# Patient Record
Sex: Female | Born: 1935 | Race: White | Hispanic: No | Smoking: Never smoker
Health system: Southern US, Community
[De-identification: ages and names within clinical notes are randomized; demographics above are authoritative.]

## PROBLEM LIST (undated history)

## (undated) DIAGNOSIS — K5730 Diverticulosis of large intestine without perforation or abscess without bleeding: Secondary | ICD-10-CM

## (undated) DIAGNOSIS — N259 Disorder resulting from impaired renal tubular function, unspecified: Secondary | ICD-10-CM

## (undated) DIAGNOSIS — IMO0001 Reserved for inherently not codable concepts without codable children: Secondary | ICD-10-CM

## (undated) DIAGNOSIS — D649 Anemia, unspecified: Secondary | ICD-10-CM

## (undated) DIAGNOSIS — F411 Generalized anxiety disorder: Secondary | ICD-10-CM

## (undated) DIAGNOSIS — M1990 Unspecified osteoarthritis, unspecified site: Secondary | ICD-10-CM

## (undated) DIAGNOSIS — N319 Neuromuscular dysfunction of bladder, unspecified: Secondary | ICD-10-CM

## (undated) DIAGNOSIS — Z86718 Personal history of other venous thrombosis and embolism: Secondary | ICD-10-CM

## (undated) DIAGNOSIS — Z8672 Personal history of thrombophlebitis: Secondary | ICD-10-CM

## (undated) DIAGNOSIS — I739 Peripheral vascular disease, unspecified: Secondary | ICD-10-CM

## (undated) DIAGNOSIS — K219 Gastro-esophageal reflux disease without esophagitis: Secondary | ICD-10-CM

## (undated) DIAGNOSIS — M899 Disorder of bone, unspecified: Secondary | ICD-10-CM

## (undated) DIAGNOSIS — C801 Malignant (primary) neoplasm, unspecified: Secondary | ICD-10-CM

## (undated) DIAGNOSIS — I10 Essential (primary) hypertension: Secondary | ICD-10-CM

## (undated) DIAGNOSIS — K222 Esophageal obstruction: Secondary | ICD-10-CM

## (undated) DIAGNOSIS — K589 Irritable bowel syndrome without diarrhea: Secondary | ICD-10-CM

## (undated) DIAGNOSIS — D759 Disease of blood and blood-forming organs, unspecified: Secondary | ICD-10-CM

## (undated) DIAGNOSIS — K449 Diaphragmatic hernia without obstruction or gangrene: Secondary | ICD-10-CM

## (undated) DIAGNOSIS — I7300 Raynaud's syndrome without gangrene: Secondary | ICD-10-CM

## (undated) DIAGNOSIS — J45909 Unspecified asthma, uncomplicated: Secondary | ICD-10-CM

## (undated) DIAGNOSIS — M949 Disorder of cartilage, unspecified: Secondary | ICD-10-CM

## (undated) DIAGNOSIS — C8599 Non-Hodgkin lymphoma, unspecified, extranodal and solid organ sites: Secondary | ICD-10-CM

## (undated) DIAGNOSIS — E7800 Pure hypercholesterolemia, unspecified: Secondary | ICD-10-CM

## (undated) DIAGNOSIS — M5030 Other cervical disc degeneration, unspecified cervical region: Secondary | ICD-10-CM

## (undated) DIAGNOSIS — D682 Hereditary deficiency of other clotting factors: Secondary | ICD-10-CM

## (undated) HISTORY — DX: Esophageal obstruction: K22.2

## (undated) HISTORY — PX: TONSILLECTOMY: SUR1361

## (undated) HISTORY — DX: Personal history of thrombophlebitis: Z86.72

## (undated) HISTORY — DX: Pure hypercholesterolemia, unspecified: E78.00

## (undated) HISTORY — DX: Irritable bowel syndrome, unspecified: K58.9

## (undated) HISTORY — DX: Essential (primary) hypertension: I10

## (undated) HISTORY — DX: Hereditary deficiency of other clotting factors: D68.2

## (undated) HISTORY — DX: Personal history of other venous thrombosis and embolism: Z86.718

## (undated) HISTORY — PX: CATARACT EXTRACTION: SUR2

## (undated) HISTORY — DX: Disorder resulting from impaired renal tubular function, unspecified: N25.9

## (undated) HISTORY — DX: Diaphragmatic hernia without obstruction or gangrene: K44.9

## (undated) HISTORY — DX: Diverticulosis of large intestine without perforation or abscess without bleeding: K57.30

## (undated) HISTORY — DX: Raynaud's syndrome without gangrene: I73.00

## (undated) HISTORY — DX: Unspecified osteoarthritis, unspecified site: M19.90

## (undated) HISTORY — DX: Disorder of cartilage, unspecified: M94.9

## (undated) HISTORY — PX: OTHER SURGICAL HISTORY: SHX169

## (undated) HISTORY — DX: Unspecified asthma, uncomplicated: J45.909

## (undated) HISTORY — PX: EYE SURGERY: SHX253

## (undated) HISTORY — DX: Other cervical disc degeneration, unspecified cervical region: M50.30

## (undated) HISTORY — DX: Anemia, unspecified: D64.9

## (undated) HISTORY — PX: CHOLECYSTECTOMY: SHX55

## (undated) HISTORY — DX: Neuromuscular dysfunction of bladder, unspecified: N31.9

## (undated) HISTORY — DX: Gastro-esophageal reflux disease without esophagitis: K21.9

## (undated) HISTORY — DX: Generalized anxiety disorder: F41.1

## (undated) HISTORY — DX: Disorder of bone, unspecified: M89.9

---

## 1997-11-09 ENCOUNTER — Ambulatory Visit (HOSPITAL_COMMUNITY): Admission: RE | Admit: 1997-11-09 | Discharge: 1997-11-09 | Payer: Self-pay | Admitting: Surgery

## 1998-02-08 ENCOUNTER — Other Ambulatory Visit: Admission: RE | Admit: 1998-02-08 | Discharge: 1998-02-08 | Payer: Self-pay | Admitting: Obstetrics & Gynecology

## 1998-03-30 HISTORY — PX: COLONOSCOPY: SHX174

## 1998-08-01 ENCOUNTER — Ambulatory Visit (HOSPITAL_COMMUNITY): Admission: RE | Admit: 1998-08-01 | Discharge: 1998-08-01 | Payer: Self-pay | Admitting: Otolaryngology

## 1999-01-13 DIAGNOSIS — K5730 Diverticulosis of large intestine without perforation or abscess without bleeding: Secondary | ICD-10-CM

## 1999-01-13 HISTORY — DX: Diverticulosis of large intestine without perforation or abscess without bleeding: K57.30

## 1999-02-07 ENCOUNTER — Other Ambulatory Visit: Admission: RE | Admit: 1999-02-07 | Discharge: 1999-02-07 | Payer: Self-pay | Admitting: Obstetrics & Gynecology

## 1999-11-20 ENCOUNTER — Encounter: Payer: Self-pay | Admitting: Obstetrics & Gynecology

## 1999-11-20 ENCOUNTER — Encounter: Admission: RE | Admit: 1999-11-20 | Discharge: 1999-11-20 | Payer: Self-pay | Admitting: Obstetrics & Gynecology

## 2000-03-15 ENCOUNTER — Other Ambulatory Visit: Admission: RE | Admit: 2000-03-15 | Discharge: 2000-03-15 | Payer: Self-pay | Admitting: Obstetrics & Gynecology

## 2001-04-12 ENCOUNTER — Other Ambulatory Visit: Admission: RE | Admit: 2001-04-12 | Discharge: 2001-04-12 | Payer: Self-pay | Admitting: Obstetrics & Gynecology

## 2002-05-08 ENCOUNTER — Other Ambulatory Visit: Admission: RE | Admit: 2002-05-08 | Discharge: 2002-05-08 | Payer: Self-pay | Admitting: Obstetrics & Gynecology

## 2002-08-14 ENCOUNTER — Encounter: Payer: Self-pay | Admitting: Internal Medicine

## 2002-08-14 ENCOUNTER — Ambulatory Visit (HOSPITAL_COMMUNITY): Admission: RE | Admit: 2002-08-14 | Discharge: 2002-08-14 | Payer: Self-pay | Admitting: Internal Medicine

## 2003-06-26 ENCOUNTER — Ambulatory Visit (HOSPITAL_COMMUNITY): Admission: RE | Admit: 2003-06-26 | Discharge: 2003-06-26 | Payer: Self-pay | Admitting: Gastroenterology

## 2003-06-26 ENCOUNTER — Encounter: Payer: Self-pay | Admitting: Gastroenterology

## 2003-07-09 ENCOUNTER — Inpatient Hospital Stay (HOSPITAL_COMMUNITY): Admission: RE | Admit: 2003-07-09 | Discharge: 2003-07-13 | Payer: Self-pay | Admitting: Internal Medicine

## 2004-04-23 ENCOUNTER — Ambulatory Visit: Payer: Self-pay | Admitting: Pulmonary Disease

## 2004-06-18 ENCOUNTER — Other Ambulatory Visit: Admission: RE | Admit: 2004-06-18 | Discharge: 2004-06-18 | Payer: Self-pay | Admitting: Obstetrics & Gynecology

## 2004-07-28 ENCOUNTER — Ambulatory Visit (HOSPITAL_COMMUNITY): Admission: RE | Admit: 2004-07-28 | Discharge: 2004-07-28 | Payer: Self-pay | Admitting: Neurosurgery

## 2004-08-27 ENCOUNTER — Ambulatory Visit: Payer: Self-pay | Admitting: Pulmonary Disease

## 2004-09-04 ENCOUNTER — Ambulatory Visit: Payer: Self-pay | Admitting: Gastroenterology

## 2004-09-16 ENCOUNTER — Ambulatory Visit: Payer: Self-pay

## 2004-09-17 ENCOUNTER — Ambulatory Visit (HOSPITAL_COMMUNITY): Admission: RE | Admit: 2004-09-17 | Discharge: 2004-09-17 | Payer: Self-pay | Admitting: Gastroenterology

## 2004-09-17 ENCOUNTER — Ambulatory Visit: Payer: Self-pay | Admitting: Gastroenterology

## 2004-09-23 ENCOUNTER — Ambulatory Visit: Payer: Self-pay | Admitting: Pulmonary Disease

## 2004-10-06 ENCOUNTER — Ambulatory Visit: Payer: Self-pay | Admitting: Pulmonary Disease

## 2004-10-08 ENCOUNTER — Ambulatory Visit: Payer: Self-pay | Admitting: Internal Medicine

## 2004-10-08 ENCOUNTER — Observation Stay (HOSPITAL_COMMUNITY): Admission: EM | Admit: 2004-10-08 | Discharge: 2004-10-10 | Payer: Self-pay | Admitting: Emergency Medicine

## 2004-10-14 ENCOUNTER — Ambulatory Visit: Payer: Self-pay | Admitting: Gastroenterology

## 2004-10-24 ENCOUNTER — Ambulatory Visit: Payer: Self-pay | Admitting: Gastroenterology

## 2004-10-28 ENCOUNTER — Ambulatory Visit: Payer: Self-pay | Admitting: Internal Medicine

## 2004-10-30 ENCOUNTER — Ambulatory Visit: Payer: Self-pay | Admitting: Internal Medicine

## 2004-11-10 ENCOUNTER — Ambulatory Visit: Payer: Self-pay | Admitting: Pulmonary Disease

## 2004-11-11 ENCOUNTER — Ambulatory Visit: Payer: Self-pay | Admitting: Pulmonary Disease

## 2004-12-08 ENCOUNTER — Ambulatory Visit: Payer: Self-pay | Admitting: Pulmonary Disease

## 2004-12-10 ENCOUNTER — Ambulatory Visit: Payer: Self-pay | Admitting: Cardiology

## 2004-12-10 ENCOUNTER — Inpatient Hospital Stay (HOSPITAL_COMMUNITY): Admission: EM | Admit: 2004-12-10 | Discharge: 2004-12-19 | Payer: Self-pay | Admitting: Emergency Medicine

## 2004-12-11 ENCOUNTER — Ambulatory Visit: Payer: Self-pay | Admitting: Cardiology

## 2004-12-11 ENCOUNTER — Ambulatory Visit: Payer: Self-pay | Admitting: Pulmonary Disease

## 2004-12-11 ENCOUNTER — Encounter: Payer: Self-pay | Admitting: Cardiology

## 2004-12-24 ENCOUNTER — Ambulatory Visit: Payer: Self-pay | Admitting: Pulmonary Disease

## 2005-01-26 ENCOUNTER — Ambulatory Visit: Payer: Self-pay | Admitting: Pulmonary Disease

## 2005-03-11 ENCOUNTER — Ambulatory Visit: Payer: Self-pay | Admitting: Pulmonary Disease

## 2005-05-14 ENCOUNTER — Ambulatory Visit: Payer: Self-pay | Admitting: Pulmonary Disease

## 2005-06-02 ENCOUNTER — Ambulatory Visit: Payer: Self-pay | Admitting: Gastroenterology

## 2005-07-09 ENCOUNTER — Ambulatory Visit: Payer: Self-pay | Admitting: Gastroenterology

## 2005-07-14 ENCOUNTER — Ambulatory Visit: Payer: Self-pay | Admitting: Gastroenterology

## 2005-07-20 ENCOUNTER — Ambulatory Visit: Payer: Self-pay | Admitting: Pulmonary Disease

## 2005-08-11 ENCOUNTER — Ambulatory Visit: Payer: Self-pay | Admitting: Pulmonary Disease

## 2005-08-27 ENCOUNTER — Ambulatory Visit: Payer: Self-pay | Admitting: Gastroenterology

## 2005-09-06 ENCOUNTER — Emergency Department (HOSPITAL_COMMUNITY): Admission: EM | Admit: 2005-09-06 | Discharge: 2005-09-06 | Payer: Self-pay | Admitting: Emergency Medicine

## 2005-09-22 ENCOUNTER — Ambulatory Visit: Payer: Self-pay | Admitting: Gastroenterology

## 2005-10-01 ENCOUNTER — Ambulatory Visit: Payer: Self-pay | Admitting: Gastroenterology

## 2005-11-09 ENCOUNTER — Ambulatory Visit: Payer: Self-pay | Admitting: Pulmonary Disease

## 2005-11-17 ENCOUNTER — Ambulatory Visit: Payer: Self-pay | Admitting: Gastroenterology

## 2006-02-08 ENCOUNTER — Ambulatory Visit: Payer: Self-pay | Admitting: Pulmonary Disease

## 2006-04-26 ENCOUNTER — Ambulatory Visit (HOSPITAL_COMMUNITY): Admission: RE | Admit: 2006-04-26 | Discharge: 2006-04-26 | Payer: Self-pay | Admitting: Internal Medicine

## 2006-05-11 ENCOUNTER — Ambulatory Visit: Payer: Self-pay | Admitting: Pulmonary Disease

## 2006-08-14 ENCOUNTER — Ambulatory Visit (HOSPITAL_COMMUNITY): Admission: RE | Admit: 2006-08-14 | Discharge: 2006-08-14 | Payer: Self-pay | Admitting: Emergency Medicine

## 2006-09-15 ENCOUNTER — Ambulatory Visit: Payer: Self-pay | Admitting: Pulmonary Disease

## 2006-09-15 LAB — CONVERTED CEMR LAB
ALT: 15 units/L (ref 0–40)
AST: 28 units/L (ref 0–37)
Calcium: 9.5 mg/dL (ref 8.4–10.5)
Chloride: 108 meq/L (ref 96–112)
Cholesterol: 165 mg/dL (ref 0–200)
Eosinophils Absolute: 0 10*3/uL (ref 0.0–0.6)
GFR calc Af Amer: 37 mL/min
Glucose, Bld: 112 mg/dL — ABNORMAL HIGH (ref 70–99)
HCT: 33 % — ABNORMAL LOW (ref 36.0–46.0)
LDL Cholesterol: 97 mg/dL (ref 0–99)
Monocytes Relative: 7.9 % (ref 3.0–11.0)
Neutro Abs: 3.1 10*3/uL (ref 1.4–7.7)
Neutrophils Relative %: 67.7 % (ref 43.0–77.0)
Platelets: 301 10*3/uL (ref 150–400)
RDW: 14.3 % (ref 11.5–14.6)
Total Bilirubin: 0.4 mg/dL (ref 0.3–1.2)
Total CHOL/HDL Ratio: 3.4
Triglycerides: 94 mg/dL (ref 0–149)
Uric Acid, Serum: 4.7 mg/dL (ref 2.4–7.0)
WBC: 4.4 10*3/uL — ABNORMAL LOW (ref 4.5–10.5)

## 2006-12-22 ENCOUNTER — Ambulatory Visit (HOSPITAL_COMMUNITY): Admission: RE | Admit: 2006-12-22 | Discharge: 2006-12-22 | Payer: Self-pay | Admitting: Internal Medicine

## 2007-01-10 DIAGNOSIS — M1990 Unspecified osteoarthritis, unspecified site: Secondary | ICD-10-CM | POA: Insufficient documentation

## 2007-01-10 DIAGNOSIS — F411 Generalized anxiety disorder: Secondary | ICD-10-CM | POA: Insufficient documentation

## 2007-01-10 DIAGNOSIS — M949 Disorder of cartilage, unspecified: Secondary | ICD-10-CM

## 2007-01-10 DIAGNOSIS — I10 Essential (primary) hypertension: Secondary | ICD-10-CM | POA: Insufficient documentation

## 2007-01-10 DIAGNOSIS — D649 Anemia, unspecified: Secondary | ICD-10-CM | POA: Insufficient documentation

## 2007-01-10 DIAGNOSIS — M899 Disorder of bone, unspecified: Secondary | ICD-10-CM | POA: Insufficient documentation

## 2007-01-10 DIAGNOSIS — K5730 Diverticulosis of large intestine without perforation or abscess without bleeding: Secondary | ICD-10-CM | POA: Insufficient documentation

## 2007-03-14 ENCOUNTER — Ambulatory Visit: Payer: Self-pay | Admitting: Pulmonary Disease

## 2007-03-14 DIAGNOSIS — N319 Neuromuscular dysfunction of bladder, unspecified: Secondary | ICD-10-CM | POA: Insufficient documentation

## 2007-03-14 DIAGNOSIS — Z8672 Personal history of thrombophlebitis: Secondary | ICD-10-CM

## 2007-03-14 DIAGNOSIS — E7800 Pure hypercholesterolemia, unspecified: Secondary | ICD-10-CM

## 2007-03-14 DIAGNOSIS — J45909 Unspecified asthma, uncomplicated: Secondary | ICD-10-CM | POA: Insufficient documentation

## 2007-03-14 DIAGNOSIS — M5030 Other cervical disc degeneration, unspecified cervical region: Secondary | ICD-10-CM

## 2007-04-12 ENCOUNTER — Telehealth: Payer: Self-pay | Admitting: Pulmonary Disease

## 2007-08-17 ENCOUNTER — Encounter: Admission: RE | Admit: 2007-08-17 | Discharge: 2007-08-17 | Payer: Self-pay | Admitting: Obstetrics & Gynecology

## 2007-08-29 ENCOUNTER — Encounter (INDEPENDENT_AMBULATORY_CARE_PROVIDER_SITE_OTHER): Payer: Self-pay | Admitting: Diagnostic Radiology

## 2007-08-29 ENCOUNTER — Encounter: Admission: RE | Admit: 2007-08-29 | Discharge: 2007-08-29 | Payer: Self-pay | Admitting: Obstetrics & Gynecology

## 2007-09-16 ENCOUNTER — Encounter: Payer: Self-pay | Admitting: Pulmonary Disease

## 2007-09-28 ENCOUNTER — Ambulatory Visit: Payer: Self-pay | Admitting: Pulmonary Disease

## 2007-09-29 ENCOUNTER — Ambulatory Visit: Payer: Self-pay | Admitting: Pulmonary Disease

## 2007-09-30 LAB — CONVERTED CEMR LAB
AST: 30 units/L (ref 0–37)
Albumin: 3.5 g/dL (ref 3.5–5.2)
Alkaline Phosphatase: 48 units/L (ref 39–117)
Basophils Absolute: 0 10*3/uL (ref 0.0–0.1)
Basophils Relative: 0.9 % (ref 0.0–1.0)
Eosinophils Absolute: 0.1 10*3/uL (ref 0.0–0.7)
Eosinophils Relative: 1.4 % (ref 0.0–5.0)
GFR calc Af Amer: 37 mL/min
GFR calc non Af Amer: 31 mL/min
HDL: 44.9 mg/dL (ref >39.0)
Hemoglobin: 10.3 g/dL — ABNORMAL LOW (ref 12.0–15.0)
LDL Cholesterol: 83 mg/dL (ref 0–99)
Monocytes Relative: 10.1 % (ref 3.0–12.0)
Platelets: 237 10*3/uL (ref 150–400)
Potassium: 4.8 meq/L (ref 3.5–5.1)
RDW: 14.8 % — ABNORMAL HIGH (ref 11.5–14.6)
Sodium: 139 meq/L (ref 135–145)
TSH: 1.31 microintl units/mL (ref 0.35–5.50)
Total CHOL/HDL Ratio: 3.4
Triglycerides: 123 mg/dL (ref 0–149)

## 2007-10-31 ENCOUNTER — Encounter: Payer: Self-pay | Admitting: Gastroenterology

## 2007-11-25 ENCOUNTER — Telehealth (INDEPENDENT_AMBULATORY_CARE_PROVIDER_SITE_OTHER): Payer: Self-pay | Admitting: *Deleted

## 2007-12-29 ENCOUNTER — Telehealth: Payer: Self-pay | Admitting: Gastroenterology

## 2008-01-24 ENCOUNTER — Ambulatory Visit: Payer: Self-pay | Admitting: Gastroenterology

## 2008-01-24 DIAGNOSIS — K589 Irritable bowel syndrome without diarrhea: Secondary | ICD-10-CM | POA: Insufficient documentation

## 2008-01-24 LAB — CONVERTED CEMR LAB
Iron: 71 ug/dL (ref 42–145)
Tissue Transglutaminase Ab, IgA: 0.5 units (ref <7)
Vitamin B-12: 382 pg/mL (ref 211–911)

## 2008-02-16 ENCOUNTER — Telehealth: Payer: Self-pay | Admitting: Gastroenterology

## 2008-02-29 ENCOUNTER — Encounter: Admission: RE | Admit: 2008-02-29 | Discharge: 2008-02-29 | Payer: Self-pay | Admitting: Obstetrics & Gynecology

## 2008-04-02 ENCOUNTER — Ambulatory Visit: Payer: Self-pay | Admitting: Pulmonary Disease

## 2008-04-02 DIAGNOSIS — N259 Disorder resulting from impaired renal tubular function, unspecified: Secondary | ICD-10-CM | POA: Insufficient documentation

## 2008-07-03 ENCOUNTER — Telehealth (INDEPENDENT_AMBULATORY_CARE_PROVIDER_SITE_OTHER): Payer: Self-pay | Admitting: *Deleted

## 2008-08-21 ENCOUNTER — Encounter: Admission: RE | Admit: 2008-08-21 | Discharge: 2008-08-21 | Payer: Self-pay | Admitting: Obstetrics & Gynecology

## 2008-10-02 ENCOUNTER — Ambulatory Visit: Payer: Self-pay | Admitting: Pulmonary Disease

## 2008-10-03 LAB — CONVERTED CEMR LAB
ALT: 17 units/L (ref 0–35)
AST: 31 units/L (ref 0–37)
Alkaline Phosphatase: 47 units/L (ref 39–117)
Calcium: 9.3 mg/dL (ref 8.4–10.5)
Chloride: 105 meq/L (ref 96–112)
Creatinine, Ser: 1.8 mg/dL — ABNORMAL HIGH (ref 0.4–1.2)
Eosinophils Absolute: 0.1 10*3/uL (ref 0.0–0.7)
HCT: 32.3 % — ABNORMAL LOW (ref 36.0–46.0)
Lymphocytes Relative: 24.5 % (ref 12.0–46.0)
Monocytes Absolute: 0.4 10*3/uL (ref 0.1–1.0)
Monocytes Relative: 7.4 % (ref 3.0–12.0)
Neutro Abs: 3.5 10*3/uL (ref 1.4–7.7)
Platelets: 233 10*3/uL (ref 150.0–400.0)
Potassium: 4.3 meq/L (ref 3.5–5.1)
RBC: 3.43 M/uL — ABNORMAL LOW (ref 3.87–5.11)
RDW: 14 % (ref 11.5–14.6)
Total Bilirubin: 1 mg/dL (ref 0.3–1.2)
Total CHOL/HDL Ratio: 3
Uric Acid, Serum: 4.3 mg/dL (ref 2.4–7.0)
VLDL: 21.4 mg/dL (ref 0.0–40.0)

## 2008-10-29 ENCOUNTER — Telehealth (INDEPENDENT_AMBULATORY_CARE_PROVIDER_SITE_OTHER): Payer: Self-pay | Admitting: *Deleted

## 2008-10-31 ENCOUNTER — Encounter: Payer: Self-pay | Admitting: Pulmonary Disease

## 2008-12-06 ENCOUNTER — Ambulatory Visit: Payer: Self-pay | Admitting: Pulmonary Disease

## 2008-12-08 LAB — CONVERTED CEMR LAB
Calcium: 9.2 mg/dL (ref 8.4–10.5)
GFR calc non Af Amer: 38.39 mL/min (ref >60)
Glucose, Bld: 95 mg/dL (ref 70–99)

## 2009-05-06 ENCOUNTER — Ambulatory Visit: Payer: Self-pay | Admitting: Pulmonary Disease

## 2009-05-07 LAB — CONVERTED CEMR LAB
ALT: 16 units/L (ref 0–35)
AST: 28 units/L (ref 0–37)
Albumin: 4 g/dL (ref 3.5–5.2)
Alkaline Phosphatase: 52 units/L (ref 39–117)
Basophils Relative: 0.6 % (ref 0.0–3.0)
Bilirubin, Direct: 0.2 mg/dL (ref 0.0–0.3)
Calcium: 10 mg/dL (ref 8.4–10.5)
Eosinophils Relative: 1.4 % (ref 0.0–5.0)
Glucose, Bld: 97 mg/dL (ref 70–99)
Hemoglobin: 11.6 g/dL — ABNORMAL LOW (ref 12.0–15.0)
Lymphocytes Relative: 20.8 % (ref 12.0–46.0)
MCHC: 33.6 g/dL (ref 30.0–36.0)
Monocytes Relative: 7.3 % (ref 3.0–12.0)
Neutro Abs: 4.3 10*3/uL (ref 1.4–7.7)
Neutrophils Relative %: 69.9 % (ref 43.0–77.0)
Platelets: 240 10*3/uL (ref 150.0–400.0)
RDW: 14.5 % (ref 11.5–14.6)
Sodium: 140 meq/L (ref 135–145)
Total Bilirubin: 0.7 mg/dL (ref 0.3–1.2)
Total Protein: 7.6 g/dL (ref 6.0–8.3)

## 2009-06-11 ENCOUNTER — Telehealth (INDEPENDENT_AMBULATORY_CARE_PROVIDER_SITE_OTHER): Payer: Self-pay | Admitting: *Deleted

## 2009-06-26 ENCOUNTER — Telehealth: Payer: Self-pay | Admitting: Pulmonary Disease

## 2009-07-01 ENCOUNTER — Telehealth (INDEPENDENT_AMBULATORY_CARE_PROVIDER_SITE_OTHER): Payer: Self-pay | Admitting: *Deleted

## 2009-11-04 ENCOUNTER — Ambulatory Visit: Payer: Self-pay | Admitting: Pulmonary Disease

## 2009-11-10 LAB — CONVERTED CEMR LAB
BUN: 34 mg/dL — ABNORMAL HIGH (ref 6–23)
Basophils Relative: 0.6 % (ref 0.0–3.0)
Chloride: 106 meq/L (ref 96–112)
Creatinine, Ser: 1.5 mg/dL — ABNORMAL HIGH (ref 0.4–1.2)
Eosinophils Absolute: 0.1 10*3/uL (ref 0.0–0.7)
Eosinophils Relative: 1.2 % (ref 0.0–5.0)
GFR calc non Af Amer: 36.21 mL/min (ref >60)
Glucose, Bld: 103 mg/dL — ABNORMAL HIGH (ref 70–99)
HCT: 35.4 % — ABNORMAL LOW (ref 36.0–46.0)
HDL: 54.7 mg/dL (ref >39.00)
Lymphocytes Relative: 22.1 % (ref 12.0–46.0)
Lymphs Abs: 1.2 10*3/uL (ref 0.7–4.0)
MCV: 95.7 fL (ref 78.0–100.0)
Monocytes Absolute: 0.4 10*3/uL (ref 0.1–1.0)
Monocytes Relative: 8.3 % (ref 3.0–12.0)
Neutrophils Relative %: 67.8 % (ref 43.0–77.0)
Potassium: 4.7 meq/L (ref 3.5–5.1)
Total CHOL/HDL Ratio: 3
Triglycerides: 102 mg/dL (ref 0.0–149.0)
Uric Acid, Serum: 3.8 mg/dL (ref 2.4–7.0)
VLDL: 20.4 mg/dL (ref 0.0–40.0)
Vitamin B-12: 518 pg/mL (ref 211–911)

## 2009-11-11 ENCOUNTER — Telehealth (INDEPENDENT_AMBULATORY_CARE_PROVIDER_SITE_OTHER): Payer: Self-pay | Admitting: *Deleted

## 2009-11-27 ENCOUNTER — Encounter: Payer: Self-pay | Admitting: Pulmonary Disease

## 2010-02-24 ENCOUNTER — Telehealth: Payer: Self-pay | Admitting: Gastroenterology

## 2010-03-11 ENCOUNTER — Encounter (INDEPENDENT_AMBULATORY_CARE_PROVIDER_SITE_OTHER): Payer: Self-pay | Admitting: *Deleted

## 2010-03-11 ENCOUNTER — Ambulatory Visit: Payer: Self-pay | Admitting: Gastroenterology

## 2010-03-11 DIAGNOSIS — R1310 Dysphagia, unspecified: Secondary | ICD-10-CM | POA: Insufficient documentation

## 2010-03-11 LAB — CONVERTED CEMR LAB
Eosinophils Absolute: 0.1 10*3/uL (ref 0.0–0.7)
Ferritin: 33.5 ng/mL (ref 10.0–291.0)
Folate: >20 ng/mL
Lymphocytes Relative: 21.8 % (ref 12.0–46.0)
Lymphs Abs: 1.4 10*3/uL (ref 0.7–4.0)
MCHC: 34.4 g/dL (ref 30.0–36.0)
MCV: 95.3 fL (ref 78.0–100.0)
Monocytes Absolute: 0.5 10*3/uL (ref 0.1–1.0)
Monocytes Relative: 8.5 % (ref 3.0–12.0)
Neutro Abs: 4.3 10*3/uL (ref 1.4–7.7)
Neutrophils Relative %: 68 % (ref 43.0–77.0)
Platelets: 255 10*3/uL (ref 150.0–400.0)
Saturation Ratios: 15.8 % — ABNORMAL LOW (ref 20.0–50.0)
Sed Rate: 19 mm/hr (ref 0–22)

## 2010-03-12 ENCOUNTER — Telehealth: Payer: Self-pay | Admitting: Pulmonary Disease

## 2010-03-18 ENCOUNTER — Telehealth: Payer: Self-pay | Admitting: Gastroenterology

## 2010-03-18 ENCOUNTER — Telehealth (INDEPENDENT_AMBULATORY_CARE_PROVIDER_SITE_OTHER): Payer: Self-pay | Admitting: *Deleted

## 2010-03-30 HISTORY — PX: ESOPHAGOGASTRODUODENOSCOPY: SHX1529

## 2010-04-09 ENCOUNTER — Encounter: Payer: Self-pay | Admitting: Gastroenterology

## 2010-04-09 ENCOUNTER — Ambulatory Visit
Admission: RE | Admit: 2010-04-09 | Discharge: 2010-04-09 | Payer: Self-pay | Source: Home / Self Care | Attending: Gastroenterology | Admitting: Gastroenterology

## 2010-04-09 DIAGNOSIS — K449 Diaphragmatic hernia without obstruction or gangrene: Secondary | ICD-10-CM

## 2010-04-09 HISTORY — DX: Diaphragmatic hernia without obstruction or gangrene: K44.9

## 2010-05-01 NOTE — Procedures (Signed)
Summary: Upper Endoscopy  Patient: Nancy Hicks Note: All result statuses are Final unless otherwise noted.  Tests: (1) Upper Endoscopy (EGD)   EGD Upper Endoscopy       DONE     [facility]     [address]     [place], [place]  [address]           ENDOSCOPY PROCEDURE REPORT           PATIENT:  [name], [name]  MR#:  [id number]     BIRTHDATE:  10-15-1926, 84 yrs. old  GENDER:  female           ENDOSCOPIST:  Vania Rea. Jarold Motto, MD, [facility]     Referred by:  Alroy Dust, M.D.           PROCEDURE DATE:  04/09/2010     PROCEDURE:  EGD, diagnostic 43235     ASA CLASS:  Class III     INDICATIONS:  dysphagia           MEDICATIONS:   Fentanyl 50 mcg IV, Versed 6 mg IV     TOPICAL ANESTHETIC:           DESCRIPTION OF PROCEDURE:   After the risks benefits and     alternatives of the procedure were thoroughly explained, informed     consent was obtained.  The LB GIF-H180 [id number] endoscope was     introduced through the mouth and advanced to the second portion of     the duodenum, without limitations.  The instrument was slowly     withdrawn as the mucosa was fully examined.     <<PROCEDUREIMAGES>>           ULTRASONIC FINDINGS:   There were multiple polyps identified. -3     MM FLAT HYPERPLASTIC POLYPS NOTED. A hiatal hernia was found.     LARGE PROLAPSING HH AND CAMERON EROSIONS AND BENIGN FUNDAL POLYPS.     Plaques were found. DFENSE WHITE CANDIDA PLAQUES IN DISTAL     2/3.ALSO "INLET POUCH" NOTED IN PROXIMAL ESOPHAGUS.  Multiple     erosions were found. LINEAR EROSIONS IN HERNIA SAC.  The duodenal     bulb was normal in appearance, as was the postbulbar duodenum.     Retroflexed views revealed a hiatal hernia.    The scope was then     withdrawn from the patient and the procedure completed.           COMPLICATIONS:  None           ENDOSCOPIC IMPRESSION:     1) Hiatal hernia     2) Polyps, multiple     3) Plaques     4) Erosions, multiple     5) Normal  duodenum     6) A hiatal hernia     PRIMARY PROBLEM IS CANDIDA ESOPHAGITIS.     RECOMMENDATIONS:     DIFLUCAN RX.CONTINUE PPI ALSO.DECREASE NEXIUM TO 1 A DAY.           REPEAT EXAM:  No           ______________________________     Vania Rea. Jarold Motto, MD, [facility]           CC:  Alroy Dust, M.D.           n.     eSIGNED:   Vania Rea. Asaiah Scarber at 04/09/2010 03:30 PM           Macarthur Critchley, [id number]  Note: An exclamation mark (!) indicates a result that was not dispersed into the flowsheet. Document Creation Date: 04/09/2010 3:30 PM _______________________________________________________________________  (1) Order result status: Final Collection or observation date-time: 04/09/2010 15:18 Requested date-time:  Receipt date-time:  Reported date-time:  Referring Physician:   Ordering Physician: Sheryn Bison [phone]) Specimen Source:  Source: Launa Grill Order Number: [id number] Lab site:

## 2010-05-01 NOTE — Letter (Signed)
Summary: EGD Instructions  [facility]  [address] [place], [place] [address]   Phone: [phone]  Fax: [phone]       Nancy Hicks    07-31-1926    MRN: [id number]       Procedure Day Dorna Bloom: Wednesday 04/09/2010     Arrival Time: 1:30pm     Procedure Time: 2:30pm     Location of Procedure:                    [facility] (4th Floor)   PREPARATION FOR ENDOSCOPY  On 04/09/2010 THE DAY OF THE PROCEDURE:  1.   No solid foods, milk or milk products are allowed after midnight the night before your procedure.  2.   Do not drink anything colored red or purple.  Avoid juices with pulp.  No orange juice.  3.  You may drink clear liquids until 12:30pm, which is 2 hours before your procedure.                                                                                                CLEAR LIQUIDS INCLUDE: Water Jello Ice Popsicles Tea (sugar ok, no milk/cream) Powdered fruit flavored drinks Coffee (sugar ok, no milk/cream) Gatorade Juice: apple, white grape, white cranberry  Lemonade Clear bullion, consomm, broth Carbonated beverages (any kind) Strained chicken noodle soup Hard Candy   MEDICATION INSTRUCTIONS  Unless otherwise instructed, you should take regular prescription medications with a small sip of water as early as possible the morning of your procedure.  Remain on your Coumadin for your procedure, per Dr. Sheryn Bison                OTHER INSTRUCTIONS  You will need a responsible adult at least 75 years of age to accompany you and drive you home.   This person must remain in the waiting room during your procedure.  Wear loose fitting clothing that is easily removed.  Leave jewelry and other valuables at home.  However, you may wish to bring a book to read or an iPod/MP3 player to listen to music as you wait for your procedure to start.  Remove all body piercing jewelry and leave at home.  Total time from  sign-in until discharge is approximately 2-3 hours.  You should go home directly after your procedure and rest.  You can resume normal activities the day after your procedure.  The day of your procedure you should not:   Drive   Make legal decisions   Operate machinery   Drink alcohol   Return to work  You will receive specific instructions about eating, activities and medications before you leave.    The above instructions have been reviewed and explained to me by   _______________________    I fully understand and can verbalize these instructions _____________________________ Date _________

## 2010-05-01 NOTE — Progress Notes (Signed)
Summary: Clarify prescription  Phone Note From Pharmacy Call back at [phone]   Caller: Berneda Rose Call For: nadel  Summary of Call: Berneda Rose called from patient's pharmacy to clarify prescription for furosemide.  Please call back, use ref # [id number], by 2:00 PM on December 22.   Initial call taken by: Leonette Monarch,  March 18, 2010 12:44 PM  Follow-up for Phone Call        pt called as well re: same. pls call her to confirm this has been taken care of. Tivis Ringer, CNA  March 18, 2010 1:07 PM  Spoke with [facility] and clarified that the lasix should be given in a quantity of 45 and not 90 since pt only takes 1/2 once daily.  Pharmacist verbalized understanding. Spoke with pt and notified that this was taken care of.  Follow-up by: Vernie Murders,  March 18, 2010 2:51 PM

## 2010-05-01 NOTE — Assessment & Plan Note (Signed)
Summary: refill Nexium...as.    History of Present Illness Visit Type: Follow-up Visit Primary GI MD: Sheryn Bison MD FACP FAGA Primary Jaxyn Rout: Alroy Dust MD Chief Complaint: Nexium refills, patient has had GERD flare for 3 weeks, however symptoms have subsided after she takes Nexium tid History of Present Illness:   Very Complex 77 year old Caucasian female with factor V clotting disorder, recurrent DVTs, and chronically on Coumadin therapy. She also has a history of mild pulmonary hypertension, pulmonary emboli, degenerative joint disease, and chronic acid reflux and diverticulosis coli.  She has had worsening congestion and substernal discomfort with typical reflux symptoms despite her taking Nexium 40 mg t.i.d. She has Sjogren's syndrome with severely dry mouth and eyes which contributes to her reflux symptoms. Her chronic constipation is managed with daily Citrucel and probiotics.  She also describes intermittent dysphagia, but no painful swallowing or hepatobiliary complaints. She has had previous cholecystectomy and also some vague surgery to her salivary glands. She has chronic depression treated with Zoloft, and Advair inhalers for her COPD. Despite these complaints she's had no significant anorexia or weight loss. Recent Hemoccult cards were apparently negative and Dr. Rudi Heap office.   GI Review of Systems    Reports acid reflux, belching, bloating, and  heartburn.      Denies abdominal pain, chest pain, dysphagia with liquids, dysphagia with solids, loss of appetite, nausea, vomiting, vomiting blood, weight loss, and  weight gain.      Reports diverticulosis.     Denies anal fissure, black tarry stools, change in bowel habit, constipation, diarrhea, fecal incontinence, heme positive stool, hemorrhoids, irritable bowel syndrome, jaundice, light color stool, liver problems, rectal bleeding, and  rectal pain.    Current Medications (verified): 1)  Flonase 50 Mcg/act   Susp (Fluticasone Propionate) .... 2 Sprays Each Nostril Once Daily 2)  Advair Diskus 100-50 Mcg/dose  Misc (Fluticasone-Salmeterol) .Marland Kitchen.. 1 Inhalation Two Times A Day... 3)  Warfarin Sodium 2.5 Mg  Tabs (Warfarin Sodium) .... Take As Directed 4)  Diovan 160 Mg  Tabs (Valsartan) .Marland Kitchen.. 1 By Mouth  Every Morning 5)  Dilt-Xr 240 Mg  Cp24 (Diltiazem Hcl) .Marland Kitchen.. 1 By Mouth Once Daily 6)  Furosemide 40 Mg  Tabs (Furosemide) .... Take 1/2 Tab By Mouth Once Daily.Marland KitchenMarland Kitchen 7)  Klor-Con M20 20 Meq  Tbcr (Potassium Chloride Crys Cr) .Marland Kitchen.. 1 By Mouth Once Daily 8)  Simvastatin 10 Mg Tabs (Simvastatin) .... Take 1 Tablet By Mouth At Bedtime 9)  Fenofibrate 160 Mg  Tabs (Fenofibrate) .... Take 1 Tab By Mouth Once Daily... 10)  Nexium 40 Mg  Cpdr (Esomeprazole Magnesium) .Marland Kitchen.. 1 By Mouth Three Times A Day..must Have Office Visit Before Any More Refills 11)  Citrucel   Powd (Methylcellulose (Laxative)) .Marland Kitchen.. 1 Tsp Two Times A Day 12)  Mandelamine 0.5 Gm  Tabs (Methenamine Mandelate) .Marland Kitchen.. 1 By Mouth Two Times A Day 13)  Allopurinol 300 Mg  Tabs (Allopurinol) .Marland Kitchen.. 1 By Mouth Once Daily 14)  Caltrate 600+d Plus 600-400 Mg-Unit  Chew (Calcium Carbonate-Vit D-Min) .Marland Kitchen.. 1 By Mouth Once Daily 15)  Therapeutic Multivitamin   Tabs (Multiple Vitamin) .Marland Kitchen.. 1 By Mouth Once Daily 16)  Super Biotin 5000 Mcg Tabs (Biotin) .... Take 1 Tablet By Mouth Once A Day 17)  Vitamin D3 2000 Unit Caps (Cholecalciferol) .... Take 1 Tablet By Mouth Once A Day 18)  Zoloft 100 Mg  Tabs (Sertraline Hcl) .... 1/2 By Mouth Once Daily  Allergies (verified): 1)  Macrobid 2)  Reglan (Metoclopramide  Hcl)  Past History:  Past medical, surgical, family and social histories (including risk factors) reviewed for relevance to current acute and chronic problems.  Past Medical History: Reviewed history from 11/04/2009 and no changes required. ASTHMA (ICD-493.90) PULMONARY EMBOLISM, HX OF (ICD-V12.51) HYPERTENSION (ICD-401.9) DEEP VENOUS THROMBOPHLEBITIS,  HX OF (ICD-V12.52) HYPERCHOLESTEROLEMIA (ICD-272.0) GASTROESOPHAGEAL REFLUX DISEASE, SEVERE (ICD-530.81) DIVERTICULOSIS, COLON (ICD-562.10) IBS (ICD-564.1) RENAL INSUFFICIENCY (ICD-588.9) NEUROGENIC BLADDER (ICD-596.54) DEGENERATIVE JOINT DISEASE (ICD-715.90) DISC DISEASE, CERVICAL (ICD-722.4) OSTEOPENIA (ICD-733.90) ANXIETY (ICD-300.00) ANEMIA (ICD-285.9) FACTOR V DEFICIENCY (ICD-286.3)  Past Surgical History: Cholecystectomy Salivery gland tumor removed  Family History: Reviewed history from 11/04/2009 and no changes required. mother died age 45 from parkinson's disease 2 siblings: 1 brother alive age 46  hx of copd, asthma, lung cancer 1 brother alive age 80  hx of asthma and heart attack  Social History: Reviewed history from 11/04/2009 and no changes required. retired widowed 2 children nonsmoker  no alcohol  Review of Systems       The patient complains of chest pain and dyspnea on exertion.  The patient denies anorexia, fever, weight loss, weight gain, vision loss, decreased hearing, hoarseness, syncope, peripheral edema, prolonged cough, headaches, hemoptysis, abdominal pain, melena, hematochezia, severe indigestion/heartburn, hematuria, incontinence, genital sores, muscle weakness, suspicious skin lesions, transient blindness, difficulty walking, depression, unusual weight change, abnormal bleeding, enlarged lymph nodes, angioedema, breast masses, and testicular masses.    Vital Signs:  Patient profile:   77 year old female Height:      60 inches Weight:      142.13 pounds BMI:     27.86 Pulse rate:   100 / minute Pulse rhythm:   regular BP sitting:   124 / 72  (left arm) Cuff size:   regular  Vitals Entered By: June McMurray CMA Duncan Dull) (March 11, 2010 10:48 AM)   Physical Exam  General:  Well developed, well nourished, no acute distress.healthy appearing.   Head:  Normocephalic and atraumatic. Eyes:  PERRLA, no icterus.exam deferred to patient's  ophthalmologist.   Lungs:  Clear throughout to auscultation.decreased BS on L and decreased BS on R.   Heart:  Regular rate and rhythm; no murmurs, rubs,  or bruits. Abdomen:  Soft, nontender and nondistended. No masses, hepatosplenomegaly or hernias noted. Normal bowel sounds. Extremities:  No clubbing, cyanosis, edema or deformities noted.trace pedal edema.   Neurologic:  Alert and  oriented x4;  grossly normal neurologically. Cervical Nodes:  No significant cervical adenopathy. Psych:  Alert and cooperative. Normal mood and affect.   Impression & Recommendations:  Problem # 1:  OTHER DYSPHAGIA (ICD-787.29) Assessment Deteriorated Her chronic GERD is exacerbated her Sjogren's syndrome and inability to produce saliva. She has had previous esophageal manometry which has not shown any changes of scleroderma or other motility disturbances. I have scheduled her for followup endoscopy which has not been done in several years, and will consider esophageal dilatation. I have not stopped her anticoagulation therapy for this procedure, and she is tolerating this in the past while on Coumadin. Orders: TLB-B12, Serum-Total ONLY ([address]-U04) TLB-Ferritin (82728-FER) TLB-Folic Acid (Folate) (82746-FOL) TLB-IBC Pnl (Iron/FE;Transferrin) (83550-IBC) TLB-CBC Platelet - w/Differential (85025-CBCD) TLB-Sedimentation Rate (ESR) (85652-ESR) EGD (EGD)  Problem # 2:  PULMONARY EMBOLISM, HX OF (ICD-V12.51) Assessment: Comment Only  Problem # 3:  DEEP VENOUS THROMBOPHLEBITIS, HX OF (ICD-V12.52) Assessment: Comment Only  Problem # 4:  GASTROESOPHAGEAL REFLUX DISEASE, SEVERE (ICD-530.81) Assessment: Unchanged  Orders: TLB-B12, Serum-Total ONLY (54098-J19) TLB-Ferritin (82728-FER) TLB-Folic Acid (Folate) (82746-FOL) TLB-IBC Pnl (Iron/FE;Transferrin) (83550-IBC) TLB-CBC Platelet -  w/Differential (85025-CBCD) TLB-Sedimentation Rate (ESR) (85652-ESR) EGD (EGD)  Patient Instructions: 1)  Copy sent to :  Alroy Dust MD 2)  Please go to the basement today for your labs.  3)  Your procedure has been scheduled for 04/09/2010, please follow the seperate instructions.  4)  Please remain on your coumadin for the Endoscopy. 5)  [facility] Patient Information Guide given to patient.  6)  Upper Endoscopy brochure given.  7)  The medication list was reviewed and reconciled.  All changed / newly prescribed medications were explained.  A complete medication list was provided to the patient / caregiver.

## 2010-05-01 NOTE — Progress Notes (Signed)
Summary: prescript  Phone Note Call from Patient   Caller: Patient Call For: nadel Summary of Call: pt have questions about mylicon and phazyne prescripts Initial call taken by: Rickard Patience,  June 11, 2009 2:38 PM  Follow-up for Phone Call        called and spoke with pt.  pt states she picked up Phazyne and Mylicon OTC but needed SN's directions on how to take.  I informed her to take as directed on the box.  Pt states the directions on the Mylicon is confusing.  It states "take 4 times daily"  Pt unsure if that means 1 drop four times a day or how many drops.  Please advise.  Thank you.  Aundra Millet Reynolds LPN  June 11, 2009 3:26 PM   Additional Follow-up for Phone Call Additional follow up Details #1::        per SN---for the mylicon is one dropper ful four times daily and the phazyme is 1 pill four times daily then titrate down as the gas is controlled.  thanks Randell Loop CMA  June 11, 2009 4:05 PM     Additional Follow-up for Phone Call Additional follow up Details #2::    called and spoke with pt. pt aware of SN's recs  Arman Filter LPN  June 11, 2009 4:08 PM

## 2010-05-01 NOTE — Progress Notes (Signed)
Summary: reaction -- LMTCB x 1  Phone Note Call from Patient Call back at [facility] Phone [phone]   Caller: Patient Call For: nadel Summary of Call: pt have swollen arm with redness from whooping cough vaccine. she can't be reached after 12:00 noon Initial call taken by: Rickard Patience,  November 11, 2009 10:06 AM  Follow-up for Phone Call        Pt c/o left upper arm swelling, knot, itching, redness, soreness, T-101 after receiving "whooping cough shot last Monday". Pt checked temp while on telephone and was T-98.6. Pt states the knot, soreness, itching, and fever are gone and remain with redness and swelling. I advised pt that this is a local reaction but will inform SN for his recs. Please advise. Thanks. Zackery Barefoot CMA  November 11, 2009 10:20 AM   Additional Follow-up for Phone Call Additional follow up Details #1::        per SN---ok for pt to take benadryl 25mg    every 6 hours as needed ,  cool compresses, ok to use cortisone to area as needed .  thanks Randell Loop CMA  November 11, 2009 2:25 PM     Additional Follow-up for Phone Call Additional follow up Details #2::    [facility] Gweneth Dimitri RN  November 11, 2009 2:32 PM  Returning call.Darletta Moll  November 12, 2009 8:47 AM'  called and spoke with pt.  pt aware of Sn's recs.  pt verbalized understanding and denied any questions.  nothing further needed. Arman Filter LPN  November 12, 2009 9:07 AM

## 2010-05-01 NOTE — Progress Notes (Signed)
Summary: refill "different" from usual  Phone Note Call from Patient Call back at [facility] Phone [phone]   Caller: Patient Call For: nadel Summary of Call: pt states that [facility] called her and asked that she call dr Kriste Basque "since there is a "slight" difference in the DILTIAZEM refill".  Initial call taken by: Tivis Ringer, CNA,  July 01, 2009 12:58 PM  Follow-up for Phone Call        pt states she received her rx from [facility] for Diltiazem and it is slightly different then what she had been taking. Pt states she was taking diltiazem er 240mg  and rx was for dilt-xr 240mg  Cp24 which was on her med list. Pt states she was told by [facility] pharmacists that there was a difference in the way the tablets released inthe body. Pt just wnated to check with SN to see if this was the correct medication for her. Please advise. Carron Curie CMA  July 01, 2009 3:22 PM   Additional Follow-up for Phone Call Additional follow up Details #1::        per SN---it is all the same  as long as her BP and HR are all stable---SN rec cont the diltiazem xr 240 1 daily---is there any difference in cost?  thanks Randell Loop CMA  July 02, 2009 9:16 AM     Additional Follow-up for Phone Call Additional follow up Details #2::    called and spoke with pt.  pt aware of SN's recs to continue on Diltiazem XR 240mg  1 daily.  Pt was unaware of any cost differences in the two meds.  Aundra Millet Reynolds LPN  July 02, 6960 9:35 AM

## 2010-05-01 NOTE — Progress Notes (Signed)
Summary: refill on lasix  Phone Note Call from Patient Call back at [facility] Phone [phone]   Caller: Patient Call For: Elick Aguilera Reason for Call: Refill Medication Summary of Call: Patient needs refill--furosemide--[facility] [phone] Initial call taken by: Lehman Prom,  March 12, 2010 11:07 AM  Follow-up for Phone Call        spoke with pt and she request refill on lasix refill sent pt aware.Carron Curie CMA  March 12, 2010 12:09 PM     Prescriptions: FUROSEMIDE 40 MG  TABS (FUROSEMIDE) take 1/2 tab by mouth once daily...  #45 x 3   Entered by:   Carron Curie CMA   Authorized by:   Michele Mcalpine MD   Signed by:   Carron Curie CMA on 03/12/2010   Method used:   Faxed to ...       [facility] (mail-[facility])             , [place]         Ph: [phone]       Fax: [phone]   RxID:   [id number] FUROSEMIDE 40 MG  TABS (FUROSEMIDE) take 1/2 tab by mouth once daily...  #90 x 3   Entered by:   Carron Curie CMA   Authorized by:   Michele Mcalpine MD   Signed by:   Carron Curie CMA on 03/12/2010   Method used:   Faxed to ...       [facility] (mail-order)             , [place]         Ph: [phone]       Fax: [phone]   RxID:   [id number]

## 2010-05-01 NOTE — Progress Notes (Signed)
Summary: refill request   Phone Note Call from Patient Call back at Home Phone [phone]   Caller: Patient Call For: Dr. Jarold Motto Reason for Call: Refill Medication Summary of Call: needs a refill lof Nexium before ov... [facility] in [place], [phone] Initial call taken by: Vallarie Mare,  February 24, 2010 4:58 PM  Follow-up for Phone Call        rx sent, pt aware Follow-up by: Harlow Mares CMA Duncan Dull),  February 24, 2010 5:01 PM    Prescriptions: NEXIUM 40 MG  CPDR (ESOMEPRAZOLE MAGNESIUM) 1 by mouth three times a day.Marland KitchenMUST HAVE OFFICE VISIT BEFORE ANY MORE REFILLS  #90 x 0   Entered by:   Harlow Mares CMA (AAMA)   Authorized by:   Mardella Layman MD [facility]   Signed by:   Harlow Mares CMA (AAMA) on 02/24/2010   Method used:   Electronically to        [facility] (retail)       [address]       [place], [place]  [address]       Ph: [phone]       Fax: [phone]   RxID:   [id number]

## 2010-05-01 NOTE — Assessment & Plan Note (Signed)
Summary: 6 months/apc   Primary Care Provider:  Alroy Dust MD  CC:  6 month ROV & review of mult medical problems....  History of Present Illness: 77 y/o WF here for a follow up visit... she has multiple medical problems as noted below & is followed both here and in [place]...    ~  Jan10:  she reports a good 6 months, but had a bout of diverticulitis treated by DrFagan in [place] w/ Cipro & Flagyl, resolved... she saw DrPatterson in 10/09 for a follow up- he refilled her Nexium for Tid use, and Rx w/ Align probiotic... she recently completed 3 mo of Lamisil from [facility] for nail fungus...   ~  October 02, 2008:  she's had another good 6 mo without new complaints or concerns... she is due for f/u Fasting blood work>>> BUN= 51, Creat= 1.8, & Lasix was decreased to 1/2 tab daily.   ~  December 06, 2008:  since decr the Lasix her BP monitor at home showed sl incr BP to the 130-150's/ 60-70's... she is asymptomatic, BP here today is 134/76, & we checked her BP cuff= somewhat erratic but similar readings to our cuff... we decided to keep meds the same, continue to monitor BP at home and f/u BMet.   ~  May 06, 2009:  BP has been stable- 130s/ 70s here and at home... she notes some DOE, but has been too sedentary w/o exercise program & she promises to correct this...  c/o some gas & we discussed upper gas Rx w/ GasX/ Mylicon/ BeanO, and lower gas Rx w/ Phazyme Qid... also c/o nasal obstruction- using saline & we will refer to ENT.   ~  November 04, 2009:  she's had a good 3mo- no new complaints or concerns today... breathing has been good;  she continues to get Protimes in [place];  BP controlled on meds- no CP etc;  Chol looks good on Simva + Fenofib;  Renal & bladder stable... f/u labs today & we will give TDAP.    Current Problem List:   she had PNEUMOVAX 2004, & gets yearly Flu shots... TDAP given 8/11.  ASTHMA (ICD-493.90) - on ADVAIR 100Bid & VentolinHFA Prn... prev required O2 and  Medrol but not in some time now- doing well- stable without cough, sputum, SOB, wheezing, CP, etc... prev on allergy vaccine & she stopped it... she has noted incr dyspnea w/ lack of exercise & improved back on exerc program.  ~  NOTE:  large HH seen on CXR, & takes Nexium Tid...  ~  CXR 2/11 showed large HH, DJD spine, clear lungs, NAD...  PULMONARY EMBOLISM, HX OF (ICD-V12.51) & FACTOR V DEFICIENCY (ICD-286.3) - DVT in right leg and PTE on CTAngio in 4/05 and treated w/ Hep/ COUMADIN (protimes followed in [place] by DrFagan)... last hosp in 9/06 for R heart cath to r/o pulmHTN & normal pressures were found...  HYPERTENSION (ICD-401.9) - on DILTIAZEM 240mg /d, DIOVAN 160mg /d, LASIX 40mg - 1/2 tab daily, & K20/d... BP= 136/80 and feeling well... denies HA, fatigue, visual changes, CP, palipit, dizziness, syncope, edema, etc...  ~  2DEcho 6/06 showed normal LV wall thicknees & LVF, ? mild pulm HTN w/ RVsys est 30-40  DEEP VENOUS THROMBOPHLEBITIS, HX OF (ICD-V12.52) - as above, & she wears TED hose, no edema...  HYPERCHOLESTEROLEMIA (ICD-272.0) - on SIMVASTATIN 40mg /d & FENOFIBRATE 160mg /d now...  ~  FLP 6/08 on Lescol & Tricor showed TChol 165, TG 94, HDL 49, LDL 97... rec-  contin same.  ~  FLP 7/09 showed TChol 152, TG 123, HDL 45, LDL 83... rec- change to Simva40 + Fenofib160 for $$.  ~  FLP 7/10 showed TChol 160, TG 107, HDL 51, LDL 88... continue same.  ~  FLP 8/11 showed TChol 154, TG 102, HDL 55, LDL 79... stable, same Rx.  GASTROESOPHAGEAL REFLUX DISEASE, SEVERE (ICD-530.81) - she has a large HH seen on routine CXRs... severe GERD followed by DrPatterson... on NEXIUM 40mg Tid... c/o gas & encouraged to take GasX, Mylicon, BeanO.  ~  last EGD 6/06 showed large HH & stricture dilated...  ~  symptoms remain under good control w/ Nexium Tid...  DIVERTICULOSIS, COLON (ICD-562.10) - GI f/u by DrPatterson 10/09- note reviewed... on CITRUCEL daily, & rec to take Phazyme for Gas.  ~  colonoscopy  10/00 showed divertics, no polyps.  RENAL INSUFFICIENCY (ICD-588.9)  ~  labs 6/08 showed BUN= 43, Creat= 1.7  ~  labs 7/09 showed BUN= 35, Creat= 1.7  ~  labs 7/10 showed BUN= 51, Creat= 1.8  ~  labs 9/10 showed BUN= 33, Creat= 1.4  ~  labs 2/11 showed BUN= 32, Creat= 2.1, K= 4.6  ~  labs 8/11 showed BUN= 34, Creat= 1.5, K= 4.7  NEUROGENIC BLADDER (ICD-596.54) - eval and Rx by DrKimbrough in the past... prev had to self-cath regularly & now voids satis... still on MANDELAMINE for UTI prophylaxis per Urology.  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 also takes ALLOPURINOL 300mg /d for hyperuricemia...  ~  labs 7/10 showed Uric= 4.3.Marland Kitchen. rec> continue Allopurinol.  ~  labs 8/11 showed Uric= 3.8  OSTEOPENIA (ICD-733.90) - BMD from GYN= DrNeal w/ osteopenia Rx'd w/ ca++, MVI, VitD...  ~  7/10: pt reports that DrNeal did VitD level & started her on Vit D 2000 u daily...  ~  labs 2/11 showed Vit D level = 54...  ANXIETY (ICD-300.00) - on ZOLOFT 100mg - 1/2 tab Qhs...  ANEMIA (ICD-285.9) - hx of intol to all  P O forms of iron therapy...  ~  labs 6/08 showed Hg= 11.1.Marland Kitchen.  ~  labs 7/09 showed Hg= 10.3.Marland KitchenMarland Kitchen DrPatterson checked labs 10/09 w/ Fe= 77, Ferritin= 26, B12= 382.  ~  labs 7/10 showed Hg= 11.0  ~  labs 2/11 showed Hg= 11.6  ~  labs 8/11 showed Hg= 11.9   Preventive Screening-Counseling & Management  Alcohol-Tobacco     Smoking Status: never  Allergies: 1)  Macrobid 2)  Reglan (Metoclopramide Hcl)  Comments:  Nurse/Medical Assistant: The patient's medications and allergies were reviewed with the patient and were updated in the Medication and Allergy Lists.  Past History:  Past Medical History: ASTHMA (ICD-493.90) PULMONARY EMBOLISM, HX OF (ICD-V12.51) HYPERTENSION (ICD-401.9) DEEP VENOUS THROMBOPHLEBITIS, HX OF  (ICD-V12.52) HYPERCHOLESTEROLEMIA (ICD-272.0) GASTROESOPHAGEAL REFLUX DISEASE, SEVERE (ICD-530.81) DIVERTICULOSIS, COLON (ICD-562.10) IBS (ICD-564.1) RENAL INSUFFICIENCY (ICD-588.9) NEUROGENIC BLADDER (ICD-596.54) DEGENERATIVE JOINT DISEASE (ICD-715.90) DISC DISEASE, CERVICAL (ICD-722.4) OSTEOPENIA (ICD-733.90) ANXIETY (ICD-300.00) ANEMIA (ICD-285.9) FACTOR V DEFICIENCY (ICD-286.3)  Past Surgical History: Cholecystectomy  Family History: Reviewed history from 10/02/2008 and no changes required. mother died age 3 from parkinson's disease 2 siblings: 1 brother alive age 58  hx of copd, asthma, lung cancer 1 brother alive age 59  hx of asthma and heart attack  Social History: Reviewed history from 09/28/2007 and no changes required. retired widowed 2 children nonsmoker  no alcohol  Review of Systems  See HPI       The patient complains of dyspnea on exertion.  The patient denies anorexia, fever, weight loss, weight gain, vision loss, decreased hearing, hoarseness, chest pain, syncope, peripheral edema, prolonged cough, headaches, hemoptysis, abdominal pain, melena, hematochezia, severe indigestion/heartburn, hematuria, incontinence, muscle weakness, suspicious skin lesions, transient blindness, difficulty walking, depression, unusual weight change, abnormal bleeding, enlarged lymph nodes, and angioedema.    Vital Signs:  Patient profile:   77 year old female Height:      60 inches Weight:      139.38 pounds BMI:     27.32 O2 Sat:      99 % on room air Temp:     98.0 degrees F oral Pulse rate:   84 / minute BP sitting:   136 / 80  (left arm) Cuff size:   regular  Vitals Entered By: Randell Loop CMA (November 04, 2009 10:05 AM)  O2 Sat at Rest %:  99 O2 Flow:  room air CC: 6 month ROV & review of mult medical problems... Is Patient Diabetic? No Pain Assessment Patient in pain? no      Comments no changes in meds today   Physical Exam  Additional Exam:   WD, WN, 77 y/o WF in NAD GENERAL:  Alert & oriented; pleasant & cooperative... HEENT:  [place]/AT, EOM-wnl, PERRLA, EACs-clear, TMs-wnl, NOSE-clear, THROAT-clear & wnl. NECK:  Supple w/ decrROM; no JVD; normal carotid impulses w/o bruits; no thyromegaly or nodules palpated; no lymphadenopathy. CHEST:  Clear to P & A; without wheezes/ rales/ or rhonchi. HEART:  regular rhythm; without murmurs/ rubs/ or gallops. ABDOMEN:  soft & nontender; normal bowel sounds; no organomegaly or masses detected. EXT: without deformities, mild arthritic changes; no varicose veins/ venous insuffic/ or edema. NEURO:  CN's intact;  no focal neuro deficits... DERM:  No lesions noted; no rash etc...    MISC. Report  Procedure date:  11/04/2009  Findings:      BMP (METABOL)   Sodium                    143 mEq/L                   135-145   Potassium                 4.7 mEq/L                   3.5-5.1   Chloride                  106 mEq/L                   96-112   Carbon Dioxide            28 mEq/L                    19-32   Glucose              [H]  103 mg/dL                   54-09   BUN                  [H]  34 mg/dL                    8-11   Creatinine           [H]  1.5  mg/dL                   1.6-1.0   Calcium                   9.6 mg/dL                   9.6-04.5   GFR                       36.21 mL/min                >60  CBC Platelet w/Diff (CBCD)   White Cell Count          5.4 K/uL                    4.5-10.5   Red Cell Count       [L]  3.70 Mil/uL                 3.87-5.11   Hemoglobin           [L]  11.9 g/dL                   40.9-81.1   Hematocrit           [L]  35.4 %                      36.0-46.0   MCV                       95.7 fl                     78.0-100.0   Platelet Count            238.0 K/uL                  150.0-400.0   Neutrophil %              67.8 %                      43.0-77.0   Lymphocyte %              22.1 %                      12.0-46.0   Monocyte %                8.3 %                        3.0-12.0   Eosinophils%              1.2 %                       0.0-5.0   Basophils %               0.6 %                       0.0-3.0  Lipid Panel (LIPID)   Cholesterol               154 mg/dL                   9-147    Triglycerides  102.0 mg/dL                 2.9-562.1   HDL                       30.86 mg/dL                 >57.84   LDL Cholesterol           79 mg/dL                    6-96  B12 Serum - Total ONLY (B12)   Vitamin B12               518 pg/mL                   211-911  Uric Acid (URIC)   Uric Acid                 3.8 mg/dL                   2.9-5.2   Impression & Recommendations:  Problem # 1:  ASTHMA (ICD-493.90) Stable>  continue inhalers... Her updated medication list for this problem includes:    Advair Diskus 100-50 Mcg/dose Misc (Fluticasone-salmeterol) .Marland Kitchen... 1 inhalation two times a day...  Problem # 2:  PULMONARY EMBOLISM, HX OF (ICD-V12.51) Stable on coumadin per [facility]... Her updated medication list for this problem includes:    Warfarin Sodium 2.5 Mg Tabs (Warfarin sodium) .Marland Kitchen... Take as directed  Problem # 3:  HYPERTENSION (ICD-401.9) Controlled>  same meds. Her updated medication list for this problem includes:    Diovan 160 Mg Tabs (Valsartan) .Marland Kitchen... 1 by mouth  every morning    Dilt-xr 240 Mg Cp24 (Diltiazem hcl) .Marland Kitchen... 1 by mouth once daily    Furosemide 40 Mg Tabs (Furosemide) .Marland Kitchen... Take 1/2 tab by mouth once daily...  Orders: TLB-BMP (Basic Metabolic Panel-BMET) (80048-METABOL) TLB-CBC Platelet - w/Differential (85025-CBCD) TLB-Lipid Panel (80061-LIPID) TLB-B12, Serum-Total ONLY (84132-G40) TLB-Uric Acid, Blood (84550-URIC)  Problem # 4:  HYPERCHOLESTEROLEMIA (ICD-272.0) FLP looks good>  continue same meds + diet efforts. Her updated medication list for this problem includes:    Simvastatin 40 Mg Tabs (Simvastatin) .Marland Kitchen... Take 1 tab by mouth at bedtime...    Fenofibrate 160 Mg Tabs  (Fenofibrate) .Marland Kitchen... Take 1 tab by mouth once daily...  Problem # 5:  GASTROESOPHAGEAL REFLUX DISEASE, SEVERE (ICD-530.81) She remains well controlled on the Nexium Tid per DrPatterson... Her updated medication list for this problem includes:    Nexium 40 Mg Cpdr (Esomeprazole magnesium) .Marland Kitchen... 1 by mouth three times a day  Problem # 6:  RENAL INSUFFICIENCY (ICD-588.9) Stable renal function.. Urology per drKimbrough w/ hx of neurogenic bladder (prev had to do self caths)...  Problem # 7:  DEGENERATIVE JOINT DISEASE (ICD-715.90) ORTHO is stable>  continue meds + exercise...  Problem # 8:  OTHER PROBLEMS AS NOTED>>>  Complete Medication List: 1)  Flonase 50 Mcg/act Susp (Fluticasone propionate) .... 2 sprays each nostril once daily 2)  Advair Diskus 100-50 Mcg/dose Misc (Fluticasone-salmeterol) .Marland Kitchen.. 1 inhalation two times a day... 3)  Warfarin Sodium 2.5 Mg Tabs (Warfarin sodium) .... Take as directed 4)  Diovan 160 Mg Tabs (Valsartan) .Marland Kitchen.. 1 by mouth  every morning 5)  Dilt-xr 240 Mg Cp24 (Diltiazem hcl) .Marland Kitchen.. 1 by mouth once daily 6)  Furosemide 40 Mg Tabs (Furosemide) .... Take 1/2 tab by mouth once daily.Marland KitchenMarland Kitchen  7)  Klor-con M20 20 Meq Tbcr (Potassium chloride crys cr) .Marland Kitchen.. 1 by mouth once daily 8)  Simvastatin 40 Mg Tabs (Simvastatin) .... Take 1 tab by mouth at bedtime.Marland KitchenMarland Kitchen 9)  Fenofibrate 160 Mg Tabs (Fenofibrate) .... Take 1 tab by mouth once daily... 10)  Nexium 40 Mg Cpdr (Esomeprazole magnesium) .Marland Kitchen.. 1 by mouth three times a day 11)  Citrucel Powd (Methylcellulose (laxative)) .Marland Kitchen.. 1 tsp two times a day 12)  Mandelamine 0.5 Gm Tabs (Methenamine mandelate) .Marland Kitchen.. 1 by mouth two times a day 13)  Allopurinol 300 Mg Tabs (Allopurinol) .Marland Kitchen.. 1 by mouth once daily 14)  Caltrate 600+d Plus 600-400 Mg-unit Chew (Calcium carbonate-vit d-min) .Marland Kitchen.. 1 by mouth once daily 15)  Therapeutic Multivitamin Tabs (Multiple vitamin) .Marland Kitchen.. 1 by mouth once daily 16)  Super Biotin 5000 Mcg Tabs (Biotin) .... Take 1  tablet by mouth once a day 17)  Vitamin D3 2000 Unit Caps (Cholecalciferol) .... Take 1 tablet by mouth once a day 18)  Zoloft 100 Mg Tabs (Sertraline hcl) .... 1/2 by mouth once daily  Other Orders: Tdap => 53yrs IM (66063) Admin 1st Vaccine (01601)  Patient Instructions: 1)  Today we updated your med list- see below.... 2)  Continue your current meds the same... 3)  Today we gave you the combination Tetanus vaccine called the TDAP- it should be good for 28yrs.Marland KitchenMarland Kitchen 4)  We also did your follow up FASTING blood work... please call the "phone tree" in a few days for your lab results.Marland KitchenMarland Kitchen 5)  Stay as actve as possible... 6)  Call for any problems.Marland KitchenMarland Kitchen 7)  Please schedule a follow-up appointment in 6 months.   Immunization History:  Influenza Immunization History:    Influenza:  historical (01/16/2009)  Immunizations Administered:  Tetanus Vaccine:    Vaccine Type: Tdap    Site: left deltoid    Mfr: boostrix    Dose: 0.5 ml    Route: IM    Given by: Randell Loop CMA    Exp. Date: 06/21/2011    Lot #: [id number]    VIS given: 02/15/07 version given November 04, 2009.     Orders Added: 1)  Est. Patient Level IV [20254] 2)  TLB-BMP (Basic Metabolic Panel-BMET) [80048-METABOL] 3)  TLB-CBC Platelet - w/Differential [85025-CBCD] 4)  TLB-Lipid Panel [80061-LIPID] 5)  TLB-B12, Serum-Total ONLY [82607-B12] 6)  TLB-Uric Acid, Blood [84550-URIC] 7)  Tdap => 95yrs IM [90715] 8)  Admin 1st Vaccine [27062]

## 2010-05-01 NOTE — Letter (Signed)
Summary: [facility]  [facility]   Imported By: Lester Max 12/04/2009 09:49:25  _____________________________________________________________________  External Attachment:    Type:   Image     Comment:   External Document

## 2010-05-01 NOTE — Assessment & Plan Note (Signed)
Summary: 6 months/ mbw   Primary Care Provider:  Alroy Dust MD  CC:  6 month ROV & review of mult medical problems....  History of Present Illness: 77 y/o WF here for a follow up visit... she has multiple medical problems as noted below & is followed both here and in [place]...    ~  Jan10:  she reports a good 6 months, but had a bout of diverticulitis treated by DrFagan in [place] w/ Cipro & Flagyl, resolved... she saw DrPatterson in 10/09 for a follow up- he refilled her Nexium for Tid use, and Rx w/ Align probiotic... she recently completed 3 mo of Lamisil from [facility] for nail fungus...   ~  October 02, 2008:  she's had another good 6 mo without new complaints or concerns... she is due for f/u Fasting blood work>>> BUN= 51, Creat= 1.8, & Lasix was decreased to 1/2 tab daily.   ~  December 06, 2008:  since decr the Lasix her BP monitor at home showed sl incr BP to the 130-150's/ 60-70's... she is asymptomatic, BP here today is 134/76, & we checked her BP cuff= somewhat erratic but similar readings to our cuff... we decided to keep meds the same, continue to monitor BP at home and f/u BMet.   ~  May 06, 2009:  BP has been stable- 130s/ 70s here and at home... she notes some DOE, but has been too sedentary w/o exercise program & she promises to correct this...  c/o some gas & we discussed upper gas Rx w/ GasX/ Mylicon/ BeanO, and lower gas Rx w/ Phazyme Qid... also c/o nasal obstruction- using saline & we will refer to ENT.    Current Problem List:   she had PNEUMOVAX 2004, & gets yearly Flu shots...  ASTHMA (ICD-493.90) - on ADVAIR 100Bid & VentolinHFA Prn... prev required O2 and Medrol but not in some time now- doing well- stable without cough, sputum, SOB, wheezing, CP, etc... prev on allergy vaccine & she stopped it... she has noted incr dyspnea w/ lack of exercise & we discussed this!  ~  NOTE:  large HH seen on CXR, & takes Nexium Tid...  ~  CXR 2/11 showed large HH, DJD  spine, clear lungs, NAD...  PULMONARY EMBOLISM, HX OF (ICD-V12.51) & FACTOR V DEFICIENCY (ICD-286.3) - DVT in right leg and PTE on CTAngio in 4/05 and treated w/ Hep/ COUMADIN (protimes followed in [place] by DrFagan)... last hosp in 9/06 for R heart cath to r/o pulmHTN & normal pressures were found...  HYPERTENSION (ICD-401.9) - on DILTIAZEM 240mg /d, DIOVAN 160mg /d, LASIX 40mg - 1/2 tab daily, & K20/d... BP= 138/72 and feeling well... denies HA, fatigue, visual changes, CP, palipit, dizziness, syncope, edema, etc...  ~  2DEcho 6/06 showed normal LV wall thicknees & LVF, ? mild pulm HTN w/ RVsys est 30-40  DEEP VENOUS THROMBOPHLEBITIS, HX OF (ICD-V12.52) - as above, & she wears TED hose, no edema...  HYPERCHOLESTEROLEMIA (ICD-272.0) - on SIMVASTATIN 40mg /d & FENOFIBRATE 160mg /d now...  ~  FLP 6/08 on Lescol & Tricor showed TChol 165, TG 94, HDL 49, LDL 97... rec- contin same.  ~  FLP 7/09 showed TChol 152, TG 123, HDL 45, LDL 83... rec- change to Simva40 + Fenofib160 for $$.  ~  FLP 7/10 showed TChol 160, TG 107, HDL 51, LDL 88... continue same.  GASTROESOPHAGEAL REFLUX DISEASE, SEVERE (ICD-530.81) - she has a large HH seen on routine CXRs... severe GERD followed by DrPatterson... on NEXIUM 40mg Tid... c/o  gas & encouraged to take GasX, Mylicon, BeanO.  ~  last EGD 6/06 showed large HH & stricture dilated...  DIVERTICULOSIS, COLON (ICD-562.10) - GI f/u by DrPatterson 10/09- note reviewed... on CITRUCEL daily, & rec to take Phazyme for Gas.  ~  colonoscopy 10/00 showed divertics, no polyps.  RENAL INSUFFICIENCY (ICD-588.9)  ~  labs 6/08 showed BUN= 43, Creat= 1.7  ~  labs 7/09 showed BUN= 35, Creat= 1.7  ~  labs 7/10 showed BUN= 51, Creat= 1.8  ~  labs 9/10 showed BUN= 33, Creat= 1.4  ~  labs 2/11 showed BUN= 32, Creat= 2.1, K= 4.6  NEUROGENIC BLADDER (ICD-596.54) - eval and Rx by DrKimbrough in the past... prev had to self-cath regularly & now voids satis... still on MANDELAMINE for  UTI...  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 also takes ALLOPURINOL 300mg /d for hyperuricemia...  ~  labs 7/10 showed Uric= 4.3.Marland Kitchen. rec> continue Allopurinol.  OSTEOPENIA (ICD-733.90) - BMD from GYN= DrNeal w/ osteopenia Rx'd w/ ca++, MVI, VitD...  ~  7/10: pt reports that DrNeal did VitD level & started her on Vit D 2000 u daily...  ~  labs 2/11 showed Vit d level = 54...  ANXIETY (ICD-300.00) - on ZOLOFT 100mg - 1/2 tab Qhs...  ANEMIA (ICD-285.9) - hx of intol to all  P O forms of iron therapy...  ~  labs 6/08 showed Hg= 11.1.Marland Kitchen.  ~  labs 7/09 showed Hg= 10.3.Marland KitchenMarland Kitchen DrPatterson checked labs 10/09 w/ Fe= 77, Ferritin= 26, B12= 382.  ~  labs 7/10 showed Hg= 11.0  ~  labs 2/11 showed Hg= 11.6   Allergies: 1)  Macrobid 2)  Reglan (Metoclopramide Hcl)  Comments:  Nurse/Medical Assistant: The patient's medications and allergies were reviewed with the patient and were updated in the Medication and Allergy Lists.  Past History:  Past Medical History:  *** Last Hosp  in 9/06 - see disch summary ***  ASTHMA (ICD-493.90) PULMONARY EMBOLISM, HX OF (ICD-V12.51) HYPERTENSION (ICD-401.9) DEEP VENOUS THROMBOPHLEBITIS, HX OF (ICD-V12.52) HYPERCHOLESTEROLEMIA (ICD-272.0) GASTROESOPHAGEAL REFLUX DISEASE, SEVERE (ICD-530.81) DIVERTICULOSIS, COLON (ICD-562.10) IBS (ICD-564.1) RENAL INSUFFICIENCY (ICD-588.9) NEUROGENIC BLADDER (ICD-596.54) DEGENERATIVE JOINT DISEASE (ICD-715.90) DISC DISEASE, CERVICAL (ICD-722.4) OSTEOPENIA (ICD-733.90) ANXIETY (ICD-300.00) ANEMIA (ICD-285.9) FACTOR V DEFICIENCY (ICD-286.3)  Past Surgical History: Cholecystectomy  Family History: Reviewed history from 10/02/2008 and no changes required. mother died age 81 from parkinson's disease 2 siblings: 1 brother alive age 77  hx of copd, asthma, lung cancer 1 brother alive age  33  hx of asthma and heart attack  Social History: Reviewed history from 09/28/2007 and no changes required. retired widowed 2 children nonsmoker  no etoh  Review of Systems      See HPI       The patient complains of dyspnea on exertion.  The patient denies anorexia, fever, weight loss, weight gain, vision loss, decreased hearing, hoarseness, chest pain, syncope, peripheral edema, prolonged cough, headaches, hemoptysis, abdominal pain, melena, hematochezia, severe indigestion/heartburn, hematuria, incontinence, muscle weakness, suspicious skin lesions, transient blindness, difficulty walking, depression, unusual weight change, abnormal bleeding, enlarged lymph nodes, and angioedema.    Vital Signs:  Patient profile:   77 year old female Height:      60 inches Weight:      143 pounds O2 Sat:      100 % on Room air Temp:     97.7 degrees F oral Pulse rate:   83 /  minute BP sitting:   138 / 72  (left arm) Cuff size:   regular  Vitals Entered By: Randell Loop CMA (May 06, 2009 10:22 AM)  O2 Sat at Rest %:  100 O2 Flow:  Room air CC: 6 month ROV & review of mult medical problems... Is Patient Diabetic? No Pain Assessment Patient in pain? no      Comments meds updated today   Physical Exam  Additional Exam:  WD, WN, 77 y/o WF in NAD GENERAL:  Alert & oriented; pleasant & cooperative... HEENT:  [place]/AT, EOM-wnl, PERRLA, EACs-clear, TMs-wnl, NOSE-clear, THROAT-clear & wnl. NECK:  Supple w/ decrROM; no JVD; normal carotid impulses w/o bruits; no thyromegaly or nodules palpated; no lymphadenopathy. CHEST:  Clear to P & A; without wheezes/ rales/ or rhonchi. HEART:  regular rhythm; without murmurs/ rubs/ or gallops. ABDOMEN:  soft & nontender; normal bowel sounds; no organomegaly or masses detected. EXT: without deformities, mild arthritic changes; no varicose veins/ venous insuffic/ or edema. NEURO:  CN's intact;  no focal neuro deficits... DERM:  No lesions noted; no  rash etc...    CXR  Procedure date:  05/06/2009  Findings:      CHEST - 2 VIEW Comparison: 12/08/2004   Findings: Large hiatal hernia again noted, unchanged.  Heart is upper limits normal in size.  No focal airspace opacities or effusions.  Degenerative changes in the thoracic spine.   IMPRESSION: Large hiatal hernia.  No acute findings.   Read By:  Charlett Nose,  M.D.       MISC. Report  Procedure date:  05/06/2009  Findings:      BMP (METABOL)   Sodium                    140 mEq/L                   135-145   Potassium                 4.6 mEq/L                   3.5-5.1   Chloride                  105 mEq/L                   96-112   Carbon Dioxide            29 mEq/L                    19-32   Glucose                   97 mg/dL                    65-78   BUN                  [H]  32 mg/dL                    4-69   Creatinine           [H]  2.1 mg/dL                   6.2-9.5   Calcium                   10.0 mg/dL  8.4-10.5   GFR                       24.02 mL/min                >60  Hepatic/Liver Function Panel (HEPATIC)   Total Bilirubin           0.7 mg/dL                   0.4-5.4   Direct Bilirubin          0.2 mg/dL                   0.9-8.1   Alkaline Phosphatase      52 U/L                      39-117   AST                       28 U/L                      0-37   ALT                       16 U/L                      0-35   Total Protein             7.6 g/dL                    1.9-1.4   Albumin                   4.0 g/dL                    7.8-2.9  CBC Platelet w/Diff (CBCD)   White Cell Count          6.0 K/uL                    4.5-10.5   Red Cell Count       [L]  3.60 Mil/uL                 3.87-5.11   Hemoglobin           [L]  11.6 g/dL                   56.2-13.0   Hematocrit           [L]  34.5 %                      36.0-46.0   MCV                       95.6 fl                     78.0-100.0   Platelet Count            240.0 K/uL                   150.0-400.0   Neutrophil %              69.9 %  43.0-77.0   Lymphocyte %              20.8 %                      12.0-46.0   Monocyte %                7.3 %                       3.0-12.0   Eosinophils%              1.4 %                       0.0-5.0   Basophils %               0.6 %                       0.0-3.0  TSH (TSH)   FastTSH                   1.70 uIU/mL                 0.35-5.50   Impression & Recommendations:  Problem # 1:  ASTHMA (ICD-493.90) Stable-  same meds... Her updated medication list for this problem includes:    Advair Diskus 100-50 Mcg/dose Misc (Fluticasone-salmeterol) .Marland Kitchen... 1 inhalation two times a day...  Orders: T-2 View CXR (71020TC) TLB-BMP (Basic Metabolic Panel-BMET) (80048-METABOL) TLB-Hepatic/Liver Function Pnl (80076-HEPATIC) TLB-CBC Platelet - w/Differential (85025-CBCD) TLB-TSH (Thyroid Stimulating Hormone) (84443-TSH) T-Vitamin D (25-Hydroxy) ([address])  Problem # 2:  PULMONARY EMBOLISM, HX OF (ICD-V12.51) Coumadin per DrFagan in ... Her updated medication list for this problem includes:    Warfarin Sodium 2.5 Mg Tabs (Warfarin sodium) .Marland Kitchen... Take as directed  Problem # 3:  HYPERTENSION (ICD-401.9) Controlled-  same meds. Her updated medication list for this problem includes:    Diovan 160 Mg Tabs (Valsartan) .Marland Kitchen... 1 by mouth  every morning    Dilt-xr 240 Mg Cp24 (Diltiazem hcl) .Marland Kitchen... 1 by mouth once daily    Furosemide 40 Mg Tabs (Furosemide) .Marland Kitchen... Take 1/2 tab by mouth once daily...  Problem # 4:  HYPERCHOLESTEROLEMIA (ICD-272.0) She wasn't fasting today-  prev labs looked good on these meds. Her updated medication list for this problem includes:    Simvastatin 40 Mg Tabs (Simvastatin) .Marland Kitchen... Take 1 tab by mouth at bedtime...    Fenofibrate 160 Mg Tabs (Fenofibrate) .Marland Kitchen... Take 1 tab by mouth once daily...  Problem # 5:  RENAL INSUFFICIENCY (ICD-588.9) Creat sl elevated at 2.1.Marland KitchenMarland Kitchen continue  observation...  Problem # 6:  NEUROGENIC BLADDER (UJW-119.14) Aware-  followed by Urology, DrKimbrough...  Problem # 7:  DEGENERATIVE JOINT DISEASE (ICD-715.90) Stable-  continue f/u w/ DrNudelman et al... cont on Allopurinol for hyperuricemia...  Problem # 8:  ANEMIA (ICD-285.9) Hg sl improved to 11.6.Marland KitchenMarland Kitchen continue Rx....  Problem # 9:  OTHER MEDICAL PROBLEMS AS NOTED>>> Vit d level is OK...  Complete Medication List: 1)  Flonase 50 Mcg/act Susp (Fluticasone propionate) .... 2 sprays each nostril once daily 2)  Advair Diskus 100-50 Mcg/dose Misc (Fluticasone-salmeterol) .Marland Kitchen.. 1 inhalation two times a day... 3)  Warfarin Sodium 2.5 Mg Tabs (Warfarin sodium) .... Take as directed 4)  Diovan 160 Mg Tabs (Valsartan) .Marland Kitchen.. 1 by mouth  every morning 5)  Dilt-xr 240 Mg Cp24 (Diltiazem hcl) .Marland Kitchen.. 1 by mouth once daily 6)  Furosemide 40 Mg Tabs (Furosemide) .... Take 1/2 tab by mouth once daily.Marland KitchenMarland Kitchen 7)  Klor-con M20 20 Meq Tbcr (Potassium chloride crys cr) .Marland Kitchen.. 1 by mouth once daily 8)  Simvastatin 40 Mg Tabs (Simvastatin) .... Take 1 tab by mouth at bedtime.Marland KitchenMarland Kitchen 9)  Fenofibrate 160 Mg Tabs (Fenofibrate) .... Take 1 tab by mouth once daily... 10)  Nexium 40 Mg Cpdr (Esomeprazole magnesium) .Marland Kitchen.. 1 by mouth three times a day 11)  Citrucel Powd (Methylcellulose (laxative)) .Marland Kitchen.. 1 tsp two times a day 12)  Mandelamine 0.5 Gm Tabs (Methenamine mandelate) .Marland Kitchen.. 1 by mouth two times a day 13)  Allopurinol 300 Mg Tabs (Allopurinol) .Marland Kitchen.. 1 by mouth once daily 14)  Caltrate 600+d Plus 600-400 Mg-unit Chew (Calcium carbonate-vit d-min) .Marland Kitchen.. 1 by mouth once daily 15)  Therapeutic Multivitamin Tabs (Multiple vitamin) .Marland Kitchen.. 1 by mouth once daily 16)  Super Biotin 5000 Mcg Tabs (Biotin) .... Take 1 tablet by mouth once a day 17)  Vitamin D3 2000 Unit Caps (Cholecalciferol) .... Take 1 tablet by mouth once a day 18)  Zoloft 100 Mg Tabs (Sertraline hcl) .... 1/2 by mouth once daily  Patient Instructions: 1)  Today we  updated your med list- see below.... 2)  Continue your current eds the same for now... 3)  We did your follow up CXR & non-fasting blood work today... please call the "phone tree" in a few days for your lab results.Marland KitchenMarland Kitchen 4)  Recommendation to increase your exercise program... 5)  For your nose:  try ther Saline nasal mist every 1-2 H and the "breathe right nasal strips"... if no better then it's time to check in w/ ENT.Marland KitchenMarland Kitchen 6)  Call for any questions.Marland KitchenMarland Kitchen 7)  Please schedule a follow-up appointment in 6 months.

## 2010-05-01 NOTE — Progress Notes (Signed)
Summary: Lab Results   Phone Note Call from Patient Call back at [facility] Phone [phone]   Call For: Dr Jarold Motto Reason for Call: Lab or Test Results Initial call taken by: Leanor Kail [facility],  March 18, 2010 2:42 PM  Follow-up for Phone Call        Patient notified of laboratory results Follow-up by: Darcey Nora RN, CGRN,  March 18, 2010 2:45 PM

## 2010-05-01 NOTE — Miscellaneous (Signed)
Summary: DIFLUCAN RX  Clinical Lists Changes  Medications: Added new medication of DIFLUCAN 100 MG TABS (FLUCONAZOLE) take two by mouth day one then one tablet by mouth once daily for 14 days - Signed Rx of DIFLUCAN 100 MG TABS (FLUCONAZOLE) take two by mouth day one then one tablet by mouth once daily for 14 days;  #16 x 0;  Signed;  Entered by: Harlow Mares CMA (AAMA);  Authorized by: Mardella Layman MD [facility];  Method used: Electronically to [facility] [address] [place], [place], [place]  [address], Ph: [phone], Fax: [phone]    Prescriptions: DIFLUCAN 100 MG TABS (FLUCONAZOLE) take two by mouth day one then one tablet by mouth once daily for 14 days  #16 x 0   Entered by:   Harlow Mares CMA (AAMA)   Authorized by:   Mardella Layman MD [facility]   Signed by:   Harlow Mares CMA (AAMA) on 04/09/2010   Method used:   Electronically to        [facility] (retail)       [address]       [place], [place]  [address]       Ph: [phone]       Fax: [phone]   RxID:   [id number]

## 2010-05-01 NOTE — Progress Notes (Signed)
Summary: needs meds refilled  Phone Note Call from Patient Call back at Home Phone [phone]   Caller: Patient Call For: Nancy Hicks Reason for Call: Refill Medication Summary of Call: [facility] phone [phone] patient states that [facility] will fax a refill request on klor-con, diltiazem, and diovan. she needs these filled  Initial call taken by: Valinda Hoar,  June 26, 2009 12:53 PM  Follow-up for Phone Call        rx sent. Carron Curie CMA  June 26, 2009 1:01 PM     Prescriptions: KLOR-CON M20 20 MEQ  TBCR (POTASSIUM CHLORIDE CRYS CR) 1 by mouth once daily  #90 x 4   Entered by:   Carron Curie CMA   Authorized by:   Michele Mcalpine MD   Signed by:   Carron Curie CMA on 06/26/2009   Method used:   Faxed to ...       [facility] (mail-order)             ,          Ph: [phone]       Fax: [phone]   RxID:   [id number] DILT-XR 240 MG  CP24 (DILTIAZEM HCL) 1 by mouth once daily  #90 x 4   Entered by:   Carron Curie CMA   Authorized by:   Michele Mcalpine MD   Signed by:   Carron Curie CMA on 06/26/2009   Method used:   Faxed to ...       [facility] (mail-order)             ,          Ph: [phone]       Fax: [phone]   RxID:   [id number] DIOVAN 160 MG  TABS (VALSARTAN) 1 by mouth  every morning  #90 x 4   Entered by:   Carron Curie CMA   Authorized by:   Michele Mcalpine MD   Signed by:   Carron Curie CMA on 06/26/2009   Method used:   Faxed to ...       [facility]* (mail-order)             ,          Ph: [phone]       Fax: [phone]   RxID:   [id number]

## 2010-05-06 ENCOUNTER — Encounter: Payer: Self-pay | Admitting: Pulmonary Disease

## 2010-05-06 ENCOUNTER — Other Ambulatory Visit: Payer: Self-pay | Admitting: Pulmonary Disease

## 2010-05-06 ENCOUNTER — Ambulatory Visit (INDEPENDENT_AMBULATORY_CARE_PROVIDER_SITE_OTHER): Payer: Medicare Other | Admitting: Pulmonary Disease

## 2010-05-06 ENCOUNTER — Other Ambulatory Visit: Payer: Medicare Other

## 2010-05-06 DIAGNOSIS — J45909 Unspecified asthma, uncomplicated: Secondary | ICD-10-CM

## 2010-05-06 DIAGNOSIS — Z86718 Personal history of other venous thrombosis and embolism: Secondary | ICD-10-CM

## 2010-05-06 DIAGNOSIS — K589 Irritable bowel syndrome without diarrhea: Secondary | ICD-10-CM

## 2010-05-06 DIAGNOSIS — I10 Essential (primary) hypertension: Secondary | ICD-10-CM

## 2010-05-06 DIAGNOSIS — K219 Gastro-esophageal reflux disease without esophagitis: Secondary | ICD-10-CM

## 2010-05-06 DIAGNOSIS — E7800 Pure hypercholesterolemia, unspecified: Secondary | ICD-10-CM

## 2010-05-06 DIAGNOSIS — Z8672 Personal history of thrombophlebitis: Secondary | ICD-10-CM

## 2010-05-06 DIAGNOSIS — D649 Anemia, unspecified: Secondary | ICD-10-CM

## 2010-05-06 DIAGNOSIS — K5730 Diverticulosis of large intestine without perforation or abscess without bleeding: Secondary | ICD-10-CM

## 2010-05-06 DIAGNOSIS — N259 Disorder resulting from impaired renal tubular function, unspecified: Secondary | ICD-10-CM

## 2010-05-06 LAB — CBC WITH DIFFERENTIAL/PLATELET
Basophils Relative: 0.4 % (ref 0.0–3.0)
Eosinophils Relative: 1 % (ref 0.0–5.0)
HCT: 33.9 % — ABNORMAL LOW (ref 36.0–46.0)
Lymphs Abs: 1.5 10*3/uL (ref 0.7–4.0)
MCV: 94.3 fl (ref 78.0–100.0)
Monocytes Absolute: 0.5 10*3/uL (ref 0.1–1.0)
Platelets: 232 10*3/uL (ref 150.0–400.0)
WBC: 5.8 10*3/uL (ref 4.5–10.5)

## 2010-05-06 LAB — BASIC METABOLIC PANEL
BUN: 37 mg/dL — ABNORMAL HIGH (ref 6–23)
GFR: 35.33 mL/min — ABNORMAL LOW (ref >60.00)
Potassium: 4.9 mEq/L (ref 3.5–5.1)
Sodium: 143 mEq/L (ref 135–145)

## 2010-05-06 LAB — LIPID PANEL
Cholesterol: 152 mg/dL (ref 0–200)
LDL Cholesterol: 80 mg/dL (ref 0–99)
Total CHOL/HDL Ratio: 3

## 2010-05-06 LAB — TSH: TSH: 1.56 u[IU]/mL (ref 0.35–5.50)

## 2010-05-06 LAB — HEPATIC FUNCTION PANEL
AST: 26 U/L (ref 0–37)
Total Bilirubin: 0.5 mg/dL (ref 0.3–1.2)

## 2010-05-14 ENCOUNTER — Telehealth (INDEPENDENT_AMBULATORY_CARE_PROVIDER_SITE_OTHER): Payer: Self-pay | Admitting: *Deleted

## 2010-05-21 NOTE — Progress Notes (Signed)
Summary: questions about Diovan > ok to change to losartan 100mg   Phone Note Call from Patient Call back at [facility] Phone [phone]   Caller: Patient Call For: Nancy Hicks Summary of Call: patient phoned regarding her medication Diavan and would like for a nurse to call her. It is so expensive and the pharmacist has sugguested a substiture. She can be reached at [phone] Initial call taken by: Vedia Coffer,  May 14, 2010 10:16 AM  Follow-up for Phone Call        Pt states Diovan is costing her a $100 co-pay for 3 month supply and was told by [facility] rep that a cheaper alternative would be Losartan for $30 co-pay for 3 month supply. Pt states she does not want to change if SN feels it is best for her and she will pay the cost. BP readings have been "good". Please advise. Thanks. Zackery Barefoot CMA  May 14, 2010 12:18 PM   Additional Follow-up for Phone Call Additional follow up Details #1::        ok for pt to have losartan 100mg   #90   1 by mouth once daily and refill x 3.  thanks Randell Loop CMA  May 14, 2010 2:54 PM   called spoke with patient, advised of SN's recs as stated above.  pt verbalized her understanding.  pt states that her next shipmet of diovan has already been shipped from [facility], so she will start the losartan once this is completed.  i advised pt that this is fine to do.  rx sent to [facility].  will leave diovan on med list as pt will continue to take this for the time being. Boone Master CNA/MA  May 14, 2010 3:42 PM     New/Updated Medications: LOSARTAN POTASSIUM 100 MG TABS (LOSARTAN POTASSIUM) Take 1 tablet by mouth once a day - to begin after finishing diovan rx Prescriptions: LOSARTAN POTASSIUM 100 MG TABS (LOSARTAN POTASSIUM) Take 1 tablet by mouth once a day - to begin after finishing diovan rx  #90 x 3   Entered by:   Boone Master CNA/MA   Authorized by:   Michele Mcalpine MD   Signed by:   Boone Master CNA/MA on 05/14/2010   Method used:    Electronically to        [facility] (retail)             ,          Ph: [phone]       Fax: [phone]   RxID:   [id number]

## 2010-05-21 NOTE — Assessment & Plan Note (Signed)
Summary: 6 months//apc   Primary Care Provider:  Alroy Dust MD  CC:  6 month ROV & review of mult medical problems....  History of Present Illness: 77 y/o WF here for a follow up visit... she has multiple medical problems as noted below & is followed both here and in [place] by DrFagin...    ~  November 04, 2009:  she's had a good 48mo- no new complaints or concerns today... breathing has been good;  she continues to get Protimes in [place];  BP controlled on meds- no CP etc;  Chol looks good on Simva + Fenofib;  Renal & bladder stable... f/u labs today & we will give TDAP.   ~  May 06, 2010:  Kennia developed some dysphagia/ indigestion & had GI eval by DrPatterson w/ EGD 1/12 showing HH, mult polyps, erosions, & candida plaques> Rx Diflucan, Nexium daily ==> improved...  she developed an ulcer on her leg & sent to wound clinic in [place], treated w/ antibiotics (?subseq candida esoph?) & she tells me that vascular studies are pending...  she notes some intermittent epistaxis 7 rec to use Saline nasal mist freq as needed...    Asthma is under good control;  she remains on coumadin w/ protimes done in [place];  BP well controlled on rx;  DrFagin has cut her Simva to 10mg  & f/u FLP looks good;  she has mild renal insuffic, followed by DrKimbrough for urology w/ neurogenic bladder & seen 8/11- all stable;  labs done today w/ copy to DrFagin.   Current Problem List:   she had PNEUMOVAX 2004, & gets yearly Flu shots... TDAP given 8/11.  ASTHMA (ICD-493.90) - on ADVAIR 100Bid & VentolinHFA Prn... prev required O2 and Medrol but not in some time now- doing well- stable without cough, sputum, SOB, wheezing, CP, etc... prev on allergy vaccine & she stopped it... she has noted incr dyspnea w/ lack of exercise & improved back on exerc program.  ~  NOTE:  large HH seen on CXR, & takes Nexium Tid...  ~  CXR 2/11 showed large HH, DJD spine, clear lungs, NAD...  PULMONARY EMBOLISM, HX OF  (ICD-V12.51) & FACTOR V DEFICIENCY (ICD-286.3) - DVT in right leg and PTE on CTAngio in 4/05 and treated w/ Hep/ COUMADIN (protimes followed in [place] by DrFagan)... last hosp in 9/06 for R heart cath to r/o pulmHTN & normal pressures were found...  HYPERTENSION (ICD-401.9) - on DILTIAZEM 240mg /d, DIOVAN 160mg /d, LASIX 40mg - 1/2 tab daily, & K20/d... BP= 120/62 and feeling well... denies HA, fatigue, visual changes, CP, palipit, dizziness, syncope, edema, etc...  ~  2DEcho 6/06 showed normal LV wall thicknees & LVF, ? mild pulm HTN w/ RVsys est 30-40  DEEP VENOUS THROMBOPHLEBITIS, HX OF (ICD-V12.52) - as above, & she wears TED hose, no edema...  HYPERCHOLESTEROLEMIA (ICD-272.0) - on SIMVASTATIN 10mg /d now & FENOFIBRATE 160mg /d...  ~  FLP 6/08 (on Lescol & Tricor) showed TChol 165, TG 94, HDL 49, LDL 97... rec- contin same.  ~  FLP 7/09 showed TChol 152, TG 123, HDL 45, LDL 83... rec- change to Simva40 + Fenofib160 for $$.  ~  FLP 7/10 showed TChol 160, TG 107, HDL 51, LDL 88... continue same.  ~  FLP 8/11 (on Simva40+Feno160) showed TChol 154, TG 102, HDL 55, LDL 79... stable, same Rx.  ~  FLP 2/12 (on Simva10+Feno160) showed TChol 152, TG 118, HDL 48, LDL 80  GASTROESOPHAGEAL REFLUX DISEASE, SEVERE (ICD-530.81) - she has a large  HH seen on routine CXRs... severe GERD followed by DrPatterson... on NEXIUM 40mg - recent decr to 1/d... c/o gas & encouraged to take GasX, Mylicon, BeanO.  ~  last EGD 6/06 showed large HH & stricture dilated...  ~  GI eval for incr dysphagia 1/12 w/ EGD by DrPatterson showing large prolapsing HH, candida esoph, erosions & polyps> treated w/ Diflucan, Nexium decr to once daily.  DIVERTICULOSIS, COLON (ICD-562.10) - GI f/u by DrPatterson 10/09- note reviewed... on CITRUCEL daily, & rec to take Phazyme for Gas.  ~  colonoscopy 10/00 showed divertics, no polyps.  RENAL INSUFFICIENCY (ICD-588.9)  ~  labs 6/08 showed BUN= 43, Creat= 1.7  ~  labs 7/09 showed BUN= 35,  Creat= 1.7  ~  labs 7/10 showed BUN= 51, Creat= 1.8  ~  labs 9/10 showed BUN= 33, Creat= 1.4  ~  labs 2/11 showed BUN= 32, Creat= 2.1, K= 4.6  ~  labs 8/11 showed BUN= 34, Creat= 1.5, K= 4.7  ~  labs 2/12 showed BUN= 37, creat= 1.5, K= 4.9  NEUROGENIC BLADDER (ICD-596.54) - eval and Rx by DrKimbrough in the past... prev had to self-cath regularly & now voids satis... still on MANDELAMINE for UTI prophylaxis per Urology.  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 also takes ALLOPURINOL 300mg /d for hyperuricemia...  ~  labs 7/10 showed Uric= 4.3.Marland Kitchen. rec> continue Allopurinol.  ~  labs 8/11 showed Uric= 3.8  OSTEOPENIA (ICD-733.90) - BMD from GYN= DrNeal w/ osteopenia Rx'd w/ ca++, MVI, VitD...  ~  7/10: pt reports that DrNeal did VitD level & started her on Vit D 2000 u daily...  ~  labs 2/11 showed Vit D level = 54...  ANXIETY (ICD-300.00) - on ZOLOFT 100mg - 1/2 tab Qhs...  ANEMIA (ICD-285.9) - hx of intol to all  P O forms of iron therapy...  ~  labs 6/08 showed Hg= 11.1.Marland Kitchen.  ~  labs 7/09 showed Hg= 10.3.Marland KitchenMarland Kitchen DrPatterson checked labs 10/09 w/ Fe= 77, Ferritin= 26, B12= 382.  ~  labs 7/10 showed Hg= 11.0  ~  labs 2/11 showed Hg= 11.6  ~  labs 8/11 showed Hg= 11.9  ~  labs 2/12 showed Hg= 11.4   Current Medications (verified): 1)  Flonase 50 Mcg/act  Susp (Fluticasone Propionate) .... 2 Sprays Each Nostril Once Daily 2)  Advair Diskus 100-50 Mcg/dose  Misc (Fluticasone-Salmeterol) .Marland Kitchen.. 1 Inhalation Two Times A Day... 3)  Warfarin Sodium 2.5 Mg  Tabs (Warfarin Sodium) .... Take As Directed 4)  Diovan 160 Mg  Tabs (Valsartan) .Marland Kitchen.. 1 By Mouth  Every Morning 5)  Dilt-Xr 240 Mg  Cp24 (Diltiazem Hcl) .Marland Kitchen.. 1 By Mouth Once Daily 6)  Furosemide 40 Mg  Tabs (Furosemide) .... Take 1/2 Tab By Mouth Once Daily.Marland KitchenMarland Kitchen 7)  Klor-Con M20 20 Meq  Tbcr (Potassium Chloride Crys  Cr) .Marland Kitchen.. 1 By Mouth Once Daily 8)  Simvastatin 10 Mg Tabs (Simvastatin) .... Take 1 Tablet By Mouth At Bedtime 9)  Fenofibrate 160 Mg  Tabs (Fenofibrate) .... Take 1 Tab By Mouth Once Daily... 10)  Nexium 40 Mg  Cpdr (Esomeprazole Magnesium) .... Once Daily 11)  Citrucel   Powd (Methylcellulose (Laxative)) .Marland Kitchen.. 1 Tsp Once Daily 12)  Mandelamine 0.5 Gm  Tabs (Methenamine Mandelate) .Marland Kitchen.. 1 By Mouth Two Times A Day 13)  Allopurinol 300 Mg  Tabs (Allopurinol) .Marland Kitchen.. 1 By Mouth Once Daily 14)  Caltrate 600+d Plus 600-400  Mg-Unit  Chew (Calcium Carbonate-Vit D-Min) .Marland Kitchen.. 1 By Mouth Once Daily 15)  Therapeutic Multivitamin   Tabs (Multiple Vitamin) .Marland Kitchen.. 1 By Mouth Once Daily 16)  Super Biotin 5000 Mcg Tabs (Biotin) .... Take 1 Tablet By Mouth Once A Day 17)  Vitamin D3 2000 Unit Caps (Cholecalciferol) .... Take 1 Tablet By Mouth Once A Day 18)  Zoloft 100 Mg  Tabs (Sertraline Hcl) .... 1/2 By Mouth Once Daily  Allergies (verified): 1)  Macrobid 2)  Reglan (Metoclopramide Hcl)  Past History:  Past Medical History: ASTHMA (ICD-493.90) PULMONARY EMBOLISM, HX OF (ICD-V12.51) HYPERTENSION (ICD-401.9) DEEP VENOUS THROMBOPHLEBITIS, HX OF (ICD-V12.52) HYPERCHOLESTEROLEMIA (ICD-272.0) GASTROESOPHAGEAL REFLUX DISEASE, SEVERE (ICD-530.81) DIVERTICULOSIS, COLON (ICD-562.10) IBS (ICD-564.1) RENAL INSUFFICIENCY (ICD-588.9) NEUROGENIC BLADDER (ICD-596.54) DEGENERATIVE JOINT DISEASE (ICD-715.90) DISC DISEASE, CERVICAL (ICD-722.4) OSTEOPENIA (ICD-733.90) ANXIETY (ICD-300.00) ANEMIA (ICD-285.9) FACTOR V DEFICIENCY (ICD-286.3)  Past Surgical History: Cholecystectomy Salivery gland tumor removed  Family History: Reviewed history from 11/04/2009 and no changes required. mother died age 56 from parkinson's disease 2 siblings: 1 brother alive age 39  hx of copd, asthma, lung cancer 1 brother alive age 74  hx of asthma and heart attack  Social History: Reviewed history from 11/04/2009 and no changes  required. retired widowed 2 children nonsmoker  no alcohol  Review of Systems      See HPI       The patient complains of dyspnea on exertion.  The patient denies anorexia, fever, weight loss, weight gain, vision loss, decreased hearing, hoarseness, chest pain, syncope, peripheral edema, prolonged cough, headaches, hemoptysis, abdominal pain, melena, hematochezia, severe indigestion/heartburn, hematuria, incontinence, muscle weakness, suspicious skin lesions, transient blindness, difficulty walking, depression, unusual weight change, abnormal bleeding, enlarged lymph nodes, and angioedema.    Vital Signs:  Patient profile:   77 year old female Height:      60 inches Weight:      140.13 pounds BMI:     27.47 O2 Sat:      99 % on Room air Temp:     97 degrees F oral Pulse rate:   72 / minute BP sitting:   120 / 62  (left arm) Cuff size:   regular  Vitals Entered By: Carver Fila (May 06, 2010 11:06 AM)  O2 Flow:  Room air CC: 6 month ROV & review of mult medical problems... Comments meds and allergies updated Phone number updated Carver Fila  May 06, 2010 11:07 AM      Physical Exam  Additional Exam:  WD, WN, 77 y/o WF in NAD GENERAL:  Alert & oriented; pleasant & cooperative... HEENT:  [place]/AT, EOM-wnl, PERRLA, EACs-clear, TMs-wnl, NOSE-clear, THROAT-clear & wnl. NECK:  Supple w/ decrROM; no JVD; normal carotid impulses w/o bruits; no thyromegaly or nodules palpated; no lymphadenopathy. CHEST:  Clear to P & A; without wheezes/ rales/ or rhonchi. HEART:  regular rhythm; without murmurs/ rubs/ or gallops. ABDOMEN:  soft & nontender; normal bowel sounds; no organomegaly or masses detected. EXT: without deformities, mild arthritic changes; no varicose veins/ venous insuffic/ or edema. NEURO:  CN's intact;  no focal neuro deficits... DERM:  No lesions noted; no rash etc...    Impression & Recommendations:  Problem # 1:  ASTHMA (ICD-493.90) Stable>  continue  Rx. Her updated medication list for this problem includes:    Advair Diskus 100-50 Mcg/dose Misc (Fluticasone-salmeterol) .Marland Kitchen... 1 inhalation two times a day...  Problem # 2:  PULMONARY EMBOLISM, HX OF (ICD-V12.51) She has hypercoag state w/ Factor V defic & remains  on Coumadin regularly... Her updated medication list for this problem includes:    Warfarin Sodium 2.5 Mg Tabs (Warfarin sodium) .Marland Kitchen... Take as directed  Problem # 3:  HYPERTENSION (ICD-401.9) Controlled>  ch from Diovan to Losartan per request due to $$... The following medications were removed from the medication list:    Diovan 160 Mg Tabs (Valsartan) .Marland Kitchen... 1 by mouth  every morning Her updated medication list for this problem includes:    Dilt-xr 240 Mg Cp24 (Diltiazem hcl) .Marland Kitchen... 1 by mouth once daily    Furosemide 40 Mg Tabs (Furosemide) .Marland Kitchen... Take 1/2 tab by mouth once daily...    Losartan Potassium 100 Mg Tabs (Losartan potassium) .Marland Kitchen... Take 1 tablet by mouth once a day - to begin after finishing diovan rx  Orders: TLB-BMP (Basic Metabolic Panel-BMET) (80048-METABOL) TLB-Hepatic/Liver Function Pnl (80076-HEPATIC) TLB-CBC Platelet - w/Differential (85025-CBCD) TLB-Lipid Panel (80061-LIPID) TLB-TSH (Thyroid Stimulating Hormone) (84443-TSH)  Problem # 4:  HYPERCHOLESTEROLEMIA (ICD-272.0) FLP looks good on the lower dose of Simva. Her updated medication list for this problem includes:    Simvastatin 10 Mg Tabs (Simvastatin) .Marland Kitchen... Take 1 tablet by mouth at bedtime    Fenofibrate 160 Mg Tabs (Fenofibrate) .Marland Kitchen... Take 1 tab by mouth once daily...  Problem # 5:  GASTROESOPHAGEAL REFLUX DISEASE, SEVERE (ICD-530.81) Recent eval & Rx by drPatterson reviewed... Her updated medication list for this problem includes:    Nexium 40 Mg Cpdr (Esomeprazole magnesium) ..... Once daily  Problem # 6:  RENAL INSUFFICIENCY (ICD-588.9) Renal function is stable w/ Creat= 1.5.Marland KitchenMarland Kitchen  Problem # 7:  ANEMIA (ICD-285.9) Hg is stable at 11.4  range...  Problem # 8:  OTHER MEDICAL PROBLEMS AS NOTED>>>  Complete Medication List: 1)  Flonase 50 Mcg/act Susp (Fluticasone propionate) .... 2 sprays each nostril once daily 2)  Advair Diskus 100-50 Mcg/dose Misc (Fluticasone-salmeterol) .Marland Kitchen.. 1 inhalation two times a day... 3)  Warfarin Sodium 2.5 Mg Tabs (Warfarin sodium) .... Take as directed 4)  Dilt-xr 240 Mg Cp24 (Diltiazem hcl) .Marland Kitchen.. 1 by mouth once daily 5)  Furosemide 40 Mg Tabs (Furosemide) .... Take 1/2 tab by mouth once daily.Marland KitchenMarland Kitchen 6)  Klor-con M20 20 Meq Tbcr (Potassium chloride crys cr) .Marland Kitchen.. 1 by mouth once daily 7)  Simvastatin 10 Mg Tabs (Simvastatin) .... Take 1 tablet by mouth at bedtime 8)  Fenofibrate 160 Mg Tabs (Fenofibrate) .... Take 1 tab by mouth once daily.Marland KitchenMarland Kitchen 9)  Nexium 40 Mg Cpdr (Esomeprazole magnesium) .... Once daily 10)  Citrucel Powd (Methylcellulose (laxative)) .Marland Kitchen.. 1 tsp once daily 11)  Mandelamine 0.5 Gm Tabs (Methenamine mandelate) .Marland Kitchen.. 1 by mouth two times a day 12)  Allopurinol 300 Mg Tabs (Allopurinol) .Marland Kitchen.. 1 by mouth once daily 13)  Caltrate 600+d Plus 600-400 Mg-unit Chew (Calcium carbonate-vit d-min) .Marland Kitchen.. 1 by mouth once daily 14)  Therapeutic Multivitamin Tabs (Multiple vitamin) .Marland Kitchen.. 1 by mouth once daily 15)  Super Biotin 5000 Mcg Tabs (Biotin) .... Take 1 tablet by mouth once a day 16)  Vitamin D3 2000 Unit Caps (Cholecalciferol) .... Take 1 tablet by mouth once a day 17)  Zoloft 100 Mg Tabs (Sertraline hcl) .... 1/2 by mouth once daily 18)  Losartan Potassium 100 Mg Tabs (Losartan potassium) .... Take 1 tablet by mouth once a day - to begin after finishing diovan rx  Patient Instructions: 1)  Today we updated your med list- see below.... 2)  Continue your current meds the same... 3)  Today we did your follow up fasting blood work... please call  the "phone tree" in a few days for your lab results.Marland KitchenMarland Kitchen 4)  We will send copies to DrFagin.Marland KitchenMarland Kitchen 5)  Stay as active as possible, and be careful... 6)  Call  for any problems.Marland KitchenMarland Kitchen 7)  Please schedule a follow-up appointment in 6 months.   Immunization History:  Influenza Immunization History:    Influenza:  historical (12/28/2009)

## 2010-06-09 ENCOUNTER — Telehealth: Payer: Self-pay | Admitting: Gastroenterology

## 2010-06-17 NOTE — Progress Notes (Signed)
Summary: When should she take her Nexium?   Phone Note Call from Patient Call back at Home Phone [phone]   Call For: Dr Jarold Motto Summary of Call: Had an Endo and was changed from 3 pills a day to only one. Wonders at what time she should be taking the one pill? Also wonders when was her last colon done? 02-11-1999 Initial call taken by: Leanor Kail [facility],  June 09, 2010 10:00 AM  Follow-up for Phone Call        advised to take Nexium 30 min before breakfast and her last colonoscopy was 02/11/1999. states that she is having alot of buring and she wants to know if she needs another colonoscopy she did nto have any polyps on her last colonoscopy. pt made an office visit foro 07/08/2010. she will ask questions about the colonoscopy and reflux at this office visit.  Follow-up by: Harlow Mares CMA Duncan Dull),  June 09, 2010 12:29 PM

## 2010-07-03 ENCOUNTER — Other Ambulatory Visit: Payer: Self-pay | Admitting: Pulmonary Disease

## 2010-07-08 ENCOUNTER — Ambulatory Visit: Payer: Medicare Other | Admitting: Gastroenterology

## 2010-07-14 ENCOUNTER — Other Ambulatory Visit: Payer: Self-pay | Admitting: Pulmonary Disease

## 2010-08-04 ENCOUNTER — Other Ambulatory Visit: Payer: Self-pay | Admitting: Pulmonary Disease

## 2010-08-12 ENCOUNTER — Other Ambulatory Visit: Payer: Self-pay | Admitting: Pulmonary Disease

## 2010-08-15 NOTE — Op Note (Signed)
NAMEQUETZALLI, CLOS           ACCOUNT NO.:  [id number]   MEDICAL RECORD NO.:  [id number]          PATIENT TYPE:  AMB   LOCATION:  ENDO                         FACILITY:  [facility]   PHYSICIAN:  Vania Rea. Jarold Motto, M.D. [facility] OF BIRTH:  [birth year]   DATE OF PROCEDURE:  09/17/2004  DATE OF DISCHARGE:                                 OPERATIVE REPORT   PROCEDURE:  Endoscopy.   [name] is a 77 year old white female with chronic acid reflux who has  recurrent dysphagia.  Her last endoscopy was done February 2005.  She was  recently seen in my office complaining of increasing dysphagia, so it was  felt that repeat endoscopy and esophageal dilatations were in order.  The  risks and benefits of the procedure were explained in detail, and she agreed  to proceed as planned.   ENDOSCOPY REPORT:  Throughout the procedure the patient was on pulse  oximetry and cardiac monitoring and receiving supplemental low-flow oxygen.  She was anesthetized with Cetacaine spray to her oropharynx and 50 mcg of IV  fentanyl and 5 mg of IV Versed.  She was intubated without difficulty with  the Olympus adult video endoscope.  The esophagus was generally unremarkable  except for a patch of what appeared to be heterotrophic mucosa in the  proximal esophagus.  There appeared to possibly be a stricture at the  gastroesophageal junction with a very tortuous esophagus.  There also was a  prominent hiatal hernia noted.  The stomach itself appeared free of any  mucosal or polypoid lesions.  The stomach was insufflated with air, and I  could not see any retained food products or heme in the stomach.  The  mucosal pattern of the fundus, body, antrum and pylorus of the stomach was  normal, as was the duodenal bulb and postbulbar duodenal sweep.  Retroflexed  view of the fundus and cardia of the stomach otherwise was unremarkable.  The patient was then extubated without difficulty.  We attempted to dilate  her with  [facility] dilators, but these would not pass.  She was therefore  reintubated with the video endoscope and a floppy-tipped wire was inserted  through the endoscope into the antrum of the stomach.  The endoscope was  then withdrawn over the guidewire.  The patient was then dilated with a #16  mm Savary dilator without difficulty.  She was then extubated without  difficulty and returned in stable condition to the recovery room for  observation.   ASSESSMENT:  I think this patient has chronic acid reflux and has a peptic  stricture of her esophagus that was dilated today.  Some of her dysphagia  may be due to her hiatal hernia, perhaps an associated esophageal motility  disorder.   RECOMMENDATIONS:  1.  Soft food for 24 hours.  2.  Continue daily PPI therapy.  3.  If her dysphagia continues, will proceed with esophageal manometry and      24-hour pH probe testing.        DRP/MEDQ  D:  09/17/2004  T:  09/17/2004  Job:  [id number]

## 2010-08-15 NOTE — Group Therapy Note (Signed)
NAME:  Nancy Hicks, Nancy Hicks                     ACCOUNT NO.:  [id number]   MEDICAL RECORD NO.:  [id number]                   PATIENT TYPE:  INP   LOCATION:  A225                                 FACILITY:  [facility]   PHYSICIAN:  Edward L. Juanetta Gosling, M.D.             DATE OF BIRTH:  [birth year]   DATE OF PROCEDURE:  07/13/2003  DATE OF DISCHARGE:                                   PROGRESS NOTE   PROBLEMS:  Pulmonary embolus right venous thrombosis.   SUBJECTIVE:  Nancy Hicks says she is feeling okay and wants to go home  today.  She has no new complaints.  She was able to get up and move around a  little bit.   OBJECTIVE:  Her examination shows her temperature is 97.7, pulse 82,  respirations 18, blood pressure 99/56  She looks very comfortable.  O2  saturations 96% on room air.  Her leg is less swollen.  Her Pro time is 26.8  with an INR of 3.7.  I expect that to go down since she has not had any  Coumadin now last night or today.   ASSESSMENT:  She does seem to be doing well.   PLAN:  I will continue to hold her Coumadin, have her get one extra dose of  Lovenox and I think she can go home.  Will get home health to see her to  help.  Otherwise, no new treatments or plans at this point.      ___________________________________________                                            Oneal Deputy. Juanetta Gosling, M.D.   ELH/MEDQ  D:  07/13/2003  T:  07/13/2003  Job:  [id number]

## 2010-08-15 NOTE — Group Therapy Note (Signed)
NAME:  Nancy Hicks, Nancy Hicks                     ACCOUNT NO.:  [id number]   MEDICAL RECORD NO.:  [id number]                   PATIENT TYPE:  INP   LOCATION:  A225                                 FACILITY:  [facility]   PHYSICIAN:  Edward L. Juanetta Gosling, M.D.             DATE OF BIRTH:  1926-05-20   DATE OF PROCEDURE:  DATE OF DISCHARGE:                                   PROGRESS NOTE   REASON FOR ADMISSION:  Pulmonary embolism.   SUBJECTIVE:  Nancy Hicks says she is feeling better.  She is able to get  up and move around now, which is an improvement.  She has no other new  complaints.  She says her legs are better, and overall, she feels better.   OBJECTIVE:  CHEST:  Clear.  HEART:  Regular.  EXTREMITIES:  Her leg is much less edematous, and overall, she does seem to  be doing better.   My plan now is to continue with her current medications and treatment.  She  actually shows that her prothrombin time is supratherapeutic, so I am going  to go ahead and hold her Coumadin tonight.  She could probably be discharged  in 48 hours, but it will probably be on Saturday, the 16th.      ___________________________________________                                            Oneal Deputy. Juanetta Gosling, M.D.   ELH/MEDQ  D:  07/12/2003  T:  07/12/2003  Job:  [id number]

## 2010-08-15 NOTE — Cardiovascular Report (Signed)
Nancy Hicks, HODGKINS NO.:  [id number]   MEDICAL RECORD NO.:  [id number]          PATIENT TYPE:  INP   LOCATION:  6705                         FACILITY:  [facility]   PHYSICIAN:  Nancy Hicks, M.D. [facility] OF BIRTH:  04-07-1926   DATE OF PROCEDURE:  12/15/2004  DATE OF DISCHARGE:                              CARDIAC CATHETERIZATION   PRIMARY CARE/PULMONOLOGY PHYSICIAN:  Dr. Alroy Hicks.   CARDIOLOGIST:  Dr. Gala Hicks.   IDENTIFICATION:  [name] is a delightful 77 year old woman with a  history of previous pulmonary embolus and exertional hypoxemia who recently  had echocardiogram which showed normal LV function but increased pulmonary  pressures. CT scan was also concerning for pulmonary arterial hypertension  and she was thus referred for right and left heart catheterization.   PROCEDURES PERFORMED:  1.  Selective coronary angiography.  2.  Left heart cath.  3.  Right heart cath with Fick cardiac output and oximetry run.  4.  Angio-Seal femoral artery closure.   Left ventriculogram was not done due to mild renal insufficiency.   DESCRIPTION OF PROCEDURE:  The risks and benefits of the catheterization  were explained to Nancy Hicks; consent was signed and placed on the chart.  A 6-French arterial sheath was placed in the right femoral artery using a  modified Seldinger technique. An 8-French venous sheath was placed in the  right femoral vein using a similar technique. Standard catheters were used  for left heart catheterization including JL-4, JR-4 and angled pigtail.  Initially standard Swan-Ganz catheter was used for the right heart  catheterization but we were unable to get the catheter in the pulmonary  artery and then we switched to an S-tipped Swan which advanced easily into  the pulmonary artery. All catheter exchanges were made over wire. There were  no apparent complications.   At the end of the procedure, the patient had her right  femoral arteriotomy  site sealed with Angio-Seal device. There was good hemostasis.   CONTRAST:  50 cc of Omnipaque.   HEMODYNAMIC RESULTS:  Right atrial pressure mean of 5, RV pressure 24/1. PA  pressure 20/7 with a mean of 13. Pulmonary capillary wedge pressure was 8.  Fick cardiac output was 3.8 liters per minute. Fick cardiac index was 2.4  liters per minute per meter squared. Thermodilution cardiac output was 4.9  liters per minute. Thermodilution cardiac index was 3.1 liters per minute  per meter squared. Pulmonary vascular assistance 1.3 Woods units. LV  pressure was 146/3 with EDP of 7. Central aortic pressure was 143/59 with a  mean of 94. There was no evidence of any aortic stenosis or mitral stenosis.   Pulmonary artery saturation was 68%. RV saturation was 67%. RA saturation  was 65%.   CORONARY ANATOMY:  Left main was Normal.   LAD was a moderate size vessel. The distal portion was small. It gave off  two small to moderate-sized diagonal. There was no angiographic CAD.   The left circumflex was a large vessel. It gave off a large OM1 and a small  OM2. There was no angiographic CAD.   Right coronary artery  was a large dominant vessel. It gave off moderate size  PDA and two PLs. There was small aneurysm in the proximal portion but no  significant coronary disease.   ASSESSMENT:  1.  Normal coronary arteries without significant atherosclerotic burden.      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  2.  Normal left ventricular function by echocardiogram.  3.  Normal right-sided pressures without evidence of intracardiac shunt and      a normal PVR.  4.  Exertional hypoxemia, likely secondary to intrinsic lung disease with VQ      mismatching.   PLAN:  1.  We will continue medical therapy for her and she will follow up with Dr.      Kriste Hicks. These findings have been discussed with Dr. Kriste Hicks by telephone.      She will be restarted on her Coumadin  for her hypercoagulable state and      history of pulmonary embolus.      Nancy Hicks, M.D. [facility]  Electronically Signed     DB/MEDQ  D:  12/15/2004  T:  12/15/2004  Job:  [id number]   cc:   Nancy Hicks. Nancy Hicks, M.D. [facility]  [address]  [place]  [place] [address]

## 2010-08-15 NOTE — Discharge Summary (Signed)
NAMEVERONIQUE, Nancy Hicks NO.:  [id number]   MEDICAL RECORD NO.:  [id number]          PATIENT TYPE:  INP   LOCATION:  6705                         FACILITY:  [facility]   PHYSICIAN:  Lonzo Cloud. Kriste Basque, M.D. [facility] OF BIRTH:  Feb 24, 1927   DATE OF ADMISSION:  12/10/2004  DATE OF DISCHARGE:  12/19/2004                                 DISCHARGE SUMMARY   ADDENDUM:  [name] was originally slated for discharge on December 16, 2004 but home Lovenox shots could not be arranged for her. Care  management team had contacted the insurance company regarding coverage for  home Lovenox. The copay was rather substantial and the patient could not  afford it. The insurance company refused to waive the copay for the patient.  As her protime INR was 1.1.  At the time, she needed to stay in the hospital  on systemic anticoagulants while the Coumadin was restarted. We chose to  stop her heparin in favor of Lovenox and she received 60 mg subcu b.i.d.  Daily protimes were obtained and Coumadin dose was supplemented to expedite  her coumadinization. On the morning of December 19, 2004, her protime was  21.7 seconds with an INR of 1.9 and this was deemed to be safe for  discharge.   DISCHARGE MEDICATIONS:  Please see the three page discharge summary dated  December 16, 2004. Her medications on the same except Lovenox is not  necessary and she will go home on her Coumadin 2.5 mg alternating with 1.25  mg q.o.d.   FOLLOW UP:  She was given a follow-up appointment on Wednesday, December 24, 2004 at 1:45 p.m. at which time we will check a protime in the office  and arrange for her to have further protimes at [facility] as she was doing  prior to admission.   CONDITION ON DISCHARGE:  Improved.      Lonzo Cloud. Kriste Basque, M.D. [facility]  Electronically Signed     SMN/MEDQ  D:  12/19/2004  T:  12/19/2004  Job:  [id number]

## 2010-08-15 NOTE — Discharge Summary (Signed)
Nancy Hicks, Nancy Hicks           ACCOUNT NO.:  [id number]   MEDICAL RECORD NO.:  [id number]          PATIENT TYPE:  OBV   LOCATION:  A322                          FACILITY:  [facility]   PHYSICIAN:  Kingsley Callander. Ouida Sills, MD       DATE OF BIRTH:  Nov 10, 1926   DATE OF ADMISSION:  10/07/2004  DATE OF DISCHARGE:  07/14/2006LH                                 DISCHARGE SUMMARY   DISCHARGE DIAGNOSES:  1.  Probable bacterial diarrhea.  2.  Dehydration.  3.  Normocytic anemia.  4.  Pulmonary hypertension.  5.  Asthma.  6.  Hypertension.  7.  Hyperlipidemia.  8.  Barrett's esophagus/gastroesophageal reflux disease.  9.  Neurogenic bladder.  10. Raynaud's phenomenon.   HOSPITAL COURSE:  This patient is a 77 year old female who presented with a  2-day history of diarrhea.  She had experienced vomiting x1 day.  She began  passing bloody stools. Her hemoglobin was 11.8.  INR was 3.7.  White count  was initially 18.4.  She was not febrile.  She was mildly orthostatic.  She  was dehydrated with a BUN and creatinine of 24 and 1.5.  She was started on  IV fluids.  Coumadin was held.  Stool studies were obtained.  There are no  white cells in the stool.  Her Clostridium difficile toxin was negative.  Her culture remains pending.  Her dehydration resolved with fluids.  Her BUN  creatinine normalized to 11 and 1.1.  Her hemoglobin dropped to 9.9 on July  13, but was improved to 10.80 on July 14.  Subsequent white counts were  normal.  Her INR dropped to 2.6.  She was seen in consultation by GI.  Her  diet was advanced from clear liquids to a low-residue diet.   She had undergone a colonoscopy by Dr. Jarold Motto approximately 4 years ago.   The bloody stools resolved.  She was able to tolerate her diet well.  She  was felt to be improved and stable for discharge on July 14.  She will be seen in follow-up in my office in 1 week.  Her additional  medical problems were stable during this hospitalization.   DISCHARGE MEDICATIONS:  1.  Restart Coumadin tomorrow.  2.  Advair 250 1 puff b.i.d.  3.  Ventolin p.r.n.  4.  Flonase nasal spray daily.  5.  Diovan 160 mg daily.  6.  Diltiazem ER 240 mg daily.  7.  Lasix 40 mg daily.  8.  Potassium 20 mEq daily.  9.  Nexium 40 mg t.i.d.  10. Zoloft 50 mg daily.  11. Methenamine 0.5 g b.i.d.  12. Multivitamin daily.  13. Caltrate b.i.d.  14. Lescol XL 80 mg daily.       ROF/MEDQ  D:  10/10/2004  T:  10/10/2004  Job:  [id number]

## 2010-08-15 NOTE — Discharge Summary (Signed)
Nancy Hicks, Nancy Hicks NO.:  [id number]   MEDICAL RECORD NO.:  [id number]          PATIENT TYPE:  INP   LOCATION:  6705                         FACILITY:  [facility]   PHYSICIAN:  Lonzo Cloud. Kriste Basque, M.D. [facility] OF BIRTH:  Mar 13, 1927   DATE OF ADMISSION:  12/10/2004  DATE OF DISCHARGE:  12/16/2004                                 DISCHARGE SUMMARY   FINAL DIAGNOSES:  1.  Admitted December 10, 2004 by Dr. Sung Amabile with dyspnea and question of      recurrent pulmonary emboli.  Spiral CT was negative for recurrent      pulmonary emboli.  Dyspnea is multifactorial and related to airways      disease, history of pulmonary emboli, and anxiety.  2.  Question of mild pulmonary hypertension by 2-D echocardiogram.  Previous      workup by Dr. Gala Romney and plans for right heart catheterization.      Catheterization performed during this hospitalization with normal      pulmonary artery pressures measuring 20/7 with a mean of 13.  Left heart      catheterization also performed showing no coronary disease, normal left      ventricular function and valves.  3.  History of asthmatic bronchitis and respiratory insufficiency.  4.  History of hypertension -- controlled on medications.  5.  History of hypercholesterolemia -- controlled on medications.  6.  History of hiatus hernia, gastroesophageal reflux disease and stricture.      She has a large hiatus hernia visible on her chest x-ray behind her      heart.  She has had previous Savary dilatations.  She is on Nexium 3      times a day.  6.  History of diverticulosis.  7.  History of degenerative arthritis and cervical spine disease with a      myelopathy due to spondylosis and canal stenosis.  She is followed by      Dr. Newell Coral, et al.  8.  A history of neurogenic bladder, evaluated and followed by Dr.      Aldean Ast.  9.  History of gout -- on allopurinol.  10. History of abnormal-appearing thyroid gland on recent scans.  The  patient is chemically euthyroid.  11. History of anxiety and depression -- patient on Zoloft.  12. History of anemia -- hemoglobin 10 to 11.7 this admission.  13. History of parotitis and has xerostomia with previous evaluation by Dr.      Christella Hartigan.  14. History of Raynaud's phenomenon.  15. History of heterozygous state for factor 5 Leiden mutation.   BRIEF HISTORY AND PHYSICAL:  The patient is a 77 year old white female known  to me, and followed by Dr. Carylon Perches in [place] and has seen Dr. Juanetta Gosling  in the past.  She recently had evaluation for pulmonary hypertension by Dr.  Gala Romney and was believed to have mild pulmonary hypertension by echo.  She  has a long history of airways disease and asthmatic bronchitis and allergies  and has been on Advair and Flonase.  In April 2005, she developed DVT in the  right leg  and pulmonary emboli by spiral CT.  She was admitted and treated  with heparin and switched to Coumadin at [facility] by Drs. Juanetta Gosling  and Ouida Sills.  She continues on Coumadin to this day, which is followed and  [place] and dosed by Dr. Ouida Sills.  Available records indicate that she has  been well-anticoagulated over the last several months.   The patient noted increasing shortness of breath and hypoxemia with O2 SATS  in the mid 80s with exercise.  This was felt to be multifactorial with her  asthmatic bronchitis, previous pulmonary emboli and gastroesophageal reflux  disease with laryngoesophageal reflux all playing a role.  She was placed on  a portable O2 system and had a followup GI evaluation Dr. Jarold Motto.  Endoscopy revealed chronic reflux and a stricture with a large hiatus hernia  and Nexium was increased to 3 times a day.  She noted some increased edema  and was started on Lasix therapy and a 2-D echo showed normal LV function,  but PA pressures slightly increased and she was seen by Dr. Gala Romney due to  her persistent dyspnea and these increased pressures.   He planned a right  heart cath after repeating her spiral CT that showed an irregular caliber of  her pulmonary arteries with a prune tree appearance, indicating possible  pulmonary hypertension, but no recurrent pulmonary emboli at that time.  She  did appear to have multiple areas of ground-glass parenchymal changes and  after checking the patient, we opted for a brief course of Medrol therapy to  see if this improved her dyspnea.  She had lab results that were essentially  normal, except for a positive ANA of 1:160 in a speckled pattern.  She does  have a history of some Raynaud's phenomenon.  She noted minimal change in  her symptoms, but her chest x-ray improved and a followup high-resolution CT  showed improvement in the parenchyma with no more ground-glass-appearing  areas.  This CT,  however, suggested the possibility of recurrent emboli per  Dr. Elly Modena.  For this reason and the confusing picture, she was  hospitalized for IV heparin and further evaluation.   PAST MEDICAL HISTORY:  1.  As noted, she has asthmatic bronchitis and is a nonsmoker.  She has      recently had increasing dyspnea, hypoxemia, and question of pulmonary      hypertension.  2.  History of DVT and pulmonary emboli diagnosed a year ago and has been on      heparin per Dr. Ouida Sills and well-controlled.  3.  History of hypertension -- controlled on diltiazem and Diovan and Lasix.  4.  Hypercholesterolemia -- controlled on TriCor and Lescol.  She is      intolerant to other statins including Lipitor, Crestor and Pravachol      because of muscle aches.  5.  Hiatus hernia, gastroesophageal reflux disease and stricture:  As noted,      she has a large hiatus hernia behind her heart on chest x-ray.  She had      an EGD by Dr. Jarold Motto on September 17, 2004 with Savary dilatations.  He      increased her Nexium to 3 times a day.  She has a history of     laryngoesophageal reflux and was seen by Dr. Christella Hartigan in the  past.  6.  History of diverticulosis:  Her last colonoscopy in 2000 showed      diverticulosis without any inflammation and  no polyps.  7.  History of degenerative arthritis and cervical spine disease:  She has a      history of a cervical myelopathy with spondylosis and extensive      multilevel degenerative disk disease with canal stenosis from spurs.      She has been seen by Dr. Newell Coral, who reviewed her MRIs as recently as      May of this year.  8.  She has a history of gout controlled on allopurinol.  9.  History of neurogenic bladder, followed Dr. Aldean Ast.  This is the      results for her cervical myelopathy and previously required self-      catheterizations once or twice a day.  She has not had to catheterize in      several months due to improvement her myelopathic process, but she has      methenamine as a bladder suppressant.  10. History of abnormal-appearing thyroid on recent scans.  She is      chemically and clinically euthyroid.  11.  History of anxiety and      depression, on Zoloft.  11. History of anemia which is multifactorial.  12. Previous ENT evaluation by Dr. Christella Hartigan for parotitis and xerostomia, and      the known laryngoesophageal reflux as noted.   PHYSICAL EXAMINATION:  Physical examination revealed a 78 year old lady  appearing younger than her stated age.  She was in no acute distress.  Blood  pressure 130/70, pulse 86 per minute and regular, respirations 18 per minute  and not labored, O2 SAT 99% on 2 L.  The patient was afebrile with  temperature of 98.6.  HEENT exam unremarkable.  Neck exam showed no jugular  distension, no carotid bruits.  There was no palpable thyromegaly or nodules  and no lymphadenopathy.  Respiratory exam showed the lungs to be clear  except for a few scattered rhonchi.  There were no wheezes, rales or signs  of consolidation.  Cardiac exam revealed regular rhythm, a grade 1/6  systolic ejection murmur at the left sternal  border without rubs or gallops  heard.  S1 and S2 appeared normal.  Gastrointestinal exam showed minimal  epigastric tenderness on palpation.  No evidence of organomegaly or masses.  A known large hiatus hernia is present in the heart.  The extremities show  no cyanosis, clubbing or edema.  She has multiple ecchymoses.  Neurologic  exam is intact without focal abnormalities detected.   LABORATORY DATA:  Chest x-ray had revealed no acute cardiopulmonary process.   High-resolution CT chest done on December 10, 2004 prior to admission  revealed near-complete resolution of polylobar ground-glass infiltrates;  previous question of 7-mm ovoid nodule in left lower lobe not evident;  question of recurrent pulmonary emboli per Dr. Elly Modena.  Subsequent CT  angiogram on December 11, 2004 showed no evidence of pulmonary emboli;  large paraesophageal hiatus hernia with adjacent passive atelectasis noted; mild atherosclerotic changes in the aorta.   EKG showed normal sinus rhythm with occasional PACs, otherwise normal  tracing.   Repeat 2-D echo showed left ventricular systolic function at the lower  limits of normal with ejection fraction estimated between 50% and 55%; mild  asymmetric septal hypertrophy; mild calcification of the aortic valve; left  ventricular systolic function appeared normal; PA pressure estimated at 32.   LABORATORY STUDIES:  Hemoglobin 10.0, hematocrit 30.5, white count 6100,  hemoglobin 11.7 prior to discharge.  Blood gas in the ER showed pH  7.51,  pCO2 30, indicating some hyperventilation.  Sed rate 2.  Pro time 25.8, INR  2.3.  This was on admission.  Pro times in follow-up by hospital course as  were heparin levels.  Sodium 135, potassium 3.2, a repeat 3.5, chloride 105,  CO2 28, BUN 23, creatinine 1.5, blood sugar 151, repeat 96, calcium 8.7,  total protein 5.4, albumin 3.4, AST 19, ALT 16, alk phos 37, total bilirubin  0.6, magnesium 2.3.  Rheumatoid factor  negative.  Collagen vascular screen  negative.  Repeat ANA 180 in a speckled pattern.   HOSPITAL COURSE:  The patient was admitted by Dr. Sung Amabile in my absence and  placed on heparin overnight.  A spiral CT was performed the next day which  did not confirm the radiologist's clinical suspicion of recurrent pulmonary  emboli as seen on high-resolution CT.  This being the case and because of  her confusing clinical picture, decision was made to stop her Coumadin and  placed her on heparin for right and left heart caths.  Dr. Gala Romney was  consulted and agreed with this approach.  He performed a heart  catheterization on December 15, 2004 with finding of normal coronaries,  normal LV function, and normal right-sided pressures without evidence of any  intracardiac shunt and normal pulmonary vascular resistance.  Specifically,  her PA pressures were 20/7 with a mean of 13, a wedge pressure of 8 and  pulmonary vascular resistance measured at 1.3.  Post cath, the patient was  started back on her Coumadin and decision was made to try arrange for home  Lovenox bridge while getting back on her Coumadin dosing.   MEDICATIONS AT DISCHARGE:  1.  Lovenox 60 mg subcu twice a day being arranged by Care Management Team      and Pharmacy.  She will do this for 5 days and start back on her      Coumadin 2.5 mg tablets one tablet one day alternating with a half a      tablet the next, as was her previous dose with good control by Dr.      Ouida Sills.  2.  Medrol 4 mg tablets one tablet 1 day alternating with a  half a tablet      the next.  3.  Advair 250/50 one puff b.i.d.  4.  Ventolin two puffs q.4 h. p.r.n. wheezing.  5.  Flonase two sprays in each nostril at bedtime.  6.  TriCor 145 mg p.o. daily,  7.  Lescol XL 80 one tablet p.o. daily. A  8.  Diltiazem SR 240 mg p.o. daily.  9.  Diovan 160 mg p.o. daily.  10. Lasix 40 mg p.o. q.a.m. 11. K-Dur 20 mEq one tablets p.o. daily.  12. Nexium 40 mg p.o.  t.i.d.  13. Zoloft 100 mg p.o. daily.  14. Methenamine 0.5 g p.o. b.i.d.  15. Allopurinol 200 mg p.o. daily.  16. She will also continue her previous calcium and vitamin supplements and      Citrucel daily.   DISPOSITION:  Patient being discharged to get back on her Coumadin with  Lovenox bridge.  She will follow up in the office on Monday, December 22, 2004, at 1:45 p.m. for a pro time.  After that, we will arrange for her have  her pro times done at [facility], which is closer to home, with followup by  Dr. Ouida Sills as before.   CONDITION ON DISCHARGE:  Improved.  Lonzo Cloud. Kriste Basque, M.D. [facility]  Electronically Signed     SMN/MEDQ  D:  12/16/2004  T:  12/16/2004  Job:  [id number]   cc:   Arvilla Meres, M.D. [facility]  [employer]  [address]  [place]  [place] [address]

## 2010-08-15 NOTE — Op Note (Signed)
NAME:  Nancy Hicks, [name]                     ACCOUNT NO.:  [id number]   MEDICAL RECORD NO.:  [id number]                   PATIENT TYPE:  AMB   LOCATION:  ENDO                                 FACILITY:  [facility]   PHYSICIAN:  Vania Rea. Jarold Motto, M.D. [facility]        DATE OF BIRTH:  1926/07/18   DATE OF PROCEDURE:  06/26/2003  DATE OF DISCHARGE:  06/26/2003                                 OPERATIVE REPORT   PROCEDURE:  Outpatient esophageal manometry.   Esophageal manometry was completed on June 26, 2003.  Results of manometry  are as follows:   1. Upper esophageal sphincter:  There appears to be good coordination     between pharyngeal contraction and cricopharyngeal relaxation.  2. Lower esophageal sphincter:  The lower esophageal sphincter pressure is     borderline elevated at 45 mmHg.  There is complete relaxation     approximately 70% of the time with swallows.  Normal values in our lab     are 10-45 mmHg.  3. Motility pattern:  There are normally-propagated peristaltic waves of     normal amplitude and duration throughout the length of the esophagus.     Mean amplitude of contractions is 100 mmHg.   ASSESSMENT:  This is a normal manometry except for borderline hypertensive  lower esophageal sphincter.  This manometry is not consistent with achalasia  because there is fairly good lower esophageal sphincter relaxation and  normal peristalsis.  One would have to classify this as a nonspecific  esophageal motility disorder.                                               Vania Rea. Jarold Motto, M.D. [facility]    DRP/MEDQ  D:  06/28/2003  T:  06/28/2003  Job:  [id number]

## 2010-08-15 NOTE — H&P (Signed)
Nancy Hicks, Nancy Hicks           ACCOUNT NO.:  [id number]   MEDICAL RECORD NO.:  [id number]          PATIENT TYPE:  OBV   LOCATION:  A322                          FACILITY:  [facility]   PHYSICIAN:  Kingsley Callander. Ouida Sills, MD       DATE OF BIRTH:  03/17/1927   DATE OF ADMISSION:  10/08/2004  DATE OF DISCHARGE:  LH                                HISTORY & PHYSICAL   CHIEF COMPLAINT:  Nausea, vomiting, diarrhea.   HISTORY OF PRESENT ILLNESS:  This patient is a 77 year old white female who  presented to the emergency room with a 2-day history of diarrhea.  Her last  two stools had become bloody.  She had also had vomiting for 1 day.  She had  not vomited blood.  She is anticoagulated with Coumadin due to a DVT and  pulmonary embolus.  She has had crampy abdominal pain.  She has previously  had a cholecystectomy but no other abdominal surgeries.  She has a history  of Barrett's esophagus and gastroesophageal reflux disease.   PAST MEDICAL HISTORY:  1.  Asthma.  2.  Pulmonary hypertension.  3.  Hypertension.  4.  Hyperlipidemia.  5.  Right parotid tumor.  6.  Cholecystectomy.  7.  Barrett's esophagus/GERD.  8.  Neurogenic bladder.  9.  Raynaud's phenomenon.   MEDICATIONS:  1.  Advair 250 one puff b.i.d.  2.  Ventolin p.r.n.  3.  Flonase nasal spray.  4.  Diovan 160 mg once daily.  5.  Diltiazem ER 240 mg once daily.  6.  Lasix 40 mg once daily.  7.  Potassium once daily.  8.  Nexium 40 mg t.i.d.  9.  Zoloft 50 mg once daily.  10. Methenamine 0.5 g b.i.d.  11. Multivitamin once daily.  12. Caltrate b.i.d.  13. Lescol 80 mg once daily.   ALLERGIES:  REGLAN.   SOCIAL HISTORY:  She does not smoke or drink.   FAMILY HISTORY:  Mother had Parkinson's disease.  Father had an MI.   REVIEW OF SYSTEMS:  Noncontributory.   PHYSICAL EXAMINATION:  VITAL SIGNS:  Temperature 98.4, pulse 93, blood  pressure 148/72.  GENERAL:  Alert and oriented.  HEENT:  No scleral icterus.  Pharynx is  unremarkable.  NECK:  No lymphadenopathy or thyromegaly.  LUNGS:  Clear.  HEART:  Regular with no murmurs.  ABDOMEN:  Nontender.  No hepatosplenomegaly.  No CVA tenderness.  EXTREMITIES:  No calf swelling.  No edema.  NEUROLOGIC:  Grossly intact.   LABORATORY DATA:  White count 18.4, hemoglobin 11.8, platelets 275, INR 3.7.  Sodium 135, potassium 3.5, bicarb 22, glucose 159, BUN 24, creatinine 1.5,  SGOT 29, albumin 4, calcium 8.9, lipase 38.  Urinalysis reveals ketones and  protein.   IMPRESSION:  1.  Nausea, vomiting, diarrhea, last two stools have been somewhat bloody      raising the suspicion for a possible bacterial diarrhea.  We will obtain      stool for white cells, Clostridium difficile toxin and culture, a repeat      CBC and INR are pending.  We will defer  the decision regarding      antibiotic therapy for now.  2.  Pulmonary embolus/deep vein thrombosis.  We will hold Coumadin with her      bloody diarrhea and with her INR above 3.  3.  Pulmonary hypertension.  Continue oxygen.  4.  Asthma.  Stable.  5.  Gastroesophageal reflux disease.  Continue proton pump inhibitor.  6.  Neurogenic bladder.       ROF/MEDQ  D:  10/08/2004  T:  10/08/2004  Job:  [id number]

## 2010-08-15 NOTE — Consult Note (Signed)
Nancy Hicks, Nancy Hicks           ACCOUNT NO.:  [id number]   MEDICAL RECORD NO.:  [id number]          PATIENT TYPE:  OBV   LOCATION:  A322                          FACILITY:  [facility]   PHYSICIAN:  Lionel December, M.D.    DATE OF BIRTH:  1926/05/01   DATE OF CONSULTATION:  10/09/2004  DATE OF DISCHARGE:                                   CONSULTATION   REFERRING PHYSICIAN:  Kingsley Callander. Ouida Sills, M.D.   HISTORY OF PRESENT ILLNESS:  The patient is a 77 year old, Caucasian female  with history of pulmonary embolus/DVT (clotting disorder) approximately 1  year ago who has been on Coumadin since that time and presents with a couple  day history of nausea, vomiting and diarrhea.  Prior to presenting to the  emergency department, she had a couple of bloody stools.  She describes the  blood initially as bright red and has turned more of a dark purple.  She has  not had a bowel movement, however, in the last 12 hours.  She had some  diffuse abdominal cramping with the diarrhea, but this has resolved as well.  No further vomiting noted.  Overall, she feels much better.  She had a  colonoscopy about 4 years ago with Dr. Sheryn Bison in [place].  She  denies any history of polyps.  She had upper endoscopy last month by Dr.  Jarold Motto for chronic acid reflux and dysphagia.  She was found to have a  peptic stricture at the GE junction which was dilated.  She has a prominent  hiatal hernia and there is a questionable history of Barrett's esophagus  according to medical records, but none reported on that EGD.  She continues  to have some problems with dysphagia especially to rice and other solid  foods.  Heartburn in fair control with Nexium t.i.d.  Denies any recent  antibiotic use, although she is on methenamine chronically for neurogenic  bladder.  She has well water.  She denies any ill contacts or travel abroad.   She has had a negative C. difficile and negative fecal WBC.  Stool culture  is pending.   Hemoglobin on admission was 11.8 and is 9.9 today, hematocrit  29.5, MCV 88.2.  Her INR is 3.8.   MEDICATIONS PRIOR TO ADMISSION:  1.  Allopurinol 300 mg daily.  2.  Caltrate b.i.d.  3.  Nexium 40 mg t.i.d.  4.  Coumadin 2.5 mg q.d.  5.  Advair 250 one puff b.i.d.  6.  Ventolin p.r.n.  7.  Flonase q.d.  8.  Lescol 80 mg q.d.  9.  Tri-Chlor 145 mg q.d.  10. Methenamine 0.5 g b.i.d.  11. Diltiazem ER 240 mg q.d.  12. Diovan 160 mg q.d.  13. Lasix 40 mg q.d.  14. Zoloft 50 mg q.d.  15. Potassium 20 mEq q.d.   ALLERGIES:  REGLAN causing uncontrollable shakes and movements in her  extremities.  MACROBID caused vomiting.   PAST MEDICAL HISTORY:  1.  She describes having a clotting disorder and had a pulmonary embolus and      DVT approximately 1 year ago and has  been on Coumadin since then.  She      will be on Coumadin chronically.  Further details unavailable to me at      this time.  2.  Hypercholesterolemia.  3.  Hypertension.  4.  Possible Barrett's esophagus with history of peptic stricture with      recent dilatation as outlined above.  5.  Pulmonary hypertension and is on oxygen 2 L at home.  6.  History of neurogenic bladder.  She has done self-bladder      catheterizations x20 years, but over the last 6 months has improved and      has not had to do this anymore.  She remains on methenamine.  7.  Status post cholecystectomy.  8.  Raynaud's syndrome.  9.  Right parotid tumor resection.   FAMILY HISTORY:  Negative for colorectal cancer, chronic GI illnesses or  liver disease.   SOCIAL HISTORY:  She is widowed and has two children.  She denies any  alcohol or tobacco use.   REVIEW OF SYSTEMS:  GASTROINTESTINAL:  See HPI.  GENITOURINARY:  See HPI.  CARDIOPULMONARY:  She is on oxygen chronically.  Denies any chest pain.   PHYSICAL EXAMINATION:  VITAL SIGNS:  Weight 148.2, height 60, temperature  98, pulse 80, respirations 20, blood pressure 133/71.  GENERAL:   Pleasant, well-developed, well-nourished, elderly, Caucasian  female in no acute distress.  SKIN:  Warm and dry, no jaundice.  HEENT:  Pupils equal round and reactive to light.  Conjunctivae are pale.  Sclerae are nonicteric.  Oropharyngeal mucosa moist and pink.  CHEST:  Lungs clear to auscultation.  CARDIAC:  Regular rate and rhythm.  Normal S1, S2, no murmurs, rubs or  gallops.  ABDOMEN:  Positive bowel sounds, soft, nontender, nondistended.  No  organomegaly or masses.  No rebound tenderness or guarding.  No abdominal  bruits or hernias.  EXTREMITIES:  No edema.   LABORATORY DATA AND X-RAY FINDINGS:  As mentioned in the HPI.  In addition,  white count 5600, platelets 229,000.  INR 3.8, BUN 11, creatinine 1.1,  glucose 97, potassium 3.8.  LFTs normal.  Lipase 38.   IMPRESSION:  The patient is a 77 year old lady who presents with a couple of  days history of nausea, vomiting and bloody diarrhea in the setting of  anticoagulation therapy.  Her international normalized ratio was slightly  supratherapeutic.  Thus far, Clostridium difficile and fecal white blood  cells are negative.  Stool culture is pending.  She is on chronic  methenamine for neurogenic bladder.  She appears to have colitis possibly  due to viral bacterial source.  She reports an unremarkable colonoscopy in  the last 3-4 years which is reassuring, but we will need to obtain that  report.   RECOMMENDATIONS:  1.  Will recheck her PT/INR and CBC in the morning.  2.  Followup on the stool culture.  3.  Obtain most recent colonoscopy report.  4.  Further recommendations to follow.       LL/MEDQ  D:  10/09/2004  T:  10/09/2004  Job:  [id number]

## 2010-08-15 NOTE — Discharge Summary (Signed)
NAME:  Nancy Hicks, Nancy Hicks                     ACCOUNT NO.:  [id number]   MEDICAL RECORD NO.:  [id number]                   PATIENT TYPE:  INP   LOCATION:  A225                                 FACILITY:  [facility]   PHYSICIAN:  Edward L. Juanetta Gosling, M.D.             DATE OF BIRTH:  1926-10-12   DATE OF ADMISSION:  07/09/2003  DATE OF DISCHARGE:  07/13/2003                                 DISCHARGE SUMMARY   FINAL DISCHARGE DIAGNOSES:  1. Pulmonary embolism.  2. Right leg thrombophlebitis.  3. Asthma.  4. Hypertension.  5. Hyperlipidemia.  6. History of a benign right parotid tumor.  7. Neurogenic bladder.  8. Barrett's esophagus.  9. Raynaud's phenomenon.   BRIEF HISTORY:  This is a 77 year old who went to Dr. Alonza Smoker office with  right thigh pain.  She thought it was because she had fallen about a week  prior to this.  Her leg swelled and when Dr. Ouida Sills saw her he felt that she  had thrombophlebitis of the leg and DVT and she was admitted.  She had an  episode of mild shortness of breath and sudden elevation of her heart rate  and underwent __________ .  Exam showed that she had fairly marked swelling  of her leg; her temperature was 100.3, pulse 90, respirations 18, O2  saturation was 99%; chest fairly clear; heart was irregular; abdomen was  soft; extremities showed that her right leg was diffusely larger than the  left.  Laboratory work was in essence normal.  CT scan showed evidence of  pulmonary embolism.   HOSPITAL COURSE:  She was treated with Lovenox and started on Coumadin.  She  anticoagulated rapidly on Coumadin.  She improved over the next several  days, was able to get up and move around, had physical therapy consultation  to help, and she has pending factor S and Leiden factor assays.  She is  discharged home to start Coumadin 2.5 mg on July 15, 2003.  She was  actually slightly overanticoagulated.  She is told to take it easy over the  next several days.  She is going  to have home health services.  She is going  to have a prothrombin time on July 16, 2003 and she is going to continue  Advair 250/50 one puff b.i.d., Ventolin two puffs q.4h. p.r.n. shortness of  breath, Flonase nasal spray as needed, Diovan 160 one daily, diltiazem ER  240 daily, Maxzide 25 one daily, KCl 20 mEq daily, Nexium 40 mg as before,  Zoloft 50 mg at bedtime, methenamine 0.5 g b.i.d., Caltrate Plus one tab  b.i.d., Lescol 80 mg daily, and a multiple vitamin.  She is to follow up  with Dr. Ouida Sills.     ___________________________________________  Edward L. Juanetta Gosling, M.D.   ELH/MEDQ  D:  07/13/2003  T:  07/14/2003  Job:  [id number]   cc:   Kingsley Callander. Ouida Sills, M.D.  [address]  [place]  [place] [address]  Fax: [phone]

## 2010-08-15 NOTE — H&P (Signed)
NAME:  Nancy Hicks, Nancy Hicks                     ACCOUNT NO.:  [id number]   MEDICAL RECORD NO.:  [id number]                   PATIENT TYPE:  INP   LOCATION:  A225                                 FACILITY:  [facility]   PHYSICIAN:  Kingsley Callander. Ouida Sills, M.D.                  DATE OF BIRTH:  26-Jun-1926   DATE OF ADMISSION:  07/09/2003  DATE OF DISCHARGE:                                HISTORY & PHYSICAL   CHIEF COMPLAINT:  Right leg pain.   HISTORY OF PRESENT ILLNESS:  This patient is a 77 year old white female who  presented with pain in the right thigh and right knee increasing over the  past few days.  She has recently attributed it to falling a week ago.  She  did not really injure her leg though at the time of the fall.  On evaluation  in the office, she was found to have tenderness in the right medial thigh  with swelling of the right leg.  There was no calf tenderness, however.  She  was seen to radiology for an ultrasound which was consistent with a DVT.  She was therefore hospitalized for anticoagulation.  She had experienced a  fever to 102 the night prior to admission.  She has no recent history of  prolonged periods of immobility.  She does not take estrogen.  She has a  history of a brother who has had a DVT.   PAST MEDICAL HISTORY:  1. Asthma.  2. Hypertension.  3. Hyperlipidemia.  4. History of a right parotid tumor which was benign.  5. Cholecystectomy.  6. Barrett's esophagus/ gastroesophageal reflux disease.  7. Neurogenic bladder.  8. Raynaud's.   MEDICATIONS:  1. Advair, 250, one puff b.i.d.  2. Ventolin two puffs p.r.n.  3. Flonase nasal spray daily.  4. Diovan 160 daily.  5. Diltiazem ER 240 mg daily.  6. Maxzide 25 mg daily.  7. Klor-Con 20 mEq daily.  8. Nexium 40 mg t.i.d.  9. Zoloft 50 mg q.h.s.  10.      Methenamine 0.5 grams b.i.d.  11.      Multivitamin daily.  12.      Caltrate Plus, one tablet b.i.d.  13.      Lescol XL 80 mg daily.   ALLERGIES:   Reglan.   SOCIAL HISTORY:  She does not abuse alcohol.  She does not smoke.  She has  smoked only a minimal amount earlier in life.   FAMILY HISTORY:  Her mother died at 45 and had Parkinson's disease.  Her  father died at 68 and had an MI.  She has twin brothers.  One brother has  had lung cancer, DVT's and asthma.  The other brother has had asthma.   REVIEW OF SYSTEMS:  No chest pain.  She did experience slight breathlessness  upon use of the bedside commode after she was hospitalized.  She has  experienced no syncope.  No abdominal pain.  She has chronic difficulty  voiding and self catheterizes each morning for neurogenic bladder which has  previously been evaluated at [facility] and has been attributed to lumbar  osteophyte formation.   PHYSICAL EXAMINATION:  VITAL SIGNS:  Temperature 100.3, pulse 90,  respirations 18, blood pressure 133/68, oxygen saturation 99% on room air.  GENERAL:  Alert, oriented, in no distress.  HEENT:  Eyes and oropharynx unremarkable.  NECK:  Supple with no thyromegaly or JVD.  LUNGS:  Clear.  HEART:  Initially tachycardic in the office at 116, but now 80 at the  hospital.  ABDOMEN:  Nontender, no hepatosplenomegaly.  EXTREMITIES:  There is tenderness in the right medial thigh.  The right leg  is diffusely larger than the left.  No calf tenderness is present.  Her  pedal pulses are normal.  NEUROLOGIC:  Grossly intact.   LABORATORY DATA:  White count 8.6, hemoglobin 11.6, platelets 253, INR 1.0  with pro-time of 12.9 and PTT of 36.  Sodium 128, potassium 3.5, glucose  105, BUN 14, creatinine 1.0.  LFT's are normal.  Albumin is 3.2.   EKG reveals normal sinus rhythm at 83 beats per minute.   Chest x-ray reveals slight right hilar prominence, but will be reviewed with  the radiologist.   IMPRESSION:  1. Right leg deep vein thrombosis.  She is being hospitalized for     anticoagulation with Lovenox and Coumadin.  With her initial tachycardia,      fever, and sense of breathlessness, a CT scan of the chest will be     obtained to evaluate for a pulmonary embolus.  2. Asthma, stable.  She is oxygenating well.  3. Hypertension.  Continue Diovan, diltiazem and Maxzide.  4. Barrett's esophagus/ gastroesophageal reflux disease.  Continue Nexium.  5. Neurogenic bladder.  Continued methenamine and self catheterization.     ___________________________________________                                         Kingsley Callander. Ouida Sills, M.D.   ROF/MEDQ  D:  07/10/2003  T:  07/10/2003  Job:  [id number]

## 2010-08-15 NOTE — H&P (Signed)
NAMESARAIYAH, Hicks NO.:  [id number]   MEDICAL RECORD NO.:  [id number]          PATIENT TYPE:  INP   LOCATION:  6705                         FACILITY:  [facility]   PHYSICIAN:  Oley Balm. Sung Amabile, M.D. [facility] OF BIRTH:  [birth year]   DATE OF ADMISSION:  12/10/2004  DATE OF DISCHARGE:                                HISTORY & PHYSICAL   ADMISSION DIAGNOSES:  1.  History of pulmonary embolism.  2.  History of asthma.  3.  Recent pneumonitis.  4.  Heterozygous for factor-5 Leiden mutations  5.  Pulmonary arterial hypertension.  6.  Questionable pulmonary embolism by CT scan of the chest December 10, 2004.  7.  Lightheadedness and dyspnea on the evening of admission.   HISTORY OF PRESENT ILLNESS:  Nancy Hicks is a 77 year old woman with  complex history partially outlined above. Most notably, Nancy Hicks had a pulmonary  embolism in April 2005. Nancy Hicks was found to be heterozygous for the factor-5  Leiden mutation. Nancy Hicks was instructed that Nancy Hicks would be on anticoagulation for  the rest of her life. Nancy Hicks presented to [facility] in July 2006 with  rectal bleeding due to infectious diarrhea. During that hospitalization,  anticoagulation was held. The records indicate that Nancy Hicks was not discharged  on warfarin though Nancy Hicks does not recall ever having this discontinued. Nancy Hicks  informs me that Nancy Hicks still is taking warfarin. More recently, by CT scan of  the chest pain, Nancy Hicks was diagnosed with pneumonitis. The CT scan was  performed on October 30, 2004 and Nancy Hicks was referred to Dr. Kriste Basque for further  evaluation. He has had her on a mild dose of corticosteroids since that  time. He ordered a follow-up CT scan of the chest on the day of this  admission to evaluate response to the corticosteroids. After office hours,  we were notified that the CT scan showed possible pulmonary embolism. Note,  that it was not a pulmonary embolism protocol and therefore the  opacification of the pulmonary  arteries was poor. Dr. Jodelle Green office had  contacted the patient and informed her that Nancy Hicks would probably require  hospitalization on the day following this dictation. Nancy Hicks called me through  the answering service with a complaint of lightheadedness and shortness of  breath which came on after returning home from the CT scan. Consequently,  with this history, I felt that Nancy Hicks needed to be hospitalized and  anticoagulated for probable pulmonary embolism. Nancy Hicks remained lightheaded  upon arrival to the emergency department. Nancy Hicks has never been hemodynamically  stable while here. Nancy Hicks believes her symptoms are improving spontaneously.   PAST MEDICAL HISTORY:  1.  History of pulmonary embolism as above.  2.  Pulmonary hypertension-recent echocardiogram performed at [facility]      cardiology. Absolute pulmonary pressures unknown. Plan was for a cardiac      catheterization.  3.  Asthma since early adulthood.  4.  Raynaud's phenomenon.  5.  Recent pneumonitis.  6.  Heterozygous for factor-5 Leiden.  7.  Hiatal hernia and severe gastroesophageal reflux disease.  8.  Hyperlipidemia.  9.  Hypertension.  10. Normochromic normocytic anemia.  11. Status post cholecystectomy in the late 1980s- uncertain whether Nancy Hicks      received blood transfusions.   CURRENT MEDICATIONS:  These have been reviewed and are as listed on her  discharge summary on October 10, 2004. In addition to that list, I add  methylprednisolone at 4 milligrams per day presently and warfarin (per her  report).   SOCIAL HISTORY:  No history of significant occupational exposures. Minimal  smoking history as a young adult.   FAMILY HISTORY:  Noncontributory.   REVIEW OF SYSTEMS:  As per the history present illness. Nancy Hicks also has chronic  lower extremity edema not changed from her baseline. Nancy Hicks denies fevers,  chills and sweats. No pleuritic or anginal chest pain. No cough or sputum  production. No hemoptysis. No nausea, vomiting, diarrhea  or dysuria.   PHYSICAL EXAMINATION:  VITAL SIGNS:  Afebrile, normal vital signs. Room  oxygen saturation is 99% on 2 liters by nasal cannula.  HEENT:  Reveals no acute abnormalities.  NECK:  Neck is supple without adenopathy. Jugular venous pulsations are not  well visualized.  CHEST:  Examination reveals a normal percussion note. Breath sounds are  mildly diminished with faint bilateral basilar crackles. No findings of  consolidation are noted. There is no wheezing.  CARDIAC:  Exam reveals a regular rate and rhythm without murmurs. Abdomen is  soft, nontender with normal bowel sounds.  EXTREMITIES:  Reveal 1+ bilateral symmetric ankle and pretibial edema. Also  noted are subungual telangiectasias on the nail beds of both hands and feet.  NEUROLOGIC:  Exam reveals no focal deficits.   DATA:  CT scan of the chest from December 10, 2004 demonstrates possible  filling defects in the right pulmonary artery. Nancy Hicks also has minimal  subpleural fibrotic changes. Previously seen ground glass opacities are  largely resolved. Cardiac changes are consistent with pulmonary  hypertension. Hiatal hernia is noted. The rest of her admission laboratory  data is pending at the time of this dictation.   IMPRESSION:  1.  Complex history as listed above. Certainly with her symptoms and the      suggestion of pulmonary embolism on recent CT scan, currently we need to      be concerned about acute pulmonary embolism. If this indeed is the case,      her pulmonary hypertension is almost certainly thromboembolic in origin.      However, Nancy Hicks has several features of CREST syndrome including esophageal      dysmotility (or at least gastroesophageal reflux disease),      telangiectasias and Raynaud's phenomenon. This coupled with the fibrotic      changes and the pulmonary hypertension would raise concern for CREST or      a similar collagen vascular disorder.   PLAN:  See orders for full details. In general, we  need to first rule out  pulmonary embolism with a CT angiogram. If pulmonary embolism is present, we  should treat this as thromboembolic pulmonary hypertension with  anticoagulation. If this is indeed a Coumadin failure will need to  consider hematology consultation and probably long-term anticoagulation with  low molecular weight heparin. If there is no acute pulmonary embolism seen  on the CT scan of the chest, we will need to consider evaluation for other  causes of pulmonary hypertension as discussed  above. For now, therapeutically, Nancy Hicks will be anticoagulated. Also lower  extremity Doppler studies will be obtained. Serologies for connective tissue  disorders have been ordered. Lastly, although only remotely possible, I have  requested HIV serology given the possible history of blood transfusion in  the late 1980s.           ______________________________  Oley Balm. Sung Amabile, M.D. [facility]     DBS/MEDQ  D:  12/11/2004  T:  12/11/2004  Job:  [id number]   cc:   Lonzo Cloud. Kriste Basque, M.D. [facility]  [address]  [place]  [place] [address]   Kingsley Callander. Ouida Sills, MD  [address]  [place]  [place] [address]  Fax: [phone]   Arvilla Meres, M.D. [facility]  [employer]  [address]  [place]  [place] [address]

## 2010-08-15 NOTE — Group Therapy Note (Signed)
NAME:  Nancy Hicks, Nancy Hicks                     ACCOUNT NO.:  [id number]   MEDICAL RECORD NO.:  [id number]                   PATIENT TYPE:  INP   LOCATION:  A225                                 FACILITY:  [facility]   PHYSICIAN:  Edward L. Juanetta Gosling, M.D.             DATE OF BIRTH:  Jan 04, 1927   DATE OF PROCEDURE:  DATE OF DISCHARGE:                                   PROGRESS NOTE   PROBLEM:  Thrombophlebitis with pulmonary embolism.   SUBJECTIVE:  Nancy Hicks says she is feeling okay.  She has had some  fairly brief episodes of feeling more dyspneic.  She has no other new  complaints.  She still says she is having trouble bearing weight on her leg.   OBJECTIVE:  Her exam today shows her temperature is 97.7, pulse 85,  respirations 20, blood pressure 100/51.  O2 saturations 93% on room air.  Her chest is clear.  Her leg is swollen and still somewhat edematous.   ASSESSMENT/PLAN:  She has given me some further history, and that is that  her brother has had blood clots, and has had problems with that, and  apparently has one of the clotting abnormalities.  She has also had a  previous clot, so I am going to go ahead and order a factor S in a  __________ factor assay.  Her prothrombin time today is 17.3 with an INR of  1.7.  I am going to ask also for PT to see her and get her fitted for TED  hose.  Use warm compresses on her thigh.      ___________________________________________                                            Oneal Deputy. Juanetta Gosling, M.D.   ELH/MEDQ  D:  07/11/2003  T:  07/11/2003  Job:  [id number]

## 2010-11-04 ENCOUNTER — Encounter: Payer: Self-pay | Admitting: Pulmonary Disease

## 2010-11-04 ENCOUNTER — Ambulatory Visit (INDEPENDENT_AMBULATORY_CARE_PROVIDER_SITE_OTHER): Payer: Medicare Other | Admitting: Pulmonary Disease

## 2010-11-04 DIAGNOSIS — F411 Generalized anxiety disorder: Secondary | ICD-10-CM

## 2010-11-04 DIAGNOSIS — N259 Disorder resulting from impaired renal tubular function, unspecified: Secondary | ICD-10-CM

## 2010-11-04 DIAGNOSIS — N319 Neuromuscular dysfunction of bladder, unspecified: Secondary | ICD-10-CM

## 2010-11-04 DIAGNOSIS — E7800 Pure hypercholesterolemia, unspecified: Secondary | ICD-10-CM

## 2010-11-04 DIAGNOSIS — K5730 Diverticulosis of large intestine without perforation or abscess without bleeding: Secondary | ICD-10-CM

## 2010-11-04 DIAGNOSIS — J45909 Unspecified asthma, uncomplicated: Secondary | ICD-10-CM

## 2010-11-04 DIAGNOSIS — K449 Diaphragmatic hernia without obstruction or gangrene: Secondary | ICD-10-CM

## 2010-11-04 DIAGNOSIS — I10 Essential (primary) hypertension: Secondary | ICD-10-CM

## 2010-11-04 DIAGNOSIS — K219 Gastro-esophageal reflux disease without esophagitis: Secondary | ICD-10-CM

## 2010-11-04 DIAGNOSIS — K589 Irritable bowel syndrome without diarrhea: Secondary | ICD-10-CM

## 2010-11-04 DIAGNOSIS — M949 Disorder of cartilage, unspecified: Secondary | ICD-10-CM

## 2010-11-04 DIAGNOSIS — M1990 Unspecified osteoarthritis, unspecified site: Secondary | ICD-10-CM

## 2010-11-04 DIAGNOSIS — D649 Anemia, unspecified: Secondary | ICD-10-CM

## 2010-11-04 DIAGNOSIS — Z8672 Personal history of thrombophlebitis: Secondary | ICD-10-CM

## 2010-11-04 NOTE — Patient Instructions (Signed)
Today we updated your med list in EPIC...    Continue your current meds the same for now...  Be cautious w/ your eating, & stay as active as possible...  Call for any problems...  Let's plan a follow up visit in 6 months w/ CXR & FASTING blood work at that time.Marland KitchenMarland Kitchen

## 2010-11-04 NOTE — Progress Notes (Signed)
Subjective:    Patient ID: Nancy Hicks, female    DOB: 1926/08/25, 77 y.o.   MRN: [id number]  HPI 77 y/o WF here for a follow up visit... she has multiple medical problems as noted below & is followed both here and in [place] by DrFagin...   ~  November 04, 2009:  she's had a good 80mo- no new complaints or concerns today... breathing has been good;  she continues to get Protimes in [place];  BP controlled on meds- no CP etc;  Chol looks good on Simva + Fenofib;  Renal & bladder stable... f/u labs today & we will give TDAP.  ~  May 06, 2010:  Nancy Hicks developed some dysphagia/ indigestion & had GI eval by DrPatterson w/ EGD 1/12 showing HH, mult polyps, erosions, & candida plaques> Rx Diflucan, Nexium daily ==> improved...  she developed an ulcer on her leg & sent to wound clinic in [place], treated w/ antibiotics (?subseq candida esoph?) & she tells me that vascular studies are pending...  she notes some intermittent epistaxis & rec to use Saline nasal mist freq as needed...    Asthma is under good control;  she remains on coumadin w/ protimes done in [place];  BP well controlled on rx;  DrFagin has cut her Simva to 10mg  & f/u FLP looks good;  she has mild renal insuffic, followed by DrKimbrough for urology w/ neurogenic bladder & seen 8/11- all stable;  labs done today w/ copy to DrFagin.  ~  November 04, 2010:  80mo ROV & she reports doing well in the interval- no new complaints or concerns;  Her LMD is DrFagin in [place] & she gets her Protimes monitored thru his office;  She had full labs here 2/12 & all wnl x Hg=11.4.Marland KitchenMarland Kitchen    Asthma well controlled on Advair Bid & Ventolin Prn; denies cough, phlegm, SOB (stable DOE w/o change), swelling;  Hx DVT/ PE and Factor V defic on Coumadin monitored thru DrFagin's office...    She reports BP & lipids well controlled on Cardizem, Cozaar, Lasix & KCl; along w/ Simva10 & Fenofibrate160; BP= 134/76 and feeling well> denies HA, fatigue, visual  changes, CP, palipit, dizziness, syncope, edema, etc...    She has a large HH, GERD & stable on her Nexium; she reports interval bouts of dudenitis treated by DrFagin...    See prob list below >>   Current Problem List:   she had PNEUMOVAX 2004, & gets yearly Flu shots... TDAP given 8/11.  ASTHMA (ICD-493.90) - on ADVAIR 100Bid & VentolinHFA Prn... prev required O2 and Medrol but not in some time now- doing well- stable without cough, sputum, SOB, wheezing, CP, etc... prev on allergy vaccine & she stopped it... she has noted incr dyspnea w/ lack of exercise & improved back on exerc program. ~  NOTE:  large HH seen on CXR, & takes Nexium Tid... ~  CXR 2/11 showed large HH, DJD spine, clear lungs, NAD...  PULMONARY EMBOLISM, HX OF (ICD-V12.51) & FACTOR V DEFICIENCY (ICD-286.3) - DVT in right leg and PTE on CTAngio in 4/05 and treated w/ Hep/ COUMADIN (protimes followed in [place] by DrFagan)... last hosp in 9/06 for R heart cath to r/o pulmHTN & normal pressures were found...  HYPERTENSION (ICD-401.9) - on DILTIAZEM 240mg /d, LOSARTAN 100mg /d, LASIX 40mg - 1/2 tab daily, & K20/d... ~  2DEcho 6/06 showed normal LV wall thicknees & LVF, ? mild pulm HTN w/ RVsys est 30-40  DEEP VENOUS THROMBOPHLEBITIS, HX OF (ICD-V12.52) -  as above, & she wears TED hose, no edema...  HYPERCHOLESTEROLEMIA (ICD-272.0) - on SIMVASTATIN 10mg /d now & FENOFIBRATE 160mg /d... ~  FLP 6/08 (on Lescol & Tricor) showed TChol 165, TG 94, HDL 49, LDL 97... rec- contin same. ~  FLP 7/09 showed TChol 152, TG 123, HDL 45, LDL 83... rec- change to Simva40 + Fenofib160 for $$. ~  FLP 7/10 showed TChol 160, TG 107, HDL 51, LDL 88... continue same. ~  FLP 8/11 (on Simva40+Feno160) showed TChol 154, TG 102, HDL 55, LDL 79... stable, same Rx. ~  FLP 2/12 (on Simva10+Feno160) showed TChol 152, TG 118, HDL 48, LDL 80  GASTROESOPHAGEAL REFLUX DISEASE, SEVERE (ICD-530.81) - she has a large HH seen on routine CXRs; severe GERD followed by  DrPatterson; on NEXIUM 40mg - recent decr to 1/d... c/o gas & encouraged to take GasX, Mylicon, BeanO. ~  last EGD 6/06 showed large HH & stricture dilated... ~  GI eval for incr dysphagia 1/12 w/ EGD by DrPatterson showing large prolapsing HH, candida esoph, erosions & polyps> treated w/ Diflucan, Nexium decr to once daily.  DIVERTICULOSIS, COLON (ICD-562.10) - GI f/u by DrPatterson 10/09- note reviewed... on CITRUCEL daily, & rec to take Phazyme for Gas. ~  colonoscopy 10/00 showed divertics, no polyps.  RENAL INSUFFICIENCY (ICD-588.9) ~  labs 6/08 showed BUN= 43, Creat= 1.7 ~  labs 7/09 showed BUN= 35, Creat= 1.7 ~  labs 7/10 showed BUN= 51, Creat= 1.8 ~  labs 9/10 showed BUN= 33, Creat= 1.4 ~  labs 2/11 showed BUN= 32, Creat= 2.1, K= 4.6 ~  labs 8/11 showed BUN= 34, Creat= 1.5, K= 4.7 ~  labs 2/12 showed BUN= 37, creat= 1.5, K= 4.9  NEUROGENIC BLADDER (ICD-596.54) - eval and Rx by DrKimbrough in the past... prev had to self-cath regularly & now voids satis... still on MANDELAMINE for UTI prophylaxis per Urology.  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 also takes ALLOPURINOL 300mg /d for hyperuricemia... ~  labs 7/10 showed Uric= 4.3.Marland Kitchen. rec> continue Allopurinol. ~  labs 8/11 showed Uric= 3.8  OSTEOPENIA (ICD-733.90) - BMD from GYN= DrNeal w/ osteopenia Rx'd w/ ca++, MVI, VitD... ~  7/10: pt reports that DrNeal did VitD level & started her on Vit D 2000 u daily... ~  labs 2/11 showed Vit D level = 54...  ANXIETY (ICD-300.00) - on ZOLOFT 100mg - 1/2 tab Qhs...  ANEMIA (ICD-285.9) - hx of intol to all  P O forms of iron therapy... ~  labs 6/08 showed Hg= 11.1.Marland Kitchen. ~  labs 7/09 showed Hg= 10.3.Marland KitchenMarland Kitchen DrPatterson checked labs 10/09 w/ Fe= 77, Ferritin= 26, B12= 382. ~  labs 7/10 showed Hg= 11.0 ~  labs 2/11 showed Hg= 11.6 ~  labs 8/11 showed Hg= 11.9 ~  labs  2/12 showed Hg= 11.4   Past Surgical History  Procedure Date  . Cholecystectomy   . Salivery glad tumor     Outpatient Encounter Prescriptions as of 11/04/2010  Medication Sig Dispense Refill  . ADVAIR DISKUS 100-50 MCG/DOSE AEPB INHALE 1 DOSE BY MOUTH TWICE A DAY  60 each  4  . allopurinol (ZYLOPRIM) 300 MG tablet Take 300 mg by mouth daily.        . Biotin (SUPER BIOTIN) 5 MG CAPS Take 1 capsule by mouth daily.        . Calcium Carbonate-Vit D-Min (CALTRATE 600+D PLUS) 600-400 MG-UNIT per tablet Take 1 tablet by mouth  daily.        . Cholecalciferol (VITAMIN D3) 2000 UNITS TABS Take 1 tablet by mouth daily.        Marland Kitchen diltiazem (CARDIZEM CD) 240 MG 24 hr capsule TAKE 1 CAPSULE ONCE DAILY  90 capsule  3  . esomeprazole (NEXIUM) 40 MG capsule Take 40 mg by mouth daily before breakfast.        . fenofibrate 160 MG tablet Take 160 mg by mouth daily.        . fluticasone (FLONASE) 50 MCG/ACT nasal spray SPRAY 2 SPRAYS IN BOTH   NOSTRILS  16 g  5  . furosemide (LASIX) 40 MG tablet Take 20 mg by mouth daily.        Marland Kitchen losartan (COZAAR) 100 MG tablet Take 100 mg by mouth daily.        . methenamine (MANDELAMINE) 0.5 GM tablet Take 0.5 g by mouth 2 (two) times daily.        . methylcellulose (CITRUCEL) oral powder Take 1 tsp by mouth daily       . Multiple Vitamin (MULTIVITAMIN) capsule Take 1 capsule by mouth daily.        . potassium chloride SA (K-DUR,KLOR-CON) 20 MEQ tablet TAKE 1 TABLET ONCE DAILY  90 tablet  3  . sertraline (ZOLOFT) 100 MG tablet Take 50 mg by mouth daily.        . simvastatin (ZOCOR) 10 MG tablet Take 10 mg by mouth at bedtime.        Marland Kitchen warfarin (COUMADIN) 2.5 MG tablet Take 2.5 mg by mouth as directed.          Allergies  Allergen Reactions  . Metoclopramide Hcl     REACTION: causes uncontrolable leg movements  . Nitrofurantoin     REACTION: nausea    Current Medications, Allergies, Past Medical History, Past Surgical History, Family History, and Social History  were reviewed in [employer] record.    Review of Systems    See HPI - all other systems neg except as noted...  The patient complains of dyspnea on exertion.  The patient denies anorexia, fever, weight loss, weight gain, vision loss, decreased hearing, hoarseness, chest pain, syncope, peripheral edema, prolonged cough, headaches, hemoptysis, abdominal pain, melena, hematochezia, severe indigestion/heartburn, hematuria, incontinence, muscle weakness, suspicious skin lesions, transient blindness, difficulty walking, depression, unusual weight change, abnormal bleeding, enlarged lymph nodes, and angioedema.     Objective:   Physical Exam     WD, WN, 77 y/o WF in NAD GENERAL:  Alert & oriented; pleasant & cooperative... HEENT:  [place]/AT, EOM-wnl, PERRLA, EACs-clear, TMs-wnl, NOSE-clear, THROAT-clear & wnl. NECK:  Supple w/ decrROM; no JVD; normal carotid impulses w/o bruits; no thyromegaly or nodules palpated; no lymphadenopathy. CHEST:  Clear to P & A; without wheezes/ rales/ or rhonchi. HEART:  regular rhythm; without murmurs/ rubs/ or gallops. ABDOMEN:  soft & nontender; normal bowel sounds; no organomegaly or masses detected. EXT: without deformities, mild arthritic changes; no varicose veins/ venous insuffic/ or edema. NEURO:  CN's intact;  no focal neuro deficits... DERM:  No lesions noted; no rash etc...   Assessment & Plan:   ASTHMA>  Stable on Advair, Ventolin; she remains active etc...  Hx DVT/ PTE/ Factor V defic>  Stable on Coumadin w/ protimes per DrFagin in [place]; continue same...  HBP>  Controlled on Cardizem, Losartan, Lasix; continue same...  CHOL>  Stable on simva + Fenofib and diet;  Continue same...  LargeHH/ GERD>  On  Nexium & followed by DrPatterson; continue same meds & vigorous antireflux program...  Divertics>  Stable lower GI but overdue for colonoscopy however she is now 77 y/o...  Renal Insuffic, Hx of Neurogenic Bladder>  Stable  on current meds & voiding satis w/o need to self cath, she still takes Mandelamine for UTI prevention...  DJD, Hyperuricemia, DDD, Osteopenia>  She remains on allopurinol, calcium, MVI, Vit D...  Anemia>  Hg remains stable at 11-12 range...  Anxiety/ Depression>  On ZOLOFT 100mg  taking 1/2 Qhs.Marland KitchenMarland Kitchen

## 2010-11-15 ENCOUNTER — Encounter: Payer: Self-pay | Admitting: Pulmonary Disease

## 2010-11-25 ENCOUNTER — Ambulatory Visit (INDEPENDENT_AMBULATORY_CARE_PROVIDER_SITE_OTHER): Payer: Medicare Other | Admitting: Urology

## 2010-11-25 DIAGNOSIS — N3020 Other chronic cystitis without hematuria: Secondary | ICD-10-CM

## 2010-11-25 DIAGNOSIS — Q619 Cystic kidney disease, unspecified: Secondary | ICD-10-CM

## 2010-11-25 DIAGNOSIS — N312 Flaccid neuropathic bladder, not elsewhere classified: Secondary | ICD-10-CM

## 2011-01-09 ENCOUNTER — Other Ambulatory Visit: Payer: Self-pay | Admitting: Pulmonary Disease

## 2011-01-20 ENCOUNTER — Other Ambulatory Visit: Payer: Self-pay | Admitting: Pulmonary Disease

## 2011-02-12 ENCOUNTER — Other Ambulatory Visit: Payer: Self-pay | Admitting: Pulmonary Disease

## 2011-04-05 ENCOUNTER — Other Ambulatory Visit: Payer: Self-pay | Admitting: Gastroenterology

## 2011-05-04 ENCOUNTER — Ambulatory Visit (INDEPENDENT_AMBULATORY_CARE_PROVIDER_SITE_OTHER): Payer: Medicare Other | Admitting: Pulmonary Disease

## 2011-05-04 ENCOUNTER — Ambulatory Visit (INDEPENDENT_AMBULATORY_CARE_PROVIDER_SITE_OTHER)
Admission: RE | Admit: 2011-05-04 | Discharge: 2011-05-04 | Disposition: A | Discharge status: Home / Self Care | Payer: Medicare Other | Source: Ambulatory Visit | Attending: Pulmonary Disease | Admitting: Pulmonary Disease

## 2011-05-04 ENCOUNTER — Encounter: Payer: Self-pay | Admitting: Pulmonary Disease

## 2011-05-04 ENCOUNTER — Other Ambulatory Visit (INDEPENDENT_AMBULATORY_CARE_PROVIDER_SITE_OTHER): Payer: Medicare Other

## 2011-05-04 DIAGNOSIS — J45909 Unspecified asthma, uncomplicated: Secondary | ICD-10-CM

## 2011-05-04 DIAGNOSIS — M899 Disorder of bone, unspecified: Secondary | ICD-10-CM

## 2011-05-04 DIAGNOSIS — K5730 Diverticulosis of large intestine without perforation or abscess without bleeding: Secondary | ICD-10-CM

## 2011-05-04 DIAGNOSIS — F419 Anxiety disorder, unspecified: Secondary | ICD-10-CM

## 2011-05-04 DIAGNOSIS — M1990 Unspecified osteoarthritis, unspecified site: Secondary | ICD-10-CM

## 2011-05-04 DIAGNOSIS — K449 Diaphragmatic hernia without obstruction or gangrene: Secondary | ICD-10-CM

## 2011-05-04 DIAGNOSIS — Z8672 Personal history of thrombophlebitis: Secondary | ICD-10-CM

## 2011-05-04 DIAGNOSIS — I10 Essential (primary) hypertension: Secondary | ICD-10-CM

## 2011-05-04 DIAGNOSIS — N259 Disorder resulting from impaired renal tubular function, unspecified: Secondary | ICD-10-CM

## 2011-05-04 DIAGNOSIS — F411 Generalized anxiety disorder: Secondary | ICD-10-CM

## 2011-05-04 DIAGNOSIS — E7800 Pure hypercholesterolemia, unspecified: Secondary | ICD-10-CM

## 2011-05-04 DIAGNOSIS — Z86718 Personal history of other venous thrombosis and embolism: Secondary | ICD-10-CM

## 2011-05-04 DIAGNOSIS — D649 Anemia, unspecified: Secondary | ICD-10-CM

## 2011-05-04 DIAGNOSIS — M949 Disorder of cartilage, unspecified: Secondary | ICD-10-CM

## 2011-05-04 DIAGNOSIS — K219 Gastro-esophageal reflux disease without esophagitis: Secondary | ICD-10-CM

## 2011-05-04 LAB — CBC WITH DIFFERENTIAL/PLATELET
Basophils Absolute: 0 10*3/uL (ref 0.0–0.1)
Eosinophils Absolute: 0 10*3/uL (ref 0.0–0.7)
MCHC: 33.5 g/dL (ref 30.0–36.0)
MCV: 95.8 fl (ref 78.0–100.0)
Monocytes Absolute: 0.4 10*3/uL (ref 0.1–1.0)
Neutrophils Relative %: 66.9 % (ref 43.0–77.0)
Platelets: 232 10*3/uL (ref 150.0–400.0)
WBC: 5.9 10*3/uL (ref 4.5–10.5)

## 2011-05-04 LAB — BASIC METABOLIC PANEL
BUN: 38 mg/dL — ABNORMAL HIGH (ref 6–23)
Chloride: 104 mEq/L (ref 96–112)
Potassium: 4.3 mEq/L (ref 3.5–5.1)

## 2011-05-04 LAB — LIPID PANEL
Cholesterol: 172 mg/dL (ref 0–200)
LDL Cholesterol: 95 mg/dL (ref 0–99)

## 2011-05-04 LAB — HEPATIC FUNCTION PANEL
ALT: 18 U/L (ref 0–35)
AST: 24 U/L (ref 0–37)
Bilirubin, Direct: 0.1 mg/dL (ref 0.0–0.3)
Total Bilirubin: 0.7 mg/dL (ref 0.3–1.2)

## 2011-05-04 LAB — TSH: TSH: 1.73 u[IU]/mL (ref 0.35–5.50)

## 2011-05-04 NOTE — Progress Notes (Signed)
Subjective:    Patient ID: Nancy Hicks, female    DOB: 09/06/1926, 78 y.o.   MRN: [id number]  HPI 78 y/o WF here for a follow up visit... she has multiple medical problems as noted below & is followed both here and in [place] by DrFagin...   ~  May 06, 2010:  Nancy Hicks developed some dysphagia/ indigestion & had GI eval by DrPatterson w/ EGD 1/12 showing HH, mult polyps, erosions, & candida plaques> Rx Diflucan, Nexium daily ==> improved...  she developed an ulcer on her leg & sent to wound clinic in [place], treated w/ antibiotics (?subseq candida esoph?) & she tells me that vascular studies are pending...  she notes some intermittent epistaxis & rec to use Saline nasal mist freq as needed...    Asthma is under good control;  she remains on coumadin w/ protimes done in [place];  BP well controlled on rx;  DrFagin has cut her Simva to 10mg  & f/u FLP looks good;  she has mild renal insuffic, followed by DrKimbrough for urology w/ neurogenic bladder & seen 8/11- all stable;  labs done today w/ copy to DrFagin.  ~  November 04, 2010:  62mo ROV & she reports doing well in the interval- no new complaints or concerns;  Her LMD is DrFagin in [place] & she gets her Protimes monitored thru his office;  She had full labs here 2/12 & all wnl x Hg=11.4.Marland KitchenMarland Kitchen    Asthma well controlled on Advair Bid & Ventolin Prn; denies cough, phlegm, SOB (stable DOE w/o change), swelling;  Hx DVT/ PE and Factor V defic on Coumadin monitored thru DrFagin's office...    She reports BP & lipids well controlled on Cardizem, Cozaar, Lasix & KCl; along w/ Simva10 & Fenofibrate160; BP= 134/76 and feeling well> denies HA, fatigue, visual changes, CP, palipit, dizziness, syncope, edema, etc...    She has a large HH, GERD & stable on her Nexium; she reports interval bouts of dudenitis treated by DrFagin...    See prob list below >>  ~  May 04, 2011:  62mo ROV & she reports a quiet interval, no new complaints or concerns;   She sees DrFagin in [place] for primary care & protimes (on coumadin for hx PE/ DVT & Factor V defic);  We follow her Asthma> stable on Advair100 and VentolinHFA rescue inhaler (she has NEVER needed to use this she says)...    BP controlled on diltiazem, Losartan, Lasix> 140/60 today & she denies CP, palpit, ch in DOE, edema, etc...    Chol controlled on diet + Simva & Feno> FLP today looks good (see below)...    She has a large HH seen on CXRs & hx severe GERD followed by drPatterson but she reports well controlled now on Nexium 40mg /d...    She also had stable mild Renalinsuffic w/ BUN=38, Creat=1.5 & reminded to stay well hydrated; prev hx of neurogenic bladder & used to have to do self caths but this has resolved itself 7 she reports continued normal voiding regimen...          Problem List:   she had PNEUMOVAX 2004, & gets yearly Flu shots... TDAP given 8/11.  ASTHMA (ICD-493.90) - on ADVAIR 100Bid & VentolinHFA Prn... prev required O2 and Medrol but not in some time now- doing well- stable without cough, sputum, SOB, wheezing, CP, etc... prev on allergy vaccine & she stopped it... she has noted incr dyspnea w/ lack of exercise & improved back on exerc program. ~  NOTE:  large HH seen on CXR, & takes Nexium Tid... ~  CXR 2/11 showed large HH, DJD spine, clear lungs, NAD.Marland Kitchen. ~  CXR 2/13 showed large HH, DJD/ scoliosis of spine, NAD...  PULMONARY EMBOLISM, HX OF (ICD-V12.51) & FACTOR V DEFICIENCY (ICD-286.3) - DVT in right leg and PTE on CTAngio in 4/05 and treated w/ Hep/ COUMADIN (protimes followed in [place] by DrFagan)... last hosp in 9/06 for R heart cath to r/o pulmHTN & normal pressures were found...  HYPERTENSION (ICD-401.9) - on DILTIAZEM 240mg /d, LOSARTAN 100mg /d, LASIX 40mg - 1/2 tab daily, & K20/d... ~  2DEcho 6/06 showed normal LV wall thicknees & LVF, ? mild pulm HTN w/ RVsys est 30-40 ~  8/12:  BP= 134/76 & she denies CP, palpit, dizzy, SOB, edema... ~  2/13:  BP= 140/60 &  she remains asymptomatic...  DEEP VENOUS THROMBOPHLEBITIS, HX OF (ICD-V12.52) - as above, & she wears TED hose, no edema...  HYPERCHOLESTEROLEMIA (ICD-272.0) - on SIMVASTATIN 10mg /d now & FENOFIBRATE 160mg /d... ~  FLP 6/08 (on Lescol & Tricor) showed TChol 165, TG 94, HDL 49, LDL 97... rec- contin same. ~  FLP 7/09 showed TChol 152, TG 123, HDL 45, LDL 83... rec- change to Simva40 + Fenofib160 for $$. ~  FLP 7/10 showed TChol 160, TG 107, HDL 51, LDL 88... continue same. ~  FLP 8/11 (on Simva10+Feno160) showed TChol 154, TG 102, HDL 55, LDL 79... stable, same Rx. ~  FLP 2/12 (on Simva10+Feno160) showed TChol 152, TG 118, HDL 48, LDL 80 ~  FLP 2/13 (on Simva10+Feno160) showed TChol 172, TG 123, HDL 52, LDL 95  GASTROESOPHAGEAL REFLUX DISEASE, SEVERE (ICD-530.81) - she has a large HH seen on routine CXRs; severe GERD followed by DrPatterson; on NEXIUM 40mg - recent decr to 1/d... c/o gas & encouraged to take GasX, Mylicon, BeanO. ~  last EGD 6/06 showed large HH & stricture dilated... ~  GI eval for incr dysphagia 1/12 w/ EGD by DrPatterson showing large prolapsing HH, candida esoph, erosions & polyps> treated w/ Diflucan, Nexium decr to once daily. ~  She reports stable on Nexium 40mg  /d...  DIVERTICULOSIS, COLON (ICD-562.10) - GI f/u by DrPatterson 10/09- note reviewed... on CITRUCEL daily, & rec to take Phazyme for Gas. ~  colonoscopy 10/00 showed divertics, no polyps.  RENAL INSUFFICIENCY (ICD-588.9) ~  labs 6/08 showed BUN= 43, Creat= 1.7 ~  labs 7/09 showed BUN= 35, Creat= 1.7 ~  labs 7/10 showed BUN= 51, Creat= 1.8 ~  labs 9/10 showed BUN= 33, Creat= 1.4 ~  labs 2/11 showed BUN= 32, Creat= 2.1, K= 4.6 ~  labs 8/11 showed BUN= 34, Creat= 1.5, K= 4.7 ~  labs 2/12 showed BUN= 37, creat= 1.5, K= 4.9 ~  Labs 2/13 showed BUN= 38, Creat= 1.5, K= 4.3  NEUROGENIC BLADDER (ICD-596.54) - eval and Rx by DrKimbrough in the past... prev had to self-cath regularly, & now voids satis on her own...  still on MANDELAMINE for UTI prophylaxis per Urology.  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 also takes ALLOPURINOL 300mg /d for hyperuricemia... ~  labs 7/10 showed Uric= 4.3.Marland Kitchen. rec> continue Allopurinol. ~  labs 8/11 showed Uric= 3.8  OSTEOPENIA (ICD-733.90) - BMD from GYN= DrNeal w/ osteopenia Rx'd w/ ca++, MVI, VitD... ~  7/10: pt reports that DrNeal did VitD level & started her on Vit D 2000 u daily... ~  labs 2/11 showed Vit D level =  54... ~  Labs 2/13 showed Vit D level = 48... Continue supplement.  ANXIETY (ICD-300.00) - on ZOLOFT 100mg - 1/2 tab Qhs...  ANEMIA (ICD-285.9) - hx of intol to all  P O forms of iron therapy... ~  labs 6/08 showed Hg= 11.1.Marland Kitchen. ~  labs 7/09 showed Hg= 10.3.Marland KitchenMarland Kitchen DrPatterson checked labs 10/09 w/ Fe= 77, Ferritin= 26, B12= 382. ~  labs 7/10 showed Hg= 11.0 ~  labs 2/11 showed Hg= 11.6 ~  labs 8/11 showed Hg= 11.9 ~  labs 2/12 showed Hg= 11.4 ~  Labs 2/13 showed Hg= 12.4, MCV= 96   Past Surgical History  Procedure Date  . Cholecystectomy   . Salivery glad tumor     Outpatient Encounter Prescriptions as of 05/04/2011  Medication Sig Dispense Refill  . ADVAIR DISKUS 100-50 MCG/DOSE AEPB INHALE 1 DOSE BY MOUTH TWICE A DAY  60 each  6  . allopurinol (ZYLOPRIM) 300 MG tablet Take 300 mg by mouth daily.        . Biotin (SUPER BIOTIN) 5 MG CAPS Take 1 capsule by mouth daily.        . Calcium Citrate-Vitamin D (CITRACAL MAXIMUM) 315-250 MG-UNIT TABS Take 1 capsule by mouth daily.      . Cholecalciferol (VITAMIN D3) 2000 UNITS TABS Take 1 tablet by mouth daily.        Marland Kitchen diltiazem (CARDIZEM CD) 240 MG 24 hr capsule TAKE 1 CAPSULE ONCE DAILY  90 capsule  3  . esomeprazole (NEXIUM) 40 MG capsule Take 40 mg by mouth daily before breakfast.        . fenofibrate 160 MG tablet Take 160 mg by mouth daily.        . fluticasone  (FLONASE) 50 MCG/ACT nasal spray SPRAY 2 SPRAYS IN BOTH   NOSTRILS  16 g  5  . furosemide (LASIX) 40 MG tablet TAKE ONE-HALF (1/2) TABLET DAILY  45 tablet  5  . losartan (COZAAR) 100 MG tablet Take 100 mg by mouth daily.        . methenamine (MANDELAMINE) 0.5 GM tablet Take 0.5 g by mouth 2 (two) times daily.        . methylcellulose (CITRUCEL) oral powder Take 1 tsp by mouth daily       . Multiple Vitamin (MULTIVITAMIN) capsule Take 1 capsule by mouth daily.        . potassium chloride SA (K-DUR,KLOR-CON) 20 MEQ tablet TAKE 1 TABLET ONCE DAILY  90 tablet  3  . sertraline (ZOLOFT) 100 MG tablet       . simvastatin (ZOCOR) 10 MG tablet Take 10 mg by mouth at bedtime.        Marland Kitchen warfarin (COUMADIN) 2.5 MG tablet Take as directed        Allergies  Allergen Reactions  . Metoclopramide Hcl     REACTION: causes uncontrolable leg movements  . Nitrofurantoin     REACTION: nausea    Current Medications, Allergies, Past Medical History, Past Surgical History, Family History, and Social History were reviewed in [employer] record.    Review of Systems    See HPI - all other systems neg except as noted...  The patient complains of dyspnea on exertion.  The patient denies anorexia, fever, weight loss, weight gain, vision loss, decreased hearing, hoarseness, chest pain, syncope, peripheral edema, prolonged cough, headaches, hemoptysis, abdominal pain, melena, hematochezia, severe indigestion/heartburn, hematuria, incontinence, muscle weakness, suspicious skin lesions, transient blindness, difficulty walking, depression, unusual weight change, abnormal bleeding, enlarged  lymph nodes, and angioedema.     Objective:   Physical Exam     WD, WN, 78 y/o WF in NAD GENERAL:  Alert & oriented; pleasant & cooperative... HEENT:  [place]/AT, EOM-wnl, PERRLA, EACs-clear, TMs-wnl, NOSE-clear, THROAT-clear & wnl. NECK:  Supple w/ decrROM; no JVD; normal carotid impulses w/o bruits; no  thyromegaly or nodules palpated; no lymphadenopathy. CHEST:  Clear to P & A; without wheezes/ rales/ or rhonchi. HEART:  regular rhythm; without murmurs/ rubs/ or gallops. ABDOMEN:  soft & nontender; normal bowel sounds; no organomegaly or masses detected. EXT: without deformities, mild arthritic changes; no varicose veins/ venous insuffic/ or edema. NEURO:  CN's intact;  no focal neuro deficits... DERM:  No lesions noted; no rash etc...  RADIOLOGY DATA:  Reviewed in the EPIC EMR & discussed w/ the patient...  LABORATORY DATA:  Reviewed in the EPIC EMR & discussed w/ the patient...   Assessment & Plan:   ASTHMA>  Stable on Advair, Ventolin; she remains active etc...  Hx DVT/ PTE/ Factor V defic>  Stable on Coumadin w/ protimes per DrFagin in [place]; continue same...  HBP>  Controlled on Cardizem, Losartan, Lasix; continue same...  CHOL>  Stable on Simva + Fenofib and diet;  Continue same...  LargeHH/ GERD>  On Nexium & followed by DrPatterson; continue same meds & vigorous antireflux program...  Divertics>  Stable lower GI but overdue for colonoscopy however she is now 78 y/o...  Renal Insuffic, Hx of Neurogenic Bladder>  Stable on current meds & voiding satis w/o need to self cath, she still takes Mandelamine for UTI prevention...  DJD, Hyperuricemia, DDD, Osteopenia>  She remains on Allopurinol, calcium, MVI, Vit D...  Anemia>  Hg remains stable at 11-12 range...  Anxiety/ Depression>  On ZOLOFT 100mg  taking 1/2 Qhs...   Patient's Medications  New Prescriptions   No medications on file  Previous Medications   ADVAIR DISKUS 100-50 MCG/DOSE AEPB    INHALE 1 DOSE BY MOUTH TWICE A DAY   ALLOPURINOL (ZYLOPRIM) 300 MG TABLET    Take 300 mg by mouth daily.     BIOTIN (SUPER BIOTIN) 5 MG CAPS    Take 1 capsule by mouth daily.     CALCIUM CITRATE-VITAMIN D (CITRACAL MAXIMUM) 315-250 MG-UNIT TABS    Take 1 capsule by mouth daily.   CHOLECALCIFEROL (VITAMIN D3) 2000 UNITS TABS     Take 1 tablet by mouth daily.     DILTIAZEM (CARDIZEM CD) 240 MG 24 HR CAPSULE    TAKE 1 CAPSULE ONCE DAILY   ESOMEPRAZOLE (NEXIUM) 40 MG CAPSULE    Take 40 mg by mouth daily before breakfast.     FENOFIBRATE 160 MG TABLET    Take 160 mg by mouth daily.     FLUTICASONE (FLONASE) 50 MCG/ACT NASAL SPRAY    SPRAY 2 SPRAYS IN BOTH   NOSTRILS   FUROSEMIDE (LASIX) 40 MG TABLET    TAKE ONE-HALF (1/2) TABLET DAILY   LOSARTAN (COZAAR) 100 MG TABLET    Take 100 mg by mouth daily.     METHENAMINE (MANDELAMINE) 0.5 GM TABLET    Take 0.5 g by mouth 2 (two) times daily.     METHYLCELLULOSE (CITRUCEL) ORAL POWDER    Take 1 tsp by mouth daily    MULTIPLE VITAMIN (MULTIVITAMIN) CAPSULE    Take 1 capsule by mouth daily.     POTASSIUM CHLORIDE SA (K-DUR,KLOR-CON) 20 MEQ TABLET    TAKE 1 TABLET ONCE DAILY   SIMVASTATIN (ZOCOR) 10  MG TABLET    Take 10 mg by mouth at bedtime.     WARFARIN (COUMADIN) 2.5 MG TABLET    Take as directed  Modified Medications   Modified Medication Previous Medication   SERTRALINE (ZOLOFT) 100 MG TABLET sertraline (ZOLOFT) 100 MG tablet          take 1 tablet by mouth once daily  Discontinued Medications   CALCIUM CARBONATE-VIT D-MIN (CALTRATE 600+D PLUS) 600-400 MG-UNIT PER TABLET    Take 1 tablet by mouth daily.     SERTRALINE (ZOLOFT) 100 MG TABLET    Take 50 mg by mouth daily.

## 2011-05-04 NOTE — Patient Instructions (Signed)
Today we updated your med list in our EPIC system...    Continue your current medications the same...  Today we did your follow up CXR & fasting blood work...    Please call the PHONE TREE in a few days for your results...    Dial [id number] & when prompted enter your patient number followed by the # symbol...    Your patient number is:  [phone]  Stay as active as possible...  Call for any questions...  Let's continue our 6 month follow up visits.Marland KitchenMarland Kitchen

## 2011-05-12 ENCOUNTER — Inpatient Hospital Stay (HOSPITAL_COMMUNITY)
Admission: AD | Admit: 2011-05-12 | Discharge: 2011-05-15 | DRG: 392 | Disposition: A | Discharge status: Home / Self Care | Payer: Medicare Other | Source: Ambulatory Visit | Attending: Internal Medicine | Admitting: Internal Medicine

## 2011-05-12 ENCOUNTER — Other Ambulatory Visit: Payer: Self-pay | Admitting: Internal Medicine

## 2011-05-12 ENCOUNTER — Inpatient Hospital Stay (HOSPITAL_COMMUNITY): Payer: Medicare Other

## 2011-05-12 DIAGNOSIS — K449 Diaphragmatic hernia without obstruction or gangrene: Secondary | ICD-10-CM | POA: Diagnosis present

## 2011-05-12 DIAGNOSIS — I129 Hypertensive chronic kidney disease with stage 1 through stage 4 chronic kidney disease, or unspecified chronic kidney disease: Secondary | ICD-10-CM | POA: Diagnosis present

## 2011-05-12 DIAGNOSIS — Z7901 Long term (current) use of anticoagulants: Secondary | ICD-10-CM

## 2011-05-12 DIAGNOSIS — K219 Gastro-esophageal reflux disease without esophagitis: Secondary | ICD-10-CM | POA: Diagnosis present

## 2011-05-12 DIAGNOSIS — N189 Chronic kidney disease, unspecified: Secondary | ICD-10-CM | POA: Diagnosis present

## 2011-05-12 DIAGNOSIS — I2789 Other specified pulmonary heart diseases: Secondary | ICD-10-CM | POA: Diagnosis present

## 2011-05-12 DIAGNOSIS — Z79899 Other long term (current) drug therapy: Secondary | ICD-10-CM

## 2011-05-12 DIAGNOSIS — K5732 Diverticulitis of large intestine without perforation or abscess without bleeding: Principal | ICD-10-CM | POA: Diagnosis present

## 2011-05-12 DIAGNOSIS — J45909 Unspecified asthma, uncomplicated: Secondary | ICD-10-CM | POA: Diagnosis present

## 2011-05-12 DIAGNOSIS — I7300 Raynaud's syndrome without gangrene: Secondary | ICD-10-CM | POA: Diagnosis present

## 2011-05-12 DIAGNOSIS — Z86711 Personal history of pulmonary embolism: Secondary | ICD-10-CM

## 2011-05-12 DIAGNOSIS — E785 Hyperlipidemia, unspecified: Secondary | ICD-10-CM | POA: Diagnosis present

## 2011-05-12 DIAGNOSIS — Z86718 Personal history of other venous thrombosis and embolism: Secondary | ICD-10-CM

## 2011-05-12 DIAGNOSIS — Z87891 Personal history of nicotine dependence: Secondary | ICD-10-CM

## 2011-05-12 LAB — DIFFERENTIAL
Basophils Absolute: 0 10*3/uL (ref 0.0–0.1)
Eosinophils Absolute: 0 10*3/uL (ref 0.0–0.7)
Lymphocytes Relative: 15 % (ref 12–46)
Lymphs Abs: 1.3 10*3/uL (ref 0.7–4.0)
Neutrophils Relative %: 76 % (ref 43–77)

## 2011-05-12 LAB — CBC
HCT: 34.9 % — ABNORMAL LOW (ref 36.0–46.0)
Hemoglobin: 11.6 g/dL — ABNORMAL LOW (ref 12.0–15.0)
MCH: 31.4 pg (ref 26.0–34.0)
MCHC: 33.2 g/dL (ref 30.0–36.0)
MCV: 94.3 fL (ref 78.0–100.0)

## 2011-05-12 LAB — COMPREHENSIVE METABOLIC PANEL
BUN: 36 mg/dL — ABNORMAL HIGH (ref 6–23)
CO2: 26 mEq/L (ref 19–32)
Calcium: 10.4 mg/dL (ref 8.4–10.5)
GFR calc Af Amer: 35 mL/min — ABNORMAL LOW (ref >90)
GFR calc non Af Amer: 31 mL/min — ABNORMAL LOW (ref >90)
Glucose, Bld: 127 mg/dL — ABNORMAL HIGH (ref 70–99)
Total Protein: 7.2 g/dL (ref 6.0–8.3)

## 2011-05-12 MED ORDER — ACETAMINOPHEN 650 MG RE SUPP: 650.0000 mg | Freq: Four times a day (QID) | RECTAL | Status: DC | PRN

## 2011-05-12 MED ORDER — ACETAMINOPHEN 325 MG PO TABS: 650.0000 mg | ORAL_TABLET | Freq: Four times a day (QID) | ORAL | Status: DC | PRN

## 2011-05-12 MED ORDER — SODIUM CHLORIDE 0.9 % IJ SOLN
INTRAMUSCULAR | Status: AC
  Filled 2011-05-12: qty 3

## 2011-05-12 MED ORDER — HYDROCODONE-ACETAMINOPHEN 5-325 MG PO TABS
1.0000 | ORAL_TABLET | ORAL | Status: DC | PRN
  Administered 2011-05-12 – 2011-05-13 (×2): 1 via ORAL
  Filled 2011-05-12 (×2): qty 1

## 2011-05-12 MED ORDER — FENOFIBRATE 160 MG PO TABS
160.0000 mg | ORAL_TABLET | Freq: Every day | ORAL | Status: DC
  Administered 2011-05-13 – 2011-05-15 (×3): 160 mg via ORAL
  Filled 2011-05-12 (×3): qty 1

## 2011-05-12 MED ORDER — ALLOPURINOL 300 MG PO TABS
300.0000 mg | ORAL_TABLET | Freq: Every day | ORAL | Status: DC
  Administered 2011-05-13 – 2011-05-15 (×3): 300 mg via ORAL
  Filled 2011-05-12 (×3): qty 1

## 2011-05-12 MED ORDER — FLUTICASONE-SALMETEROL 100-50 MCG/DOSE IN AEPB
INHALATION_SPRAY | RESPIRATORY_TRACT | Status: AC
  Filled 2011-05-12: qty 14

## 2011-05-12 MED ORDER — CIPROFLOXACIN IN D5W 400 MG/200ML IV SOLN
400.0000 mg | Freq: Two times a day (BID) | INTRAVENOUS | Status: DC
  Administered 2011-05-13: 400 mg via INTRAVENOUS
  Filled 2011-05-12 (×2): qty 200

## 2011-05-12 MED ORDER — DILTIAZEM HCL ER COATED BEADS 240 MG PO CP24
240.0000 mg | ORAL_CAPSULE | Freq: Every day | ORAL | Status: DC
  Administered 2011-05-13 – 2011-05-15 (×3): 240 mg via ORAL
  Filled 2011-05-12 (×3): qty 1

## 2011-05-12 MED ORDER — FLUTICASONE-SALMETEROL 100-50 MCG/DOSE IN AEPB
1.0000 | INHALATION_SPRAY | Freq: Two times a day (BID) | RESPIRATORY_TRACT | Status: DC
  Administered 2011-05-13 – 2011-05-14 (×4): 1 via RESPIRATORY_TRACT
  Filled 2011-05-12: qty 14

## 2011-05-12 MED ORDER — IOHEXOL 300 MG/ML  SOLN
100.0000 mL | Freq: Once | INTRAMUSCULAR | Status: AC | PRN
  Administered 2011-05-12: 100 mL via INTRAVENOUS

## 2011-05-12 MED ORDER — HYDROMORPHONE HCL PF 1 MG/ML IJ SOLN
0.5000 mg | INTRAMUSCULAR | Status: DC | PRN
  Filled 2011-05-12: qty 1

## 2011-05-12 MED ORDER — PANTOPRAZOLE SODIUM 40 MG PO TBEC
40.0000 mg | DELAYED_RELEASE_TABLET | Freq: Every day | ORAL | Status: DC
  Administered 2011-05-13 – 2011-05-15 (×3): 40 mg via ORAL
  Filled 2011-05-12 (×3): qty 1

## 2011-05-12 MED ORDER — WARFARIN SODIUM 1 MG PO TABS: 1.0000 mg | ORAL_TABLET | Freq: Every day | ORAL | Status: DC

## 2011-05-12 MED ORDER — ONDANSETRON HCL 4 MG PO TABS: 4.0000 mg | ORAL_TABLET | Freq: Four times a day (QID) | ORAL | Status: DC | PRN

## 2011-05-12 MED ORDER — SODIUM CHLORIDE 0.45 % IV SOLN
INTRAVENOUS | Status: DC
  Administered 2011-05-13: 18:00:00 via INTRAVENOUS

## 2011-05-12 MED ORDER — LOSARTAN POTASSIUM 50 MG PO TABS
100.0000 mg | ORAL_TABLET | Freq: Every day | ORAL | Status: DC
  Administered 2011-05-13 – 2011-05-15 (×3): 100 mg via ORAL
  Filled 2011-05-12 (×3): qty 2

## 2011-05-12 MED ORDER — CIPROFLOXACIN IN D5W 400 MG/200ML IV SOLN
INTRAVENOUS | Status: AC
  Filled 2011-05-12: qty 400

## 2011-05-12 MED ORDER — SIMVASTATIN 10 MG PO TABS
10.0000 mg | ORAL_TABLET | Freq: Every day | ORAL | Status: DC
  Administered 2011-05-12 – 2011-05-14 (×3): 10 mg via ORAL
  Filled 2011-05-12 (×3): qty 1

## 2011-05-12 MED ORDER — SERTRALINE HCL 50 MG PO TABS
50.0000 mg | ORAL_TABLET | Freq: Every day | ORAL | Status: DC
  Administered 2011-05-13: 50 mg via ORAL
  Filled 2011-05-12 (×2): qty 1

## 2011-05-12 MED ORDER — METRONIDAZOLE IN NACL 5-0.79 MG/ML-% IV SOLN
INTRAVENOUS | Status: AC
  Filled 2011-05-12: qty 200

## 2011-05-12 MED ORDER — FUROSEMIDE 20 MG PO TABS
20.0000 mg | ORAL_TABLET | Freq: Every day | ORAL | Status: DC
  Administered 2011-05-13 – 2011-05-15 (×3): 20 mg via ORAL
  Filled 2011-05-12 (×3): qty 1

## 2011-05-12 MED ORDER — ALUM & MAG HYDROXIDE-SIMETH 200-200-20 MG/5ML PO SUSP: 30.0000 mL | Freq: Four times a day (QID) | ORAL | Status: DC | PRN

## 2011-05-12 MED ORDER — FENOFIBRATE 145 MG PO TABS
ORAL_TABLET | ORAL | Status: AC
  Filled 2011-05-12: qty 1

## 2011-05-12 MED ORDER — ONDANSETRON HCL 4 MG/2ML IJ SOLN
4.0000 mg | Freq: Four times a day (QID) | INTRAMUSCULAR | Status: DC | PRN
  Administered 2011-05-14: 4 mg via INTRAVENOUS
  Filled 2011-05-12: qty 2

## 2011-05-12 MED ORDER — METHENAMINE MANDELATE 1 G PO TABS
0.5000 g | ORAL_TABLET | Freq: Two times a day (BID) | ORAL | Status: DC
  Administered 2011-05-13 – 2011-05-14 (×3): 0.5 g via ORAL
  Filled 2011-05-12 (×7): qty 1

## 2011-05-12 MED ORDER — METRONIDAZOLE IN NACL 5-0.79 MG/ML-% IV SOLN
500.0000 mg | Freq: Three times a day (TID) | INTRAVENOUS | Status: DC
  Administered 2011-05-13 – 2011-05-15 (×7): 500 mg via INTRAVENOUS
  Filled 2011-05-12 (×18): qty 100

## 2011-05-12 MED ORDER — POTASSIUM CHLORIDE CRYS ER 20 MEQ PO TBCR
20.0000 meq | EXTENDED_RELEASE_TABLET | Freq: Every day | ORAL | Status: DC
  Administered 2011-05-13 – 2011-05-15 (×3): 20 meq via ORAL
  Filled 2011-05-12 (×2): qty 1

## 2011-05-12 NOTE — Plan of Care (Signed)
Problem: Phase I Progression Outcomes Goal: Initial discharge plan identified Outcome: Completed/Met Date Met:  05/12/11 Pt plans to be d/c home with family

## 2011-05-13 ENCOUNTER — Encounter: Payer: Self-pay | Admitting: Pulmonary Disease

## 2011-05-13 LAB — PROTIME-INR
INR: 2.31 — ABNORMAL HIGH (ref 0.00–1.49)
Prothrombin Time: 25.8 seconds — ABNORMAL HIGH (ref 11.6–15.2)

## 2011-05-13 MED ORDER — WARFARIN SODIUM 2.5 MG PO TABS
2.5000 mg | ORAL_TABLET | Freq: Every day | ORAL | Status: DC
  Administered 2011-05-13 – 2011-05-14 (×2): 2.5 mg via ORAL
  Filled 2011-05-13 (×2): qty 1

## 2011-05-13 MED ORDER — CIPROFLOXACIN IN D5W 400 MG/200ML IV SOLN
400.0000 mg | INTRAVENOUS | Status: DC
Start: 2011-05-14 — End: 2011-05-15
  Administered 2011-05-14 – 2011-05-15 (×2): 400 mg via INTRAVENOUS
  Filled 2011-05-13 (×5): qty 200

## 2011-05-13 NOTE — Progress Notes (Signed)
NAMECHATTIE, Hicks NO.:  [id number]  MEDICAL RECORD NO.:  [id number]  LOCATION:  A330                          FACILITY:  [facility]  PHYSICIAN:  Kingsley Callander. Ouida Sills, MD       DATE OF BIRTH:  1926-12-09  DATE OF PROCEDURE: DATE OF DISCHARGE:                                PROGRESS NOTE   [name] is feeling better this morning.  She has had less pain than she did yesterday.  She has very little appetite.  Her temperature has been normal overnight.  PHYSICAL EXAMINATION:  VITAL SIGNS:  Normal this morning.  Temperature is 97.6 with a pulse of 66, respirations 20, blood pressure of 117/62, and oxygen saturation is 96%. LUNGS:  Clear. HEART:  Regular with no murmurs. ABDOMEN:  Tender in the left lower quadrant, but less tender than yesterday.  Bowel sounds are normal.  IMPRESSION/PLAN: 1. Diverticulitis.  Continue Cipro and Flagyl.  Cipro dose may need to     be adjusted based on her renal function.  We will ask the     pharmacist to adjust this if necessary. 2. History of deep vein thrombosis.  She is well anticoagulated with     an INR of 2.31.  She had an INR of 2.37 yesterday, which was her     monthly check.  She did not receive a dose of Coumadin last night.     3.  History of asthma and pulmonary hypertension, stable.     Kingsley Callander. Ouida Sills, MD     ROF/MEDQ  D:  05/13/2011  T:  05/13/2011  Job:  [id number]

## 2011-05-13 NOTE — H&P (Signed)
NAMEMOSELLA, KASA NO.:  [id number]  MEDICAL RECORD NO.:  [id number]  LOCATION:  A330                          FACILITY:  [facility]  PHYSICIAN:  Kingsley Callander. Ouida Sills, MD       DATE OF BIRTH:  [birth year]  DATE OF ADMISSION:  05/12/2011 DATE OF DISCHARGE:  LH                             HISTORY & PHYSICAL   CHIEF COMPLAINT:  Abdominal pain.  HISTORY OF PRESENT ILLNESS:  This patient is an 78 year old white female, who presented to my office complaining of a 1-day history of pain in her left lower quadrant.  She had a bowel movement 1 day prior to admission without changes.  She denied any rectal bleeding.  She has not had diarrhea or constipation.  She denied any nausea or vomiting. She has had a fever at home to 101.  In the office, she was found to have exquisite tenderness in the left lower quadrant, felt to likely be consistent with diverticulitis.  She has previously had 2 episodes of diverticulitis.  She has known diverticulosis.  PAST MEDICAL HISTORY: 1. DVT and pulmonary embolus in 2005. 2. Asthma. 3. Pulmonary hypertension. 4. Hypertension. 5. Hyperlipidemia. 6. History of benign right parotid tumor. 7. Neurogenic bladder. 8. Large hiatal hernia. 9. Barrett esophagus. 10.Raynaud phenomenon. 11.Cholecystectomy.  MEDICATIONS: 1. Allopurinol 300 mg daily. 2. Diltiazem CD 240 mg daily. 3. Fenofibrate 160 mg daily. 4. Advair 100/50 one puff b.i.d. 5. Lasix 20 mg daily. 6. Losartan 100 mg daily. 7. Methenamine 0.5 g b.i.d. 8. Nexium 40 mg daily. 9. Potassium 20 mEq daily. 10.Sertraline 50 mg daily. 11.Simvastatin 10 mg daily. 12.Coumadin 2.5 mg on Monday, Wednesday, and Friday, and 1.25 mg on     Tuesday, Thursday, Saturday, and Sunday. 13.Biotin 5 mg daily. 14.Calcium citrate-vitamin D 315-250 mg daily. 15.Vitamin D3 2000 units daily. 16.Flonase 2 sprays in each nostril daily. 17.Citrucel 1 teaspoons daily. 18.Multivitamin daily.  ALLERGIES:   REGLAN and MACROBID.  SOCIAL HISTORY:  She is a former smoker.  She does not drink or use drugs.  FAMILY HISTORY:  Her father had coronary artery disease.  Her mother had Parkinson disease.  REVIEW OF SYSTEMS:  Noncontributory.  PHYSICAL EXAMINATION:  VITAL SIGNS:  Temperature 97.6, pulse 66, respirations 20, blood pressure 103/61, oxygen saturation 95%. GENERAL:  Alert, in no distress. HEENT:  No scleral icterus.  Oropharynx is moist. NECK:  Supple with no JVD or thyromegaly. LUNGS:  Clear. HEART:  Regular with no murmurs. ABDOMEN:  Tender in the left lower quadrant.  No palpable mass.  No hepatosplenomegaly.  Bowel sounds are normal. EXTREMITIES:  No calf tenderness or swelling.  No cyanosis, clubbing, or edema. NEURO:  Grossly intact. LYMPH NODES:  No cervical or supraclavicular enlargement.  LABORATORY DATA:  White count 8.9, hemoglobin 11.6, platelets 214,000. Sodium 134, potassium 4.6, bicarb 26, BUN 36, creatinine 1.52, glucose 127.  AST 20, ALT 11, bilirubin 0.6.  INR 2.3.  IMPRESSION/PLAN: 1. Acute diverticulitis.  In the office, she had exquisite tenderness     and the tachycardia up to 108.  She was not felt to be an     appropriate candidate for outpatient therapy and therefore was  admitted for IV antibiotic therapy, and a CT scan of her abdomen to     rule out a perforation or abscess formation.  Her CT scan     fortunately shows acute diverticulitis without perforation or     abscess.  She will be treated with Cipro and Flagyl. 2. Chronic renal insufficiency, stable. 3. Asthma and pulmonary hypertension, stable. 4. History of pulmonary embolus and right leg deep venous thrombosis,     continue Coumadin. 5. Hypertension, well controlled. 6. Hyperlipidemia. 7. Gastroesophageal reflux disease.     Kingsley Callander. Ouida Sills, MD     ROF/MEDQ  D:  05/13/2011  T:  05/13/2011  Job:  [id number]  cc:   Lonzo Cloud. Kriste Basque, MD [address] [place] [address]

## 2011-05-13 NOTE — Progress Notes (Signed)
Slept well. No changes from PM assessment. CT of abdomen completed. No complaints of pain this shift. No pain medication given or requested. Uneventful shift.

## 2011-05-14 LAB — BASIC METABOLIC PANEL
BUN: 35 mg/dL — ABNORMAL HIGH (ref 6–23)
CO2: 26 mEq/L (ref 19–32)
Chloride: 104 mEq/L (ref 96–112)
GFR calc non Af Amer: 33 mL/min — ABNORMAL LOW (ref >90)
Glucose, Bld: 137 mg/dL — ABNORMAL HIGH (ref 70–99)
Potassium: 4.4 mEq/L (ref 3.5–5.1)
Sodium: 137 mEq/L (ref 135–145)

## 2011-05-14 MED ORDER — SODIUM CHLORIDE 0.9 % IJ SOLN
INTRAMUSCULAR | Status: AC
  Administered 2011-05-14: 3 mL via INTRAVENOUS
  Filled 2011-05-14: qty 3

## 2011-05-14 MED ORDER — SODIUM CHLORIDE 0.9 % IJ SOLN
3.0000 mL | Freq: Two times a day (BID) | INTRAMUSCULAR | Status: DC
  Administered 2011-05-14 – 2011-05-15 (×2): 3 mL via INTRAVENOUS
  Filled 2011-05-14 (×2): qty 3

## 2011-05-14 MED ORDER — SODIUM CHLORIDE 0.9 % IJ SOLN
3.0000 mL | INTRAMUSCULAR | Status: DC | PRN
  Administered 2011-05-14: 3 mL via INTRAVENOUS

## 2011-05-14 MED ORDER — SERTRALINE HCL 50 MG PO TABS
50.0000 mg | ORAL_TABLET | Freq: Every day | ORAL | Status: DC
  Administered 2011-05-14: 50 mg via ORAL
  Filled 2011-05-14: qty 1

## 2011-05-14 MED ORDER — SODIUM CHLORIDE 0.9 % IV SOLN: 250.0000 mL | INTRAVENOUS | Status: DC | PRN

## 2011-05-14 NOTE — Progress Notes (Addendum)
ANTIBIOTIC CONSULT NOTE -  (MD managing Warfarin)  Pharmacy Consult for Ciprofloxacin Indication: Diverticulitis  Allergies  Allergen Reactions  . Metoclopramide Hcl Other (See Comments)    REACTION: causes uncontrolable leg movements  . Nitrofurantoin Nausea And Vomiting and Other (See Comments)    REACTION: dizziness    Patient Measurements: Height: 5' (152.4 cm) Weight: 141 lb (63.957 kg) IBW/kg (Calculated) : 45.5  Adjusted Body Weight:   Vital Signs: Temp: 98.2 F (36.8 C) (02/14 0517) Temp src: Oral (02/14 0517) BP: 107/63 mmHg (02/14 0517) Pulse Rate: 68  (02/14 0517) Intake/Output from previous day: 02/13 0701 - 02/14 0700 In: 2785 [P.O.:660; I.V.:2125] Out: -  Intake/Output from this shift:    Labs:  Basename 05/14/11 0510 05/12/11 1755  WBC -- 8.9  HGB -- 11.6*  PLT -- 214  LABCREA -- --  CREATININE 1.41* 1.52*   Estimated Creatinine Clearance: 25.2 ml/min (by C-G formula based on Cr of 1.41).    Microbiology: No results found for this or any previous visit (from the past 720 hour(s)).  Medical History: Past Medical History  Diagnosis Date  . Unspecified asthma   . Personal history of venous thrombosis and embolism   . Unspecified essential hypertension   . Personal history of thrombophlebitis   . Pure hypercholesterolemia   . Esophageal reflux   . Diverticulosis of colon (without mention of hemorrhage)   . Irritable bowel syndrome   . Unspecified disorder resulting from impaired renal function   . Neurogenic bladder   . Osteoarthrosis, unspecified whether generalized or localized, unspecified site   . Degeneration of cervical intervertebral disc   . Disorder of bone and cartilage, unspecified   . Anxiety state, unspecified   . Anemia, unspecified   . Congenital deficiency of other clotting factors   . Esophageal stricture     Medications:  Scheduled:    . allopurinol  300 mg Oral Daily  . ciprofloxacin  400 mg Intravenous Q24H  .  diltiazem  240 mg Oral Daily  . fenofibrate  160 mg Oral Daily  . Fluticasone-Salmeterol  1 puff Inhalation BID  . furosemide  20 mg Oral Daily  . losartan  100 mg Oral Daily  . methenamine  0.5 g Oral BID  . metronidazole  500 mg Intravenous Q8H  . pantoprazole  40 mg Oral Daily  . potassium chloride SA  20 mEq Oral Daily  . sertraline  50 mg Oral Daily  . simvastatin  10 mg Oral QHS  . warfarin  2.5 mg Oral q1800   Assessment: Empiric therapy. Renal function parameters are elevated but improving,  Goal of Therapy:   Eradicate infection.  Plan:  Ciprofloxacin 400 mg IV every 24 hours. Will follow up labs and adjust dose as needed.  Caryl Asp 05/14/2011,9:46 AM

## 2011-05-14 NOTE — Progress Notes (Signed)
Nancy Hicks, BOENING NO.:  [id number]  MEDICAL RECORD NO.:  [id number]  LOCATION:  A330                          FACILITY:  [facility]  PHYSICIAN:  Kingsley Callander. Ouida Sills, MD       DATE OF BIRTH:  03-28-1927  DATE OF PROCEDURE: DATE OF DISCHARGE:                                PROGRESS NOTE   Ms. Goh has rested comfortably overnight.  She did not require any pain medications.  She has not had fever.  When she got up this morning to wash up, she developed nausea and clamminess, and went back to lie down in the bed.  She did not vomit.  Her abdominal pain has improved.  PHYSICAL EXAMINATION:  VITAL SIGNS:  This morning revealed temperature of 98.2, pulse 68, respirations 18, blood pressure 107/63. LUNGS:  Clear. HEART:  Regular with no murmurs. ABDOMEN:  Tender in the left lower quadrant.  Her degree of tenderness remains quite significant.  Bowel sounds are normal.  IMPRESSION/PLAN: 1. Diverticulitis.  Continue Cipro and Flagyl. 2. History of pulmonary embolism and deep vein thrombosis.  Continue     Coumadin.  INR is 2.0. 3. Chronic kidney disease.  BUN and creatinine are stable at 35 and     1.41, potassium is 4.4.  She continues to have very little appetite since her discomfort persists at this level, and she felt nausea and clamminess this morning.  She will be continued on IV antibiotics for now.     Kingsley Callander. Ouida Sills, MD     ROF/MEDQ  D:  05/14/2011  T:  05/14/2011  Job:  [id number]

## 2011-05-15 LAB — PROTIME-INR: INR: 2.1 — ABNORMAL HIGH (ref 0.00–1.49)

## 2011-05-15 LAB — BASIC METABOLIC PANEL
CO2: 26 mEq/L (ref 19–32)
Calcium: 9.4 mg/dL (ref 8.4–10.5)
Chloride: 107 mEq/L (ref 96–112)
Glucose, Bld: 115 mg/dL — ABNORMAL HIGH (ref 70–99)
Potassium: 4.6 mEq/L (ref 3.5–5.1)
Sodium: 139 mEq/L (ref 135–145)

## 2011-05-15 MED ORDER — CIPROFLOXACIN HCL 500 MG PO TABS: 500.0000 mg | ORAL_TABLET | Freq: Two times a day (BID) | ORAL | Status: AC

## 2011-05-15 MED ORDER — METRONIDAZOLE 500 MG PO TABS: 500.0000 mg | ORAL_TABLET | Freq: Three times a day (TID) | ORAL | Status: AC

## 2011-05-15 NOTE — Discharge Summary (Signed)
[name], Nancy Hicks NO.:  [id number]  MEDICAL RECORD NO.:  [id number]  LOCATION:  A330                          FACILITY:  [facility]  PHYSICIAN:  Kingsley Callander. Ouida Sills, MD       DATE OF BIRTH:  [birth year]  DATE OF ADMISSION:  05/12/2011 DATE OF DISCHARGE:  02/15/2013LH                              DISCHARGE SUMMARY   DISCHARGE DIAGNOSES: 1. Diverticulitis. 2. History of deep venous thrombosis and pulmonary embolus. 3. Hypertension. 4. Hyperlipidemia. 5. Pulmonary hypertension. 6. Gastroesophageal reflux disease.  DISCHARGE MEDICATIONS: 1. Cipro 500 mg b.i.d. for 7 more days. 2. Flagyl 500 mg t.i.d. for 7 more days. 3. Allopurinol 300 mg daily. 4. Fenofibrate 160 mg daily. 5. Losartan 100 mg daily. 6. Nexium 40 mg daily. 7. Zocor 10 mg daily. 8. Biotin 5 mg daily. 9. Vitamin D3 2000 International Units daily. 10.Fluticasone 50 mcg 2 sprays in each nostril daily. 11.Cardizem CD 240 mg daily. 12.Potassium 20 mEq daily. 13.Citrucel 1 teaspoon daily. 14.Advair 100/50 one puff b.i.d. 15.Lasix 20 mg daily. 16.Calcium and vitamin D 315/250 daily. 17.Albuterol 2 puffs q.6h. p.r.n. 18.Zoloft 50 mg daily. 19.Coumadin 2.5 mg on Monday, Wednesday, Friday  and 1.25 mg on other     days. 20.Multivitamin daily. 21.Methenamine 500 mg b.i.d. 22.Systane 2 drops in both eyes b.i.d.  HOSPITAL COURSE:  This patient is an 78 year old female who presented with left lower quadrant pain.  On exam in the office, her pain was quite severe.  She was hospitalized and underwent a CT scan of the abdomen, which revealed acute diverticulitis.  There was no sign of abscess or perforation.  She was treated with IV Cipro and Flagyl.  Her pain significantly improved and she was felt to be stable for discharge on the morning of May 15, 2011.  She will be seen in followup in my office in 1 week.  She will continue oral antibiotic therapy.     Kingsley Callander. Ouida Sills, MD     ROF/MEDQ  D:   05/15/2011  T:  05/15/2011  Job:  [id number]

## 2011-05-15 NOTE — Progress Notes (Signed)
Writer reviewed and discussed medications to pt, verbalized understanding.  Encouraged to call with any questions that may arise.  Pt packed and took all belongings.  Daughter accompanied pt and staff member to main entrance.  Pt wheel chaired out in stable condition and in no distress.

## 2011-05-23 ENCOUNTER — Other Ambulatory Visit: Payer: Self-pay | Admitting: Gastroenterology

## 2011-05-26 ENCOUNTER — Telehealth: Payer: Self-pay | Admitting: Gastroenterology

## 2011-05-26 MED ORDER — ESOMEPRAZOLE MAGNESIUM 40 MG PO CPDR: DELAYED_RELEASE_CAPSULE | ORAL | Status: DC

## 2011-05-26 NOTE — Telephone Encounter (Signed)
rx sent pt aware she must keep her ov

## 2011-06-04 ENCOUNTER — Ambulatory Visit: Payer: Medicare Other | Admitting: Gastroenterology

## 2011-06-06 ENCOUNTER — Other Ambulatory Visit: Payer: Self-pay | Admitting: Pulmonary Disease

## 2011-06-08 ENCOUNTER — Encounter: Payer: Self-pay | Admitting: *Deleted

## 2011-06-09 ENCOUNTER — Ambulatory Visit (INDEPENDENT_AMBULATORY_CARE_PROVIDER_SITE_OTHER): Payer: Medicare Other | Admitting: Gastroenterology

## 2011-06-09 ENCOUNTER — Encounter: Payer: Self-pay | Admitting: Gastroenterology

## 2011-06-09 VITALS — BP 130/70 | HR 84 | Ht 60.0 in | Wt 142.2 lb

## 2011-06-09 DIAGNOSIS — Z7901 Long term (current) use of anticoagulants: Secondary | ICD-10-CM

## 2011-06-09 DIAGNOSIS — K219 Gastro-esophageal reflux disease without esophagitis: Secondary | ICD-10-CM

## 2011-06-09 DIAGNOSIS — S2231XA Fracture of one rib, right side, initial encounter for closed fracture: Secondary | ICD-10-CM | POA: Insufficient documentation

## 2011-06-09 DIAGNOSIS — J4489 Other specified chronic obstructive pulmonary disease: Secondary | ICD-10-CM | POA: Insufficient documentation

## 2011-06-09 DIAGNOSIS — J449 Chronic obstructive pulmonary disease, unspecified: Secondary | ICD-10-CM

## 2011-06-09 DIAGNOSIS — K5732 Diverticulitis of large intestine without perforation or abscess without bleeding: Secondary | ICD-10-CM

## 2011-06-09 MED ORDER — SACCHAROMYCES BOULARDII 250 MG PO CAPS: ORAL_CAPSULE | ORAL | Status: DC

## 2011-06-09 MED ORDER — TRAMADOL HCL 50 MG PO TABS: 50.0000 mg | ORAL_TABLET | Freq: Three times a day (TID) | ORAL | Status: DC | PRN

## 2011-06-09 MED ORDER — ESOMEPRAZOLE MAGNESIUM 40 MG PO CPDR: DELAYED_RELEASE_CAPSULE | ORAL | Status: DC

## 2011-06-09 NOTE — Progress Notes (Signed)
Addended by: Harlow Mares D on: 06/09/2011 01:37 PM   Modules accepted: Orders

## 2011-06-09 NOTE — Patient Instructions (Signed)
Your prescription(s) have been sent to you pharmacy.  Buy Florastor OTC and take once a day for two weeks.   Diverticulosis Diverticulosis is a common condition that develops when small pouches (diverticula) form in the wall of the colon. The risk of diverticulosis increases with age. It happens more often in people who eat a low-fiber diet. Most individuals with diverticulosis have no symptoms. Those individuals with symptoms usually experience abdominal pain, constipation, or loose stools (diarrhea). HOME CARE INSTRUCTIONS   Increase the amount of fiber in your diet as directed by your caregiver or dietician. This may reduce symptoms of diverticulosis.   Your caregiver may recommend taking a dietary fiber supplement.   Drink at least 6 to 8 glasses of water each day to prevent constipation.   Try not to strain when you have a bowel movement.   Your caregiver may recommend avoiding nuts and seeds to prevent complications, although this is still an uncertain benefit.   Only take over-the-counter or prescription medicines for pain, discomfort, or fever as directed by your caregiver.  FOODS WITH HIGH FIBER CONTENT INCLUDE:  Fruits. Apple, peach, pear, tangerine, raisins, prunes.   Vegetables. Brussels sprouts, asparagus, broccoli, cabbage, carrot, cauliflower, romaine lettuce, spinach, summer squash, tomato, winter squash, zucchini.   Starchy Vegetables. Baked beans, kidney beans, lima beans, split peas, lentils, potatoes (with skin).   Grains. Whole wheat bread, brown rice, bran flake cereal, plain oatmeal, white rice, shredded wheat, bran muffins.  SEEK IMMEDIATE MEDICAL CARE IF:   You develop increasing pain or severe bloating.   You have an oral temperature above 102 F (38.9 C), not controlled by medicine.   You develop vomiting or bowel movements that are bloody or black.  Document Released: 12/12/2003 Document Revised: 03/05/2011 Document Reviewed: 08/14/2009 [facility]  Patient Information 2012 [place], [place].     Diverticulitis A diverticulum is a small pouch or sac on the colon. Diverticulosis is the presence of these diverticula on the colon. Diverticulitis is the irritation (inflammation) or infection of diverticula. CAUSES  The colon and its diverticula contain bacteria. If food particles block the tiny opening to a diverticulum, the bacteria inside can grow and cause an increase in pressure. This leads to infection and inflammation and is called diverticulitis. SYMPTOMS   Abdominal pain and tenderness. Usually, the pain is located on the left side of your abdomen. However, it could be located elsewhere.   Fever.   Bloating.   Feeling sick to your stomach (nausea).   Throwing up (vomiting).   Abnormal stools.  DIAGNOSIS  Your caregiver will take a history and perform a physical exam. Since many things can cause abdominal pain, other tests may be necessary. Tests may include:  Blood tests.   Urine tests.   X-ray of the abdomen.   CT scan of the abdomen.  Sometimes, surgery is needed to determine if diverticulitis or other conditions are causing your symptoms. TREATMENT  Most of the time, you can be treated without surgery. Treatment includes:  Resting the bowels by only having liquids for a few days. As you improve, you will need to eat a low-fiber diet.   Intravenous (IV) fluids if you are losing body fluids (dehydrated).   Antibiotic medicines that treat infections may be given.   Pain and nausea medicine, if needed.   Surgery if the inflamed diverticulum has burst.  HOME CARE INSTRUCTIONS   Try a clear liquid diet (broth, tea, or water for as long as directed by your caregiver). You  may then gradually begin a low-fiber diet as tolerated. A low-fiber diet is a diet with less than 10 grams of fiber. Choose the foods below to reduce fiber in the diet:   White breads, cereals, rice, and pasta.   Cooked fruits and vegetables or  soft fresh fruits and vegetables without the skin.   Ground or well-cooked tender beef, ham, veal, lamb, pork, or poultry.   Eggs and seafood.   After your diverticulitis symptoms have improved, your caregiver may put you on a high-fiber diet. A high-fiber diet includes 14 grams of fiber for every 1000 calories consumed. For a standard 2000 calorie diet, you would need 28 grams of fiber. Follow these diet guidelines to help you increase the fiber in your diet. It is important to slowly increase the amount fiber in your diet to avoid gas, constipation, and bloating.   Choose whole-grain breads, cereals, pasta, and brown rice.   Choose fresh fruits and vegetables with the skin on. Do not overcook vegetables because the more vegetables are cooked, the more fiber is lost.   Choose more nuts, seeds, legumes, dried peas, beans, and lentils.   Look for food products that have greater than 3 grams of fiber per serving on the Nutrition Facts label.   Take all medicine as directed by your caregiver.   If your caregiver has given you a follow-up appointment, it is very important that you go. Not going could result in lasting (chronic) or permanent injury, pain, and disability. If there is any problem keeping the appointment, call to reschedule.  SEEK MEDICAL CARE IF:   Your pain does not improve.   You have a hard time advancing your diet beyond clear liquids.   Your bowel movements do not return to normal.  SEEK IMMEDIATE MEDICAL CARE IF:   Your pain becomes worse.   You have an oral temperature above 102 F (38.9 C), not controlled by medicine.   You have repeated vomiting.   You have bloody or black, tarry stools.   Symptoms that brought you to your caregiver become worse or are not getting better.  MAKE SURE YOU:   Understand these instructions.   Will watch your condition.   Will get help right away if you are not doing well or get worse.  Document Released: 12/24/2004 Document  Revised: 03/05/2011 Document Reviewed: 04/21/2010 [facility] Patient Information 2012 [place], [place].

## 2011-06-09 NOTE — Progress Notes (Signed)
This is a extremely nice 78 year old Caucasian female recently admitted by Dr. Carylon Perches in mid February for acute left lower quadrant pain felt secondary to diverticulitis confirmed by abdominal CT scan. She has completed treatment with metronidazole and Cipro and is currently asymptomatic. She follows a high fiber diet and uses daily Citrucel. The patient relates that she's had 3 episodes of diverticulitis the last 2 years. She denies rectal bleeding, melena, nausea and vomiting, but does have acid reflux control with Nexium 40 mg a day. Recent radiographs  showed a large hiatal hernia. There is no history of dysphagia, hepatobiliary complaints, fever or chills. The patient is on multiple medications listed and reviewed, and these include daily Coumadin, cardiac medications, and inhalers for COPD.  Current Medications, Allergies, Past Medical History, Past Surgical History, Family History and Social History were reviewed in [employer] record.  Pertinent Review of Systems Negative... she reports right upper quadrant musculoskeletal pain precipitated by heavy lifting and turning. The pain does not radiate into her back, and she denies any genitourinary or pulmonary complaints.   Physical Exam: Awake and alert in no acute distress. Blood pressure 130/70 and pulse is 84 and regular. BMI 27.78. Cannot appreciate stigmata of chronic liver disease. Her chest is entirely clear with no consolidation in the right lower lobe area. I cannot appreciate hepatosplenomegaly, abdominal masses or tenderness. Bowel sounds are normal. Mental status is normal. She appears to be in a regular cardiac rhythm without significant murmurs gallops or rubs. Point tenderness over right lateral rib area without any crepitus, masses, or other abnormalities.    Assessment and Plan: Resolved acute diverticulitis in an elderly patient with multiple cardiovascular and pulmonary problems. She is a poor candidate for  surgery and/or endoscopic exams. We will check IFOB stool cards for occult blood, continued fiber as tolerated, and I prescribed when necessary tramadol 50 mg every 8 to 12 hours for her musculoskeletal right upper quadrant pain probably related to osteoporosis and traumatic rib fracture. Her chest is entirely clear on pulmonary exam. Her GERD is well controlled on Nexium which was renewed today. Do not think colonoscopy exam at this time is indicated. We will see her in GI on a when necessary basis as needed.  Please copy her primary care physician, referring physician, and pertinent subspecialists. No diagnosis found.

## 2011-06-10 ENCOUNTER — Encounter: Payer: Self-pay | Admitting: Gastroenterology

## 2011-06-15 ENCOUNTER — Other Ambulatory Visit: Payer: Medicare Other

## 2011-06-15 DIAGNOSIS — K5732 Diverticulitis of large intestine without perforation or abscess without bleeding: Secondary | ICD-10-CM

## 2011-06-16 DIAGNOSIS — K5730 Diverticulosis of large intestine without perforation or abscess without bleeding: Secondary | ICD-10-CM

## 2011-06-16 DIAGNOSIS — D649 Anemia, unspecified: Secondary | ICD-10-CM

## 2011-06-16 DIAGNOSIS — K219 Gastro-esophageal reflux disease without esophagitis: Secondary | ICD-10-CM

## 2011-06-16 LAB — FECAL OCCULT BLOOD, IMMUNOCHEMICAL: Fecal Occult Bld: NEGATIVE

## 2011-06-21 ENCOUNTER — Other Ambulatory Visit: Payer: Self-pay | Admitting: Pulmonary Disease

## 2011-07-27 DIAGNOSIS — I2699 Other pulmonary embolism without acute cor pulmonale: Secondary | ICD-10-CM | POA: Insufficient documentation

## 2011-07-27 DIAGNOSIS — I82409 Acute embolism and thrombosis of unspecified deep veins of unspecified lower extremity: Secondary | ICD-10-CM | POA: Insufficient documentation

## 2011-08-06 ENCOUNTER — Other Ambulatory Visit: Payer: Self-pay | Admitting: Dermatology

## 2011-08-17 ENCOUNTER — Other Ambulatory Visit: Payer: Self-pay | Admitting: Pulmonary Disease

## 2011-09-02 ENCOUNTER — Other Ambulatory Visit: Payer: Self-pay | Admitting: Pulmonary Disease

## 2011-09-04 ENCOUNTER — Other Ambulatory Visit: Payer: Self-pay | Admitting: Pulmonary Disease

## 2011-09-21 ENCOUNTER — Telehealth: Payer: Self-pay | Admitting: Gastroenterology

## 2011-09-21 DIAGNOSIS — K219 Gastro-esophageal reflux disease without esophagitis: Secondary | ICD-10-CM

## 2011-09-21 NOTE — Telephone Encounter (Signed)
Pt called to question Florastor, should she take this all the time? I informed her per OV on 06/09/11, she was to take it daily for 2 weeks only. Pt then reported she continues to have indigestion almost every morning. She used to take 3 Nexium daily, but on 04/09/10 EGD, she was cut back to 1 daily /t candida. Any advice? Thanks.

## 2011-09-21 NOTE — Telephone Encounter (Signed)
PPI 1 A DAY,STOP FLORSTAR,NEDDS MANOMETRY AND 24H PH STUDY ON RX.Marland KitchenMarland Kitchen

## 2011-09-22 NOTE — Telephone Encounter (Signed)
Informed pt to take Nexium once daily and Dr Jarold Motto would like to schedule her for further tests: Esophageal Manometry and 24 hr PH study; pt stated understanding. Informed pt her EM and 24 hr probe is on 10/12/11 at 0900am; she needs to check in at Admitting at [facility] at 0845am and NPO after midnight. Pt stated understanding.

## 2011-10-05 ENCOUNTER — Telehealth: Payer: Self-pay | Admitting: Gastroenterology

## 2011-10-05 NOTE — Telephone Encounter (Signed)
Pt would like to discuss the EM with Dr Jarold Motto. R/S the EM to 10/25/11 and she will see Dr Jarold Motto on 10/13/11.

## 2011-10-13 ENCOUNTER — Encounter: Payer: Self-pay | Admitting: Gastroenterology

## 2011-10-13 ENCOUNTER — Ambulatory Visit (INDEPENDENT_AMBULATORY_CARE_PROVIDER_SITE_OTHER): Payer: Medicare Other | Admitting: Gastroenterology

## 2011-10-13 VITALS — BP 122/60 | HR 88 | Ht 60.0 in | Wt 143.0 lb

## 2011-10-13 DIAGNOSIS — K219 Gastro-esophageal reflux disease without esophagitis: Secondary | ICD-10-CM

## 2011-10-13 DIAGNOSIS — Z5181 Encounter for therapeutic drug level monitoring: Secondary | ICD-10-CM

## 2011-10-13 DIAGNOSIS — R1310 Dysphagia, unspecified: Secondary | ICD-10-CM

## 2011-10-13 DIAGNOSIS — K449 Diaphragmatic hernia without obstruction or gangrene: Secondary | ICD-10-CM

## 2011-10-13 DIAGNOSIS — Z7901 Long term (current) use of anticoagulants: Secondary | ICD-10-CM

## 2011-10-13 MED ORDER — HYOSCYAMINE SULFATE 0.125 MG SL SUBL: 0.1250 mg | SUBLINGUAL_TABLET | SUBLINGUAL | Status: DC | PRN

## 2011-10-13 NOTE — Patient Instructions (Addendum)
Your Barium Swallow is scheduled at [facility] on 10/19/2011 at 10:30am to arrive at 10:15am We have sent a prescription to your pharmacy

## 2011-10-13 NOTE — Progress Notes (Signed)
This is a 78 year old Caucasian female with chronic GERD who has intermittent solid food dysphagia. Endoscopy one year ago was fairly unremarkable. She currently is taking Nexium 40 mg a day, and denies burning substernal chest pain and regurgitation. She is on multiple medications that are listed and reviewed, also chronically anticoagulated on Coumadin. There is no history of a known esophageal motility disorder. The patient has refused outpatient manometry and 24-hour pH probe testing.  Current Medications, Allergies, Past Medical History, Past Surgical History, Family History and Social History were reviewed in [employer] record.  Pertinent Review of Systems Negative... no current cardiovascular or pulmonary complaints. There is no history of Raynaud's phenomena Marlo symptoms of collagen vascular disease.   Physical Exam: Healthy-appearing patient in no acute distress. Blood pressure 120/60, pulse 80 and regular, and weight 143 pounds the BMI of 27.93. I cannot appreciate stigmata of chronic liver disease or thyromegaly. Examination oral pharyngeal area is unremarkable.    Assessment and Plan: Rule out structural abnormality of her esophagus from large hiatal hernia, stricturing, versus esophageal motility disorder. I've schedule barium swallow and we will proceed accordingly. She may need referral to [facility] for esophageal manometry, and impedance studies. She was previously treated for Candida esophagitis. Endoscopy also showed a large hiatal hernia with Sheria Lang erosions. This patient is a poor candidate for fundoplication surgery because of her cardiovascular issues and age.  Please copy Dr. Alroy Dust Encounter Diagnosis  Name Primary?  Marland Kitchen Dysphagia Yes

## 2011-10-19 ENCOUNTER — Ambulatory Visit (HOSPITAL_COMMUNITY)
Admission: RE | Admit: 2011-10-19 | Discharge: 2011-10-19 | Disposition: A | Discharge status: Home / Self Care | Payer: Medicare Other | Source: Ambulatory Visit | Attending: Gastroenterology | Admitting: Gastroenterology

## 2011-10-19 DIAGNOSIS — R1310 Dysphagia, unspecified: Secondary | ICD-10-CM | POA: Insufficient documentation

## 2011-10-19 DIAGNOSIS — K2289 Other specified disease of esophagus: Secondary | ICD-10-CM | POA: Insufficient documentation

## 2011-10-19 DIAGNOSIS — K449 Diaphragmatic hernia without obstruction or gangrene: Secondary | ICD-10-CM | POA: Insufficient documentation

## 2011-10-19 DIAGNOSIS — K228 Other specified diseases of esophagus: Secondary | ICD-10-CM | POA: Insufficient documentation

## 2011-10-26 ENCOUNTER — Encounter (INDEPENDENT_AMBULATORY_CARE_PROVIDER_SITE_OTHER): Payer: Medicare Other | Admitting: Gastroenterology

## 2011-10-26 ENCOUNTER — Encounter (HOSPITAL_COMMUNITY): Admission: RE | Payer: Self-pay | Source: Ambulatory Visit

## 2011-10-26 ENCOUNTER — Ambulatory Visit (HOSPITAL_COMMUNITY): Admission: RE | Admit: 2011-10-26 | Payer: Medicare Other | Source: Ambulatory Visit | Admitting: Gastroenterology

## 2011-10-26 ENCOUNTER — Ambulatory Visit (HOSPITAL_COMMUNITY)
Admission: RE | Admit: 2011-10-26 | Discharge: 2011-10-26 | Disposition: A | Discharge status: Home / Self Care | Payer: Medicare Other | Source: Ambulatory Visit | Attending: Gastroenterology | Admitting: Gastroenterology

## 2011-10-26 ENCOUNTER — Encounter: Payer: Self-pay | Admitting: Gastroenterology

## 2011-10-26 ENCOUNTER — Encounter (HOSPITAL_COMMUNITY)
Admission: RE | Disposition: A | Discharge status: Home / Self Care | Payer: Self-pay | Source: Ambulatory Visit | Attending: Gastroenterology

## 2011-10-26 DIAGNOSIS — R1310 Dysphagia, unspecified: Secondary | ICD-10-CM | POA: Insufficient documentation

## 2011-10-26 DIAGNOSIS — K219 Gastro-esophageal reflux disease without esophagitis: Secondary | ICD-10-CM

## 2011-10-26 HISTORY — PX: ESOPHAGEAL MANOMETRY: SHX5429

## 2011-10-26 SURGERY — MANOMETRY, ESOPHAGUS

## 2011-10-26 SURGERY — MANOMETRY, ESOPHAGUS
Anesthesia: Choice

## 2011-10-26 MED ORDER — LIDOCAINE VISCOUS 2 % MT SOLN
OROMUCOSAL | Status: AC
  Filled 2011-10-26: qty 15

## 2011-10-27 ENCOUNTER — Encounter (HOSPITAL_COMMUNITY): Payer: Self-pay | Admitting: Gastroenterology

## 2011-10-29 ENCOUNTER — Telehealth: Payer: Self-pay | Admitting: Gastroenterology

## 2011-10-29 NOTE — Telephone Encounter (Signed)
Pt said she had a barium swallow done on Monday and was told results would be in either today or Friday. If you can call her with the results either at home or on her cell [phone]

## 2011-10-30 ENCOUNTER — Telehealth: Payer: Self-pay | Admitting: *Deleted

## 2011-10-30 NOTE — Telephone Encounter (Signed)
Informed pt Dr Jarold Motto is off and he has to interpret the results so it will probably be late next week; pt stated understanding. She reports she received her packet from [facility] but her appt isn't until October, 2013. EM results when you have them. Thanks.

## 2011-10-30 NOTE — Telephone Encounter (Signed)
Message copied by Florene Glen on Fri Oct 30, 2011  9:58 AM ------      Message from: Adonis Housekeeper A      Created: Thu Oct 29, 2011  4:47 PM       Pt said she had a barium swallow done on Monday and was told results would be in either today or Friday. If you can call her with the results either at home or on her cell [phone]

## 2011-11-02 ENCOUNTER — Encounter: Payer: Self-pay | Admitting: Pulmonary Disease

## 2011-11-02 ENCOUNTER — Ambulatory Visit (INDEPENDENT_AMBULATORY_CARE_PROVIDER_SITE_OTHER): Payer: Medicare Other | Admitting: Pulmonary Disease

## 2011-11-02 VITALS — BP 130/62 | HR 91 | Temp 97.1°F | Ht 60.0 in | Wt 136.8 lb

## 2011-11-02 DIAGNOSIS — I10 Essential (primary) hypertension: Secondary | ICD-10-CM

## 2011-11-02 DIAGNOSIS — D649 Anemia, unspecified: Secondary | ICD-10-CM

## 2011-11-02 DIAGNOSIS — E7800 Pure hypercholesterolemia, unspecified: Secondary | ICD-10-CM

## 2011-11-02 DIAGNOSIS — K219 Gastro-esophageal reflux disease without esophagitis: Secondary | ICD-10-CM

## 2011-11-02 DIAGNOSIS — N319 Neuromuscular dysfunction of bladder, unspecified: Secondary | ICD-10-CM

## 2011-11-02 DIAGNOSIS — M899 Disorder of bone, unspecified: Secondary | ICD-10-CM

## 2011-11-02 DIAGNOSIS — M1990 Unspecified osteoarthritis, unspecified site: Secondary | ICD-10-CM

## 2011-11-02 DIAGNOSIS — J45909 Unspecified asthma, uncomplicated: Secondary | ICD-10-CM

## 2011-11-02 DIAGNOSIS — N259 Disorder resulting from impaired renal tubular function, unspecified: Secondary | ICD-10-CM

## 2011-11-02 DIAGNOSIS — K5730 Diverticulosis of large intestine without perforation or abscess without bleeding: Secondary | ICD-10-CM

## 2011-11-02 DIAGNOSIS — K589 Irritable bowel syndrome without diarrhea: Secondary | ICD-10-CM

## 2011-11-02 DIAGNOSIS — F411 Generalized anxiety disorder: Secondary | ICD-10-CM

## 2011-11-02 DIAGNOSIS — M949 Disorder of cartilage, unspecified: Secondary | ICD-10-CM

## 2011-11-02 NOTE — Progress Notes (Signed)
Subjective:    Patient ID: Nancy Hicks, female    DOB: 1926/07/06, 78 y.o.   MRN: [id number]  HPI 78 y/o WF here for a follow up visit... she has multiple medical problems as noted below & is followed both here and in [place] by DrFagin...   ~  May 06, 2010:  Nancy Hicks developed some dysphagia/ indigestion & had GI eval by DrPatterson w/ EGD 1/12 showing HH, mult polyps, erosions, & candida plaques> Rx Diflucan, Nexium daily ==> improved...  she developed an ulcer on her leg & sent to wound clinic in [place], treated w/ antibiotics (?subseq candida esoph?) & she tells me that vascular studies are pending...  she notes some intermittent epistaxis & rec to use Saline nasal mist freq as needed...    Asthma is under good control;  she remains on coumadin w/ protimes done in [place];  BP well controlled on rx;  DrFagin has cut her Simva to 10mg  & f/u FLP looks good;  she has mild renal insuffic, followed by DrKimbrough for urology w/ neurogenic bladder & seen 8/11- all stable;  labs done today w/ copy to DrFagin.  ~  November 04, 2010:  54mo ROV & she reports doing well in the interval- no new complaints or concerns;  Her LMD is DrFagin in [place] & she gets her Protimes monitored thru his office;  She had full labs here 2/12 & all wnl x Hg=11.4.Marland KitchenMarland Kitchen    Asthma well controlled on Advair Bid & Ventolin Prn; denies cough, phlegm, SOB (stable DOE w/o change), swelling;  Hx DVT/ PE and Factor V defic on Coumadin monitored thru DrFagin's office...    She reports BP & lipids well controlled on Cardizem, Cozaar, Lasix & KCl; along w/ Simva10 & Fenofibrate160; BP= 134/76 and feeling well> denies HA, fatigue, visual changes, CP, palipit, dizziness, syncope, edema, etc...    She has a large HH, GERD & stable on her Nexium; she reports interval bouts of dudenitis treated by DrFagin...    See prob list below >>  ~  May 04, 2011:  54mo ROV & she reports a quiet interval, no new complaints or concerns;   She sees DrFagin in [place] for primary care & protimes (on coumadin for hx PE/ DVT & Factor V defic);  We follow her Asthma> stable on Advair100 and VentolinHFA rescue inhaler (she has NEVER needed to use this she says)...    BP controlled on diltiazem, Losartan, Lasix> 140/60 today & she denies CP, palpit, ch in DOE, edema, etc...    Chol controlled on diet + Simva & Feno> FLP today looks good (see below)...    She has a large HH seen on CXRs & hx severe GERD followed by drPatterson but she reports well controlled now on Nexium 40mg /d...    She also had stable mild Renalinsuffic w/ BUN=38, Creat=1.5 & reminded to stay well hydrated; prev hx of neurogenic bladder & used to have to do self caths but this has resolved itself 7 she reports continued normal voiding regimen...  ~  November 02, 2011:  54mo ROV & Nancy Hicks was adm to [facility] 2/13 by [facility] for Diverticulitis confirmed on CT Abd, treated w/ Cipro & Flagyl & improved... She had followed up w/ DrPatterson for her [facility] & intermittent solid food dysphagia ==> see eval below w/ BaSwallow, EsophManometry, ?24H pH probe, & further eval rec at [facility]...  She also c/o DOE w/ some ADLs like housework, etc; we reviewed exercise prescription & she  is too sedentary==> try Cardio-Pulm REHAB...    We reviewed prob list, meds, xrays and labs> see below for updates >>          Problem List:   she had PNEUMOVAX 2004, & gets yearly Flu shots... TDAP given 8/11.  ASTHMA (ICD-493.90) - on ADVAIR 100Bid & VentolinHFA Prn... prev required O2 and Medrol but not in some time now- doing well- stable without cough, sputum, SOB, wheezing, CP, etc... prev on allergy vaccine & she stopped it... she has noted incr dyspnea w/ lack of exercise & improved back on exerc program. ~  NOTE:  large HH seen on CXR, & takes Nexium Tid... ~  CXR 2/11 showed large HH, DJD spine, clear lungs, NAD.Marland Kitchen. ~  CXR 2/13 showed large HH, DJD/ scoliosis of spine, NAD.Marland Kitchen. ~  8/13:  She is too  sedentary & notes some DOE w/ ADLs; rec to enter cardio-pulm rehab program...  PULMONARY EMBOLISM, HX OF (ICD-V12.51) & FACTOR V DEFICIENCY (ICD-286.3) - DVT in right leg and PTE on CTAngio in 4/05 and treated w/ Hep/ COUMADIN (protimes followed in  by DrFagan)... last hosp in 9/06 for R heart cath to r/o pulmHTN & normal pressures were found...  HYPERTENSION (ICD-401.9) - on DILTIAZEM 240mg /d, LOSARTAN 100mg /d, LASIX 40mg - 1/2 tab daily, & K20/d... ~  2DEcho 6/06 showed normal LV wall thicknees & LVF, ? mild pulm HTN w/ RVsys est 30-40 ~  8/12:  BP= 134/76 & she denies CP, palpit, dizzy, SOB, edema... ~  2/13:  BP= 140/60 & she remains asymptomatic...  DEEP VENOUS THROMBOPHLEBITIS, HX OF (ICD-V12.52) - as above, & she wears TED hose, no edema...  HYPERCHOLESTEROLEMIA (ICD-272.0) - on SIMVASTATIN 10mg /d now & FENOFIBRATE 160mg /d... ~  FLP 6/08 (on Lescol & Tricor) showed TChol 165, TG 94, HDL 49, LDL 97... rec- contin same. ~  FLP 7/09 showed TChol 152, TG 123, HDL 45, LDL 83... rec- change to Simva40 + Fenofib160 for $$. ~  FLP 7/10 showed TChol 160, TG 107, HDL 51, LDL 88... continue same. ~  FLP 8/11 (on Simva10+Feno160) showed TChol 154, TG 102, HDL 55, LDL 79... stable, same Rx. ~  FLP 2/12 (on Simva10+Feno160) showed TChol 152, TG 118, HDL 48, LDL 80 ~  FLP 2/13 (on Simva10+Feno160) showed TChol 172, TG 123, HDL 52, LDL 95  GASTROESOPHAGEAL REFLUX DISEASE, SEVERE (ICD-530.81) - she has a large HH seen on routine CXRs; severe GERD followed by DrPatterson; on NEXIUM 40mg - recent decr to 1/d... c/o gas & encouraged to take GasX, Mylicon, BeanO. ~  last EGD 6/06 showed large HH & stricture dilated... ~  GI eval for incr dysphagia 1/12 w/ EGD by DrPatterson showing large prolapsing HH, candida esoph, erosions & polyps> treated w/ Diflucan, Nexium decr to once daily. ~  She reports stable on Nexium 40mg  /d... ~  2013: She's had f/u w/ DrPatterson> chr GERD, intermit solid food  dysphagia; he rec Ba Swallow, Manometry & 24H pH probe, and appt at [facility]... ~  7/13:  Ba Swallow showed web-like narrowing in cerv esoph, presbyesoph, tortuous mid-distal esoph, largeHH, spasm distally, 13mm Ba tablet temporarily lodged in mid-esoph,   DIVERTICULOSIS, COLON (ICD-562.10) - on LEVSIN 0.125mg  tid prn... ~  colonoscopy 10/00 showed divertics, no polyps. ~  GI f/u by [facility] 10/09- note reviewed... on CITRUCEL daily, & rec to take Phazyme for Gas. ~  [facility] 2/13 at [facility] by [facility] for Diverticulitis, treated w/ Cipro/ Flagyl & improved... ~  CT Abd&Pelvis 2/13 showed  largeHH, s/p GB, kidney cysts, acute diverticulitis in prox sigmoid & no complicating features...  RENAL INSUFFICIENCY (ICD-588.9) ~  labs 6/08 showed BUN= 43, Creat= 1.7 ~  labs 7/09 showed BUN= 35, Creat= 1.7 ~  labs 7/10 showed BUN= 51, Creat= 1.8 ~  labs 9/10 showed BUN= 33, Creat= 1.4 ~  labs 2/11 showed BUN= 32, Creat= 2.1, K= 4.6 ~  labs 8/11 showed BUN= 34, Creat= 1.5, K= 4.7 ~  labs 2/12 showed BUN= 37, creat= 1.5, K= 4.9 ~  Labs 2/13 showed BUN= 38, Creat= 1.5, K= 4.3  NEUROGENIC BLADDER (ICD-596.54) - eval and Rx by DrKimbrough in the past... prev had to self-cath regularly, & now voids satis on her own... still on MANDELAMINE for UTI prophylaxis per Urology.  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 also takes ALLOPURINOL 300mg /d for hyperuricemia... ~  labs 7/10 showed Uric= 4.3.Marland Kitchen. rec> continue Allopurinol. ~  labs 8/11 showed Uric= 3.8  OSTEOPENIA (ICD-733.90) - BMD from GYN= DrNeal w/ osteopenia Rx'd w/ ca++, MVI, VitD... ~  7/10: pt reports that DrNeal did VitD level & started her on Vit D 2000 u daily... ~  labs 2/11 showed Vit D level = 54... ~  Labs 2/13 showed Vit D level = 48... Continue supplement.  ANXIETY (ICD-300.00) - on ZOLOFT 100mg - 1/2 tab  Qhs...  ANEMIA (ICD-285.9) - hx of intol to all  P O forms of iron therapy... ~  labs 6/08 showed Hg= 11.1.Marland Kitchen. ~  labs 7/09 showed Hg= 10.3.Marland KitchenMarland Kitchen DrPatterson checked labs 10/09 w/ Fe= 77, Ferritin= 26, B12= 382. ~  labs 7/10 showed Hg= 11.0 ~  labs 2/11 showed Hg= 11.6 ~  labs 8/11 showed Hg= 11.9 ~  labs 2/12 showed Hg= 11.4 ~  Labs 2/13 showed Hg= 12.4, MCV= 96   Past Surgical History  Procedure Date  . Cholecystectomy   . Salivery glad tumor     right  . Tonsillectomy   . Esophageal manometry 10/26/2011    Procedure: ESOPHAGEAL MANOMETRY (EM);  Surgeon: Mardella Layman, MD;  Location: [facility];  Service: Endoscopy;  Laterality: N/A;    Outpatient Encounter Prescriptions as of 11/02/2011  Medication Sig Dispense Refill  . ADVAIR DISKUS 100-50 MCG/DOSE AEPB INHALE 1 DOSE BY MOUTH TWICE A DAY  60 each  2  . albuterol (PROVENTIL HFA;VENTOLIN HFA) 108 (90 BASE) MCG/ACT inhaler Inhale 2 puffs into the lungs every 6 (six) hours as needed. For shortness of breath      . allopurinol (ZYLOPRIM) 300 MG tablet Take 300 mg by mouth daily.        . Biotin (SUPER BIOTIN) 5 MG CAPS Take 1 capsule by mouth daily.        . Calcium Citrate-Vitamin D (CITRACAL MAXIMUM) 315-250 MG-UNIT TABS Take 1 capsule by mouth daily.      . Cholecalciferol (VITAMIN D3) 2000 UNITS TABS Take 1 tablet by mouth daily.        Marland Kitchen diltiazem (CARDIZEM CD) 240 MG 24 hr capsule TAKE 1 CAPSULE ONCE DAILY  90 capsule  2  . esomeprazole (NEXIUM) 40 MG capsule Take one tablet by mouth once a day  30 capsule  11  . fenofibrate 160 MG tablet TAKE 1 TABLET DAILY  90 tablet  4  . fluticasone (FLONASE) 50 MCG/ACT nasal spray       . furosemide (LASIX) 40 MG tablet TAKE ONE-HALF (1/2)  TABLET DAILY  45 tablet  5  . losartan (COZAAR) 100 MG tablet TAKE 1 TABLET DAILY. (BEGIN AFTER FINISHING DIOVAN RX)  90 tablet  2  . methenamine (HIPREX) 1 G tablet Take 500 mg by mouth 2 (two) times daily with a meal.       . methylcellulose  (CITRUCEL) oral powder Take 1 tsp by mouth daily       . Multiple Vitamin (MULITIVITAMIN WITH MINERALS) TABS Take 1 tablet by mouth daily.      Bertram Gala Glycol-Propyl Glycol (SYSTANE ULTRA PF) 0.4-0.3 % SOLN Place 2 drops into both eyes 2 (two) times daily.      . potassium chloride SA (K-DUR,KLOR-CON) 20 MEQ tablet TAKE 1 TABLET ONCE DAILY  90 tablet  2  . sertraline (ZOLOFT) 100 MG tablet Take 50 mg by mouth at bedtime.      . simvastatin (ZOCOR) 10 MG tablet Take 10 mg by mouth at bedtime.        Marland Kitchen warfarin (COUMADIN) 2.5 MG tablet Take 1.25-2.5 mg by mouth as directed. Take 2.5 mg (1 tablet ) on Mondays and Thursdays.  Take 1.25 mg ( 1/2 tablet ) on all other days      . hyoscyamine (LEVSIN SL) 0.125 MG SL tablet Place 1 tablet (0.125 mg total) under the tongue every 4 (four) hours as needed for cramping.  30 tablet  1    Allergies  Allergen Reactions  . Metoclopramide Hcl Other (See Comments)    REACTION: causes uncontrolable leg movements  . Nitrofurantoin Nausea And Vomiting and Other (See Comments)    REACTION: dizziness    Current Medications, Allergies, Past Medical History, Past Surgical History, Family History, and Social History were reviewed in [employer] record.    Review of Systems    See HPI - all other systems neg except as noted...  The patient complains of dyspnea on exertion.  The patient denies anorexia, fever, weight loss, weight gain, vision loss, decreased hearing, hoarseness, chest pain, syncope, peripheral edema, prolonged cough, headaches, hemoptysis, abdominal pain, melena, hematochezia, severe indigestion/heartburn, hematuria, incontinence, muscle weakness, suspicious skin lesions, transient blindness, difficulty walking, depression, unusual weight change, abnormal bleeding, enlarged lymph nodes, and angioedema.     Objective:   Physical Exam     WD, WN, 78 y/o WF in NAD GENERAL:  Alert & oriented; pleasant &  cooperative... HEENT:  [place]/AT, EOM-wnl, PERRLA, EACs-clear, TMs-wnl, NOSE-clear, THROAT-clear & wnl. NECK:  Supple w/ decrROM; no JVD; normal carotid impulses w/o bruits; no thyromegaly or nodules palpated; no lymphadenopathy. CHEST:  Clear to P & A; without wheezes/ rales/ or rhonchi. HEART:  regular rhythm; without murmurs/ rubs/ or gallops. ABDOMEN:  soft & nontender; normal bowel sounds; no organomegaly or masses detected. EXT: without deformities, mild arthritic changes; no varicose veins/ venous insuffic/ or edema. NEURO:  CN's intact;  no focal neuro deficits... DERM:  No lesions noted; no rash etc...  RADIOLOGY DATA:  Reviewed in the EPIC EMR & discussed w/ the patient...  LABORATORY DATA:  Reviewed in the EPIC EMR & discussed w/ the patient...   Assessment & Plan:   ASTHMA>  Stable on Advair, Ventolin; but she is too sedentary 7 needs incr activity- rec cardio-pulm REHAB...  Hx DVT/ PTE/ Factor V defic>  Stable on Coumadin w/ protimes per DrFagan in [place]; continue same...  HBP>  Controlled on Cardizem, Losartan, Lasix; continue same...  CHOL>  Stable on Simva + Fenofib and diet;  Continue same.Marland KitchenMarland Kitchen  LargeHH/ GERD>  On Nexium & followed by DrPatterson; continue same meds & vigorous antireflux program; he is in the process of further eval & referral to [facility]...  Divertics>  She had bout of acute diverticulitis 2/13 treated at [facility]; symptoms resolved & now on maintenance program...  Renal Insuffic, Hx of Neurogenic Bladder>  Stable on current meds & voiding satis w/o need to self cath, she still takes Mandelamine for UTI prevention...  DJD, Hyperuricemia, DDD, Osteopenia>  She remains on Allopurinol, calcium, MVI, Vit D...  Anemia>  Hg remains stable at 11-12 range...  Anxiety/ Depression>  On ZOLOFT 100mg  taking 1/2 Qhs...   Patient's Medications  New Prescriptions   No medications on file  Previous Medications   ADVAIR DISKUS 100-50 MCG/DOSE AEPB     INHALE 1 DOSE BY MOUTH TWICE A DAY   ALBUTEROL (PROVENTIL HFA;VENTOLIN HFA) 108 (90 BASE) MCG/ACT INHALER    Inhale 2 puffs into the lungs every 6 (six) hours as needed. For shortness of breath   ALLOPURINOL (ZYLOPRIM) 300 MG TABLET    Take 300 mg by mouth daily.     BIOTIN (SUPER BIOTIN) 5 MG CAPS    Take 1 capsule by mouth daily.     CALCIUM CITRATE-VITAMIN D (CITRACAL MAXIMUM) 315-250 MG-UNIT TABS    Take 1 capsule by mouth daily.   CHOLECALCIFEROL (VITAMIN D3) 2000 UNITS TABS    Take 1 tablet by mouth daily.     DILTIAZEM (CARDIZEM CD) 240 MG 24 HR CAPSULE    TAKE 1 CAPSULE ONCE DAILY   ESOMEPRAZOLE (NEXIUM) 40 MG CAPSULE    Take one tablet by mouth once a day   FENOFIBRATE 160 MG TABLET    TAKE 1 TABLET DAILY   FLUTICASONE (FLONASE) 50 MCG/ACT NASAL SPRAY       FUROSEMIDE (LASIX) 40 MG TABLET    TAKE ONE-HALF (1/2) TABLET DAILY   HYOSCYAMINE (LEVSIN SL) 0.125 MG SL TABLET    Place 1 tablet (0.125 mg total) under the tongue every 4 (four) hours as needed for cramping.   LOSARTAN (COZAAR) 100 MG TABLET    TAKE 1 TABLET DAILY. (BEGIN AFTER FINISHING DIOVAN RX)   METHENAMINE (HIPREX) 1 G TABLET    Take 500 mg by mouth 2 (two) times daily with a meal.    METHYLCELLULOSE (CITRUCEL) ORAL POWDER    Take 1 tsp by mouth daily    MULTIPLE VITAMIN (MULITIVITAMIN WITH MINERALS) TABS    Take 1 tablet by mouth daily.   POLYETHYL GLYCOL-PROPYL GLYCOL (SYSTANE ULTRA PF) 0.4-0.3 % SOLN    Place 2 drops into both eyes 2 (two) times daily.   POTASSIUM CHLORIDE SA (K-DUR,KLOR-CON) 20 MEQ TABLET    TAKE 1 TABLET ONCE DAILY   SERTRALINE (ZOLOFT) 100 MG TABLET    Take 50 mg by mouth at bedtime.   SIMVASTATIN (ZOCOR) 10 MG TABLET    Take 10 mg by mouth at bedtime.     WARFARIN (COUMADIN) 2.5 MG TABLET    Take 1.25-2.5 mg by mouth as directed. Take 2.5 mg (1 tablet ) on Mondays and Thursdays.  Take 1.25 mg ( 1/2 tablet ) on all other days  Modified Medications   No medications on file  Discontinued Medications    No medications on file

## 2011-11-02 NOTE — Patient Instructions (Addendum)
Today we updated your med list in our EPIC system...    Continue your current medications the same...  We reviewed your prev lab work 7 your recent GI evaluation...    Continue your follow up w/ DrPatterson...  We will inquire about a Cardio-pulmonary rehab program at [facility]...    In the meanwhile- increase your at home exercise program to improve your stamina...  Call for any questions...  Let's plan another 23mo follow up visit w/ FASTING blood work at that time.Nancy KitchenMarland Hicks

## 2011-11-02 NOTE — Addendum Note (Signed)
Addended by: Marcellus Scott on: 11/02/2011 02:13 PM   Modules accepted: Orders

## 2011-11-04 ENCOUNTER — Other Ambulatory Visit: Payer: Self-pay | Admitting: Pulmonary Disease

## 2011-11-06 ENCOUNTER — Telehealth: Payer: Self-pay | Admitting: Gastroenterology

## 2011-11-06 NOTE — Telephone Encounter (Signed)
Informed pt that I was wrong and we have not got the results back for Dr Jarold Motto to review. I will call her; pt stated understanding.

## 2011-11-11 ENCOUNTER — Telehealth: Payer: Self-pay | Admitting: Gastroenterology

## 2011-11-11 NOTE — Telephone Encounter (Signed)
See 11/11/11 encounter

## 2011-11-12 ENCOUNTER — Telehealth: Payer: Self-pay | Admitting: Gastroenterology

## 2011-11-12 NOTE — Telephone Encounter (Signed)
Dr Jarold Motto, can you give EM results; I think Judeth Cornfield brought them to you? Thanks.

## 2011-11-12 NOTE — Telephone Encounter (Signed)
See encounter from 11/12/11.

## 2011-11-13 NOTE — Progress Notes (Signed)
Esophageal manometry was completed on 10/26/2011. Results are as follows:  #1. Upper esophageal sphincter-there appears to be good coordination between pharyngeal contraction and cricopharyngeal relaxation.  #2. Lower esophageal sphincter-mean pressure is 30 mm of mercury with normal relaxation to swallowing.  #3. Motility pattern-they're normally propagated peristaltic waves throughout the length of the esophagus to both wet and dry swallows. Mean amplitude of contraction is 60 mmHg. There is no evidence of esophageal dysmotility.  Assessment this is normal esophageal manometry without any changes here to explain the patient's symptomatology.

## 2011-11-13 NOTE — Telephone Encounter (Signed)
i don't have

## 2011-11-13 NOTE — Telephone Encounter (Signed)
Dr Jarold Motto reviewed the latest EM and it was normal; please keep her appt at [facility]. Pt stated understanding. She reports her indigestion is better since she started taking the Nexium at night.

## 2011-11-16 ENCOUNTER — Telehealth: Payer: Self-pay | Admitting: Gastroenterology

## 2011-11-16 NOTE — Telephone Encounter (Signed)
Pt called to ask if she still needs to go to [facility] since her EM was OK. Explained she should come talk this over with Dr Jarold Motto. Read her the Barium Swallow report and how bad her esophagus appeared. She agreed and will come in on 12/01/11 to discuss the situation with Dr Jarold Motto.

## 2011-11-16 NOTE — Telephone Encounter (Signed)
lmom for pt to call back

## 2011-11-18 ENCOUNTER — Encounter: Payer: Self-pay | Admitting: Gastroenterology

## 2011-12-01 ENCOUNTER — Ambulatory Visit (INDEPENDENT_AMBULATORY_CARE_PROVIDER_SITE_OTHER): Payer: Medicare Other | Admitting: Gastroenterology

## 2011-12-01 ENCOUNTER — Encounter: Payer: Self-pay | Admitting: Gastroenterology

## 2011-12-01 VITALS — BP 128/60 | HR 68 | Ht 60.0 in | Wt 141.0 lb

## 2011-12-01 DIAGNOSIS — R1314 Dysphagia, pharyngoesophageal phase: Secondary | ICD-10-CM

## 2011-12-01 DIAGNOSIS — R1310 Dysphagia, unspecified: Secondary | ICD-10-CM

## 2011-12-01 NOTE — Progress Notes (Signed)
  Esophageal manometry done on 10/26/2011. Results are as follows:  #1 upper esophageal sphincter-there is normal coordination between pharyngeal contraction and cricopharyngeal relaxation.  #2 lower esophageal sphincter-mean pressure 25 mm of mercury with normal relaxation to swallowing.  #3 motility pattern-they're normally propagated peristaltic waves throughout the length of the esophagus both wet and dry swallows. There is no evidence of esophageal dysmotility.  Assessment this is normal esophageal manometry without any evidence of achalasia or other motility disorder to account for her recurrent dysphagia.  Vania Rea. Jarold Motto M.D. dictated 12/01/2011

## 2011-12-01 NOTE — Progress Notes (Signed)
History of Present Illness: This is a complicated 78 year old Caucasian female with recurrent dysphagia of unexplained etiology. She's had several endoscopic exams over the years with dilations which have been only partially helpful. Last exam was approximately year and a half ago. Recent barium swallow suggested presbyesophagus, and esophageal manometry was performed which was entirely normal on August 5 with an LES pressure of approximately 25 mm of mercury and normal relaxation to swallowing. Esophageal waves appear to be peristaltic with a mean amplitude of contraction of 60 mm of mercury. The patient is been on chronic Nexium 40 mg a day with when necessary sublingual Levsin. She continues with intermittent chest pain and dysphagia, and is chronically anticoagulated and normal tubal medications.    Current Medications, Allergies, Past Medical History, Past Surgical History, Family History and Social History were reviewed in [employer] record.   Assessment and plan: I have decided to refer this patient to [facility] Division of Gastroenterology for review of her dysphagia problems. She has a known hiatal hernia and a very tortuous and spastic esophagus, and I suspect she will need followup endoscopic exam. I would appreciate their input as to her care and management. Also review her records at one point she was treated for Candida esophagitis without any significant change in her symptoms. We will send her records and radiographic and endoscopic and manometry reports for review. No diagnosis found.

## 2011-12-06 ENCOUNTER — Other Ambulatory Visit: Payer: Self-pay | Admitting: Pulmonary Disease

## 2011-12-08 ENCOUNTER — Encounter (HOSPITAL_COMMUNITY)
Admission: RE | Admit: 2011-12-08 | Discharge: 2011-12-08 | Disposition: A | Discharge status: Home / Self Care | Payer: Medicare Other | Source: Ambulatory Visit | Attending: Pulmonary Disease | Admitting: Pulmonary Disease

## 2011-12-08 ENCOUNTER — Encounter (HOSPITAL_COMMUNITY): Payer: Self-pay

## 2011-12-08 VITALS — BP 162/62 | HR 65 | Ht 60.0 in | Wt 141.5 lb

## 2011-12-08 DIAGNOSIS — J45909 Unspecified asthma, uncomplicated: Secondary | ICD-10-CM | POA: Insufficient documentation

## 2011-12-08 DIAGNOSIS — I10 Essential (primary) hypertension: Secondary | ICD-10-CM | POA: Insufficient documentation

## 2011-12-08 NOTE — Progress Notes (Signed)
Patient referred to Pulmonary Rehab by Dr. Alroy Dust due to Asthma 493/90. During orientation advised patient on arrival and appointment times what to wear, what to do before, during and after exercise. Reviewed attendance and class policy. Talked about inclement weather and class consultation policy. Pt is scheduled to start Pulmonary Rehab on 12/14/11 at 1pm. Pt was advised to come to class 5 minutes before class starts. He was also given instructions on meeting with the dietician and attending the Family Structure classes. Pt is eager to get started.

## 2011-12-08 NOTE — Patient Instructions (Signed)
Pt has finished orientation and is scheduled to start Pulmonary Rehab on 12/14/11 at 1 pm. Pt has been instructed to arrive to class 15 minutes early for scheduled class. Pt has been instructed to wear comfortable clothing and shoes with rubber soles. Pt has been told to take their medications 1 hour prior to coming to class.  If the patient is not going to attend class, he/she has been instructed to call.

## 2011-12-14 ENCOUNTER — Encounter (HOSPITAL_COMMUNITY)
Admission: RE | Admit: 2011-12-14 | Discharge: 2011-12-14 | Disposition: A | Discharge status: Home / Self Care | Payer: Medicare Other | Source: Ambulatory Visit | Attending: Pulmonary Disease | Admitting: Pulmonary Disease

## 2011-12-16 ENCOUNTER — Encounter (HOSPITAL_COMMUNITY)
Admission: RE | Admit: 2011-12-16 | Discharge: 2011-12-16 | Disposition: A | Discharge status: Home / Self Care | Payer: Medicare Other | Source: Ambulatory Visit | Attending: Pulmonary Disease | Admitting: Pulmonary Disease

## 2011-12-21 ENCOUNTER — Encounter (HOSPITAL_COMMUNITY)
Admission: RE | Admit: 2011-12-21 | Discharge: 2011-12-21 | Disposition: A | Discharge status: Home / Self Care | Payer: Medicare Other | Source: Ambulatory Visit | Attending: Pulmonary Disease | Admitting: Pulmonary Disease

## 2011-12-23 ENCOUNTER — Encounter (HOSPITAL_COMMUNITY)
Admission: RE | Admit: 2011-12-23 | Discharge: 2011-12-23 | Disposition: A | Discharge status: Home / Self Care | Payer: Medicare Other | Source: Ambulatory Visit | Attending: Pulmonary Disease | Admitting: Pulmonary Disease

## 2011-12-28 ENCOUNTER — Encounter (HOSPITAL_COMMUNITY)
Admission: RE | Admit: 2011-12-28 | Discharge: 2011-12-28 | Disposition: A | Discharge status: Home / Self Care | Payer: Medicare Other | Source: Ambulatory Visit | Attending: Pulmonary Disease | Admitting: Pulmonary Disease

## 2011-12-30 ENCOUNTER — Encounter (HOSPITAL_COMMUNITY)
Admission: RE | Admit: 2011-12-30 | Discharge: 2011-12-30 | Disposition: A | Discharge status: Home / Self Care | Payer: Medicare Other | Source: Ambulatory Visit | Attending: Pulmonary Disease | Admitting: Pulmonary Disease

## 2011-12-30 DIAGNOSIS — I10 Essential (primary) hypertension: Secondary | ICD-10-CM | POA: Insufficient documentation

## 2011-12-30 DIAGNOSIS — J45909 Unspecified asthma, uncomplicated: Secondary | ICD-10-CM | POA: Insufficient documentation

## 2012-01-04 ENCOUNTER — Encounter (HOSPITAL_COMMUNITY)
Admission: RE | Admit: 2012-01-04 | Discharge: 2012-01-04 | Disposition: A | Discharge status: Home / Self Care | Payer: Medicare Other | Source: Ambulatory Visit | Attending: Pulmonary Disease | Admitting: Pulmonary Disease

## 2012-01-06 ENCOUNTER — Encounter (HOSPITAL_COMMUNITY)
Admission: RE | Admit: 2012-01-06 | Discharge: 2012-01-06 | Disposition: A | Discharge status: Home / Self Care | Payer: Medicare Other | Source: Ambulatory Visit | Attending: Pulmonary Disease | Admitting: Pulmonary Disease

## 2012-01-06 ENCOUNTER — Telehealth: Payer: Self-pay | Admitting: Pulmonary Disease

## 2012-01-06 MED ORDER — ALBUTEROL SULFATE HFA 108 (90 BASE) MCG/ACT IN AERS: 2.0000 | INHALATION_SPRAY | Freq: Four times a day (QID) | RESPIRATORY_TRACT | Status: DC | PRN

## 2012-01-06 NOTE — Telephone Encounter (Signed)
Ventolin on her med list and so sent refill on this to her pharm [facility] for the pt to be made aware that this has been done

## 2012-01-11 ENCOUNTER — Encounter (HOSPITAL_COMMUNITY)
Admission: RE | Admit: 2012-01-11 | Discharge: 2012-01-11 | Disposition: A | Discharge status: Home / Self Care | Payer: Medicare Other | Source: Ambulatory Visit | Attending: Pulmonary Disease | Admitting: Pulmonary Disease

## 2012-01-13 ENCOUNTER — Encounter (HOSPITAL_COMMUNITY)
Admission: RE | Admit: 2012-01-13 | Discharge: 2012-01-13 | Disposition: A | Discharge status: Home / Self Care | Payer: Medicare Other | Source: Ambulatory Visit | Attending: Pulmonary Disease | Admitting: Pulmonary Disease

## 2012-01-18 ENCOUNTER — Encounter (HOSPITAL_COMMUNITY)
Admission: RE | Admit: 2012-01-18 | Discharge: 2012-01-18 | Disposition: A | Discharge status: Home / Self Care | Payer: Medicare Other | Source: Ambulatory Visit | Attending: Pulmonary Disease | Admitting: Pulmonary Disease

## 2012-01-20 ENCOUNTER — Encounter (HOSPITAL_COMMUNITY)
Admission: RE | Admit: 2012-01-20 | Discharge: 2012-01-20 | Disposition: A | Discharge status: Home / Self Care | Payer: Medicare Other | Source: Ambulatory Visit | Attending: Pulmonary Disease | Admitting: Pulmonary Disease

## 2012-01-25 ENCOUNTER — Encounter (HOSPITAL_COMMUNITY)
Admission: RE | Admit: 2012-01-25 | Discharge: 2012-01-25 | Disposition: A | Discharge status: Home / Self Care | Payer: Medicare Other | Source: Ambulatory Visit | Attending: Pulmonary Disease | Admitting: Pulmonary Disease

## 2012-01-27 ENCOUNTER — Encounter (HOSPITAL_COMMUNITY)
Admission: RE | Admit: 2012-01-27 | Discharge: 2012-01-27 | Disposition: A | Discharge status: Home / Self Care | Payer: Medicare Other | Source: Ambulatory Visit | Attending: Pulmonary Disease | Admitting: Pulmonary Disease

## 2012-02-01 ENCOUNTER — Encounter (HOSPITAL_COMMUNITY)
Admission: RE | Admit: 2012-02-01 | Discharge: 2012-02-01 | Disposition: A | Discharge status: Home / Self Care | Payer: Medicare Other | Source: Ambulatory Visit | Attending: Pulmonary Disease | Admitting: Pulmonary Disease

## 2012-02-01 DIAGNOSIS — I10 Essential (primary) hypertension: Secondary | ICD-10-CM | POA: Insufficient documentation

## 2012-02-01 DIAGNOSIS — J45909 Unspecified asthma, uncomplicated: Secondary | ICD-10-CM | POA: Insufficient documentation

## 2012-02-03 ENCOUNTER — Encounter (HOSPITAL_COMMUNITY)
Admission: RE | Admit: 2012-02-03 | Discharge: 2012-02-03 | Disposition: A | Discharge status: Home / Self Care | Payer: Medicare Other | Source: Ambulatory Visit | Attending: Pulmonary Disease | Admitting: Pulmonary Disease

## 2012-02-08 ENCOUNTER — Encounter (HOSPITAL_COMMUNITY)
Admission: RE | Admit: 2012-02-08 | Discharge: 2012-02-08 | Disposition: A | Discharge status: Home / Self Care | Payer: Medicare Other | Source: Ambulatory Visit | Attending: Pulmonary Disease | Admitting: Pulmonary Disease

## 2012-02-10 ENCOUNTER — Encounter (HOSPITAL_COMMUNITY)
Admission: RE | Admit: 2012-02-10 | Discharge: 2012-02-10 | Disposition: A | Discharge status: Home / Self Care | Payer: Medicare Other | Source: Ambulatory Visit | Attending: Pulmonary Disease | Admitting: Pulmonary Disease

## 2012-02-15 ENCOUNTER — Encounter (HOSPITAL_COMMUNITY)
Admission: RE | Admit: 2012-02-15 | Discharge: 2012-02-15 | Disposition: A | Discharge status: Home / Self Care | Payer: Medicare Other | Source: Ambulatory Visit | Attending: Pulmonary Disease | Admitting: Pulmonary Disease

## 2012-02-17 ENCOUNTER — Encounter (HOSPITAL_COMMUNITY)
Admission: RE | Admit: 2012-02-17 | Discharge: 2012-02-17 | Disposition: A | Discharge status: Home / Self Care | Payer: Medicare Other | Source: Ambulatory Visit | Attending: Pulmonary Disease | Admitting: Pulmonary Disease

## 2012-02-22 ENCOUNTER — Encounter (HOSPITAL_COMMUNITY)
Admission: RE | Admit: 2012-02-22 | Discharge: 2012-02-22 | Disposition: A | Discharge status: Home / Self Care | Payer: Medicare Other | Source: Ambulatory Visit | Attending: Pulmonary Disease | Admitting: Pulmonary Disease

## 2012-02-24 ENCOUNTER — Encounter (HOSPITAL_COMMUNITY)
Admission: RE | Admit: 2012-02-24 | Discharge: 2012-02-24 | Disposition: A | Discharge status: Home / Self Care | Payer: Medicare Other | Source: Ambulatory Visit | Attending: Pulmonary Disease | Admitting: Pulmonary Disease

## 2012-02-29 ENCOUNTER — Encounter (HOSPITAL_COMMUNITY)
Admission: RE | Admit: 2012-02-29 | Discharge: 2012-02-29 | Disposition: A | Discharge status: Home / Self Care | Payer: Medicare Other | Source: Ambulatory Visit | Attending: Pulmonary Disease | Admitting: Pulmonary Disease

## 2012-02-29 DIAGNOSIS — J45909 Unspecified asthma, uncomplicated: Secondary | ICD-10-CM | POA: Insufficient documentation

## 2012-02-29 DIAGNOSIS — I10 Essential (primary) hypertension: Secondary | ICD-10-CM | POA: Insufficient documentation

## 2012-03-01 ENCOUNTER — Other Ambulatory Visit: Payer: Self-pay | Admitting: Pulmonary Disease

## 2012-03-02 ENCOUNTER — Encounter (HOSPITAL_COMMUNITY)
Admission: RE | Admit: 2012-03-02 | Discharge: 2012-03-02 | Disposition: A | Discharge status: Home / Self Care | Payer: Medicare Other | Source: Ambulatory Visit | Attending: Pulmonary Disease | Admitting: Pulmonary Disease

## 2012-03-24 ENCOUNTER — Telehealth: Payer: Self-pay | Admitting: Pulmonary Disease

## 2012-03-24 MED ORDER — FUROSEMIDE 40 MG PO TABS: 20.0000 mg | ORAL_TABLET | Freq: Every day | ORAL | Status: DC

## 2012-03-24 NOTE — Telephone Encounter (Signed)
Rx has been sent in, pt aware. 

## 2012-03-31 ENCOUNTER — Telehealth: Payer: Self-pay | Admitting: Pulmonary Disease

## 2012-03-31 MED ORDER — LOSARTAN POTASSIUM 100 MG PO TABS: 50.0000 mg | ORAL_TABLET | Freq: Every day | ORAL | Status: DC

## 2012-03-31 MED ORDER — POTASSIUM CHLORIDE CRYS ER 20 MEQ PO TBCR: 20.0000 meq | EXTENDED_RELEASE_TABLET | Freq: Every day | ORAL | Status: DC

## 2012-03-31 MED ORDER — FENOFIBRATE 160 MG PO TABS: 160.0000 mg | ORAL_TABLET | Freq: Every day | ORAL | Status: DC

## 2012-03-31 MED ORDER — FUROSEMIDE 40 MG PO TABS: 20.0000 mg | ORAL_TABLET | Freq: Every day | ORAL | Status: DC

## 2012-03-31 MED ORDER — DILTIAZEM HCL ER COATED BEADS 240 MG PO CP24: 240.0000 mg | ORAL_CAPSULE | Freq: Every day | ORAL | Status: DC

## 2012-03-31 NOTE — Telephone Encounter (Signed)
Rxs were sent to [facility] with pt and notified that this was done She verbalized understanding and states nothing further needed

## 2012-04-01 NOTE — Progress Notes (Signed)
Pulmonary Rehabilitation Program Outcomes Report   Orientation:  12/08/2011 1st week report: 12/21/2011 Graduate Date:  tbd Discharge Date:  tbd # of sessions completed: 3 DX: COPD  Pulmonologist: Kriste Basque Family MD:  Alvia Grove Time:  13:00  A.  Exercise Program:  Tolerates exercise @ 3.41 METS for 15 minutes and Walk Test Results:  Pre: Walked 6 minutes, Rest HR 84, BP 110/60, O2 97%, RPE 6, And RPD 6, ^minutes HR 93, BP 130/72 O2 98%, RPE 13 and RPD 13 , Post HR 83, BP 128/68 O2 97%, RPE 9 and RPD 9, Walked 0.25 miles.  B.  Mental Health:  Good mental attitude  C.  Education/Instruction/Skills  Accurately checks own pulse.  Rest:  88  Exercise:  99, Knows THR for exercise and Uses Perceived Exertion Scale and/or Dyspnea Scale  Uses Perceived Exertion Scale and/or Dyspnea Scale  D.  Nutrition/Weight Control/Body Composition:  Adherence to prescribed nutrition program: good    E.  Blood Lipids    Lab Results  Component Value Date   CHOL 172 05/04/2011   HDL 52.30 05/04/2011   LDLCALC 95 05/04/2011   TRIG 123.0 05/04/2011   CHOLHDL 3 05/04/2011    F.  Lifestyle Changes:  Making positive lifestyle changes  G.  Symptoms noted with exercise:  Asymptomatic  Report Completed By:  Lelon Huh. Belky Mundo RN   Comments:  This is patients 1st week report. She achieved a peak METS of 3.41. Her resting HR is 88 and BP is 130/80 and her peak HR is 99 and peak BP is 140/60. A report will follow upon her 12 th visit her halfway point.

## 2012-04-01 NOTE — Progress Notes (Signed)
Pulmonary Rehabilitation Program Outcomes Report   Orientation:  12/08/2011 Halfway report: 10/23 Graduate Date:  tbd Discharge Date:  tbd # of sessions completed: 12 DX CIOPD  Pulmonologist: Fara Olden MD:  Dr. Alvia Grove Time:  13:00  A.  Exercise Program:  Tolerates exercise @ 3.99 METS for 15 minutes  B.  Mental Health:  Good mental attitude  C.  Education/Instruction/Skills  Accurately checks own pulse.  Rest:  94  Exercise:  110, Knows THR for exercise and Uses Perceived Exertion Scale and/or Dyspnea Scale  Uses Perceived Exertion Scale and/or Dyspnea Scale  D.  Nutrition/Weight Control/Body Composition:  Adherence to prescribed nutrition program: good    E.  Blood Lipids    Lab Results  Component Value Date   CHOL 172 05/04/2011   HDL 52.30 05/04/2011   LDLCALC 95 05/04/2011   TRIG 123.0 05/04/2011   CHOLHDL 3 05/04/2011    F.  Lifestyle Changes:  Making positive lifestyle changes  G.  Symptoms noted with exercise:  Asymptomatic  Report Completed By:  Angelica Pou RN   Comments:  This is patients halway report. She achieved a peak METS of 3.99. Her resting HR was 94 and resting BP was 140/70. Her peak Hr was 110 and peak BP was 150/62. A graduation report will follow on her 24th visit.

## 2012-05-09 ENCOUNTER — Encounter: Payer: Self-pay | Admitting: Pulmonary Disease

## 2012-05-09 ENCOUNTER — Other Ambulatory Visit (INDEPENDENT_AMBULATORY_CARE_PROVIDER_SITE_OTHER): Payer: Medicare Other

## 2012-05-09 ENCOUNTER — Ambulatory Visit (INDEPENDENT_AMBULATORY_CARE_PROVIDER_SITE_OTHER): Payer: Medicare Other | Admitting: Pulmonary Disease

## 2012-05-09 VITALS — BP 130/70 | HR 91 | Temp 98.4°F | Ht 60.0 in | Wt 143.0 lb

## 2012-05-09 DIAGNOSIS — K589 Irritable bowel syndrome without diarrhea: Secondary | ICD-10-CM

## 2012-05-09 DIAGNOSIS — F411 Generalized anxiety disorder: Secondary | ICD-10-CM

## 2012-05-09 DIAGNOSIS — I10 Essential (primary) hypertension: Secondary | ICD-10-CM

## 2012-05-09 DIAGNOSIS — Z8672 Personal history of thrombophlebitis: Secondary | ICD-10-CM

## 2012-05-09 DIAGNOSIS — D649 Anemia, unspecified: Secondary | ICD-10-CM

## 2012-05-09 DIAGNOSIS — E7800 Pure hypercholesterolemia, unspecified: Secondary | ICD-10-CM

## 2012-05-09 DIAGNOSIS — Z86718 Personal history of other venous thrombosis and embolism: Secondary | ICD-10-CM

## 2012-05-09 DIAGNOSIS — J45909 Unspecified asthma, uncomplicated: Secondary | ICD-10-CM

## 2012-05-09 DIAGNOSIS — M1990 Unspecified osteoarthritis, unspecified site: Secondary | ICD-10-CM

## 2012-05-09 DIAGNOSIS — N259 Disorder resulting from impaired renal tubular function, unspecified: Secondary | ICD-10-CM

## 2012-05-09 DIAGNOSIS — K5730 Diverticulosis of large intestine without perforation or abscess without bleeding: Secondary | ICD-10-CM

## 2012-05-09 DIAGNOSIS — N319 Neuromuscular dysfunction of bladder, unspecified: Secondary | ICD-10-CM

## 2012-05-09 DIAGNOSIS — K219 Gastro-esophageal reflux disease without esophagitis: Secondary | ICD-10-CM

## 2012-05-09 DIAGNOSIS — M949 Disorder of cartilage, unspecified: Secondary | ICD-10-CM

## 2012-05-09 LAB — BASIC METABOLIC PANEL
CO2: 27 mEq/L (ref 19–32)
Chloride: 107 mEq/L (ref 96–112)
GFR: 38.38 mL/min — ABNORMAL LOW (ref >60.00)
Glucose, Bld: 98 mg/dL (ref 70–99)
Potassium: 4.4 mEq/L (ref 3.5–5.1)
Sodium: 142 mEq/L (ref 135–145)

## 2012-05-09 LAB — CBC WITH DIFFERENTIAL/PLATELET
Basophils Absolute: 0 10*3/uL (ref 0.0–0.1)
HCT: 34.9 % — ABNORMAL LOW (ref 36.0–46.0)
Lymphs Abs: 1.5 10*3/uL (ref 0.7–4.0)
Monocytes Relative: 9.4 % (ref 3.0–12.0)
Platelets: 225 10*3/uL (ref 150.0–400.0)
RDW: 14.6 % (ref 11.5–14.6)

## 2012-05-09 LAB — HEPATIC FUNCTION PANEL
AST: 24 U/L (ref 0–37)
Albumin: 3.5 g/dL (ref 3.5–5.2)
Alkaline Phosphatase: 48 U/L (ref 39–117)
Total Protein: 6.7 g/dL (ref 6.0–8.3)

## 2012-05-09 LAB — TSH: TSH: 1.56 u[IU]/mL (ref 0.35–5.50)

## 2012-05-09 MED ORDER — SERTRALINE HCL 100 MG PO TABS: ORAL_TABLET | ORAL | Status: DC

## 2012-05-09 NOTE — Patient Instructions (Addendum)
Today we updated your med list in our EPIC system...    Continue your current medications the same...  Today we did your follow up FASTING blood work...    We will contact you w/ the results when avail...  Call for any questions...  Let's plan a similar follow up visit in 6 months or call sooner if needed.Marland KitchenMarland Kitchen

## 2012-05-15 ENCOUNTER — Encounter: Payer: Self-pay | Admitting: Pulmonary Disease

## 2012-05-15 NOTE — Progress Notes (Signed)
Subjective:    Patient ID: [name], female    DOB: [birth year], 79 y.o.   MRN: [id number]  HPI 79 y/o WF here for a follow up visit... she has multiple medical problems as noted below & is followed both here and in [place] by DrFagin...   ~  November 04, 2010:  55mo ROV & she reports doing well in the interval- no new complaints or concerns;  Her LMD is DrFagin in [place] & she gets her Protimes monitored thru his office;  She had full labs here 2/12 & all wnl x Hg=11.4.Marland KitchenMarland Kitchen    Asthma well controlled on Advair Bid & Ventolin Prn; denies cough, phlegm, SOB (stable DOE w/o change), swelling;  Hx DVT/ PE and Factor V defic on Coumadin monitored thru DrFagin's office...    She reports BP & lipids well controlled on Cardizem, Cozaar, Lasix & KCl; along w/ Simva10 & Fenofibrate160; BP= 134/76 and feeling well> denies HA, fatigue, visual changes, CP, palipit, dizziness, syncope, edema, etc...    She has a large HH, GERD & stable on her Nexium; she reports interval bouts of dudenitis treated by DrFagin...    See prob list below >>  ~  May 04, 2011:  55mo ROV & she reports a quiet interval, no new complaints or concerns;  She sees DrFagin in [place] for primary care & protimes (on coumadin for hx PE/ DVT & Factor V defic);  We follow her Asthma> stable on Advair100 and VentolinHFA rescue inhaler (she has NEVER needed to use this she says)...    BP controlled on diltiazem, Losartan, Lasix> 140/60 today & she denies CP, palpit, ch in DOE, edema, etc...    Chol controlled on diet + Simva & Feno> FLP today looks good (see below)...    She has a large HH seen on CXRs & hx severe GERD followed by drPatterson but she reports well controlled now on Nexium 40mg /d...    She also had stable mild Renalinsuffic w/ BUN=38, Creat=1.5 & reminded to stay well hydrated; prev hx of neurogenic bladder & used to have to do self caths but this has resolved itself 7 she reports continued normal voiding  regimen...  ~  November 02, 2011:  55mo ROV & Nancy Hicks was adm to [facility] 2/13 by [facility] for Diverticulitis confirmed on CT Abd, treated w/ Cipro & Flagyl & improved... She had followed up w/ DrPatterson for her [facility] & intermittent solid food dysphagia ==> see eval below w/ BaSwallow, EsophManometry, ?24H pH probe, & further eval rec at [facility]...  She also c/o DOE w/ some ADLs like housework, etc; we reviewed exercise prescription & she is too sedentary==> try Cardio-Pulm REHAB...    We reviewed prob list, meds, xrays and labs> see below for updates >>  ~  May 09, 2012:  55mo ROV & Nancy Hicks is doing reasonably well at 48; We reviewed the following medical problems during today's office visit >>     Asthma> stable on Advair100 and VentolinHFA rescue inhaler (hasn't needed); she says Pulm Rehab reallly helped, now breathing easily w/o cough, phlegm, hemoptysis, SOB, wheezing, etc; doing her own exercises at home...    BP controlled on Diltiazem240, Losartan50, Lasix20> 130/70 today & she denies CP, palpit, ch in DOE, edema, etc...    Hx PE/ DVT> still on Coumadin followed by DrFagin in [place]...    Chol controlled on Simva10 & Feno160> FLP shows TChol 140, TG 125, HDL 44, LDL 71    She has a  large HH seen on CXRs & hx severe GERD followed by DrPatterson & referred to [facility] but she reports improved now on Nexium 40Bid...    She was adm to [facility] 2/13 w/ diverticulitis & responded to Cipro/ Flagyl; now on Miralax & Levsin 0.125mg  prn...    She also had stable mild Renalinsuffic w/ BUN=38, Creat=1.4 & reminded to stay well hydrated; prev hx of neurogenic bladder & used to have to do self caths but this has resolved itself & she reports continued normal voiding regimen; on Methenamine 1/2Bid for prevention... We reviewed prob list, meds, xrays and labs> see below for updates >>  LABS 2/14:  FLP- at goals on simva10+Feno160;  Chems- ok w/ Creat=1.4;  CBC- ok w/ Hg=11.5;  TSH= 1.56...          Problem  List:   she had PNEUMOVAX 2004, & gets yearly Flu shots... TDAP given 8/11.  ASTHMA (ICD-493.90) - on ADVAIR 100Bid & VentolinHFA Prn... prev required O2 and Medrol but not in some time now- doing well- stable without cough, sputum, SOB, wheezing, CP, etc... prev on allergy vaccine & she stopped it... she has noted incr dyspnea w/ lack of exercise & improved back on exerc program. ~  NOTE:  large HH seen on CXR, & takes Nexium Tid... ~  CXR 2/11 showed large HH, DJD spine, clear lungs, NAD.Marland Kitchen. ~  CXR 2/13 showed large HH, DJD/ scoliosis of spine, NAD.Marland Kitchen. ~  8/13:  She is too sedentary & notes some DOE w/ ADLs; rec to enter cardio-pulm rehab program... ~  2/14: she reports that Pulm Rehab has really helped; now doing her own exercises at home...  PULMONARY EMBOLISM, HX OF (ICD-V12.51) & FACTOR V DEFICIENCY (ICD-286.3) - DVT in right leg and PTE on CTAngio in 4/05 and treated w/ Hep/ COUMADIN (protimes followed in  by DrFagan)... last hosp in 9/06 for R heart cath to r/o pulmHTN & normal pressures were found...  HYPERTENSION (ICD-401.9) - on DILTIAZEM 240mg /d, LOSARTAN 100mg /d, LASIX 40mg - 1/2 tab daily, & K20/d... ~  2DEcho 6/06 showed normal LV wall thicknees & LVF, ? mild pulm HTN w/ RVsys est 30-40 ~  8/12:  BP= 134/76 & she denies CP, palpit, dizzy, SOB, edema... ~  2/13:  BP= 140/60 & she remains asymptomatic... ~  2/14: on Diltiazem240, Losartan50, Lasix20> BP= 130/70 today & she denies CP, palpit, ch in DOE, edema, etc  DEEP VENOUS THROMBOPHLEBITIS, HX OF (ICD-V12.52) - as above, & she wears TED hose, no edema...  HYPERCHOLESTEROLEMIA (ICD-272.0) - on SIMVASTATIN 10mg /d now & FENOFIBRATE 160mg /d... ~  FLP 6/08 (on Lescol & Tricor) showed TChol 165, TG 94, HDL 49, LDL 97... rec- contin same. ~  FLP 7/09 showed TChol 152, TG 123, HDL 45, LDL 83... rec- change to Simva40 + Fenofib160 for $$. ~  FLP 7/10 showed TChol 160, TG 107, HDL 51, LDL 88... continue same. ~  FLP 8/11 (on  Simva10+Feno160) showed TChol 154, TG 102, HDL 55, LDL 79... stable, same Rx. ~  FLP 2/12 (on Simva10+Feno160) showed TChol 152, TG 118, HDL 48, LDL 80 ~  FLP 2/13 (on Simva10+Feno160) showed TChol 172, TG 123, HDL 52, LDL 95 ~  FLP 2/14 on Simva10+Feno160 showed TChol 140, TG 125, HDL 44, LDL 71   GASTROESOPHAGEAL REFLUX DISEASE, SEVERE (ICD-530.81) - she has a large HH seen on routine CXRs; severe GERD followed by DrPatterson; on NEXIUM 40mg - recent decr to 1/d... c/o gas & encouraged to take GasX, Mylicon, BeanO. ~  last EGD 6/06 showed large HH & stricture dilated... ~  GI eval for incr dysphagia 1/12 w/ EGD by DrPatterson showing large prolapsing HH, candida esoph, erosions & polyps> treated w/ Diflucan, Nexium decr to once daily. ~  She reports stable on Nexium 40mg  /d... ~  2013: She's had f/u w/ DrPatterson> chr GERD, intermit solid food dysphagia; he rec Ba Swallow, Manometry & 24H pH probe, and appt at [facility]... ~  7/13:  Ba Swallow showed web-like narrowing in cerv esoph, presbyesoph, tortuous mid-distal esoph, largeHH, spasm distally, 13mm Ba tablet temporarily lodged in mid-esoph... ~  2/14: she reports much improved on Nexium40Bid...  DIVERTICULOSIS, COLON (ICD-562.10) - on LEVSIN 0.125mg  tid prn... ~  colonoscopy 10/00 showed divertics, no polyps. ~  GI f/u by [facility] 10/09- note reviewed... on CITRUCEL daily, & rec to take Phazyme for Gas. ~  [facility] 2/13 at [facility] by [facility] for Diverticulitis, treated w/ Cipro/ Flagyl & improved... ~  CT Abd&Pelvis 2/13 showed largeHH, s/p GB, kidney cysts, acute diverticulitis in prox sigmoid (Rx by DrFagin in [place]) & no complicating features...  RENAL INSUFFICIENCY (ICD-588.9) ~  labs 6/08 showed BUN= 43, Creat= 1.7 ~  labs 7/09 showed BUN= 35, Creat= 1.7 ~  labs 7/10 showed BUN= 51, Creat= 1.8 ~  labs 9/10 showed BUN= 33, Creat= 1.4 ~  labs 2/11 showed BUN= 32, Creat= 2.1, K= 4.6 ~  labs 8/11 showed BUN= 34, Creat= 1.5, K= 4.7 ~   labs 2/12 showed BUN= 37, creat= 1.5, K= 4.9 ~  Labs 2/13 showed BUN= 38, Creat= 1.5, K= 4.3 ~  Labs 2/14 showed BUN= 38, Creat= 1.4, K= 4.4  NEUROGENIC BLADDER (ICD-596.54) - eval and Rx by DrKimbrough in the past... prev had to self-cath regularly, & now voids satis on her own... still on MANDELAMINE for UTI prophylaxis per Urology.  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 also takes ALLOPURINOL 300mg /d for hyperuricemia... ~  labs 7/10 showed Uric= 4.3.Marland Kitchen. rec> continue Allopurinol. ~  labs 8/11 showed Uric= 3.8  OSTEOPENIA (ICD-733.90) - BMD from GYN= DrNeal w/ osteopenia Rx'd w/ ca++, MVI, VitD... ~  7/10: pt reports that DrNeal did VitD level & started her on Vit D 2000 u daily... ~  labs 2/11 showed Vit D level = 54... ~  Labs 2/13 showed Vit D level = 48... Continue supplement.  ANXIETY (ICD-300.00) - on ZOLOFT 100mg - 1/2 tab Qhs...  ANEMIA (ICD-285.9) - hx of intol to all  P O forms of iron therapy... ~  labs 6/08 showed Hg= 11.1.Marland Kitchen. ~  labs 7/09 showed Hg= 10.3.Marland KitchenMarland Kitchen DrPatterson checked labs 10/09 w/ Fe= 77, Ferritin= 26, B12= 382. ~  labs 7/10 showed Hg= 11.0 ~  labs 2/11 showed Hg= 11.6 ~  labs 8/11 showed Hg= 11.9 ~  labs 2/12 showed Hg= 11.4 ~  Labs 2/13 showed Hg= 12.4, MCV= 96 ~  Labs 2/14 showed Hg= 11.5   Past Surgical History  Procedure Laterality Date  . Cholecystectomy    . Salivery glad tumor      right  . Tonsillectomy    . Esophageal manometry  10/26/2011    Procedure: ESOPHAGEAL MANOMETRY (EM);  Surgeon: Mardella Layman, MD;  Location: [facility];  Service: Endoscopy;  Laterality: N/A;    Outpatient Encounter Prescriptions as of 05/09/2012  Medication Sig Dispense Refill  . ADVAIR DISKUS 100-50 MCG/DOSE AEPB INHALE 1 DOSE BY  MOUTH TWICE A DAY  60 each  6  . albuterol (VENTOLIN HFA) 108 (90 BASE) MCG/ACT inhaler Inhale 2  puffs into the lungs every 6 (six) hours as needed for wheezing.  1 Inhaler  2  . allopurinol (ZYLOPRIM) 300 MG tablet Take 300 mg by mouth daily.        . Biotin (SUPER BIOTIN) 5 MG CAPS Take 1 capsule by mouth daily.        . Calcium Citrate-Vitamin D (CITRACAL MAXIMUM) 315-250 MG-UNIT TABS Take 1 capsule by mouth daily.      . Cholecalciferol (VITAMIN D3) 2000 UNITS TABS Take 1 tablet by mouth daily.        Marland Kitchen diltiazem (CARDIZEM CD) 240 MG 24 hr capsule Take 1 capsule (240 mg total) by mouth daily.  90 capsule  3  . esomeprazole (NEXIUM) 40 MG capsule Take one tablet by mouth once a day  30 capsule  11  . fenofibrate 160 MG tablet Take 1 tablet (160 mg total) by mouth daily.  90 tablet  3  . fluticasone (FLONASE) 50 MCG/ACT nasal spray SPRAY 2 SPRAYS IN BOTH   NOSTRILS  16 g  5  . furosemide (LASIX) 40 MG tablet Take 0.5 tablets (20 mg total) by mouth daily.  45 tablet  3  . losartan (COZAAR) 100 MG tablet Take 0.5 tablets (50 mg total) by mouth daily.  90 tablet  3  . methenamine (HIPREX) 1 G tablet Take 500 mg by mouth 2 (two) times daily with a meal.       . methylcellulose (CITRUCEL) oral powder Take 1 tsp by mouth daily       . Multiple Vitamin (MULITIVITAMIN WITH MINERALS) TABS Take 1 tablet by mouth daily.      Bertram Gala Glycol-Propyl Glycol (SYSTANE ULTRA PF) 0.4-0.3 % SOLN Place 2 drops into both eyes 2 (two) times daily.      . potassium chloride SA (K-DUR,KLOR-CON) 20 MEQ tablet Take 1 tablet (20 mEq total) by mouth daily.  90 tablet  3  . sertraline (ZOLOFT) 100 MG tablet Take 1/2 tablet daily  30 tablet  6  . simvastatin (ZOCOR) 10 MG tablet Take 10 mg by mouth at bedtime.        Marland Kitchen warfarin (COUMADIN) 2.5 MG tablet Take by mouth as directed.       . [DISCONTINUED] fluticasone (FLONASE) 50 MCG/ACT nasal spray as directed.       . [DISCONTINUED] sertraline (ZOLOFT) 100 MG tablet take 1 tablet by mouth once daily  30 tablet  11  . [DISCONTINUED] sertraline (ZOLOFT) 100 MG tablet        . furosemide (LASIX) 40 MG tablet TAKE ONE-HALF (1/2) TABLET DAILY  45 tablet  5  . hyoscyamine (LEVSIN SL) 0.125 MG SL tablet Place 0.125 mg under the tongue every 4 (four) hours as needed.       No facility-administered encounter medications on file as of 05/09/2012.    Allergies  Allergen Reactions  . Metoclopramide Hcl Other (See Comments)    REACTION: causes uncontrolable leg movements  . Nitrofurantoin Nausea And Vomiting and Other (See Comments)    REACTION: dizziness    Current Medications, Allergies, Past Medical History, Past Surgical History, Family History, and Social History were reviewed in [employer] record.    Review of Systems    See HPI - all other systems neg except as noted...  The patient complains of dyspnea on exertion.  The patient  denies anorexia, fever, weight loss, weight gain, vision loss, decreased hearing, hoarseness, chest pain, syncope, peripheral edema, prolonged cough, headaches, hemoptysis, abdominal pain, melena, hematochezia, severe indigestion/heartburn, hematuria, incontinence, muscle weakness, suspicious skin lesions, transient blindness, difficulty walking, depression, unusual weight change, abnormal bleeding, enlarged lymph nodes, and angioedema.     Objective:   Physical Exam     WD, WN, 79 y/o WF in NAD GENERAL:  Alert & oriented; pleasant & cooperative... HEENT:  [place]/AT, EOM-wnl, PERRLA, EACs-clear, TMs-wnl, NOSE-clear, THROAT-clear & wnl. NECK:  Supple w/ decrROM; no JVD; normal carotid impulses w/o bruits; no thyromegaly or nodules palpated; no lymphadenopathy. CHEST:  Clear to P & A; without wheezes/ rales/ or rhonchi. HEART:  regular rhythm; without murmurs/ rubs/ or gallops. ABDOMEN:  soft & nontender; normal bowel sounds; no organomegaly or masses detected. EXT: without deformities, mild arthritic changes; no varicose veins/ venous insuffic/ or edema. NEURO:  CN's intact;  no focal neuro deficits... DERM:   No lesions noted; no rash etc...  RADIOLOGY DATA:  Reviewed in the EPIC EMR & discussed w/ the patient...  LABORATORY DATA:  Reviewed in the EPIC EMR & discussed w/ the patient...   Assessment & Plan:    ASTHMA>  Stable on Advair, Ventolin; she is improved s/p cardio[facility]pulm REHAB at [facility]...  Hx DVT/ PTE/ Factor V defic>  Stable on Coumadin w/ protimes per DrFagan in [place]; continue same...  HBP>  Controlled on Cardizem, Losartan, Lasix; continue same...  CHOL>  Stable on Simva + Fenofib and diet;  Continue same...  LargeHH/ GERD>  On NexiumBid now & followed by DrPatterson; continue same meds & vigorous antireflux program; he is in the process of further eval & referral to [facility]...  Divertics>  She had bout of acute diverticulitis 2/13 treated at [facility]  [facility]; symptoms resolved & now on maintenance program...  Renal Insuffic, Hx of Neurogenic Bladder>  Stable on current meds & voiding satis w/o need to self cath, she still takes Mandelamine for UTI prevention...  DJD, Hyperuricemia, DDD, Osteopenia>  She remains on Allopurinol, calcium, MVI, Vit D...  Anemia>  Hg remains stable at 11-12 range...  Anxiety/ Depression>  On ZOLOFT 100mg  taking 1/2 Qhs...   Patient's Medications  New Prescriptions   No medications on file  Previous Medications   ADVAIR DISKUS 100-50 MCG/DOSE AEPB    INHALE 1 DOSE BY MOUTH TWICE A DAY   ALBUTEROL (VENTOLIN HFA) 108 (90 BASE) MCG/ACT INHALER    Inhale 2 puffs into the lungs every 6 (six) hours as needed for wheezing.   ALLOPURINOL (ZYLOPRIM) 300 MG TABLET    Take 300 mg by mouth daily.     BIOTIN (SUPER BIOTIN) 5 MG CAPS    Take 1 capsule by mouth daily.     CALCIUM CITRATE-VITAMIN D (CITRACAL MAXIMUM) 315-250 MG-UNIT TABS    Take 1 capsule by mouth daily.   CHOLECALCIFEROL (VITAMIN D3) 2000 UNITS TABS    Take 1 tablet by mouth daily.     DILTIAZEM (CARDIZEM CD) 240 MG 24 HR CAPSULE    Take 1 capsule (240 mg total) by mouth daily.    ESOMEPRAZOLE (NEXIUM) 40 MG CAPSULE    Take one tablet by mouth once a day   FENOFIBRATE 160 MG TABLET    Take 1 tablet (160 mg total) by mouth daily.   FLUTICASONE (FLONASE) 50 MCG/ACT NASAL SPRAY    SPRAY 2 SPRAYS IN BOTH   NOSTRILS   FUROSEMIDE (LASIX) 40 MG TABLET  TAKE ONE-HALF (1/2) TABLET DAILY   FUROSEMIDE (LASIX) 40 MG TABLET    Take 0.5 tablets (20 mg total) by mouth daily.   HYOSCYAMINE (LEVSIN SL) 0.125 MG SL TABLET    Place 0.125 mg under the tongue every 4 (four) hours as needed.   LOSARTAN (COZAAR) 100 MG TABLET    Take 0.5 tablets (50 mg total) by mouth daily.   METHENAMINE (HIPREX) 1 G TABLET    Take 500 mg by mouth 2 (two) times daily with a meal.    METHYLCELLULOSE (CITRUCEL) ORAL POWDER    Take 1 tsp by mouth daily    MULTIPLE VITAMIN (MULITIVITAMIN WITH MINERALS) TABS    Take 1 tablet by mouth daily.   POLYETHYL GLYCOL-PROPYL GLYCOL (SYSTANE ULTRA PF) 0.4-0.3 % SOLN    Place 2 drops into both eyes 2 (two) times daily.   POTASSIUM CHLORIDE SA (K-DUR,KLOR-CON) 20 MEQ TABLET    Take 1 tablet (20 mEq total) by mouth daily.   SIMVASTATIN (ZOCOR) 10 MG TABLET    Take 10 mg by mouth at bedtime.     WARFARIN (COUMADIN) 2.5 MG TABLET    Take by mouth as directed.   Modified Medications   Modified Medication Previous Medication   SERTRALINE (ZOLOFT) 100 MG TABLET sertraline (ZOLOFT) 100 MG tablet      Take 1/2 tablet daily    take 1 tablet by mouth once daily  Discontinued Medications   FLUTICASONE (FLONASE) 50 MCG/ACT NASAL SPRAY    as directed.    SERTRALINE (ZOLOFT) 100 MG TABLET

## 2012-06-02 ENCOUNTER — Telehealth: Payer: Self-pay | Admitting: Pulmonary Disease

## 2012-06-02 MED ORDER — PREDNISONE (PAK) 5 MG PO TABS: 5.0000 mg | ORAL_TABLET | ORAL | Status: DC

## 2012-06-02 MED ORDER — LEVOFLOXACIN 500 MG PO TABS: 500.0000 mg | ORAL_TABLET | Freq: Every day | ORAL | Status: DC

## 2012-06-02 NOTE — Telephone Encounter (Signed)
Per SN:  Levaquin 500 Take 1 qd #7 Pred Dose pak 5mg  x 6 days  Take as directed Continue Mucinex 2 BID Fluids  Patient aware of recs per SN and that Rx has been called into pharm. [facility] [place], [place] [address]

## 2012-06-02 NOTE — Telephone Encounter (Addendum)
Pt called back again re: same. Wants to get rx soon before weather gets bad. [name] is off today. Nancy Hicks

## 2012-06-02 NOTE — Telephone Encounter (Signed)
Last OV 05-09-12. I spoke with the pt and she is c/o chest congestion, productive cough with yellow phlegm, chest tightness, and fever of 99.5 all x 1 week. Pt states she has been taking mucinex and ventolin without relief. Pt denies any SOB, sinus congestion. Please advise. Carron Curie, CMA Allergies  Allergen Reactions  . Metoclopramide Hcl Other (See Comments)    REACTION: causes uncontrolable leg movements  . Nitrofurantoin Nausea And Vomiting and Other (See Comments)    REACTION: dizziness

## 2012-06-27 ENCOUNTER — Telehealth: Payer: Self-pay | Admitting: Pulmonary Disease

## 2012-06-27 MED ORDER — LOSARTAN POTASSIUM 100 MG PO TABS: 100.0000 mg | ORAL_TABLET | Freq: Every day | ORAL | Status: DC

## 2012-06-27 NOTE — Telephone Encounter (Signed)
I spoke with the pt and she states she switched pharmacies and had rx sent to [facility]. Pt states  when she picked up her losartan she noticed the directions were incorrect. She states she has been taking losartan 100mg  once not, but directions are for 0.5 tablet daily.  According to last OV on 05-13-12 pt meds were as follows: HYPERTENSION (ICD-401.9) - on DILTIAZEM 240mg /d, LOSARTAN 100mg /d, LASIX 40mg - 1/2 tab daily, & K20/d...  But then a little further down it states: ~ 2/14: on Diltiazem240, Losartan50, Lasix20> BP= 130/70 today & she denies CP, palpit, ch in DOE, edema, etc  The pt states she has been taking 100mg  daily and wants to make sure this is what she should be taking. It she is supposed to be taking 100mg  then a new RX will need to be sent with correct directions. Please advise. Carron Curie, CMA Allergies  Allergen Reactions  . Metoclopramide Hcl Other (See Comments)    REACTION: causes uncontrolable leg movements  . Nitrofurantoin Nausea And Vomiting and Other (See Comments)    REACTION: dizziness

## 2012-06-27 NOTE — Telephone Encounter (Signed)
Per SN----   Change the losartan to 100 mg  Daily.  thanks

## 2012-06-27 NOTE — Telephone Encounter (Signed)
X corrected. Pt is aware. Carron Curie, CMA

## 2012-07-04 ENCOUNTER — Other Ambulatory Visit: Payer: Self-pay | Admitting: Pulmonary Disease

## 2012-07-04 MED ORDER — SERTRALINE HCL 100 MG PO TABS: ORAL_TABLET | ORAL | Status: DC

## 2012-07-05 ENCOUNTER — Other Ambulatory Visit: Payer: Self-pay | Admitting: Pulmonary Disease

## 2012-07-05 ENCOUNTER — Telehealth: Payer: Self-pay | Admitting: Pulmonary Disease

## 2012-07-05 NOTE — Telephone Encounter (Signed)
I spoke with [facility]. She stated she was looking at old rx that was on file. She has received current (next rx). She needed nothing further

## 2012-11-07 ENCOUNTER — Ambulatory Visit: Payer: Medicare Other | Admitting: Pulmonary Disease

## 2012-11-21 ENCOUNTER — Ambulatory Visit (INDEPENDENT_AMBULATORY_CARE_PROVIDER_SITE_OTHER): Payer: Medicare Other | Admitting: Pulmonary Disease

## 2012-11-21 ENCOUNTER — Encounter: Payer: Self-pay | Admitting: Pulmonary Disease

## 2012-11-21 VITALS — BP 142/84 | HR 79 | Temp 98.4°F | Ht 60.0 in | Wt 140.2 lb

## 2012-11-21 DIAGNOSIS — N259 Disorder resulting from impaired renal tubular function, unspecified: Secondary | ICD-10-CM

## 2012-11-21 DIAGNOSIS — M1990 Unspecified osteoarthritis, unspecified site: Secondary | ICD-10-CM

## 2012-11-21 DIAGNOSIS — I10 Essential (primary) hypertension: Secondary | ICD-10-CM

## 2012-11-21 DIAGNOSIS — K5730 Diverticulosis of large intestine without perforation or abscess without bleeding: Secondary | ICD-10-CM

## 2012-11-21 DIAGNOSIS — K219 Gastro-esophageal reflux disease without esophagitis: Secondary | ICD-10-CM

## 2012-11-21 DIAGNOSIS — M899 Disorder of bone, unspecified: Secondary | ICD-10-CM

## 2012-11-21 DIAGNOSIS — J45909 Unspecified asthma, uncomplicated: Secondary | ICD-10-CM

## 2012-11-21 DIAGNOSIS — E7800 Pure hypercholesterolemia, unspecified: Secondary | ICD-10-CM

## 2012-11-21 DIAGNOSIS — Z86718 Personal history of other venous thrombosis and embolism: Secondary | ICD-10-CM

## 2012-11-21 DIAGNOSIS — F411 Generalized anxiety disorder: Secondary | ICD-10-CM

## 2012-11-21 MED ORDER — MECLIZINE HCL 25 MG PO TABS: 25.0000 mg | ORAL_TABLET | Freq: Four times a day (QID) | ORAL | Status: DC | PRN

## 2012-11-21 NOTE — Patient Instructions (Addendum)
Today we updated your med list in our EPIC system...    Continue your current medications the same...  We wrote a new prescription for MECLIZINE 25mg  to take 1 tab every 6h as needed for dizziness...  Call for any questions...  Let's plan a follow up visit in 39mo (w/ fasting blood work), sooner if needed for problems.Marland KitchenMarland Kitchen

## 2012-11-21 NOTE — Progress Notes (Signed)
Subjective:    Patient ID: Nancy Hicks, female    DOB: 1926-11-11, 79 y.o.   MRN: [id number]  HPI 79 y/o WF here for a follow up visit... she has multiple medical problems as noted below & is followed both here and in [place] by DrFagin...   ~  November 04, 2010:  434mo ROV & she reports doing well in the interval- no new complaints or concerns;  Her LMD is DrFagin in [place] & she gets her Protimes monitored thru his office;  She had full labs here 2/12 & all wnl x Hg=11.4.Marland KitchenMarland Kitchen    Asthma well controlled on Advair Bid & Ventolin Prn; denies cough, phlegm, SOB (stable DOE w/o change), swelling;  Hx DVT/ PE and Factor V defic on Coumadin monitored thru DrFagin's office...    She reports BP & lipids well controlled on Cardizem, Cozaar, Lasix & KCl; along w/ Simva10 & Fenofibrate160; BP= 134/76 and feeling well> denies HA, fatigue, visual changes, CP, palipit, dizziness, syncope, edema, etc...    She has a large HH, GERD & stable on her Nexium; she reports interval bouts of dudenitis treated by DrFagin...    See prob list below >>  ~  May 04, 2011:  434mo ROV & she reports a quiet interval, no new complaints or concerns;  She sees DrFagin in [place] for primary care & protimes (on coumadin for hx PE/ DVT & Factor V defic);  We follow her Asthma> stable on Advair100 and VentolinHFA rescue inhaler (she has NEVER needed to use this she says)...    BP controlled on diltiazem, Losartan, Lasix> 140/60 today & she denies CP, palpit, ch in DOE, edema, etc...    Chol controlled on diet + Simva & Feno> FLP today looks good (see below)...    She has a large HH seen on CXRs & hx severe GERD followed by drPatterson but she reports well controlled now on Nexium 40mg /d...    She also had stable mild Renalinsuffic w/ BUN=38, Creat=1.5 & reminded to stay well hydrated; prev hx of neurogenic bladder & used to have to do self caths but this has resolved itself 7 she reports continued normal voiding  regimen...  ~  November 02, 2011:  434mo ROV & Nancy Hicks was adm to [facility] 2/13 by [facility] for Diverticulitis confirmed on CT Abd, treated w/ Cipro & Flagyl & improved... She had followed up w/ DrPatterson for her [facility] & intermittent solid food dysphagia ==> see eval below w/ BaSwallow, EsophManometry, ?24H pH probe, & further eval rec at [facility]...  She also c/o DOE w/ some ADLs like housework, etc; we reviewed exercise prescription & she is too sedentary==> try Cardio-Pulm REHAB...    We reviewed prob list, meds, xrays and labs> see below for updates >>  ~  May 09, 2012:  434mo ROV & Nancy Hicks is doing reasonably well at 69; We reviewed the following medical problems during today's office visit >>     Asthma> stable on Advair100 and VentolinHFA rescue inhaler (hasn't needed); she says Pulm Rehab reallly helped, now breathing easily w/o cough, phlegm, hemoptysis, SOB, wheezing, etc; doing her own exercises at home...    BP controlled on Diltiazem240, Losartan50, Lasix20> 130/70 today & she denies CP, palpit, ch in DOE, edema, etc...    Hx PE/ DVT> still on Coumadin followed by DrFagin in [place]...    Chol controlled on Simva10 & Feno160> FLP shows TChol 140, TG 125, HDL 44, LDL 71    She has a  large HH seen on CXRs & hx severe GERD followed by DrPatterson & referred to [facility] but she reports improved now on Nexium 40Bid...    She was adm to [facility] 2/13 w/ diverticulitis & responded to Cipro/ Flagyl; now on Miralax & Levsin 0.125mg  prn...    She also had stable mild Renalinsuffic w/ BUN=38, Creat=1.4 & reminded to stay well hydrated; prev hx of neurogenic bladder & used to have to do self caths but this has resolved itself & she reports continued normal voiding regimen; on Methenamine 1/2Bid for prevention... We reviewed prob list, meds, xrays and labs> see below for updates >>  LABS 2/14:  FLP- at goals on simva10+Feno160;  Chems- ok w/ Creat=1.4;  CBC- ok w/ Hg=11.5;  TSH= 1.56...   ~  November 21, 2012:  21mo ROV & Nancy Hicks is stable x for some intermit dizziness (?assoc w/ recent diarrhea) & we discussed incr fluids & trial of prn Meclizine;  We reviewed the following medical problems during today's office visit >>     Asthma> on Advair100Bid & Proair prn; breathing is good w/o recent exac; also uses OTC Antihist & Flonase as needed for nasal symptoms...    HxPE/ DVT/ FactorV deficiency> on Coumadin via [facility] & doing satis by her report; she denies leg pain, swelling, etc...     HBP> on Cardizem240, Cozaar100, Lasix40-1/2, & K20; BP= 142/84 & occas higher at home; may needs addition of BBlocker but holding off for now; denies CP, palpit, ch in SOB or edema...    CHOL> on Simva10 + Feno160; FLP 2/14 showed TChol 140, TG 125, HDL 44, LDL 71    Hx severe GERD> hx largeHH & severe GERD, followed by DrPatterson & eval at [facility] as well- symptoms improved on Nexium40Bid    Divertics> she had acute diverticulitis 2/13 treated by DrFagin in [place]; doing better since then & denies abd pain, n/v, c/d, blood seen...    Renal Insuffic> Cr in the 1.4 to 1.5 range & stable...    GU- Neurogenic bladder> followed by Urology; on Methenamine500Bid to prevent bladder infections & improved; she used to self cath but has been voiding satis recently...    DJD, hyperuricemia, Osteopenia> Hx Cspine dis w/ myelopathy, followed by [facility] & improved; on Allopurinol300 w/ Uric= 3-4 range on this...    Anxiety> on Zoloft100-1/2 daily...    Anemia> Hg stable in the 11 to 12 range... We reviewed prob list, meds, xrays and labs> see below for updates >>            Problem List:   she had PNEUMOVAX 2004, & gets yearly Flu shots... TDAP given 8/11.  ASTHMA (ICD-493.90) - on ADVAIR 100Bid & VentolinHFA Prn... prev required O2 and Medrol but not in some time now- doing well- stable without cough, sputum, SOB, wheezing, CP, etc... prev on allergy vaccine & she stopped it... she has noted incr dyspnea  w/ lack of exercise & improved back on exerc program. ~  NOTE:  large HH seen on CXR, & takes Nexium Tid... ~  CXR 2/11 showed large HH, DJD spine, clear lungs, NAD.Marland Kitchen. ~  CXR 2/13 showed large HH, DJD/ scoliosis of spine, NAD.Marland Kitchen. ~  8/13:  She is too sedentary & notes some DOE w/ ADLs; rec to enter cardio-pulm rehab program... ~  2/14: she reports that Pulm Rehab has really helped; now doing her own exercises at home... ~  8/14: on Advair100Bid & Proair prn; breathing is good  w/o recent exac; also uses OTC Antihist & Flonase as needed for nasal symptoms.  PULMONARY EMBOLISM, HX OF (ICD-V12.51) & FACTOR V DEFICIENCY (ICD-286.3) - DVT in right leg and PTE on CTAngio in 4/05 and treated w/ Hep/ COUMADIN (protimes followed in [place] by DrFagan)... last hosp in 9/06 for R heart cath to r/o pulmHTN & normal pressures were found...  HYPERTENSION (ICD-401.9) - on DILTIAZEM 240mg /d, LOSARTAN 100mg /d, LASIX 40mg - 1/2 tab daily, & K20/d... ~  2DEcho 6/06 showed normal LV wall thicknees & LVF, ? mild pulm HTN w/ RVsys est 30-40 ~  8/12:  BP= 134/76 & she denies CP, palpit, dizzy, SOB, edema... ~  2/13:  BP= 140/60 & she remains asymptomatic... ~  2/14: on Diltiazem240, Losartan50, Lasix20> BP= 130/70 today & she denies CP, palpit, ch in DOE, edema, etc ~  8/14: on Cardizem240, Cozaar100, Lasix40-1/2, & K20; BP= 142/84 & occas higher at home; may needs addition of BBlocker but holding off for now; denies CP, palpit, ch in SOB or edema.  DEEP VENOUS THROMBOPHLEBITIS, HX OF (ICD-V12.52) - as above, & she wears TED hose, no edema...  HYPERCHOLESTEROLEMIA (ICD-272.0) - on SIMVASTATIN 10mg /d now & FENOFIBRATE 160mg /d... ~  FLP 6/08 (on Lescol & Tricor) showed TChol 165, TG 94, HDL 49, LDL 97... rec- contin same. ~  FLP 7/09 showed TChol 152, TG 123, HDL 45, LDL 83... rec- change to Simva40 + Fenofib160 for $$. ~  FLP 7/10 showed TChol 160, TG 107, HDL 51, LDL 88... continue same. ~  FLP 8/11 (on  Simva10+Feno160) showed TChol 154, TG 102, HDL 55, LDL 79... stable, same Rx. ~  FLP 2/12 (on Simva10+Feno160) showed TChol 152, TG 118, HDL 48, LDL 80 ~  FLP 2/13 (on Simva10+Feno160) showed TChol 172, TG 123, HDL 52, LDL 95 ~  FLP 2/14 on Simva10+Feno160 showed TChol 140, TG 125, HDL 44, LDL 71   GASTROESOPHAGEAL REFLUX DISEASE, SEVERE (ICD-530.81) - she has a large HH seen on routine CXRs; severe GERD followed by DrPatterson; on NEXIUM 40mg - recent decr to 1/d... c/o gas & encouraged to take GasX, Mylicon, BeanO. ~  last EGD 6/06 showed large HH & stricture dilated... ~  GI eval for incr dysphagia 1/12 w/ EGD by DrPatterson showing large prolapsing HH, candida esoph, erosions & polyps> treated w/ Diflucan, Nexium decr to once daily. ~  She reports stable on Nexium 40mg  /d... ~  2013: She's had f/u w/ DrPatterson> chr GERD, intermit solid food dysphagia; he rec Ba Swallow, Manometry & 24H pH probe, and appt at [facility]... ~  7/13:  Ba Swallow showed web-like narrowing in cerv esoph, presbyesoph, tortuous mid-distal esoph, largeHH, spasm distally, 13mm Ba tablet temporarily lodged in mid-esoph... ~  2/14: she reports much improved on Nexium40Bid...  DIVERTICULOSIS, COLON (ICD-562.10) - on LEVSIN 0.125mg  tid prn... ~  colonoscopy 10/00 showed divertics, no polyps. ~  GI f/u by [facility] 10/09- note reviewed... on CITRUCEL daily, & rec to take Phazyme for Gas. ~  [facility] 2/13 at [facility] by [facility] for Diverticulitis, treated w/ Cipro/ Flagyl & improved... ~  CT Abd&Pelvis 2/13 showed largeHH, s/p GB, kidney cysts, acute diverticulitis in prox sigmoid (Rx by DrFagin in ) & no complicating features...  RENAL INSUFFICIENCY (ICD-588.9) ~  labs 6/08 showed BUN= 43, Creat= 1.7 ~  labs 7/09 showed BUN= 35, Creat= 1.7 ~  labs 7/10 showed BUN= 51, Creat= 1.8 ~  labs 9/10 showed BUN= 33, Creat= 1.4 ~  labs 2/11 showed BUN= 32, Creat=  2.1, K= 4.6 ~  labs 8/11 showed BUN= 34, Creat= 1.5, K= 4.7 ~   labs 2/12 showed BUN= 37, creat= 1.5, K= 4.9 ~  Labs 2/13 showed BUN= 38, Creat= 1.5, K= 4.3 ~  Labs 2/14 showed BUN= 38, Creat= 1.4, K= 4.4  NEUROGENIC BLADDER (ICD-596.54) - eval and Rx by DrKimbrough in the past... prev had to self-cath regularly, & now voids satis on her own... still on MANDELAMINE for UTI prophylaxis per Urology.  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 also takes ALLOPURINOL 300mg /d for hyperuricemia... ~  labs 7/10 showed Uric= 4.3.Marland Kitchen. rec> continue Allopurinol. ~  labs 8/11 showed Uric= 3.8  OSTEOPENIA (ICD-733.90) - BMD from GYN= DrNeal w/ osteopenia Rx'd w/ ca++, MVI, VitD... ~  7/10: pt reports that DrNeal did VitD level & started her on Vit D 2000 u daily... ~  labs 2/11 showed Vit D level = 54... ~  Labs 2/13 showed Vit D level = 48... Continue supplement.  ANXIETY (ICD-300.00) - on ZOLOFT 100mg - 1/2 tab Qhs...  ANEMIA (ICD-285.9) - hx of intol to all  P O forms of iron therapy... ~  labs 6/08 showed Hg= 11.1.Marland Kitchen. ~  labs 7/09 showed Hg= 10.3.Marland KitchenMarland Kitchen DrPatterson checked labs 10/09 w/ Fe= 77, Ferritin= 26, B12= 382. ~  labs 7/10 showed Hg= 11.0 ~  labs 2/11 showed Hg= 11.6 ~  labs 8/11 showed Hg= 11.9 ~  labs 2/12 showed Hg= 11.4 ~  Labs 2/13 showed Hg= 12.4, MCV= 96 ~  Labs 2/14 showed Hg= 11.5   Past Surgical History  Procedure Laterality Date  . Cholecystectomy    . Salivery glad tumor      right  . Tonsillectomy    . Esophageal manometry  10/26/2011    Procedure: ESOPHAGEAL MANOMETRY (EM);  Surgeon: Mardella Layman, MD;  Location: [facility];  Service: Endoscopy;  Laterality: N/A;    Outpatient Encounter Prescriptions as of 11/21/2012  Medication Sig Dispense Refill  . ADVAIR DISKUS 100-50 MCG/DOSE AEPB inhale 1 dose by mouth twice a day  60 each  6  . albuterol (VENTOLIN HFA) 108 (90 BASE) MCG/ACT inhaler Inhale 2  puffs into the lungs every 6 (six) hours as needed for wheezing.  1 Inhaler  2  . allopurinol (ZYLOPRIM) 300 MG tablet Take 300 mg by mouth daily.        . Biotin (SUPER BIOTIN) 5 MG CAPS Take 1 capsule by mouth daily.        . Calcium Citrate-Vitamin D (CITRACAL MAXIMUM) 315-250 MG-UNIT TABS Take 1 capsule by mouth daily.      . Cholecalciferol (VITAMIN D3) 2000 UNITS TABS Take 1 tablet by mouth daily.        Marland Kitchen diltiazem (CARDIZEM CD) 240 MG 24 hr capsule Take 1 capsule (240 mg total) by mouth daily.  90 capsule  3  . esomeprazole (NEXIUM) 40 MG capsule Take one tablet by mouth twice a day      . fenofibrate 160 MG tablet Take 1 tablet (160 mg total) by mouth daily.  90 tablet  3  . fluticasone (FLONASE) 50 MCG/ACT nasal spray SPRAY 2 SPRAYS IN BOTH   NOSTRILS  16 g  5  . furosemide (LASIX) 40 MG tablet Take 0.5 tablets (20 mg total) by mouth daily.  45 tablet  3  . losartan (COZAAR) 100 MG tablet Take 1 tablet (100 mg total)  by mouth daily.  90 tablet  3  . methenamine (HIPREX) 1 G tablet Take 500 mg by mouth 2 (two) times daily with a meal.       . methylcellulose (CITRUCEL) oral powder Take 1 tsp by mouth daily       . Multiple Vitamin (MULITIVITAMIN WITH MINERALS) TABS Take 1 tablet by mouth daily.      Bertram Gala Glycol-Propyl Glycol (SYSTANE ULTRA PF) 0.4-0.3 % SOLN Place 2 drops into both eyes 2 (two) times daily.      . potassium chloride SA (K-DUR,KLOR-CON) 20 MEQ tablet Take 1 tablet (20 mEq total) by mouth daily.  90 tablet  3  . sertraline (ZOLOFT) 100 MG tablet Take 1/2 tablet daily  45 tablet  3  . simvastatin (ZOCOR) 10 MG tablet Take 10 mg by mouth at bedtime.        Marland Kitchen warfarin (COUMADIN) 2.5 MG tablet Take by mouth as directed.       . [DISCONTINUED] esomeprazole (NEXIUM) 40 MG capsule Take one tablet by mouth once a day  30 capsule  11  . [DISCONTINUED] furosemide (LASIX) 40 MG tablet TAKE ONE-HALF (1/2) TABLET DAILY  45 tablet  5  . [DISCONTINUED] hyoscyamine (LEVSIN SL)  0.125 MG SL tablet Place 0.125 mg under the tongue every 4 (four) hours as needed.      . [DISCONTINUED] levofloxacin (LEVAQUIN) 500 MG tablet Take 1 tablet (500 mg total) by mouth daily.  7 tablet  0  . [DISCONTINUED] predniSONE (STERAPRED UNI-PAK) 5 MG TABS Take 1 tablet (5 mg total) by mouth as directed.  1 each  0   No facility-administered encounter medications on file as of 11/21/2012.    Allergies  Allergen Reactions  . Metoclopramide Hcl Other (See Comments)    REACTION: causes uncontrolable leg movements  . Nitrofurantoin Nausea And Vomiting and Other (See Comments)    REACTION: dizziness    Current Medications, Allergies, Past Medical History, Past Surgical History, Family History, and Social History were reviewed in [employer] record.    Review of Systems    See HPI - all other systems neg except as noted...  The patient complains of dyspnea on exertion.  The patient denies anorexia, fever, weight loss, weight gain, vision loss, decreased hearing, hoarseness, chest pain, syncope, peripheral edema, prolonged cough, headaches, hemoptysis, abdominal pain, melena, hematochezia, severe indigestion/heartburn, hematuria, incontinence, muscle weakness, suspicious skin lesions, transient blindness, difficulty walking, depression, unusual weight change, abnormal bleeding, enlarged lymph nodes, and angioedema.     Objective:   Physical Exam     WD, WN, 79 y/o WF in NAD GENERAL:  Alert & oriented; pleasant & cooperative... HEENT:  [place]/AT, EOM-wnl, PERRLA, EACs-clear, TMs-wnl, NOSE-clear, THROAT-clear & wnl. NECK:  Supple w/ decrROM; no JVD; normal carotid impulses w/o bruits; no thyromegaly or nodules palpated; no lymphadenopathy. CHEST:  Clear to P & A; without wheezes/ rales/ or rhonchi. HEART:  regular rhythm; without murmurs/ rubs/ or gallops. ABDOMEN:  soft & nontender; normal bowel sounds; no organomegaly or masses detected. EXT: without deformities, mild  arthritic changes; no varicose veins/ venous insuffic/ or edema. NEURO:  CN's intact;  no focal neuro deficits... DERM:  No lesions noted; no rash etc...  RADIOLOGY DATA:  Reviewed in the EPIC EMR & discussed w/ the patient...  LABORATORY DATA:  Reviewed in the EPIC EMR & discussed w/ the patient...   Assessment & Plan:    ASTHMA>  Stable on Advair, Ventolin; she is improved  s/p cardio-pulm REHAB at [facility]...  Hx DVT/ PTE/ Factor V defic>  Stable on Coumadin w/ protimes per DrFagan in [place]; continue same...  HBP>  Controlled on Cardizem, Losartan, Lasix; continue same...  CHOL>  Stable on Simva + Fenofib and diet;  Continue same...  LargeHH/ GERD>  On NexiumBid now & followed by DrPatterson; continue same meds & vigorous antireflux program; he is in the process of further eval & referral to [facility]...  Divertics>  She had bout of acute diverticulitis 2/13 treated at [facility]; symptoms resolved & now on maintenance program...  Renal Insuffic, Hx of Neurogenic Bladder>  Stable on current meds & voiding satis w/o need to self cath, she still takes Mandelamine for UTI prevention...  DJD, Hyperuricemia, DDD, Osteopenia>  She remains on Allopurinol, calcium, MVI, Vit D...  Anemia>  Hg remains stable at 11-12 range...  Anxiety/ Depression>  On ZOLOFT 100mg  taking 1/2 Qhs...   Patient's Medications  New Prescriptions   MECLIZINE (ANTIVERT) 25 MG TABLET    Take 1 tablet (25 mg total) by mouth every 6 (six) hours as needed for dizziness.  Previous Medications   ADVAIR DISKUS 100-50 MCG/DOSE AEPB    inhale 1 dose by mouth twice a day   ALBUTEROL (VENTOLIN HFA) 108 (90 BASE) MCG/ACT INHALER    Inhale 2 puffs into the lungs every 6 (six) hours as needed for wheezing.   ALLOPURINOL (ZYLOPRIM) 300 MG TABLET    Take 300 mg by mouth daily.     BIOTIN (SUPER BIOTIN) 5 MG CAPS    Take 1 capsule by mouth daily.     CALCIUM CITRATE-VITAMIN D (CITRACAL MAXIMUM) 315-250 MG-UNIT  TABS    Take 1 capsule by mouth daily.   CHOLECALCIFEROL (VITAMIN D3) 2000 UNITS TABS    Take 1 tablet by mouth daily.     DILTIAZEM (CARDIZEM CD) 240 MG 24 HR CAPSULE    Take 1 capsule (240 mg total) by mouth daily.   FENOFIBRATE 160 MG TABLET    Take 1 tablet (160 mg total) by mouth daily.   FLUTICASONE (FLONASE) 50 MCG/ACT NASAL SPRAY    SPRAY 2 SPRAYS IN BOTH   NOSTRILS   FUROSEMIDE (LASIX) 40 MG TABLET    Take 0.5 tablets (20 mg total) by mouth daily.   LOSARTAN (COZAAR) 100 MG TABLET    Take 1 tablet (100 mg total) by mouth daily.   METHENAMINE (HIPREX) 1 G TABLET    Take 500 mg by mouth 2 (two) times daily with a meal.    METHYLCELLULOSE (CITRUCEL) ORAL POWDER    Take 1 tsp by mouth daily    MULTIPLE VITAMIN (MULITIVITAMIN WITH MINERALS) TABS    Take 1 tablet by mouth daily.   POLYETHYL GLYCOL-PROPYL GLYCOL (SYSTANE ULTRA PF) 0.4-0.3 % SOLN    Place 2 drops into both eyes 2 (two) times daily.   POTASSIUM CHLORIDE SA (K-DUR,KLOR-CON) 20 MEQ TABLET    Take 1 tablet (20 mEq total) by mouth daily.   SERTRALINE (ZOLOFT) 100 MG TABLET    Take 1/2 tablet daily   SIMVASTATIN (ZOCOR) 10 MG TABLET    Take 10 mg by mouth at bedtime.     WARFARIN (COUMADIN) 2.5 MG TABLET    Take by mouth as directed.   Modified Medications   Modified Medication Previous Medication   ESOMEPRAZOLE (NEXIUM) 40 MG CAPSULE esomeprazole (NEXIUM) 40 MG capsule      Take one tablet by mouth twice a day  Take one tablet by mouth once a day  Discontinued Medications   FUROSEMIDE (LASIX) 40 MG TABLET    TAKE ONE-HALF (1/2) TABLET DAILY   HYOSCYAMINE (LEVSIN SL) 0.125 MG SL TABLET    Place 0.125 mg under the tongue every 4 (four) hours as needed.   LEVOFLOXACIN (LEVAQUIN) 500 MG TABLET    Take 1 tablet (500 mg total) by mouth daily.   PREDNISONE (STERAPRED UNI-PAK) 5 MG TABS    Take 1 tablet (5 mg total) by mouth as directed.

## 2013-01-02 ENCOUNTER — Other Ambulatory Visit: Payer: Self-pay | Admitting: Obstetrics & Gynecology

## 2013-01-02 DIAGNOSIS — R928 Other abnormal and inconclusive findings on diagnostic imaging of breast: Secondary | ICD-10-CM

## 2013-01-10 NOTE — Addendum Note (Signed)
Encounter addended by: Angelica Pou, RN on: 01/10/2013  7:28 AM<BR>     Documentation filed: Notes Section

## 2013-01-10 NOTE — Progress Notes (Signed)
Pulmonary Rehabilitation Program Outcomes Report   Orientation:  12/08/2011 Graduate Date:  03/02/2012 Discharge Date:  03/02/2012 # of sessions completed: 24 DX: Other Respiratory (Asthma)  Pulmonologist: Alroy Dust Family MD:  Carylon Perches Class Time:  13:00  A.  Exercise Program:  Tolerates exercise @ 3.99 METS for 15 minutes, Walk Test Results:  Pre: Pre walk Test: REsting HR 84, BP110/60, O2 97%, RPE6 and RPD 6. 6 min HR 93, BP 130/72, O2 98%, RPE 13 and RPD 13. Post HR was 83, BP 128/68,O2 97%, RPE 9 and RPD9. Walked 0.25 mi at !.7 METS. , No Change  muscular strength  28.6 %, No Change  flexibility 10.4 % and Discharged to home exercise program.  Anticipated compliance:  good  B.  Mental Health:  Good mental attitude  C.  Education/Instruction/Skills  Accurately checks own pulse.  Rest:  80  Exercise:  106, Knows THR for exercise, Uses Perceived Exertion Scale and/or Dyspnea Scale and Attended 14 education classes  Demonstrates accurate pursed lip breathing  D.  Nutrition/Weight Control/Body Composition:  Adherence to prescribed nutrition program: good  and Patient weight change: increased by 1 lb   E.  Blood Lipids    Lab Results  Component Value Date   CHOL 140 05/09/2012   HDL 44.30 05/09/2012   LDLCALC 71 05/09/2012   TRIG 125.0 05/09/2012   CHOLHDL 3 05/09/2012    F.  Lifestyle Changes:  Making positive lifestyle changes  G.  Symptoms noted with exercise:  Asymptomatic  Report Completed By:  Lelon Huh. Karisa Nesser RN   Comments:  This is patients graduation Report. She achieved a peak METS of 3.99. She has done well in Rehab.Her resting HR was 80 and resting BP was 124/60 and her peak HR was 106 and peak BP was 142/62.

## 2013-01-10 NOTE — Addendum Note (Signed)
Encounter addended by: Angelica Pou, RN on: 01/10/2013  7:29 AM<BR>     Documentation filed: Clinical Notes

## 2013-01-12 ENCOUNTER — Ambulatory Visit
Admission: RE | Admit: 2013-01-12 | Discharge: 2013-01-12 | Disposition: A | Discharge status: Home / Self Care | Payer: Medicare Other | Source: Ambulatory Visit | Attending: Obstetrics & Gynecology | Admitting: Obstetrics & Gynecology

## 2013-01-12 DIAGNOSIS — R928 Other abnormal and inconclusive findings on diagnostic imaging of breast: Secondary | ICD-10-CM

## 2013-02-03 ENCOUNTER — Telehealth: Payer: Self-pay | Admitting: Pulmonary Disease

## 2013-02-03 ENCOUNTER — Other Ambulatory Visit: Payer: Self-pay | Admitting: Pulmonary Disease

## 2013-02-03 MED ORDER — FLUTICASONE-SALMETEROL 100-50 MCG/DOSE IN AEPB: INHALATION_SPRAY | RESPIRATORY_TRACT | Status: DC

## 2013-02-03 NOTE — Telephone Encounter (Signed)
Pt advised. Nancy Hicks, CMA  

## 2013-02-03 NOTE — Telephone Encounter (Signed)
lmomtcb x1 for pt--RX sent

## 2013-03-15 ENCOUNTER — Telehealth: Payer: Self-pay | Admitting: Pulmonary Disease

## 2013-03-15 NOTE — Telephone Encounter (Signed)
lmomtcb x1 

## 2013-03-16 MED ORDER — SERTRALINE HCL 50 MG PO TABS: 50.0000 mg | ORAL_TABLET | Freq: Every day | ORAL | Status: DC

## 2013-03-16 MED ORDER — FUROSEMIDE 20 MG PO TABS: 20.0000 mg | ORAL_TABLET | Freq: Every day | ORAL | Status: DC

## 2013-03-16 NOTE — Telephone Encounter (Signed)
Per pt med list she has taking lasix 40mg  1/2 tablet daily, and sertraline 100mg , 1/2 tablet daily. Pt is wanting to have lasix 20mg  called in and zoloft 50mg  so she doe snot have to cut tablets in half. Rx sent. Carron Curie, CMA

## 2013-03-29 ENCOUNTER — Other Ambulatory Visit: Payer: Self-pay | Admitting: Pulmonary Disease

## 2013-04-16 ENCOUNTER — Other Ambulatory Visit: Payer: Self-pay | Admitting: Pulmonary Disease

## 2013-05-29 ENCOUNTER — Other Ambulatory Visit (INDEPENDENT_AMBULATORY_CARE_PROVIDER_SITE_OTHER): Payer: Medicare Other

## 2013-05-29 ENCOUNTER — Encounter: Payer: Self-pay | Admitting: Pulmonary Disease

## 2013-05-29 ENCOUNTER — Ambulatory Visit (INDEPENDENT_AMBULATORY_CARE_PROVIDER_SITE_OTHER): Payer: Medicare Other | Admitting: Pulmonary Disease

## 2013-05-29 ENCOUNTER — Ambulatory Visit (INDEPENDENT_AMBULATORY_CARE_PROVIDER_SITE_OTHER)
Admission: RE | Admit: 2013-05-29 | Discharge: 2013-05-29 | Disposition: A | Discharge status: Home / Self Care | Payer: Medicare Other | Source: Ambulatory Visit | Attending: Pulmonary Disease | Admitting: Pulmonary Disease

## 2013-05-29 VITALS — BP 120/70 | HR 86 | Temp 99.3°F | Ht 60.0 in | Wt 140.0 lb

## 2013-05-29 DIAGNOSIS — K5730 Diverticulosis of large intestine without perforation or abscess without bleeding: Secondary | ICD-10-CM

## 2013-05-29 DIAGNOSIS — J45909 Unspecified asthma, uncomplicated: Secondary | ICD-10-CM

## 2013-05-29 DIAGNOSIS — N259 Disorder resulting from impaired renal tubular function, unspecified: Secondary | ICD-10-CM

## 2013-05-29 DIAGNOSIS — D682 Hereditary deficiency of other clotting factors: Secondary | ICD-10-CM

## 2013-05-29 DIAGNOSIS — F411 Generalized anxiety disorder: Secondary | ICD-10-CM

## 2013-05-29 DIAGNOSIS — J069 Acute upper respiratory infection, unspecified: Secondary | ICD-10-CM

## 2013-05-29 DIAGNOSIS — M1990 Unspecified osteoarthritis, unspecified site: Secondary | ICD-10-CM

## 2013-05-29 DIAGNOSIS — R1319 Other dysphagia: Secondary | ICD-10-CM

## 2013-05-29 DIAGNOSIS — I10 Essential (primary) hypertension: Secondary | ICD-10-CM

## 2013-05-29 DIAGNOSIS — K219 Gastro-esophageal reflux disease without esophagitis: Secondary | ICD-10-CM

## 2013-05-29 DIAGNOSIS — M899 Disorder of bone, unspecified: Secondary | ICD-10-CM

## 2013-05-29 DIAGNOSIS — E7800 Pure hypercholesterolemia, unspecified: Secondary | ICD-10-CM

## 2013-05-29 DIAGNOSIS — M949 Disorder of cartilage, unspecified: Secondary | ICD-10-CM

## 2013-05-29 DIAGNOSIS — D649 Anemia, unspecified: Secondary | ICD-10-CM

## 2013-05-29 DIAGNOSIS — Z8672 Personal history of thrombophlebitis: Secondary | ICD-10-CM

## 2013-05-29 LAB — HEPATIC FUNCTION PANEL
ALK PHOS: 49 U/L (ref 39–117)
ALT: 15 U/L (ref 0–35)
AST: 25 U/L (ref 0–37)
Albumin: 3.7 g/dL (ref 3.5–5.2)
BILIRUBIN DIRECT: 0.1 mg/dL (ref 0.0–0.3)
TOTAL PROTEIN: 6.9 g/dL (ref 6.0–8.3)
Total Bilirubin: 0.7 mg/dL (ref 0.3–1.2)

## 2013-05-29 LAB — CBC WITH DIFFERENTIAL/PLATELET
BASOS ABS: 0 10*3/uL (ref 0.0–0.1)
Basophils Relative: 0.2 % (ref 0.0–3.0)
EOS ABS: 0 10*3/uL (ref 0.0–0.7)
Eosinophils Relative: 0.4 % (ref 0.0–5.0)
HCT: 36.3 % (ref 36.0–46.0)
Hemoglobin: 11.9 g/dL — ABNORMAL LOW (ref 12.0–15.0)
LYMPHS PCT: 24.4 % (ref 12.0–46.0)
Lymphs Abs: 1.5 10*3/uL (ref 0.7–4.0)
MCHC: 32.7 g/dL (ref 30.0–36.0)
MCV: 95 fl (ref 78.0–100.0)
Monocytes Absolute: 0.4 10*3/uL (ref 0.1–1.0)
Monocytes Relative: 6.9 % (ref 3.0–12.0)
Neutro Abs: 4.1 10*3/uL (ref 1.4–7.7)
Neutrophils Relative %: 68.1 % (ref 43.0–77.0)
Platelets: 246 10*3/uL (ref 150.0–400.0)
RBC: 3.82 Mil/uL — ABNORMAL LOW (ref 3.87–5.11)
RDW: 14.8 % — AB (ref 11.5–14.6)
WBC: 6 10*3/uL (ref 4.5–10.5)

## 2013-05-29 LAB — BASIC METABOLIC PANEL
BUN: 35 mg/dL — ABNORMAL HIGH (ref 6–23)
CO2: 27 meq/L (ref 19–32)
Calcium: 9.5 mg/dL (ref 8.4–10.5)
Chloride: 104 mEq/L (ref 96–112)
Creatinine, Ser: 1.6 mg/dL — ABNORMAL HIGH (ref 0.4–1.2)
GFR: 32.09 mL/min — ABNORMAL LOW (ref >60.00)
Glucose, Bld: 94 mg/dL (ref 70–99)
Potassium: 4.5 mEq/L (ref 3.5–5.1)
SODIUM: 138 meq/L (ref 135–145)

## 2013-05-29 LAB — LIPID PANEL
CHOL/HDL RATIO: 3
Cholesterol: 153 mg/dL (ref 0–200)
HDL: 49.1 mg/dL (ref >39.00)
LDL Cholesterol: 81 mg/dL (ref 0–99)
Triglycerides: 114 mg/dL (ref 0.0–149.0)
VLDL: 22.8 mg/dL (ref 0.0–40.0)

## 2013-05-29 LAB — TSH: TSH: 1.39 u[IU]/mL (ref 0.35–5.50)

## 2013-05-29 MED ORDER — METHYLPREDNISOLONE (PAK) 4 MG PO TABS: ORAL_TABLET | ORAL | Status: DC

## 2013-05-29 MED ORDER — AZITHROMYCIN 250 MG PO TABS
ORAL_TABLET | ORAL | Status: DC
Start: 2013-05-29 — End: 2013-11-29

## 2013-05-29 MED ORDER — SERTRALINE HCL 50 MG PO TABS: 50.0000 mg | ORAL_TABLET | Freq: Every day | ORAL | Status: DC

## 2013-05-29 MED ORDER — FUROSEMIDE 20 MG PO TABS: 20.0000 mg | ORAL_TABLET | Freq: Every day | ORAL | Status: DC

## 2013-05-29 NOTE — Progress Notes (Signed)
Subjective:    Patient ID: Nancy Hicks, female    DOB: 1926-06-12, 80 y.o.   MRN: [id number]  HPI 80 y/o WF here for a follow up visit... she has multiple medical problems as noted below & is followed both here and in [place] by DrFagin...   ~  November 04, 2010:  51mo ROV & she reports doing well in the interval- no new complaints or concerns;  Her LMD is DrFagin in [place] & she gets her Protimes monitored thru his office;  She had full labs here 2/12 & all wnl x Hg=11.4.Marland KitchenMarland Kitchen    Asthma well controlled on Advair Bid & Ventolin Prn; denies cough, phlegm, SOB (stable DOE w/o change), swelling;  Hx DVT/ PE and Factor V defic on Coumadin monitored thru DrFagin's office...    She reports BP & lipids well controlled on Cardizem, Cozaar, Lasix & KCl; along w/ Simva10 & Fenofibrate160; BP= 134/76 and feeling well> denies HA, fatigue, visual changes, CP, palipit, dizziness, syncope, edema, etc...    She has a large HH, GERD & stable on her Nexium; she reports interval bouts of dudenitis treated by DrFagin...    See prob list below >>  ~  May 04, 2011:  23mo ROV & she reports a quiet interval, no new complaints or concerns;  She sees DrFagin in [place] for primary care & protimes (on coumadin for hx PE/ DVT & Factor V defic);  We follow her Asthma> stable on Advair100 and VentolinHFA rescue inhaler (she has NEVER needed to use this she says)...    BP controlled on diltiazem, Losartan, Lasix> 140/60 today & she denies CP, palpit, ch in DOE, edema, etc...    Chol controlled on diet + Simva & Feno> FLP today looks good (see below)...    She has a large HH seen on CXRs & hx severe GERD followed by drPatterson but she reports well controlled now on Nexium 40mg /d...    She also had stable mild Renalinsuffic w/ BUN=38, Creat=1.5 & reminded to stay well hydrated; prev hx of neurogenic bladder & used to have to do self caths but this has resolved itself 7 she reports continued normal voiding  regimen...  ~  November 02, 2011:  71mo ROV & Nancy Hicks was adm to [facility] 2/13 by [facility] for Diverticulitis confirmed on CT Abd, treated w/ Cipro & Flagyl & improved... She had followed up w/ DrPatterson for her [facility] & intermittent solid food dysphagia ==> see eval below w/ BaSwallow, EsophManometry, ?24H pH probe, & further eval rec at [facility]...  She also c/o DOE w/ some ADLs like housework, etc; we reviewed exercise prescription & she is too sedentary==> try Cardio-Pulm REHAB...    We reviewed prob list, meds, xrays and labs> see below for updates >>  ~  May 09, 2012:  21mo ROV & Nancy Hicks is doing reasonably well at 46; We reviewed the following medical problems during today's office visit >>     Asthma> stable on Advair100 and VentolinHFA rescue inhaler (hasn't needed); she says Pulm Rehab reallly helped, now breathing easily w/o cough, phlegm, hemoptysis, SOB, wheezing, etc; doing her own exercises at home...    BP controlled on Diltiazem240, Losartan50, Lasix20> 130/70 today & she denies CP, palpit, ch in DOE, edema, etc...    Hx PE/ DVT> still on Coumadin followed by DrFagin in [place]...    Chol controlled on Simva10 & Feno160> FLP shows TChol 140, TG 125, HDL 44, LDL 71    She has a  large HH seen on CXRs & hx severe GERD followed by DrPatterson & referred to [facility] but she reports improved now on Nexium 40Bid...    She was adm to [facility] 2/13 w/ diverticulitis & responded to Cipro/ Flagyl; now on Miralax & Levsin 0.125mg  prn...    She also had stable mild Renalinsuffic w/ BUN=38, Creat=1.4 & reminded to stay well hydrated; prev hx of neurogenic bladder & used to have to do self caths but this has resolved itself & she reports continued normal voiding regimen; on Methenamine 1/2Bid for prevention... We reviewed prob list, meds, xrays and labs> see below for updates >>  LABS 2/14:  FLP- at goals on simva10+Feno160;  Chems- ok w/ Creat=1.4;  CBC- ok w/ Hg=11.5;  TSH= 1.56...   ~  November 21, 2012:  77mo ROV & Nancy Hicks is stable x for some intermit dizziness (?assoc w/ recent diarrhea) & we discussed incr fluids & trial of prn Meclizine;  We reviewed the following medical problems during today's office visit >>     Asthma> on Advair100Bid & Proair prn; breathing is good w/o recent exac; also uses OTC Antihist & Flonase as needed for nasal symptoms...    HxPE/ DVT/ FactorV deficiency> on Coumadin via [place] [facility] & doing satis by her report; she denies leg pain, swelling, etc...     HBP> on Cardizem240, Cozaar100, Lasix40-1/2, & K20; BP= 142/84 & occas higher at home; may need addition of BBlocker but holding off for now; denies CP, palpit, ch in SOB or edema...    CHOL> on Simva10 + Feno160; FLP 2/14 showed TChol 140, TG 125, HDL 44, LDL 71    Hx severe GERD> hx largeHH & severe GERD, followed by DrPatterson & eval at [facility] as well- symptoms improved on Nexium40Bid    Divertics> she had acute diverticulitis 2/13 treated by DrFagin in [place]; doing better since then & denies abd pain, n/v, c/d, blood seen...    Renal Insuffic> Cr in the 1.4 to 1.5 range & stable...    GU- Neurogenic bladder> followed by Urology; on Methenamine500Bid to prevent bladder infections & improved; she used to self cath but has been voiding satis recently...    DJD, hyperuricemia, Osteopenia> Hx Cspine dis w/ myelopathy, followed by [facility] & improved; on Allopurinol300 w/ Uric= 3-4 range on this...    Anxiety> on Zoloft100-1/2 daily...    Anemia> Hg stable in the 11 to 12 range... We reviewed prob list, meds, xrays and labs> see below for updates >>   ~  May 29, 2013:  49mo ROV & Nancy Hicks is c/o some sinusitis, left ear discomfort, low grade temp, and feeling rundown;  She is blowing out some yellow mucus, no blood, & has left sided sinus HA;  We discussed Rx w/ ZPak, Flonase, & Medrol Dosepak for relief, along w/ Mucinex, Saline, & fluids...  We reviewed the following medical problems during  today's office visit >>     Asthma> on Advair100Bid & Proair prn; breathing is good w/o recent exac; also uses OTC Antihist & Flonase as needed for nasal symptoms...    HxPE/ DVT/ FactorV deficiency> on Coumadin via [place] coumadin clinic & doing satis by her report; she denies leg pain, swelling, etc...     HBP> on Cardizem240, Cozaar100, Lasix40-1/2, & K20; BP= 120/70 & occas higher at home; denies CP, palpit, ch in SOB or edema...    CHOL> on Simva10 + Feno160; FLP 3/15 showed TChol 153, TG 114, HDL 49, LDL  45    Hx severe GERD> hx largeHH & severe GERD, followed by DrPatterson & eval at [facility] as well- symptoms improved on Nexium40Bid    Divertics> on Citrucel daily; she had acute diverticulitis 2/13 treated by DrFagin in [place]; doing better since then & denies abd pain, n/v, c/d, blood seen...    Renal Insuffic> hx mild RI w/ BUN=35, Cr= 1.6    GU- Neurogenic bladder> followed by Urology; on Methenamine500Bid to prevent bladder infections & improved; she used to self cath but has been voiding satis recently...    DJD, hyperuricemia, Osteopenia> Hx Cspine dis w/ myelopathy, followed by [facility] & improved; on Allopurinol300 w/ Uric= 3-4 range on this...    Anxiety> on Zoloft100-1/2 daily...    Anemia> Hg stable in the 11 to 12 range... We reviewed prob list, meds, xrays and labs> see below for updates >>  CXR 3/15 showed norm heart size, largeHH, scoliosis, clear lungs & NAD... LABS 3/15:  FLP at goals on Simva10 & Feno160;  Chems- ok x mild RI w/ BUN=35, Cr=1.6;  CBC- ok x Hg=11.9;  TSH=1.39           Problem List:   she had PNEUMOVAX 2004, & gets yearly Flu shots... TDAP given 8/11.  ASTHMA (ICD-493.90) - on ADVAIR 100Bid & VentolinHFA Prn... prev required O2 and Medrol but not in some time now- doing well- stable without cough, sputum, SOB, wheezing, CP, etc... prev on allergy vaccine & she stopped it... she has noted incr dyspnea w/ lack of exercise & improved back on exerc  program. ~  NOTE:  large HH seen on CXR, & takes Nexium Tid... ~  CXR 2/11 showed large HH, DJD spine, clear lungs, NAD.Marland Kitchen. ~  CXR 2/13 showed large HH, DJD/ scoliosis of spine, NAD.Marland Kitchen. ~  8/13:  She is too sedentary & notes some DOE w/ ADLs; rec to enter cardio-pulm rehab program... ~  2/14: she reports that [place] has really helped; now doing her own exercises at home... ~  8/14: on Advair100Bid & Proair prn; breathing is good w/o recent exac; also uses OTC Antihist & Flonase as needed for nasal symptoms.  PULMONARY EMBOLISM, HX OF (ICD-V12.51) & FACTOR V DEFICIENCY (ICD-286.3) - DVT in right leg and PTE on CTAngio in 4/05 and treated w/ Hep/ COUMADIN (protimes followed in [place] by DrFagan)... last hosp in 9/06 for R heart cath to r/o pulmHTN & normal pressures were found...  HYPERTENSION (ICD-401.9) - on DILTIAZEM 240mg /d, LOSARTAN 100mg /d, LASIX 40mg - 1/2 tab daily, & K20/d... ~  2DEcho 6/06 showed normal LV wall thicknees & LVF, ? mild pulm HTN w/ RVsys est 30-40 ~  8/12:  BP= 134/76 & she denies CP, palpit, dizzy, SOB, edema... ~  2/13:  BP= 140/60 & she remains asymptomatic... ~  2/14: on Diltiazem240, Losartan50, Lasix20> BP= 130/70 today & she denies CP, palpit, ch in DOE, edema, etc ~  8/14: on Cardizem240, Cozaar100, Lasix40-1/2, & K20; BP= 142/84 & occas higher at home; may needs addition of BBlocker but holding off for now; denies CP, palpit, ch in SOB or edema.  DEEP VENOUS THROMBOPHLEBITIS, HX OF (ICD-V12.52) - as above, & she wears TED hose, no edema...  HYPERCHOLESTEROLEMIA (ICD-272.0) - on SIMVASTATIN 10mg /d now & FENOFIBRATE 160mg /d... ~  FLP 6/08 (on Lescol & Tricor) showed TChol 165, TG 94, HDL 49, LDL 97... rec- contin same. ~  [place] 7/09 showed TChol 152, TG 123, HDL 45, LDL 83... rec- change to Simva40 + Fenofib160 for $$. ~  FLP 7/10 showed TChol 160, TG 107, HDL 51, LDL 88... continue same. ~  [place] 8/11 (on Simva10+Feno160) showed TChol 154, TG 102, HDL 55, LDL  79... stable, same Rx. ~  [place] 2/12 (on Simva10+Feno160) showed TChol 152, TG 118, HDL 48, LDL 80 ~  FLP 2/13 (on Simva10+Feno160) showed TChol 172, TG 123, HDL 52, LDL 95 ~  FLP 2/14 on Simva10+Feno160 showed TChol 140, TG 125, HDL 44, LDL 71   GASTROESOPHAGEAL REFLUX DISEASE, SEVERE (ICD-530.81) - she has a large HH seen on routine CXRs; severe GERD followed by DrPatterson; on [place] 40mg - recent decr to 1/d... c/o gas & encouraged to take GasX, Mylicon, BeanO. ~  last EGD 6/06 showed large HH & stricture dilated... ~  GI eval for incr dysphagia 1/12 w/ EGD by DrPatterson showing large prolapsing HH, candida esoph, erosions & polyps> treated w/ Diflucan, Nexium decr to once daily. ~  She reports stable on Nexium 40mg  /d... ~  2013: She's had f/u w/ DrPatterson> chr GERD, intermit solid food dysphagia; he rec Ba Swallow, Manometry & 24H pH probe, and appt at [facility]... ~  7/13:  Ba Swallow showed web-like narrowing in cerv esoph, presbyesoph, tortuous mid-distal esoph, largeHH, spasm distally, 33mm Ba tablet temporarily lodged in mid-esoph... ~  2/14: she reports much improved on Nexium40Bid...  DIVERTICULOSIS, COLON (ICD-562.10) - on LEVSIN 0.125mg  tid prn... ~  colonoscopy 10/00 showed divertics, no polyps. ~  GI f/u by [facility] 10/09- note reviewed... on CITRUCEL daily, & rec to take Phazyme for Gas. ~  [facility] 2/13 at [facility] by [facility] for Diverticulitis, treated w/ Cipro/ Flagyl & improved... ~  CT Abd&Pelvis 2/13 showed [place], s/p GB, kidney cysts, acute diverticulitis in prox sigmoid (Rx by DrFagin in [place]) & no complicating features...  RENAL INSUFFICIENCY (ICD-588.9) ~  labs 6/08 showed BUN= 43, Creat= 1.7 ~  labs 7/09 showed BUN= 35, Creat= 1.7 ~  labs 7/10 showed BUN= 51, Creat= 1.8 ~  labs 9/10 showed BUN= 33, Creat= 1.4 ~  labs 2/11 showed BUN= 32, Creat= 2.1, K= 4.6 ~  labs 8/11 showed BUN= 34, Creat= 1.5, K= 4.7 ~  labs 2/12 showed BUN= 37, creat= 1.5, K= 4.9 ~  Labs  2/13 showed BUN= 38, Creat= 1.5, K= 4.3 ~  Labs 2/14 showed BUN= 38, Creat= 1.4, K= 4.4  NEUROGENIC BLADDER (ICD-596.54) - eval and Rx by DrKimbrough in the past... prev had to self-cath regularly, & now voids satis on her own... still on MANDELAMINE for UTI prophylaxis per Urology.  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 also takes ALLOPURINOL 300mg /d for hyperuricemia... ~  labs 7/10 showed Uric= 4.3.Marland Kitchen. rec> continue Allopurinol. ~  labs 8/11 showed Uric= 3.8  OSTEOPENIA (ICD-733.90) - BMD from GYN= DrNeal w/ osteopenia Rx'd w/ ca++, MVI, VitD... ~  7/10: pt reports that DrNeal did VitD level & started her on Vit D 2000 u daily... ~  labs 2/11 showed Vit D level = 54... ~  Labs 2/13 showed Vit D level = 48... Continue supplement.  ANXIETY (ICD-300.00) - on ZOLOFT 100mg - 1/2 tab Qhs...  ANEMIA (ICD-285.9) - hx of intol to all  P O forms of iron therapy... ~  labs 6/08 showed Hg= 11.1.Marland Kitchen. ~  labs 7/09 showed Hg= 10.3.Marland KitchenMarland Kitchen DrPatterson checked labs 10/09 w/ Fe= 77, Ferritin= 26, B12= 382. ~  labs 7/10 showed Hg= 11.0 ~  labs 2/11  showed Hg= 11.6 ~  labs 8/11 showed Hg= 11.9 ~  labs 2/12 showed Hg= 11.4 ~  Labs 2/13 showed Hg= 12.4, MCV= 96 ~  Labs 2/14 showed Hg= 11.5   Past Surgical History  Procedure Laterality Date  . Cholecystectomy    . Salivery glad tumor      right  . Tonsillectomy    . Esophageal manometry  10/26/2011    Procedure: ESOPHAGEAL MANOMETRY (EM);  Surgeon: Sable Feil, MD;  Location: [facility];  Service: Endoscopy;  Laterality: N/A;    Outpatient Encounter Prescriptions as of 05/29/2013  Medication Sig  . ADVAIR DISKUS 100-50 MCG/DOSE AEPB inhale 1 dose by mouth twice a day  . albuterol (VENTOLIN HFA) 108 (90 BASE) MCG/ACT inhaler Inhale 2 puffs into the lungs every 6 (six) hours as needed for wheezing.  Marland Kitchen allopurinol  (ZYLOPRIM) 300 MG tablet Take 300 mg by mouth daily.    . Biotin 5000 MCG CAPS Take 1 capsule by mouth daily.  . Calcium Citrate-Vitamin D (CITRACAL MAXIMUM) 315-250 MG-UNIT TABS Take 1 capsule by mouth daily.  . Cholecalciferol (VITAMIN D3) 2000 UNITS TABS Take 1 tablet by mouth daily.    Marland Kitchen diltiazem (CARDIZEM CD) 240 MG 24 hr capsule Take 1 capsule (240 mg total) by mouth daily.  Marland Kitchen esomeprazole (NEXIUM) 40 MG capsule Take one tablet by mouth twice a day  . fenofibrate 160 MG tablet take 1 tablet once daily  . fluticasone (FLONASE) 50 MCG/ACT nasal spray SPRAY 2 SPRAYS IN BOTH   NOSTRILS  . furosemide (LASIX) 20 MG tablet Take 1 tablet (20 mg total) by mouth daily.  Marland Kitchen losartan (COZAAR) 100 MG tablet Take 1 tablet (100 mg total) by mouth daily.  . meclizine (ANTIVERT) 25 MG tablet Take 1 tablet (25 mg total) by mouth every 6 (six) hours as needed for dizziness.  . methenamine (HIPREX) 1 G tablet Take 500 mg by mouth 2 (two) times daily with a meal.   . methylcellulose (CITRUCEL) oral powder Take 1 tsp by mouth daily   . Multiple Vitamin (MULITIVITAMIN WITH MINERALS) TABS Take 1 tablet by mouth daily.  Vladimir Faster Glycol-Propyl Glycol (SYSTANE ULTRA PF) 0.4-0.3 % SOLN Place 2 drops into both eyes 2 (two) times daily.  . potassium chloride SA (K-DUR,KLOR-CON) 20 MEQ tablet take 1 tablet once daily  . sertraline (ZOLOFT) 50 MG tablet Take 1 tablet (50 mg total) by mouth daily.  . simvastatin (ZOCOR) 10 MG tablet Take 10 mg by mouth at bedtime.    Marland Kitchen warfarin (COUMADIN) 2.5 MG tablet Take by mouth as directed.   . [DISCONTINUED] Biotin (SUPER BIOTIN) 5 MG CAPS Take 1 capsule by mouth daily.    . [DISCONTINUED] Fluticasone-Salmeterol (ADVAIR DISKUS) 100-50 MCG/DOSE AEPB inhale 1 dose by mouth twice a day    Allergies  Allergen Reactions  . Metoclopramide Hcl Other (See Comments)    REACTION: causes uncontrolable leg movements  . Nitrofurantoin Nausea And Vomiting and Other (See Comments)     REACTION: dizziness    Current Medications, Allergies, Past Medical History, Past Surgical History, Family History, and Social History were reviewed in [employer] record.    Review of Systems    See HPI - all other systems neg except as noted...  The patient complains of dyspnea on exertion.  The patient denies anorexia, fever, weight loss, weight gain, vision loss, decreased hearing, hoarseness, chest pain, syncope, peripheral edema, prolonged cough, headaches, hemoptysis, abdominal pain, melena, hematochezia, severe indigestion/heartburn,  hematuria, incontinence, muscle weakness, suspicious skin lesions, transient blindness, difficulty walking, depression, unusual weight change, abnormal bleeding, enlarged lymph nodes, and angioedema.     Objective:   Physical Exam     WD, WN, 80 y/o WF in NAD GENERAL:  Alert & oriented; pleasant & cooperative... HEENT:  [place]/AT, EOM-wnl, PERRLA, EACs-clear, TMs-wnl, NOSE-clear, THROAT-clear & wnl. NECK:  Supple w/ decrROM; no JVD; normal carotid impulses w/o bruits; no thyromegaly or nodules palpated; no lymphadenopathy. CHEST:  Clear to P & A; without wheezes/ rales/ or rhonchi. HEART:  regular rhythm; without murmurs/ rubs/ or gallops. ABDOMEN:  soft & nontender; normal bowel sounds; no organomegaly or masses detected. EXT: without deformities, mild arthritic changes; no varicose veins/ venous insuffic/ or edema. NEURO:  CN's intact;  no focal neuro deficits... DERM:  No lesions noted; no rash etc...  RADIOLOGY DATA:  Reviewed in the EPIC EMR & discussed w/ the patient...  LABORATORY DATA:  Reviewed in the EPIC EMR & discussed w/ the patient...   Assessment & Plan:    ASTHMA>  Stable on Advair, Ventolin; she is improved s/p cardio-pulm REHAB at [facility]...  Hx DVT/ PTE/ Factor V defic>  Stable on Coumadin w/ protimes per DrFagan in [place]; continue same...  HBP>  Controlled on Cardizem, Losartan, Lasix; continue  same...  CHOL>  Stable on Simva + Fenofib and diet;  Continue same...  LargeHH/ GERD>  On NexiumBid now & followed by DrPatterson; continue same meds & vigorous antireflux program; he is in the process of further eval & referral to [facility]...  Divertics>  She had bout of acute diverticulitis 2/13 treated at [facility]; symptoms resolved & now on maintenance program...  Renal Insuffic, Hx of Neurogenic Bladder>  Stable on current meds & voiding satis w/o need to self cath, she still takes Mandelamine for UTI prevention...  DJD, Hyperuricemia, DDD, Osteopenia>  She remains on Allopurinol, calcium, MVI, Vit D...  Anemia>  Hg remains stable at 11-12 range...  Anxiety/ Depression>  On ZOLOFT 100mg  taking 1/2 Qhs...   Patient's Medications  New Prescriptions   AZITHROMYCIN (ZITHROMAX) 250 MG TABLET    Take as directed   FLUCONAZOLE (DIFLUCAN) 100 MG TABLET    2 today then 1 daily until gone   LEVOFLOXACIN (LEVAQUIN) 500 MG TABLET    Take 1 tablet (500 mg total) by mouth daily.   METHYLPREDNISOLONE (MEDROL DOSPACK) 4 MG TABLET    follow package directions   METHYLPREDNISOLONE (MEDROL) 4 MG TABLET    Take 1 tablet twice daily x 5 days, 1 tab daily x  5 days then 1/2 tab daily until gone  Previous Medications   ALBUTEROL (VENTOLIN HFA) 108 (90 BASE) MCG/ACT INHALER    Inhale 2 puffs into the lungs every 6 (six) hours as needed for wheezing.   ALLOPURINOL (ZYLOPRIM) 300 MG TABLET    Take 300 mg by mouth daily.     BIOTIN 5000 MCG CAPS    Take 1 capsule by mouth daily.   CALCIUM CITRATE-VITAMIN D (CITRACAL MAXIMUM) 315-250 MG-UNIT TABS    Take 1 capsule by mouth daily.   CHOLECALCIFEROL (VITAMIN D3) 2000 UNITS TABS    Take 1 tablet by mouth daily.     ESOMEPRAZOLE (NEXIUM) 40 MG CAPSULE    Take one tablet by mouth twice a day   FENOFIBRATE 160 MG TABLET    take 1 tablet once daily   MECLIZINE (ANTIVERT) 25 MG TABLET    Take 1 tablet (25  mg total) by mouth every 6 (six) hours as needed  for dizziness.   METHENAMINE (HIPREX) 1 G TABLET    Take 500 mg by mouth 2 (two) times daily with a meal.    METHYLCELLULOSE (CITRUCEL) ORAL POWDER    Take 1 tsp by mouth daily    MULTIPLE VITAMIN (MULITIVITAMIN WITH MINERALS) TABS    Take 1 tablet by mouth daily.   POLYETHYL GLYCOL-PROPYL GLYCOL (SYSTANE ULTRA PF) 0.4-0.3 % SOLN    Place 2 drops into both eyes 2 (two) times daily.   SIMVASTATIN (ZOCOR) 10 MG TABLET    Take 10 mg by mouth at bedtime.     WARFARIN (COUMADIN) 2.5 MG TABLET    Take by mouth as directed.   Modified Medications   Modified Medication Previous Medication   ADVAIR DISKUS 100-50 MCG/DOSE AEPB Fluticasone-Salmeterol (ADVAIR DISKUS) 100-50 MCG/DOSE AEPB      inhale 1 dose by mouth twice a day    inhale 1 dose by mouth twice a day   DILTIAZEM (CARDIZEM CD) 240 MG 24 HR CAPSULE diltiazem (CARDIZEM CD) 240 MG 24 hr capsule      Take 1 capsule (240 mg total) by mouth daily.    Take 1 capsule (240 mg total) by mouth daily.   FLUTICASONE (FLONASE) 50 MCG/ACT NASAL SPRAY fluticasone (FLONASE) 50 MCG/ACT nasal spray      instill 2 sprays into each nostril once daily    SPRAY 2 SPRAYS IN BOTH   NOSTRILS   FLUTICASONE-SALMETEROL (ADVAIR DISKUS) 100-50 MCG/DOSE AEPB ADVAIR DISKUS 100-50 MCG/DOSE AEPB      inhale 1 dose by mouth twice a day    inhale 1 dose by mouth twice a day   FUROSEMIDE (LASIX) 20 MG TABLET furosemide (LASIX) 20 MG tablet      Take 1 tablet (20 mg total) by mouth daily.    Take 1 tablet (20 mg total) by mouth daily.   LOSARTAN (COZAAR) 100 MG TABLET losartan (COZAAR) 100 MG tablet      take 1 tablet by mouth once daily    Take 1 tablet (100 mg total) by mouth daily.   POTASSIUM CHLORIDE SA (K-DUR,KLOR-CON) 20 MEQ TABLET potassium chloride SA (K-DUR,KLOR-CON) 20 MEQ tablet      take 1 tablet by mouth once daily    TAKE ONE TABLET BY MOUTH ONCE DAILY   SERTRALINE (ZOLOFT) 50 MG TABLET sertraline (ZOLOFT) 50 MG tablet      Take 1 tablet (50 mg total) by mouth  daily.    Take 1 tablet (50 mg total) by mouth daily.  Discontinued Medications   BIOTIN (SUPER BIOTIN) 5 MG CAPS    Take 1 capsule by mouth daily.     POTASSIUM CHLORIDE SA (K-DUR,KLOR-CON) 20 MEQ TABLET    take 1 tablet once daily

## 2013-05-29 NOTE — Patient Instructions (Signed)
Today we updated your med list in our EPIC system...    Continue your current medications the same...  Today we did your follow up CXR & FASTING blood work...    We will contact you w/ the results when available...   For your upper resp infection>>    We wrote new prescriptions for a ZPak & Medrol Dosepak- take as directed...  Call for any questions.Marland KitchenMarland Kitchen

## 2013-05-31 ENCOUNTER — Other Ambulatory Visit: Payer: Self-pay | Admitting: Pulmonary Disease

## 2013-06-05 ENCOUNTER — Telehealth: Payer: Self-pay | Admitting: Pulmonary Disease

## 2013-06-05 MED ORDER — METHYLPREDNISOLONE 4 MG PO TABS: ORAL_TABLET | ORAL | Status: DC

## 2013-06-05 MED ORDER — LEVOFLOXACIN 500 MG PO TABS: 500.0000 mg | ORAL_TABLET | Freq: Every day | ORAL | Status: DC

## 2013-06-05 NOTE — Telephone Encounter (Signed)
Per SN---  Needs stronger abx  levaquin 500 mg  #7  1 daily Medrol 4 mg  #20 1 bid x 5 days 1 daily x 5 days 1/2 tab daily until gone

## 2013-06-05 NOTE — Telephone Encounter (Signed)
Called and spoke with pt. She reports she is much worse this week. C/o prod cough, nasal cong, wheezing, chest tx, PND. She finished the abx and prednisone. Any further recs. Please advise SN thanks  Allergies  Allergen Reactions  . Metoclopramide Hcl Other (See Comments)    REACTION: causes uncontrolable leg movements  . Nitrofurantoin Nausea And Vomiting and Other (See Comments)    REACTION: dizziness    Current Outpatient Prescriptions on File Prior to Visit  Medication Sig Dispense Refill  . ADVAIR DISKUS 100-50 MCG/DOSE AEPB inhale 1 dose by mouth twice a day  60 each  6  . albuterol (VENTOLIN HFA) 108 (90 BASE) MCG/ACT inhaler Inhale 2 puffs into the lungs every 6 (six) hours as needed for wheezing.  1 Inhaler  2  . allopurinol (ZYLOPRIM) 300 MG tablet Take 300 mg by mouth daily.        Marland Kitchen azithromycin (ZITHROMAX) 250 MG tablet Take as directed  6 tablet  0  . Biotin 5000 MCG CAPS Take 1 capsule by mouth daily.      . Calcium Citrate-Vitamin D (CITRACAL MAXIMUM) 315-250 MG-UNIT TABS Take 1 capsule by mouth daily.      . Cholecalciferol (VITAMIN D3) 2000 UNITS TABS Take 1 tablet by mouth daily.        Marland Kitchen diltiazem (CARDIZEM CD) 240 MG 24 hr capsule Take 1 capsule (240 mg total) by mouth daily.  90 capsule  3  . esomeprazole (NEXIUM) 40 MG capsule Take one tablet by mouth twice a day      . fenofibrate 160 MG tablet take 1 tablet once daily  90 tablet  3  . fluticasone (FLONASE) 50 MCG/ACT nasal spray instill 2 sprays into each nostril once daily  16 g  5  . furosemide (LASIX) 20 MG tablet Take 1 tablet (20 mg total) by mouth daily.  90 tablet  1  . losartan (COZAAR) 100 MG tablet Take 1 tablet (100 mg total) by mouth daily.  90 tablet  3  . meclizine (ANTIVERT) 25 MG tablet Take 1 tablet (25 mg total) by mouth every 6 (six) hours as needed for dizziness.  30 tablet  11  . methenamine (HIPREX) 1 G tablet Take 500 mg by mouth 2 (two) times daily with a meal.       . methylcellulose  (CITRUCEL) oral powder Take 1 tsp by mouth daily       . methylPREDNIsolone (MEDROL DOSPACK) 4 MG tablet follow package directions  21 tablet  0  . Multiple Vitamin (MULITIVITAMIN WITH MINERALS) TABS Take 1 tablet by mouth daily.      Vladimir Faster Glycol-Propyl Glycol (SYSTANE ULTRA PF) 0.4-0.3 % SOLN Place 2 drops into both eyes 2 (two) times daily.      . potassium chloride SA (K-DUR,KLOR-CON) 20 MEQ tablet take 1 tablet once daily  90 tablet  0  . sertraline (ZOLOFT) 50 MG tablet Take 1 tablet (50 mg total) by mouth daily.  90 tablet  1  . simvastatin (ZOCOR) 10 MG tablet Take 10 mg by mouth at bedtime.        Marland Kitchen warfarin (COUMADIN) 2.5 MG tablet Take by mouth as directed.        No current facility-administered medications on file prior to visit.

## 2013-06-05 NOTE — Telephone Encounter (Signed)
i called and spoke with pt. Aware of recs,. rx has been sent

## 2013-06-15 ENCOUNTER — Telehealth: Payer: Self-pay | Admitting: Pulmonary Disease

## 2013-06-15 MED ORDER — FLUCONAZOLE 100 MG PO TABS: ORAL_TABLET | ORAL | Status: DC

## 2013-06-15 NOTE — Telephone Encounter (Signed)
Called and spoke with pt and she is aware of SN recs and that this medication has been sent to her pharmacy.  Nothing further is needed.

## 2013-06-15 NOTE — Telephone Encounter (Signed)
Spoke with the pt She c/o sore mouth/tongue with white patches for the past 2 days  She recently took 2 rounds of abx and believes that she has thrush  Would like something called in  Please advise thanks! (pt aware of SN's retirement)  Allergies  Allergen Reactions  . Metoclopramide Hcl Other (See Comments)    REACTION: causes uncontrolable leg movements  . Nitrofurantoin Nausea And Vomiting and Other (See Comments)    REACTION: dizziness

## 2013-06-15 NOTE — Telephone Encounter (Signed)
Per SN---  dilflucan 100 mg  #6  2 po today then 1 daily until gone.

## 2013-06-21 ENCOUNTER — Other Ambulatory Visit: Payer: Self-pay | Admitting: Pulmonary Disease

## 2013-06-27 ENCOUNTER — Telehealth: Payer: Self-pay | Admitting: Pulmonary Disease

## 2013-06-27 MED ORDER — DILTIAZEM HCL ER COATED BEADS 240 MG PO CP24: 240.0000 mg | ORAL_CAPSULE | Freq: Every day | ORAL | Status: DC

## 2013-06-27 NOTE — Telephone Encounter (Signed)
Pt aware RX has been called in. Nothing further needed 

## 2013-07-05 ENCOUNTER — Other Ambulatory Visit: Payer: Self-pay | Admitting: Pulmonary Disease

## 2013-07-19 ENCOUNTER — Telehealth: Payer: Self-pay | Admitting: Pulmonary Disease

## 2013-07-19 MED ORDER — FUROSEMIDE 20 MG PO TABS: 20.0000 mg | ORAL_TABLET | Freq: Every day | ORAL | Status: DC

## 2013-07-19 NOTE — Telephone Encounter (Signed)
Rx has been sent in. Pt is aware. Nothing further is needed. 

## 2013-08-02 ENCOUNTER — Telehealth: Payer: Self-pay | Admitting: Pulmonary Disease

## 2013-08-02 NOTE — Telephone Encounter (Signed)
Spoke with pt and she states that Dr Asencion Noble has agreed to take over as her PCP.  She states that she needs records sent from Dr Lenna Gilford.  Recent ov, labs and med list sent.

## 2013-08-10 ENCOUNTER — Other Ambulatory Visit: Payer: Self-pay | Admitting: Pulmonary Disease

## 2013-09-08 ENCOUNTER — Other Ambulatory Visit: Payer: Self-pay | Admitting: Pulmonary Disease

## 2013-09-08 ENCOUNTER — Telehealth: Payer: Self-pay | Admitting: Pulmonary Disease

## 2013-09-08 MED ORDER — FLUTICASONE-SALMETEROL 100-50 MCG/DOSE IN AEPB: INHALATION_SPRAY | RESPIRATORY_TRACT | Status: DC

## 2013-09-08 NOTE — Telephone Encounter (Signed)
Pt aware RX has been sent. Nothing further needed 

## 2013-09-29 ENCOUNTER — Other Ambulatory Visit: Payer: Self-pay | Admitting: Pulmonary Disease

## 2013-11-23 ENCOUNTER — Other Ambulatory Visit: Payer: Self-pay | Admitting: Pulmonary Disease

## 2013-11-29 ENCOUNTER — Encounter: Payer: Self-pay | Admitting: Pulmonary Disease

## 2013-11-29 ENCOUNTER — Ambulatory Visit (INDEPENDENT_AMBULATORY_CARE_PROVIDER_SITE_OTHER): Payer: Medicare Other | Admitting: Pulmonary Disease

## 2013-11-29 VITALS — BP 120/60 | HR 77 | Temp 96.9°F | Ht 60.0 in | Wt 142.0 lb

## 2013-11-29 DIAGNOSIS — D682 Hereditary deficiency of other clotting factors: Secondary | ICD-10-CM

## 2013-11-29 DIAGNOSIS — Z7901 Long term (current) use of anticoagulants: Secondary | ICD-10-CM

## 2013-11-29 DIAGNOSIS — Z86718 Personal history of other venous thrombosis and embolism: Secondary | ICD-10-CM

## 2013-11-29 DIAGNOSIS — K219 Gastro-esophageal reflux disease without esophagitis: Secondary | ICD-10-CM

## 2013-11-29 DIAGNOSIS — K449 Diaphragmatic hernia without obstruction or gangrene: Secondary | ICD-10-CM

## 2013-11-29 DIAGNOSIS — J45909 Unspecified asthma, uncomplicated: Secondary | ICD-10-CM

## 2013-11-29 DIAGNOSIS — Z8672 Personal history of thrombophlebitis: Secondary | ICD-10-CM

## 2013-11-29 NOTE — Patient Instructions (Signed)
Today we updated your med list in our EPIC system...    Continue your current medications the same...  Call for any problems or if we can be of service in any way...  Let's plan a follow up visit in 63yr.Marland KitchenMarland Kitchen

## 2013-11-29 NOTE — Progress Notes (Signed)
Subjective:    Patient ID: Nancy Hicks, female    DOB: 1926-06-12, 80 y.o.   MRN: [id number]  HPI 80 y/o WF here for a follow up visit... she has multiple medical problems as noted below & is followed both here and in [place] by DrFagin...   ~  November 04, 2010:  51mo ROV & she reports doing well in the interval- no new complaints or concerns;  Her LMD is DrFagin in [place] & she gets her Protimes monitored thru his office;  She had full labs here 2/12 & all wnl x Hg=11.4.Marland KitchenMarland Kitchen    Asthma well controlled on Advair Bid & Ventolin Prn; denies cough, phlegm, SOB (stable DOE w/o change), swelling;  Hx DVT/ PE and Factor V defic on Coumadin monitored thru DrFagin's office...    She reports BP & lipids well controlled on Cardizem, Cozaar, Lasix & KCl; along w/ Simva10 & Fenofibrate160; BP= 134/76 and feeling well> denies HA, fatigue, visual changes, CP, palipit, dizziness, syncope, edema, etc...    She has a large HH, GERD & stable on her Nexium; she reports interval bouts of dudenitis treated by DrFagin...    See prob list below >>  ~  May 04, 2011:  23mo ROV & she reports a quiet interval, no new complaints or concerns;  She sees DrFagin in [place] for primary care & protimes (on coumadin for hx PE/ DVT & Factor V defic);  We follow her Asthma> stable on Advair100 and VentolinHFA rescue inhaler (she has NEVER needed to use this she says)...    BP controlled on diltiazem, Losartan, Lasix> 140/60 today & she denies CP, palpit, ch in DOE, edema, etc...    Chol controlled on diet + Simva & Feno> FLP today looks good (see below)...    She has a large HH seen on CXRs & hx severe GERD followed by drPatterson but she reports well controlled now on Nexium 40mg /d...    She also had stable mild Renalinsuffic w/ BUN=38, Creat=1.5 & reminded to stay well hydrated; prev hx of neurogenic bladder & used to have to do self caths but this has resolved itself 7 she reports continued normal voiding  regimen...  ~  November 02, 2011:  71mo ROV & Nancy Hicks was adm to [facility] 2/13 by [facility] for Diverticulitis confirmed on CT Abd, treated w/ Cipro & Flagyl & improved... She had followed up w/ DrPatterson for her [facility] & intermittent solid food dysphagia ==> see eval below w/ BaSwallow, EsophManometry, ?24H pH probe, & further eval rec at [facility]...  She also c/o DOE w/ some ADLs like housework, etc; we reviewed exercise prescription & she is too sedentary==> try Cardio-Pulm REHAB...    We reviewed prob list, meds, xrays and labs> see below for updates >>  ~  May 09, 2012:  21mo ROV & Nancy Hicks is doing reasonably well at 46; We reviewed the following medical problems during today's office visit >>     Asthma> stable on Advair100 and VentolinHFA rescue inhaler (hasn't needed); she says Pulm Rehab reallly helped, now breathing easily w/o cough, phlegm, hemoptysis, SOB, wheezing, etc; doing her own exercises at home...    BP controlled on Diltiazem240, Losartan50, Lasix20> 130/70 today & she denies CP, palpit, ch in DOE, edema, etc...    Hx PE/ DVT> still on Coumadin followed by DrFagin in [place]...    Chol controlled on Simva10 & Feno160> FLP shows TChol 140, TG 125, HDL 44, LDL 71    She has a  large HH seen on CXRs & hx severe GERD followed by DrPatterson & referred to [facility] but she reports improved now on Nexium 40Bid...    She was adm to [facility] 2/13 w/ diverticulitis & responded to Cipro/ Flagyl; now on Miralax & Levsin 0.125mg  prn...    She also had stable mild Renalinsuffic w/ BUN=38, Creat=1.4 & reminded to stay well hydrated; prev hx of neurogenic bladder & used to have to do self caths but this has resolved itself & she reports continued normal voiding regimen; on Methenamine 1/2Bid for prevention... We reviewed prob list, meds, xrays and labs> see below for updates >>   LABS 2/14:  FLP- at goals on simva10+Feno160;  Chems- ok w/ Creat=1.4;  CBC- ok w/ Hg=11.5;  TSH= 1.56...   ~  November 21, 2012:  36mo ROV & Nancy Hicks is stable x for some intermit dizziness (?assoc w/ recent diarrhea) & we discussed incr fluids & trial of prn Meclizine;  We reviewed the following medical problems during today's office visit >>     Asthma> on Advair100Bid & Proair prn; breathing is good w/o recent exac; also uses OTC Antihist & Flonase as needed for nasal symptoms...    HxPE/ DVT/ FactorV deficiency> on Coumadin via [place] [facility] & doing satis by her report; she denies leg pain, swelling, etc...     HBP> on Cardizem240, Cozaar100, Lasix40-1/2, & K20; BP= 142/84 & occas higher at home; may need addition of BBlocker but holding off for now; denies CP, palpit, ch in SOB or edema...    CHOL> on Simva10 + Feno160; FLP 2/14 showed TChol 140, TG 125, HDL 44, LDL 71    Hx severe GERD> hx largeHH & severe GERD, followed by DrPatterson & eval at [facility] as well- symptoms improved on Nexium40Bid    Divertics> she had acute diverticulitis 2/13 treated by DrFagin in [place]; doing better since then & denies abd pain, n/v, c/d, blood seen...    Renal Insuffic> Cr in the 1.4 to 1.5 range & stable...    GU- Neurogenic bladder> followed by Urology; on Methenamine500Bid to prevent bladder infections & improved; she used to self cath but has been voiding satis recently...    DJD, hyperuricemia, Osteopenia> Hx Cspine dis w/ myelopathy, followed by [facility] & improved; on Allopurinol300 w/ Uric= 3-4 range on this...    Anxiety> on Zoloft100-1/2 daily...    Anemia> Hg stable in the 11 to 12 range... We reviewed prob list, meds, xrays and labs> see below for updates >>   ~  May 29, 2013:  62mo ROV & Nancy Hicks is c/o some sinusitis, left ear discomfort, low grade temp, and feeling rundown;  She is blowing out some yellow mucus, no blood, & has left sided sinus HA;  We discussed Rx w/ ZPak, Flonase, & Medrol Dosepak for relief, along w/ Mucinex, Saline, & fluids...  We reviewed the following medical problems during  today's office visit >>     Asthma> on Advair100Bid & Proair prn; breathing is good w/o recent exac; also uses OTC Antihist & Flonase as needed for nasal symptoms...    HxPE/ DVT/ FactorV deficiency> on Coumadin via  coumadin clinic & doing satis by her report; she denies leg pain, swelling, etc...     HBP> on Cardizem240, Cozaar100, Lasix40-1/2, & K20; BP= 120/70 & occas higher at home; denies CP, palpit, ch in SOB or edema...    CHOL> on Simva10 + Feno160; FLP 3/15 showed TChol 153, TG 114, HDL 49,  LDL 81    Hx severe GERD> hx largeHH & severe GERD, followed by DrPatterson & eval at [facility] as well- symptoms improved on Nexium40Bid    Divertics> on Citrucel daily; she had acute diverticulitis 2/13 treated by DrFagin in [place]; doing better since then & denies abd pain, n/v, c/d, blood seen...    Renal Insuffic> hx mild RI w/ BUN=35, Cr= 1.6    GU- Neurogenic bladder> followed by Urology; on Methenamine500Bid to prevent bladder infections & improved; she used to self cath but has been voiding satis recently...    DJD, hyperuricemia, Osteopenia> Hx Cspine dis w/ myelopathy, followed by [facility] & improved; on Allopurinol300 w/ Uric= 3-4 range on this...    Anxiety> on Zoloft100-1/2 daily...    Anemia> Hg stable in the 11 to 12 range... We reviewed prob list, meds, xrays and labs> see below for updates >>   CXR 3/15 showed norm heart size, largeHH, scoliosis, clear lungs & NAD...  LABS 3/15:  FLP at goals on Simva10 & Feno160;  Chems- ok x mild RI w/ BUN=35, Cr=1.6;  CBC- ok x Hg=11.9;  TSH=1.39  ~  November 29, 2013:  20mo ROV & Nancy Hicks reports a good interval after being treated for AB 3/15; she has maintained on her ADVAIR100Bid & has AlbutHFA for prn use;  She denies much cough, sputum, dyspnea, etc;  [place] too [place] 7 [place], no facilities nearby etc;  Asked to consider silver sneakers vs home exercise program, etc;  As  noted she has a large HH & symptoms cntrolled w/ Nexium40Bid, antireflux regimen, elev HOB, NPO after dinner, etc;  She continues to f/u w/ DrFagin in [place] on Coumadin w/ Hx DVT & PE- stable;  Hx HBP, Hyperlipidemia, renal insuffic & neurogenic bladder, other issues as noted> all stable on meds w/ labs per DrFagin...  We reviewed prob list, meds, xrays and labs> see below for updates >>            Problem List:   she had PNEUMOVAX 2004, & gets yearly Flu shots each Fall... TDAP given 8/11.  ASTHMA (ICD-493.90) - on ADVAIR 100Bid & VentolinHFA Prn... prev required O2 and Medrol but not in some time now- doing well- stable without cough, sputum, SOB, wheezing, CP, etc... prev on allergy vaccine & she stopped it... she has noted incr dyspnea w/ lack of exercise & improved back on exerc program. ~  NOTE:  large HH seen on CXR, & takes Nexium Tid... ~  CXR 2/11 showed large HH, DJD spine, clear lungs, NAD.Marland Kitchen. ~  CXR 2/13 showed large HH, DJD/ scoliosis of spine, NAD.Marland Kitchen. ~  8/13:  She is too sedentary & notes some DOE w/ ADLs; rec to enter cardio-pulm rehab program... ~  2/14: she reports that [place] has really helped; now doing her own exercises at home... ~  8/14: on Advair100Bid & Proair prn; breathing is good w/o recent exac; also uses OTC Antihist & Flonase as needed for nasal symptoms. ~  9/15: on Advair100Bid & AlbutHFA rescue inhaler prn; she reports a good 105mo interval w/o recurrent exac etc...  PULMONARY EMBOLISM, HX OF (ICD-V12.51) & FACTOR V DEFICIENCY (ICD-286.3) - last hosp in 9/06 for R heart cath to r/o pulmHTN & normal pressures were found... DEEP VENOUS THROMBOPHLEBITIS, HX OF (ICD-V12.52) - as above, & she wears TED hose, no edema... ~  DVT in right leg and PTE on CTAngio in 4/05 and  treated w/ Hep/ COUMADIN (protimes followed in [place] by DrFagan)...   HYPERTENSION (ICD-401.9) - on DILTIAZEM 240mg /d, LOSARTAN 100mg /d, LASIX 40mg - 1/2 tab daily, & K20/d... ~  2DEcho 6/06  showed normal LV wall thicknees & LVF, ? mild pulm HTN w/ RVsys est 30-40 ~  8/12:  BP= 134/76 & she denies CP, palpit, dizzy, SOB, edema... ~  2/13:  BP= 140/60 & she remains asymptomatic... ~  2/14: on Diltiazem240, Losartan50, Lasix20> BP= 130/70 today & she denies CP, palpit, ch in DOE, edema, etc ~  8/14: on Cardizem240, Cozaar100, Lasix40-1/2, & K20; BP= 142/84 & occas higher at home; may needs addition of BBlocker but holding off for now; denies CP, palpit, ch in SOB or edema. ~  9/15: on same meds w/ BP= 120/60 & she denies CP, palpit, SOB, dizzy, edema...  HYPERCHOLESTEROLEMIA (ICD-272.0) - on SIMVASTATIN 10mg /d now & FENOFIBRATE 160mg /d... ~  FLP 6/08 (on Lescol & Tricor) showed TChol 165, TG 94, HDL 49, LDL 97... rec- contin same. ~  [place] 7/09 showed TChol 152, TG 123, HDL 45, LDL 83... rec- change to Simva40 + Fenofib160 for $$. ~  FLP 7/10 showed TChol 160, TG 107, HDL 51, LDL 88... continue same. ~  [place] 8/11 (on Simva10+Feno160) showed TChol 154, TG 102, HDL 55, LDL 79... stable, same Rx. ~  [place] 2/12 (on Simva10+Feno160) showed TChol 152, TG 118, HDL 48, LDL 80 ~  FLP 2/13 (on Simva10+Feno160) showed TChol 172, TG 123, HDL 52, LDL 95 ~  FLP 2/14 on Simva10+Feno160 showed TChol 140, TG 125, HDL 44, LDL 71   GASTROESOPHAGEAL REFLUX DISEASE, SEVERE (ICD-530.81) - she has a large HH seen on routine CXRs; severe GERD followed by DrPatterson; on Bunn 40mg - recent decr to 1/d... c/o gas & encouraged to take GasX, Mylicon, BeanO. ~  last EGD 6/06 showed large HH & stricture dilated... ~  GI eval for incr dysphagia 1/12 w/ EGD by DrPatterson showing large prolapsing HH, candida esoph, erosions & polyps> treated w/ Diflucan, Nexium decr to once daily. ~  She reports stable on Nexium 40mg  /d... ~  2013: She's had f/u w/ DrPatterson> chr GERD, intermit solid food dysphagia; he rec Ba Swallow, Manometry & 24H pH probe, and appt at [facility]... ~  7/13:  Ba Swallow showed web-like narrowing in cerv  esoph, presbyesoph, tortuous mid-distal esoph, largeHH, spasm distally, 50mm Ba tablet temporarily lodged in mid-esoph... ~  2/14: she reports much improved on Nexium40Bid...  DIVERTICULOSIS, COLON (ICD-562.10) - on LEVSIN 0.125mg  tid prn... ~  colonoscopy 10/00 showed divertics, no polyps. ~  GI f/u by [facility] 10/09- note reviewed... on CITRUCEL daily, & rec to take Phazyme for Gas. ~  [facility] 2/13 at [facility] by [facility] for Diverticulitis, treated w/ Cipro/ Flagyl & improved... ~  CT Abd&Pelvis 2/13 showed [place], s/p GB, kidney cysts, acute diverticulitis in prox sigmoid (Rx by DrFagin in [place]) & no complicating features...  RENAL INSUFFICIENCY (ICD-588.9) ~  labs 6/08 showed BUN= 43, Creat= 1.7 ~  labs 7/09 showed BUN= 35, Creat= 1.7 ~  labs 7/10 showed BUN= 51, Creat= 1.8 ~  labs 9/10 showed BUN= 33, Creat= 1.4 ~  labs 2/11 showed BUN= 32, Creat= 2.1, K= 4.6 ~  labs 8/11 showed BUN= 34, Creat= 1.5, K= 4.7 ~  labs 2/12 showed BUN= 37, creat= 1.5, K= 4.9 ~  Labs 2/13 showed BUN= 38, Creat= 1.5, K= 4.3 ~  Labs 2/14 showed BUN= 38, Creat= 1.4, K= 4.4  NEUROGENIC BLADDER (ICD-596.54) -  eval and Rx by DrKimbrough in the past... prev had to self-cath regularly, & now voids satis on her own... still on MANDELAMINE for UTI prophylaxis per Urology.  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 also takes ALLOPURINOL 300mg /d for hyperuricemia... ~  labs 7/10 showed Uric= 4.3.Marland Kitchen. rec> continue Allopurinol. ~  labs 8/11 showed Uric= 3.8  OSTEOPENIA (ICD-733.90) - BMD from GYN= DrNeal w/ osteopenia Rx'd w/ ca++, MVI, VitD... ~  7/10: pt reports that DrNeal did VitD level & started her on Vit D 2000 u daily... ~  labs 2/11 showed Vit D level = 54... ~  Labs 2/13 showed Vit D level = 48... Continue supplement.  ANXIETY (ICD-300.00) - on ZOLOFT 100mg - 1/2 tab  Qhs...  ANEMIA (ICD-285.9) - hx of intol to all  P O forms of iron therapy... ~  labs 6/08 showed Hg= 11.1.Marland Kitchen. ~  labs 7/09 showed Hg= 10.3.Marland KitchenMarland Kitchen DrPatterson checked labs 10/09 w/ Fe= 77, Ferritin= 26, B12= 382. ~  labs 7/10 showed Hg= 11.0 ~  labs 2/11 showed Hg= 11.6 ~  labs 8/11 showed Hg= 11.9 ~  labs 2/12 showed Hg= 11.4 ~  Labs 2/13 showed Hg= 12.4, MCV= 96 ~  Labs 2/14 showed Hg= 11.5   Past Surgical History  Procedure Laterality Date  . Cholecystectomy    . Salivery glad tumor      right  . Tonsillectomy    . Esophageal manometry  10/26/2011    Procedure: ESOPHAGEAL MANOMETRY (EM);  Surgeon: Sable Feil, MD;  Location: [facility];  Service: Endoscopy;  Laterality: N/A;    Outpatient Encounter Prescriptions as of 11/29/2013  Medication Sig  . ADVAIR DISKUS 100-50 MCG/DOSE AEPB inhale 1 dose by mouth twice a day  . albuterol (VENTOLIN HFA) 108 (90 BASE) MCG/ACT inhaler Inhale 2 puffs into the lungs every 6 (six) hours as needed for wheezing.  Marland Kitchen allopurinol (ZYLOPRIM) 300 MG tablet Take 300 mg by mouth daily.    . Biotin 5000 MCG CAPS Take 1 capsule by mouth daily.  . Calcium Citrate-Vitamin D (CITRACAL MAXIMUM) 315-250 MG-UNIT TABS Take 1 capsule by mouth daily.  . Cholecalciferol (VITAMIN D3) 2000 UNITS TABS Take 1 tablet by mouth daily.    Marland Kitchen diltiazem (CARDIZEM CD) 240 MG 24 hr capsule Take 1 capsule (240 mg total) by mouth daily.  Marland Kitchen esomeprazole (NEXIUM) 40 MG capsule Take one tablet by mouth twice a day  . fenofibrate 160 MG tablet take 1 tablet once daily  . fluticasone (FLONASE) 50 MCG/ACT nasal spray instill 2 sprays into each nostril once daily  . furosemide (LASIX) 20 MG tablet Take 1 tablet (20 mg total) by mouth daily.  Marland Kitchen losartan (COZAAR) 100 MG tablet take 1 tablet by mouth once daily  . meclizine (ANTIVERT) 25 MG tablet Take 1 tablet (25 mg total) by mouth every 6 (six) hours as needed for dizziness.  . methenamine (HIPREX) 1 G tablet Take 500 mg by mouth 2  (two) times daily with a meal.   . methylcellulose (CITRUCEL) oral powder Take 1 tsp by mouth daily   . Multiple Vitamin (MULITIVITAMIN WITH MINERALS) TABS Take 1 tablet by mouth daily.  Vladimir Faster Glycol-Propyl Glycol (SYSTANE ULTRA PF) 0.4-0.3 % SOLN Place 2 drops into both eyes 2 (two) times daily.  . potassium chloride SA (K-DUR,KLOR-CON) 20 MEQ tablet take 1 tablet by mouth once daily  . sertraline (ZOLOFT)  50 MG tablet Take 1 tablet (50 mg total) by mouth daily.  . simvastatin (ZOCOR) 10 MG tablet Take 10 mg by mouth at bedtime.    Marland Kitchen warfarin (COUMADIN) 2.5 MG tablet Take by mouth as directed.   . [DISCONTINUED] azithromycin (ZITHROMAX) 250 MG tablet Take as directed  . [DISCONTINUED] fluconazole (DIFLUCAN) 100 MG tablet 2 today then 1 daily until gone  . [DISCONTINUED] Fluticasone-Salmeterol (ADVAIR DISKUS) 100-50 MCG/DOSE AEPB inhale 1 dose by mouth twice a day  . [DISCONTINUED] levofloxacin (LEVAQUIN) 500 MG tablet Take 1 tablet (500 mg total) by mouth daily.  . [DISCONTINUED] methylPREDNIsolone (MEDROL DOSPACK) 4 MG tablet follow package directions  . [DISCONTINUED] methylPREDNISolone (MEDROL) 4 MG tablet Take 1 tablet twice daily x 5 days, 1 tab daily x  5 days then 1/2 tab daily until gone    Allergies  Allergen Reactions  . Metoclopramide Hcl Other (See Comments)    REACTION: causes uncontrolable leg movements  . Nitrofurantoin Nausea And Vomiting and Other (See Comments)    REACTION: dizziness    Current Medications, Allergies, Past Medical History, Past Surgical History, Family History, and Social History were reviewed in [employer] record.    Review of Systems    See HPI - all other systems neg except as noted...  The patient complains of dyspnea on exertion.  The patient denies anorexia, fever, weight loss, weight gain, vision loss, decreased hearing, hoarseness, chest pain, syncope, peripheral edema, prolonged cough, headaches, hemoptysis,  abdominal pain, melena, hematochezia, severe indigestion/heartburn, hematuria, incontinence, muscle weakness, suspicious skin lesions, transient blindness, difficulty walking, depression, unusual weight change, abnormal bleeding, enlarged lymph nodes, and angioedema.     Objective:   Physical Exam     WD, WN, 80 y/o WF in NAD GENERAL:  Alert & oriented; pleasant & cooperative... HEENT:  [place]/AT, EOM-wnl, PERRLA, EACs-clear, TMs-wnl, NOSE-clear, THROAT-clear & wnl. NECK:  Supple w/ decrROM; no JVD; normal carotid impulses w/o bruits; no thyromegaly or nodules palpated; no lymphadenopathy. CHEST:  Clear to P & A; without wheezes/ rales/ or rhonchi. HEART:  regular rhythm; without murmurs/ rubs/ or gallops. ABDOMEN:  soft & nontender; normal bowel sounds; no organomegaly or masses detected. EXT: without deformities, mild arthritic changes; no varicose veins/ venous insuffic/ or edema. NEURO:  CN's intact;  no focal neuro deficits... DERM:  No lesions noted; no rash etc...  RADIOLOGY DATA:  Reviewed in the EPIC EMR & discussed w/ the patient...  LABORATORY DATA:  Reviewed in the EPIC EMR & discussed w/ the patient...   Assessment & Plan:    ASTHMA>  Stable on Advair, Ventolin; she is improved s/p cardio-pulm REHAB at [facility]... Encouraged to  Maintain exercise program on her own...  Hx DVT/ PTE/ Factor V defic>  Stable on Coumadin w/ protimes per DrFagan in [place]; continue same...  HBP>  Controlled on Cardizem, Losartan, Lasix; continue same...  CHOL>  Stable on Simva + Fenofib and diet;  Continue same...  LargeHH/ GERD>  On NexiumBid now & followed by DrPatterson; continue same meds & vigorous antireflux program; he is in the process of further eval & referral to [facility]...  Divertics>  She had bout of acute diverticulitis 2/13 treated at [facility]; symptoms resolved & now on maintenance program...  Renal Insuffic, Hx of Neurogenic Bladder>  Stable on current meds &  voiding satis w/o need to self cath, she still takes Mandelamine for UTI prevention...  DJD, Hyperuricemia, DDD, Osteopenia>  She remains on Allopurinol,  calcium, MVI, Vit D...  Anemia>  Hg remains stable at 11-12 range...  Anxiety/ Depression>  On ZOLOFT 100mg  taking 1/2 Qhs...   Patient's Medications  New Prescriptions   No medications on file  Previous Medications   ADVAIR DISKUS 100-50 MCG/DOSE AEPB    inhale 1 dose by mouth twice a day   ALBUTEROL (VENTOLIN HFA) 108 (90 BASE) MCG/ACT INHALER    Inhale 2 puffs into the lungs every 6 (six) hours as needed for wheezing.   ALLOPURINOL (ZYLOPRIM) 300 MG TABLET    Take 300 mg by mouth daily.     BIOTIN 5000 MCG CAPS    Take 1 capsule by mouth daily.   CALCIUM CITRATE-VITAMIN D (CITRACAL MAXIMUM) 315-250 MG-UNIT TABS    Take 1 capsule by mouth daily.   CHOLECALCIFEROL (VITAMIN D3) 2000 UNITS TABS    Take 1 tablet by mouth daily.     DILTIAZEM (CARDIZEM CD) 240 MG 24 HR CAPSULE    Take 1 capsule (240 mg total) by mouth daily.   ESOMEPRAZOLE (NEXIUM) 40 MG CAPSULE    Take one tablet by mouth twice a day   FENOFIBRATE 160 MG TABLET    take 1 tablet once daily   FLUTICASONE (FLONASE) 50 MCG/ACT NASAL SPRAY    instill 2 sprays into each nostril once daily   FUROSEMIDE (LASIX) 20 MG TABLET    Take 1 tablet (20 mg total) by mouth daily.   LOSARTAN (COZAAR) 100 MG TABLET    take 1 tablet by mouth once daily   MECLIZINE (ANTIVERT) 25 MG TABLET    Take 1 tablet (25 mg total) by mouth every 6 (six) hours as needed for dizziness.   METHENAMINE (HIPREX) 1 G TABLET    Take 500 mg by mouth 2 (two) times daily with a meal.    METHYLCELLULOSE (CITRUCEL) ORAL POWDER    Take 1 tsp by mouth daily    MULTIPLE VITAMIN (MULITIVITAMIN WITH MINERALS) TABS    Take 1 tablet by mouth daily.   POLYETHYL GLYCOL-PROPYL GLYCOL (SYSTANE ULTRA PF) 0.4-0.3 % SOLN    Place 2 drops into both eyes 2 (two) times daily.   POTASSIUM CHLORIDE SA (K-DUR,KLOR-CON) 20 MEQ TABLET     take 1 tablet by mouth once daily   SERTRALINE (ZOLOFT) 50 MG TABLET    Take 1 tablet (50 mg total) by mouth daily.   SIMVASTATIN (ZOCOR) 10 MG TABLET    Take 10 mg by mouth at bedtime.     WARFARIN (COUMADIN) 2.5 MG TABLET    Take by mouth as directed.   Modified Medications   No medications on file  Discontinued Medications   AZITHROMYCIN (ZITHROMAX) 250 MG TABLET    Take as directed   FLUCONAZOLE (DIFLUCAN) 100 MG TABLET    2 today then 1 daily until gone   FLUTICASONE-SALMETEROL (ADVAIR DISKUS) 100-50 MCG/DOSE AEPB    inhale 1 dose by mouth twice a day   LEVOFLOXACIN (LEVAQUIN) 500 MG TABLET    Take 1 tablet (500 mg total) by mouth daily.   METHYLPREDNISOLONE (MEDROL DOSPACK) 4 MG TABLET    follow package directions   METHYLPREDNISOLONE (MEDROL) 4 MG TABLET    Take 1 tablet twice daily x 5 days, 1 tab daily x  5 days then 1/2 tab daily until gone

## 2013-12-13 ENCOUNTER — Other Ambulatory Visit: Payer: Self-pay | Admitting: Obstetrics & Gynecology

## 2013-12-13 DIAGNOSIS — R921 Mammographic calcification found on diagnostic imaging of breast: Secondary | ICD-10-CM

## 2013-12-22 ENCOUNTER — Ambulatory Visit
Admission: RE | Admit: 2013-12-22 | Discharge: 2013-12-22 | Disposition: A | Discharge status: Home / Self Care | Payer: Medicare Other | Source: Ambulatory Visit | Attending: Obstetrics & Gynecology | Admitting: Obstetrics & Gynecology

## 2013-12-22 DIAGNOSIS — R921 Mammographic calcification found on diagnostic imaging of breast: Secondary | ICD-10-CM

## 2014-03-30 DIAGNOSIS — C8599 Non-Hodgkin lymphoma, unspecified, extranodal and solid organ sites: Secondary | ICD-10-CM

## 2014-03-30 HISTORY — DX: Non-Hodgkin lymphoma, unspecified, extranodal and solid organ sites: C85.99

## 2014-04-17 ENCOUNTER — Other Ambulatory Visit: Payer: Self-pay | Admitting: Pulmonary Disease

## 2014-04-18 ENCOUNTER — Telehealth: Payer: Self-pay | Admitting: Pulmonary Disease

## 2014-04-18 MED ORDER — FLUTICASONE-SALMETEROL 100-50 MCG/DOSE IN AEPB: INHALATION_SPRAY | RESPIRATORY_TRACT | Status: DC

## 2014-04-18 NOTE — Telephone Encounter (Signed)
Pt is current on visit with SN and aware that I have sent refill request to [facility]. Nothing more needed at this time.

## 2014-05-08 ENCOUNTER — Other Ambulatory Visit (HOSPITAL_COMMUNITY)
Admission: RE | Admit: 2014-05-08 | Discharge: 2014-05-08 | Disposition: A | Discharge status: Home / Self Care | Payer: 59 | Source: Ambulatory Visit | Attending: Internal Medicine | Admitting: Internal Medicine

## 2014-05-08 DIAGNOSIS — Z7901 Long term (current) use of anticoagulants: Secondary | ICD-10-CM | POA: Insufficient documentation

## 2014-05-08 LAB — PROTIME-INR
INR: 2.01 — ABNORMAL HIGH (ref 0.00–1.49)
Prothrombin Time: 23 seconds — ABNORMAL HIGH (ref 11.6–15.2)

## 2014-06-20 ENCOUNTER — Other Ambulatory Visit: Payer: Self-pay | Admitting: Pulmonary Disease

## 2014-08-30 ENCOUNTER — Other Ambulatory Visit (INDEPENDENT_AMBULATORY_CARE_PROVIDER_SITE_OTHER): Payer: Self-pay | Admitting: Otolaryngology

## 2014-08-30 DIAGNOSIS — R221 Localized swelling, mass and lump, neck: Secondary | ICD-10-CM

## 2014-09-05 ENCOUNTER — Ambulatory Visit (HOSPITAL_COMMUNITY)
Admission: RE | Admit: 2014-09-05 | Discharge: 2014-09-05 | Disposition: A | Discharge status: Home / Self Care | Payer: Medicare Other | Source: Ambulatory Visit | Attending: Otolaryngology | Admitting: Otolaryngology

## 2014-09-05 ENCOUNTER — Other Ambulatory Visit (INDEPENDENT_AMBULATORY_CARE_PROVIDER_SITE_OTHER): Payer: Self-pay | Admitting: Otolaryngology

## 2014-09-05 DIAGNOSIS — R221 Localized swelling, mass and lump, neck: Secondary | ICD-10-CM | POA: Diagnosis present

## 2014-09-05 LAB — POCT I-STAT CREATININE: Creatinine, Ser: 1.6 mg/dL — ABNORMAL HIGH (ref 0.44–1.00)

## 2014-09-05 MED ORDER — IOHEXOL 300 MG/ML  SOLN: 100.0000 mL | Freq: Once | INTRAMUSCULAR | Status: AC | PRN

## 2014-09-05 MED ORDER — IOHEXOL 300 MG/ML  SOLN: 75.0000 mL | Freq: Once | INTRAMUSCULAR | Status: AC | PRN

## 2014-09-11 ENCOUNTER — Other Ambulatory Visit: Payer: Self-pay | Admitting: Otolaryngology

## 2014-10-02 ENCOUNTER — Other Ambulatory Visit (HOSPITAL_COMMUNITY): Payer: Self-pay | Admitting: Orthopaedic Surgery

## 2014-10-02 DIAGNOSIS — M25562 Pain in left knee: Secondary | ICD-10-CM

## 2014-10-09 NOTE — Progress Notes (Signed)
Chart reviewed by Dr Al Corpus, states patient will be better served at [facility] due to extensive medical history. Dr Deeann Saint office notified.

## 2014-10-12 ENCOUNTER — Ambulatory Visit (HOSPITAL_COMMUNITY)
Admission: RE | Admit: 2014-10-12 | Discharge: 2014-10-12 | Disposition: A | Discharge status: Home / Self Care | Payer: Medicare Other | Source: Ambulatory Visit | Attending: Orthopaedic Surgery | Admitting: Orthopaedic Surgery

## 2014-10-12 DIAGNOSIS — S83242A Other tear of medial meniscus, current injury, left knee, initial encounter: Secondary | ICD-10-CM | POA: Diagnosis not present

## 2014-10-12 DIAGNOSIS — X58XXXA Exposure to other specified factors, initial encounter: Secondary | ICD-10-CM | POA: Insufficient documentation

## 2014-10-12 DIAGNOSIS — M25562 Pain in left knee: Secondary | ICD-10-CM

## 2014-10-12 DIAGNOSIS — M7122 Synovial cyst of popliteal space [Baker], left knee: Secondary | ICD-10-CM | POA: Insufficient documentation

## 2014-11-02 NOTE — Progress Notes (Addendum)
Anesthesia Chart Review: Patient is a 81 year old female scheduled for left lateral parotidectomy on 11/14/14 by Dr. Benjamine Mola. DX: Left parotid mass. Case was initially scheduled at [facility], but moved to [facility] per anesthesiologist Dr. Al Corpus due to patient's multiple co-morbidities.  Other history includes non-smoker, HTN, non-smoker, RLE DVT with right PE '05, Factor V Leiden deficiency, asthma, hypercholesterolemia, esophageal reflux, hiatal hernia, CRI, cervical disk disease, neurogenic bladder, anxiety, anemia, Raynaud phenomenon, cholecystectomy, tonsillectomy, right parotid gland excision, skin cancer, IBS. PCP is listed as Dr. Asencion Noble (had also been seeing Dr. Lenna Gilford until 11/2013). GU Dr. Serita Butcher.   Meds include albuterol, allopurinol, chlorthalidone, diltiazem, Nexium, fenofibrate, Flonase, Adviar Diskus, Lasix, losartan, methenamine, KCl, Zoloft, Zocor, warfarin. She reported being instructed to hold warfarin following 11/08/14 dose and start Lovenox 11/11/14 per her PCP.   11/06/14 EKG: NSR, low voltage QRS.  12/11/04 Echo: SUMMARY - The left ventricle was small. Overall left ventricular systolic   function was at the lower limits of normal. Left ventricular   ejection fraction was estimated , range being 50 % to 55 %.   There is mild asymmetric septal hypertrophy. There was an   increased relative contribution of atrial contraction to left   ventricular filling consistent with grade 1 diastolic   dysfunction. - The aortic valve was mildly calcified. - Right ventricular systolic function was grossly normal. - The estimated peak pulmonary artery systolic pressure was mildly   increased. - There was mild tricuspid valvular regurgitation.  12/15/04 LHC/RHC: ASSESSMENT: 1. Normal coronary arteries without significant atherosclerotic burden. 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 2. Normal left ventricular function by  echocardiogram. 3. Normal right-sided pressures without evidence of intracardiac shunt and a normal PVR. 4. Exertional hypoxemia, likely secondary to intrinsic lung disease with VQ mismatching.  09/05/14 Neck CT: IMPRESSION: 11 mm solid nodule left anterior parotid gland worrisome for malignancy. Biopsy recommended. This could be a parotid malignancy or possibly a metastatic lymph node. Post surgical resection most of the right parotid gland. Marked atrophy of the submandibular gland bilaterally. Thyroid goiter.  Preoperative labs noted. BUN 37, Cr 1.63, appears stable since 05/29/13. Glucose 97. H/H 11.7/36.1. PT 28.3, INR 2.71, PTT 44. She will need a repeat PT/PTT on arrival.   If follow-up labs are acceptable and otherwise no acute changes then I would anticipate that she could proceed as planned.  [facility] Short Stay Center/Anesthesiology Phone [phone] 11/06/2014 4:48 PM

## 2014-11-06 ENCOUNTER — Encounter (HOSPITAL_COMMUNITY)
Admission: RE | Admit: 2014-11-06 | Discharge: 2014-11-06 | Disposition: A | Discharge status: Home / Self Care | Payer: Medicare Other | Source: Ambulatory Visit | Attending: Otolaryngology | Admitting: Otolaryngology

## 2014-11-06 ENCOUNTER — Encounter (HOSPITAL_COMMUNITY): Payer: Self-pay

## 2014-11-06 DIAGNOSIS — Z7901 Long term (current) use of anticoagulants: Secondary | ICD-10-CM | POA: Insufficient documentation

## 2014-11-06 DIAGNOSIS — Z01812 Encounter for preprocedural laboratory examination: Secondary | ICD-10-CM | POA: Diagnosis not present

## 2014-11-06 DIAGNOSIS — E780 Pure hypercholesterolemia: Secondary | ICD-10-CM | POA: Insufficient documentation

## 2014-11-06 DIAGNOSIS — J45909 Unspecified asthma, uncomplicated: Secondary | ICD-10-CM | POA: Insufficient documentation

## 2014-11-06 DIAGNOSIS — Z86718 Personal history of other venous thrombosis and embolism: Secondary | ICD-10-CM | POA: Diagnosis not present

## 2014-11-06 DIAGNOSIS — D6851 Activated protein C resistance: Secondary | ICD-10-CM | POA: Diagnosis not present

## 2014-11-06 DIAGNOSIS — Z86711 Personal history of pulmonary embolism: Secondary | ICD-10-CM | POA: Diagnosis not present

## 2014-11-06 DIAGNOSIS — Z79899 Other long term (current) drug therapy: Secondary | ICD-10-CM | POA: Diagnosis not present

## 2014-11-06 DIAGNOSIS — Z01818 Encounter for other preprocedural examination: Secondary | ICD-10-CM | POA: Diagnosis present

## 2014-11-06 DIAGNOSIS — R220 Localized swelling, mass and lump, head: Secondary | ICD-10-CM | POA: Insufficient documentation

## 2014-11-06 DIAGNOSIS — M5090 Cervical disc disorder, unspecified, unspecified cervical region: Secondary | ICD-10-CM | POA: Diagnosis not present

## 2014-11-06 DIAGNOSIS — I10 Essential (primary) hypertension: Secondary | ICD-10-CM | POA: Diagnosis not present

## 2014-11-06 DIAGNOSIS — K219 Gastro-esophageal reflux disease without esophagitis: Secondary | ICD-10-CM | POA: Diagnosis not present

## 2014-11-06 HISTORY — DX: Peripheral vascular disease, unspecified: I73.9

## 2014-11-06 HISTORY — DX: Disease of blood and blood-forming organs, unspecified: D75.9

## 2014-11-06 HISTORY — DX: Malignant (primary) neoplasm, unspecified: C80.1

## 2014-11-06 HISTORY — DX: Reserved for inherently not codable concepts without codable children: IMO0001

## 2014-11-06 LAB — CBC
HEMATOCRIT: 36.1 % (ref 36.0–46.0)
Hemoglobin: 11.7 g/dL — ABNORMAL LOW (ref 12.0–15.0)
MCH: 31.1 pg (ref 26.0–34.0)
MCHC: 32.4 g/dL (ref 30.0–36.0)
MCV: 96 fL (ref 78.0–100.0)
Platelets: 279 10*3/uL (ref 150–400)
RBC: 3.76 MIL/uL — ABNORMAL LOW (ref 3.87–5.11)
RDW: 14.1 % (ref 11.5–15.5)
WBC: 5.9 10*3/uL (ref 4.0–10.5)

## 2014-11-06 LAB — BASIC METABOLIC PANEL
Anion gap: 9 (ref 5–15)
BUN: 37 mg/dL — ABNORMAL HIGH (ref 6–20)
CALCIUM: 10 mg/dL (ref 8.9–10.3)
CO2: 25 mmol/L (ref 22–32)
CREATININE: 1.63 mg/dL — AB (ref 0.44–1.00)
Chloride: 103 mmol/L (ref 101–111)
GFR calc Af Amer: 32 mL/min — ABNORMAL LOW (ref >60)
GFR calc non Af Amer: 27 mL/min — ABNORMAL LOW (ref >60)
Glucose, Bld: 97 mg/dL (ref 65–99)
Potassium: 4.4 mmol/L (ref 3.5–5.1)
Sodium: 137 mmol/L (ref 135–145)

## 2014-11-06 LAB — PROTIME-INR
INR: 2.71 — ABNORMAL HIGH (ref 0.00–1.49)
Prothrombin Time: 28.3 seconds — ABNORMAL HIGH (ref 11.6–15.2)

## 2014-11-06 LAB — APTT: APTT: 44 s — AB (ref 24–37)

## 2014-11-06 NOTE — Pre-Procedure Instructions (Addendum)
Nancy Hicks  11/06/2014      [facility], [place] [address] [place] [address] Phone: [phone] Fax: [phone]  [facility] [place], [place] [address] [place] [address] Phone: [phone] Fax: [phone]    Your procedure is scheduled on 11/14/14  Report to [facility] short stay admitting at 800 A.M.  Call this number if you have problems the morning of surgery:  [phone]   Remember:  Do not eat food or drink liquids after midnight.  Take these medicines the morning of surgery with A SIP OF WATER inhalers as needed(bring albuterol), diltiazem, nexium,    STOP all herbel meds, nsaids (aleve,naproxen,advil,ibuprofen) 5 days prior to surgery starting 11/09/14 including all vitamins(biotin, citracal,vit D, multi vit),    STOP coumadin per dr   Lazaro Arms not wear jewelry, make-up or nail polish.  Do not wear lotions, powders, or perfumes.  You may wear deodorant.  Do not shave 48 hours prior to surgery.  Men may shave face and neck.  Do not bring valuables to the hospital.  [facility] is not responsible for any belongings or valuables.  Contacts, dentures or bridgework may not be worn into surgery.  Leave your suitcase in the car.  After surgery it may be brought to your room.  For patients admitted to the hospital, discharge time will be determined by your treatment team.  Patients discharged the day of surgery will not be allowed to drive home.   Name and phone number of your driver:    Special instructions:   Special Instructions: [facility] - Preparing for Surgery  Before surgery, you can play an important role.  Because skin is not sterile, your skin needs to be as free of germs as possible.  You can reduce the number of germs on you skin by washing with CHG (chlorahexidine gluconate) soap before surgery.  CHG is an antiseptic cleaner which  kills germs and bonds with the skin to continue killing germs even after washing.  Please DO NOT use if you have an allergy to CHG or antibacterial soaps.  If your skin becomes reddened/irritated stop using the CHG and inform your nurse when you arrive at Short Stay.  Do not shave (including legs and underarms) for at least 48 hours prior to the first CHG shower.  You may shave your face.  Please follow these instructions carefully:   1.  Shower with CHG Soap the night before surgery and the morning of Surgery.  2.  If you choose to wash your hair, wash your hair first as usual with your normal shampoo.  3.  After you shampoo, rinse your hair and body thoroughly to remove the Shampoo.  4.  Use CHG as you would any other liquid soap.  You can apply chg directly  to the skin and wash gently with scrungie or a clean washcloth.  5.  Apply the CHG Soap to your body ONLY FROM THE NECK DOWN.  Do not use on open wounds or open sores.  Avoid contact with your eyes ears, mouth and genitals (private parts).  Wash genitals (private parts)       with your normal soap.  6.  Wash thoroughly, paying special attention to the area where your surgery will be performed.  7.  Thoroughly rinse your body with warm water from the neck down.  8.  DO NOT shower/wash  with your normal soap after using and rinsing off the CHG Soap.  9.  Pat yourself dry with a clean towel.            10.  Wear clean pajamas.            11.  Place clean sheets on your bed the night of your first shower and do not sleep with pets.  Day of Surgery  Do not apply any lotions/deodorants the morning of surgery.  Please wear clean clothes to the hospital/surgery center.  Please read over the following fact sheets that you were given. Pain Booklet, Coughing and Deep Breathing and Surgical Site Infection Prevention

## 2014-11-09 ENCOUNTER — Other Ambulatory Visit: Payer: Self-pay | Admitting: Pulmonary Disease

## 2014-11-09 ENCOUNTER — Telehealth: Payer: Self-pay | Admitting: Pulmonary Disease

## 2014-11-09 NOTE — Telephone Encounter (Signed)
Opened in error

## 2014-11-09 NOTE — Telephone Encounter (Signed)
Pt aware that Ventolin has already been sent to pharmacy prior to her calling. Nothing further needed.

## 2014-11-14 ENCOUNTER — Encounter (HOSPITAL_COMMUNITY)
Admission: RE | Disposition: A | Discharge status: Home / Self Care | Payer: Self-pay | Source: Ambulatory Visit | Attending: Otolaryngology

## 2014-11-14 ENCOUNTER — Ambulatory Visit (HOSPITAL_COMMUNITY)
Admission: RE | Admit: 2014-11-14 | Discharge: 2014-11-15 | Disposition: A | Discharge status: Home / Self Care | Payer: Medicare Other | Source: Ambulatory Visit | Attending: Otolaryngology | Admitting: Otolaryngology

## 2014-11-14 ENCOUNTER — Ambulatory Visit (HOSPITAL_COMMUNITY): Payer: Medicare Other | Admitting: Anesthesiology

## 2014-11-14 ENCOUNTER — Encounter (HOSPITAL_COMMUNITY): Payer: Self-pay | Admitting: *Deleted

## 2014-11-14 ENCOUNTER — Ambulatory Visit (HOSPITAL_COMMUNITY): Payer: Medicare Other | Admitting: Vascular Surgery

## 2014-11-14 DIAGNOSIS — N289 Disorder of kidney and ureter, unspecified: Secondary | ICD-10-CM | POA: Insufficient documentation

## 2014-11-14 DIAGNOSIS — J449 Chronic obstructive pulmonary disease, unspecified: Secondary | ICD-10-CM | POA: Diagnosis not present

## 2014-11-14 DIAGNOSIS — K219 Gastro-esophageal reflux disease without esophagitis: Secondary | ICD-10-CM | POA: Diagnosis not present

## 2014-11-14 DIAGNOSIS — D682 Hereditary deficiency of other clotting factors: Secondary | ICD-10-CM | POA: Insufficient documentation

## 2014-11-14 DIAGNOSIS — R220 Localized swelling, mass and lump, head: Secondary | ICD-10-CM | POA: Diagnosis not present

## 2014-11-14 DIAGNOSIS — Z7901 Long term (current) use of anticoagulants: Secondary | ICD-10-CM | POA: Insufficient documentation

## 2014-11-14 DIAGNOSIS — I10 Essential (primary) hypertension: Secondary | ICD-10-CM | POA: Insufficient documentation

## 2014-11-14 DIAGNOSIS — Z86711 Personal history of pulmonary embolism: Secondary | ICD-10-CM | POA: Diagnosis not present

## 2014-11-14 DIAGNOSIS — J45909 Unspecified asthma, uncomplicated: Secondary | ICD-10-CM | POA: Insufficient documentation

## 2014-11-14 DIAGNOSIS — Z9889 Other specified postprocedural states: Secondary | ICD-10-CM

## 2014-11-14 HISTORY — PX: PAROTIDECTOMY: SHX2163

## 2014-11-14 LAB — PROTIME-INR
INR: 1.26 (ref 0.00–1.49)
Prothrombin Time: 16 seconds — ABNORMAL HIGH (ref 11.6–15.2)

## 2014-11-14 LAB — APTT: aPTT: 28 seconds (ref 24–37)

## 2014-11-14 SURGERY — EXCISION, PAROTID GLAND
Anesthesia: General | Laterality: Left

## 2014-11-14 MED ORDER — WHITE PETROLATUM GEL
Status: AC
  Administered 2014-11-14: 16:00:00
  Filled 2014-11-14: qty 1

## 2014-11-14 MED ORDER — FUROSEMIDE 20 MG PO TABS: 20.0000 mg | ORAL_TABLET | Freq: Every day | ORAL | Status: DC

## 2014-11-14 MED ORDER — OXYCODONE-ACETAMINOPHEN 5-325 MG PO TABS: 1.0000 | ORAL_TABLET | ORAL | Status: DC | PRN

## 2014-11-14 MED ORDER — VITAMIN D3 50 MCG (2000 UT) PO TABS: 1.0000 | ORAL_TABLET | Freq: Every day | ORAL | Status: DC

## 2014-11-14 MED ORDER — LIDOCAINE-EPINEPHRINE 1 %-1:100000 IJ SOLN
INTRAMUSCULAR | Status: AC
  Filled 2014-11-14: qty 1

## 2014-11-14 MED ORDER — CEFAZOLIN SODIUM-DEXTROSE 2-3 GM-% IV SOLR
INTRAVENOUS | Status: DC | PRN
  Administered 2014-11-14: 2 g via INTRAVENOUS

## 2014-11-14 MED ORDER — CALCIUM CARBONATE-VITAMIN D 500-200 MG-UNIT PO TABS
1.0000 | ORAL_TABLET | Freq: Every day | ORAL | Status: DC
  Administered 2014-11-15: 1 via ORAL
  Filled 2014-11-14: qty 1

## 2014-11-14 MED ORDER — AMOXICILLIN 875 MG PO TABS: 875.0000 mg | ORAL_TABLET | Freq: Two times a day (BID) | ORAL | Status: DC

## 2014-11-14 MED ORDER — LIDOCAINE HCL (CARDIAC) 20 MG/ML IV SOLN
INTRAVENOUS | Status: AC
  Filled 2014-11-14: qty 5

## 2014-11-14 MED ORDER — METHENAMINE MANDELATE 0.5 G PO TABS
500.0000 mg | ORAL_TABLET | Freq: Two times a day (BID) | ORAL | Status: DC
  Administered 2014-11-14 – 2014-11-15 (×2): 500 mg via ORAL
  Filled 2014-11-14 (×5): qty 1

## 2014-11-14 MED ORDER — SIMVASTATIN 10 MG PO TABS
10.0000 mg | ORAL_TABLET | Freq: Every day | ORAL | Status: DC
  Administered 2014-11-14: 10 mg via ORAL
  Filled 2014-11-14: qty 1

## 2014-11-14 MED ORDER — FENTANYL CITRATE (PF) 100 MCG/2ML IJ SOLN
INTRAMUSCULAR | Status: DC | PRN
  Administered 2014-11-14 (×3): 50 ug via INTRAVENOUS

## 2014-11-14 MED ORDER — POTASSIUM CHLORIDE CRYS ER 20 MEQ PO TBCR: 20.0000 meq | EXTENDED_RELEASE_TABLET | Freq: Every day | ORAL | Status: DC

## 2014-11-14 MED ORDER — PHENYLEPHRINE HCL 10 MG/ML IJ SOLN
10.0000 mg | INTRAVENOUS | Status: DC | PRN
  Administered 2014-11-14: 25 ug/min via INTRAVENOUS

## 2014-11-14 MED ORDER — LACTATED RINGERS IV SOLN
INTRAVENOUS | Status: DC
  Administered 2014-11-14: 09:00:00 via INTRAVENOUS

## 2014-11-14 MED ORDER — METHENAMINE HIPPURATE 1 G PO TABS: 500.0000 mg | ORAL_TABLET | Freq: Two times a day (BID) | ORAL | Status: DC

## 2014-11-14 MED ORDER — VITAMIN D 1000 UNITS PO TABS: 1000.0000 [IU] | ORAL_TABLET | Freq: Every day | ORAL | Status: DC

## 2014-11-14 MED ORDER — GLYCOPYRROLATE 0.2 MG/ML IJ SOLN
INTRAMUSCULAR | Status: DC | PRN
  Administered 2014-11-14: 0.2 mg via INTRAVENOUS

## 2014-11-14 MED ORDER — FENOFIBRATE 160 MG PO TABS
160.0000 mg | ORAL_TABLET | Freq: Every day | ORAL | Status: DC
  Filled 2014-11-14 (×2): qty 1

## 2014-11-14 MED ORDER — CALCIUM CITRATE-VITAMIN D 315-250 MG-UNIT PO TABS: 1.0000 | ORAL_TABLET | Freq: Every day | ORAL | Status: DC

## 2014-11-14 MED ORDER — SERTRALINE HCL 50 MG PO TABS
50.0000 mg | ORAL_TABLET | Freq: Every day | ORAL | Status: DC
  Administered 2014-11-14: 50 mg via ORAL
  Filled 2014-11-14: qty 1

## 2014-11-14 MED ORDER — KCL IN DEXTROSE-NACL 20-5-0.45 MEQ/L-%-% IV SOLN
INTRAVENOUS | Status: DC
Start: 2014-11-14 — End: 2014-11-15
  Administered 2014-11-14 – 2014-11-15 (×2): via INTRAVENOUS
  Filled 2014-11-14 (×2): qty 1000

## 2014-11-14 MED ORDER — SUCCINYLCHOLINE CHLORIDE 20 MG/ML IJ SOLN
INTRAMUSCULAR | Status: DC | PRN
  Administered 2014-11-14: 100 mg via INTRAVENOUS

## 2014-11-14 MED ORDER — 0.9 % SODIUM CHLORIDE (POUR BTL) OPTIME
TOPICAL | Status: DC | PRN
  Administered 2014-11-14: 1000 mL

## 2014-11-14 MED ORDER — LIDOCAINE HCL (CARDIAC) 20 MG/ML IV SOLN
INTRAVENOUS | Status: DC | PRN
  Administered 2014-11-14: 20 mg via INTRAVENOUS

## 2014-11-14 MED ORDER — CEFAZOLIN SODIUM-DEXTROSE 2-3 GM-% IV SOLR
INTRAVENOUS | Status: AC
  Filled 2014-11-14: qty 50

## 2014-11-14 MED ORDER — ONDANSETRON HCL 4 MG/2ML IJ SOLN
INTRAMUSCULAR | Status: AC
  Filled 2014-11-14: qty 2

## 2014-11-14 MED ORDER — CHLORTHALIDONE 25 MG PO TABS
12.5000 mg | ORAL_TABLET | Freq: Every day | ORAL | Status: DC
Start: 2014-11-14 — End: 2014-11-15
  Filled 2014-11-14 (×2): qty 0.5

## 2014-11-14 MED ORDER — ALBUTEROL SULFATE (2.5 MG/3ML) 0.083% IN NEBU: 2.5000 mg | INHALATION_SOLUTION | Freq: Four times a day (QID) | RESPIRATORY_TRACT | Status: DC | PRN

## 2014-11-14 MED ORDER — PROPOFOL 10 MG/ML IV BOLUS
INTRAVENOUS | Status: DC | PRN
  Administered 2014-11-14: 100 mg via INTRAVENOUS

## 2014-11-14 MED ORDER — LIDOCAINE HCL 4 % MT SOLN
OROMUCOSAL | Status: DC | PRN
  Administered 2014-11-14: 3 mL via TOPICAL

## 2014-11-14 MED ORDER — DILTIAZEM HCL ER COATED BEADS 240 MG PO CP24: 240.0000 mg | ORAL_CAPSULE | Freq: Every day | ORAL | Status: DC

## 2014-11-14 MED ORDER — LIDOCAINE-EPINEPHRINE 1 %-1:100000 IJ SOLN
INTRAMUSCULAR | Status: DC | PRN
  Administered 2014-11-14: 3 mL

## 2014-11-14 MED ORDER — MORPHINE SULFATE (PF) 2 MG/ML IV SOLN: 2.0000 mg | INTRAVENOUS | Status: DC | PRN

## 2014-11-14 MED ORDER — FENTANYL CITRATE (PF) 100 MCG/2ML IJ SOLN
25.0000 ug | INTRAMUSCULAR | Status: DC | PRN
Start: 2014-11-14 — End: 2014-11-14

## 2014-11-14 MED ORDER — PANTOPRAZOLE SODIUM 40 MG PO TBEC: 80.0000 mg | DELAYED_RELEASE_TABLET | Freq: Every day | ORAL | Status: DC

## 2014-11-14 MED ORDER — GLYCOPYRROLATE 0.2 MG/ML IJ SOLN
INTRAMUSCULAR | Status: AC
  Filled 2014-11-14: qty 1

## 2014-11-14 MED ORDER — ALLOPURINOL 300 MG PO TABS: 300.0000 mg | ORAL_TABLET | Freq: Every day | ORAL | Status: DC

## 2014-11-14 MED ORDER — LACTATED RINGERS IV SOLN
INTRAVENOUS | Status: DC | PRN
  Administered 2014-11-14 (×2): via INTRAVENOUS

## 2014-11-14 MED ORDER — ONDANSETRON HCL 4 MG/2ML IJ SOLN
INTRAMUSCULAR | Status: DC | PRN
  Administered 2014-11-14: 4 mg via INTRAVENOUS

## 2014-11-14 MED ORDER — MOMETASONE FURO-FORMOTEROL FUM 100-5 MCG/ACT IN AERO
2.0000 | INHALATION_SPRAY | Freq: Two times a day (BID) | RESPIRATORY_TRACT | Status: DC
  Filled 2014-11-14 (×2): qty 8.8

## 2014-11-14 MED ORDER — FENTANYL CITRATE (PF) 250 MCG/5ML IJ SOLN
INTRAMUSCULAR | Status: AC
  Filled 2014-11-14: qty 5

## 2014-11-14 MED ORDER — LOSARTAN POTASSIUM 50 MG PO TABS: 100.0000 mg | ORAL_TABLET | Freq: Every day | ORAL | Status: DC

## 2014-11-14 SURGICAL SUPPLY — 48 items
ADH SKN CLS APL DERMABOND .7 (GAUZE/BANDAGES/DRESSINGS) ×1
ATTRACTOMAT 16X20 MAGNETIC DRP (DRAPES) ×3 IMPLANT
BALL CTTN LRG ABS STRL LF (GAUZE/BANDAGES/DRESSINGS) ×1
BLADE SURG 15 STRL LF DISP TIS (BLADE) ×1 IMPLANT
BLADE SURG 15 STRL SS (BLADE) ×3
CANISTER SUCTION 2500CC (MISCELLANEOUS) ×3 IMPLANT
CLEANER TIP ELECTROSURG 2X2 (MISCELLANEOUS) ×3 IMPLANT
CONT SPEC 4OZ CLIKSEAL STRL BL (MISCELLANEOUS) ×3 IMPLANT
CORDS BIPOLAR (ELECTRODE) ×3 IMPLANT
COTTONBALL LRG STERILE PKG (GAUZE/BANDAGES/DRESSINGS) ×3 IMPLANT
COVER SURGICAL LIGHT HANDLE (MISCELLANEOUS) ×3 IMPLANT
CRADLE DONUT ADULT HEAD (MISCELLANEOUS) ×3 IMPLANT
DERMABOND ADVANCED (GAUZE/BANDAGES/DRESSINGS) ×2
DERMABOND ADVANCED .7 DNX12 (GAUZE/BANDAGES/DRESSINGS) ×1 IMPLANT
DRAIN CHANNEL 10F 3/8 F FF (DRAIN) ×3 IMPLANT
DRAPE PROXIMA HALF (DRAPES) ×2 IMPLANT
DRAPE SURG 17X23 STRL (DRAPES) ×1 IMPLANT
ELECT COATED BLADE 2.86 ST (ELECTRODE) ×3 IMPLANT
ELECT REM PT RETURN 9FT ADLT (ELECTROSURGICAL) ×3
ELECTRODE REM PT RTRN 9FT ADLT (ELECTROSURGICAL) ×1 IMPLANT
EVACUATOR SILICONE 100CC (DRAIN) ×3 IMPLANT
FORCEPS BIPOLAR SPETZLER 8 1.0 (NEUROSURGERY SUPPLIES) ×2 IMPLANT
GAUZE SPONGE 4X4 16PLY XRAY LF (GAUZE/BANDAGES/DRESSINGS) ×3 IMPLANT
GLOVE BIO SURGEON STRL SZ 6.5 (GLOVE) ×2 IMPLANT
GLOVE BIO SURGEONS STRL SZ 6.5 (GLOVE) ×1
GLOVE ECLIPSE 7.5 STRL STRAW (GLOVE) ×3 IMPLANT
GOWN STRL REUS W/ TWL LRG LVL3 (GOWN DISPOSABLE) ×3 IMPLANT
GOWN STRL REUS W/TWL LRG LVL3 (GOWN DISPOSABLE) ×9
KIT BASIN OR (CUSTOM PROCEDURE TRAY) ×3 IMPLANT
KIT ROOM TURNOVER OR (KITS) ×3 IMPLANT
NDL HYPO 25GX1X1/2 BEV (NEEDLE) ×1 IMPLANT
NEEDLE HYPO 25GX1X1/2 BEV (NEEDLE) ×3 IMPLANT
NS IRRIG 1000ML POUR BTL (IV SOLUTION) ×3 IMPLANT
PAD ARMBOARD 7.5X6 YLW CONV (MISCELLANEOUS) ×3 IMPLANT
PENCIL BUTTON HOLSTER BLD 10FT (ELECTRODE) ×3 IMPLANT
PROBE NERVBE PRASS .33 (MISCELLANEOUS) ×3 IMPLANT
SHEARS HARMONIC 9CM CVD (BLADE) ×3 IMPLANT
SPONGE INTESTINAL PEANUT (DISPOSABLE) ×6 IMPLANT
SUT CHROMIC 4 0 PS 2 18 (SUTURE) IMPLANT
SUT PROLENE 5 0 P 3 (SUTURE) ×1 IMPLANT
SUT SILK 2 0 FS (SUTURE) IMPLANT
SUT SILK 3 0 (SUTURE) ×3
SUT SILK 3 0 SH CR/8 (SUTURE) ×3 IMPLANT
SUT SILK 3-0 18XBRD TIE 12 (SUTURE) ×1 IMPLANT
SUT VIC AB 4-0 RB1 27 (SUTURE) ×3
SUT VIC AB 4-0 RB1 27X BRD (SUTURE) ×1 IMPLANT
TOWEL OR 17X24 6PK STRL BLUE (TOWEL DISPOSABLE) ×5 IMPLANT
TRAY ENT MC OR (CUSTOM PROCEDURE TRAY) ×3 IMPLANT

## 2014-11-14 NOTE — H&P (Signed)
Cc: Left parotid mass  HPI: The patient is a 81 y/o female who returns today for discussion of her left facial mass.  The patient has some concerns about the surgical procedure. The patient has a history of right parotidectomy in the 1980s. According to the patient, the pathology showed no malignancy. At her previous visit, she was noted to have a left anterior parotid mass. She underwent a neck CT scan. The CT showed a 1.1 cm solid nodule at the left anterior parotid gland. The margins were ill-defined. No other adenopathy was noted. The patient has a history of Factor 5 deficiency and is currently on Coumadin following a pulmonary embolism 6 years ago. No other ENT, GI, or respiratory issue noted since the last visit.   Exam General: Communicates without difficulty, well nourished, no acute distress. Head: Normocephalic, no evidence injury, no tenderness, facial buttresses intact without stepoff. Eyes: PERRL, EOMI. No scleral icterus, conjunctivae clear. Neuro: CN II exam reveals vision grossly intact.  No nystagmus at any point of gaze. Ears: Auricles well formed without lesions.  Ear canals are intact without mass or lesion.  No erythema or edema is appreciated.  The TMs are intact without fluid. Nose: External evaluation reveals normal support and skin without lesions.  Dorsum is intact.  Anterior rhinoscopy reveals healthy pink mucosa over anterior aspect of inferior turbinates and intact septum.  No purulence noted. Oral:  Oral cavity and oropharynx are intact, symmetric, without erythema or edema.  Mucosa is moist without lesions. Neck: Full range of motion without pain.  There is no significant lymphadenopathy.  No masses palpable.  Thyroid bed within normal limits to palpation.  Previous right parotid incision is well healed. A palpable 1 cm mass is noted in the left parotid gland. Neuro:  CN 2-12 grossly intact. Gait normal. Vestibular: No nystagmus at any point of gaze. The cerebellar examination  is unremarkable.   Assessment The patient has a 1.1 cm left parotid mass, with ill-defined margins.   Plan 1. The physical exam findings and the CT images are again reviewed with the patient. 2. The treatment options are extensively discussed with the patient and her daughter. The options include conservative observation, fine-needle aspiration biopsy of the lesion, or surgical excision of the parotid lesion. The risks, benefits, and details of the treatment options are again reviewed. 3. The patient would like to proceed with the superficial parotidectomy surgery. We will schedule the procedure in accordance with the patient's schedule.

## 2014-11-14 NOTE — Op Note (Signed)
DATE OF PROCEDURE:  11/14/2014                              OPERATIVE REPORT  SURGEON:  Leta Baptist, MD  ASSISTANT: Rometta Emery, PA-C  PREOPERATIVE DIAGNOSES: Left parotid mass  POSTOPERATIVE DIAGNOSES: Left parotid mass  PROCEDURE PERFORMED:  Left lateral parotidectomy with facial nerve dissection  ANESTHESIA:  General endotracheal tube anesthesia.  COMPLICATIONS:  None.  ESTIMATED BLOOD LOSS:  Less than 20 ml.  INDICATION FOR PROCEDURE:  Nancy Hicks is a 81 y.o. female with a history of a left parotid mass. She recently underwent a neck CT scan. The CT showed a 1.1 cm solid nodule at the left anterior parotid gland. The margins were ill-defined. No other adenopathy was noted. It should be noted that the patient previously underwent right parotidectomy in the 1980s. The pathology showed no malignancy at that time. The patient would like to have the left parotid mass removed. The risks, benefits, alternatives, and details of the procedure were discussed with the patient.  Questions were invited and answered.  Informed consent was obtained.  DESCRIPTION:  The patient was taken to the operating room and placed supine on the operating table.  General endotracheal tube anesthesia was administered by the anesthesiologist.  The patient was positioned and prepped and draped in a standard fashion for left parotidectomy. Facial nerve monitoring electrodes were placed. The facial nerve monitoring system was functional throughout the case.  1% lidocaine with 1-100,000 epinephrine was infiltrated at the planned site of incision. A curved lazy S incision was made anterior to the left ear, curving down to the left lateral neck. The incision was carried to the subcutaneous level. A skin flap was elevated over the left parotid gland. Careful dissection was performed anterior to the left auricular cartilage. The main trunk of the facial nerve was identified and preserved. Careful dissection was then  performed along the facial nerve and the branches of the facial nerve. A 1 cm solid mass was noted at the left anterior parotid gland. The mass was carefully dissected free from the parotid gland. All branches of the facial nerve were identified and preserved. The nerve was noted to be functional throughout the case. The surgical site was copiously irrigated. A #10 JP drain was placed.  The care of the patient was turned over to the anesthesiologist.  The patient was awakened from anesthesia without difficulty.  She was extubated and transferred to the recovery room in good condition.  OPERATIVE FINDINGS:  A 1 cm left anterior parotid mass was noted.  SPECIMEN:  Left parotid mass.  FOLLOWUP CARE:  The patient will be admitted for overnight observation. She will most likely be discharged home tomorrow.  [name] 11/14/2014 11:52 AM

## 2014-11-14 NOTE — Anesthesia Procedure Notes (Signed)
Procedure Name: Intubation Date/Time: 11/14/2014 10:06 AM Performed by: Susa Loffler Pre-anesthesia Checklist: Patient identified, Timeout performed, Emergency Drugs available, Suction available and Patient being monitored Patient Re-evaluated:Patient Re-evaluated prior to inductionOxygen Delivery Method: Circle system utilized Preoxygenation: Pre-oxygenation with 100% oxygen Intubation Type: IV induction Ventilation: Mask ventilation without difficulty Laryngoscope size: small glidescope blade (green) Grade View: Grade I Tube type: Oral Tube size: 7.0 mm Number of attempts: 1 Airway Equipment and Method: Stylet,  LTA kit utilized and Video-laryngoscopy Placement Confirmation: ETT inserted through vocal cords under direct vision,  positive ETCO2 and breath sounds checked- equal and bilateral Secured at: 21 cm Tube secured with: Tape Dental Injury: Teeth and Oropharynx as per pre-operative assessment

## 2014-11-14 NOTE — Progress Notes (Signed)
Admitted to room 6n23 from PACU, A/Ox4, oriented to room and surroundings, family at bedside, denies nausea/pain at this time.

## 2014-11-14 NOTE — Anesthesia Postprocedure Evaluation (Signed)
  Anesthesia Post-op Note  Patient: Nancy Hicks  Procedure(s) Performed: Procedure(s): LEFT LATERAL PAROTIDECTOMY (Left)  Patient Location: PACU  Anesthesia Type:General  Level of Consciousness: awake, alert , oriented and patient cooperative  Airway and Oxygen Therapy: Patient Spontanous Breathing and Patient connected to nasal cannula oxygen  Post-op Pain: none  Post-op Assessment: Post-op Vital signs reviewed, Patient's Cardiovascular Status Stable, Respiratory Function Stable, Patent Airway, No signs of Nausea or vomiting and Pain level controlled              Post-op Vital Signs: Reviewed and stable  Last Vitals:  Filed Vitals:   11/14/14 1251  BP: 109/51  Pulse: 85  Temp:   Resp: 18    Complications: No apparent anesthesia complications

## 2014-11-14 NOTE — Discharge Instructions (Signed)
Parotidectomy Care After Refer to this sheet in the next few weeks. These instructions provide you with information on caring for yourself after your procedure. Your caregiver may also give you more specific instructions. Your treatment has been planned according to current medical practices, but problems sometimes occur. Call your caregiver if you have any problems or questions after your procedure. HOME CARE INSTRUCTIONS Wound care  Keep your incision clean after the dressing is off.  Check your incision every day to make sure that it is not red.  You can probably shower about 1 day after the drain is out. You must keep the incision area dry.  Your stitches will be taken out after about 1 week. Pain  Some pain is normal after a parotidectomy. Take whatever pain medicine your surgeon prescribes. Follow the directions carefully.  Do not take over-the-counter pain medicine unless approved by your caregiver.Some painkillers, like aspirin, can cause bleeding. Diet  You can eat like you normally do once you are home. However, it might hurt to chew for a while. Stay away from foods that are hard to chew.  It may help to take your pain medicine about 30 minutes before you eat. Other precautions  Keep your head propped up when you lie down. Try using 2 pillows to do this. Do it for about 2 weeks after your surgery.  You can probably go back to your normal routine after a few days.However, do not do anything that requires great effort until your surgeon says it is okay. SEEK MEDICAL CARE IF:  You have any questions about your medicines.  Pain does not go away, even after taking pain medicine.  You vomit or feel nauseous. SEEK IMMEDIATE MEDICAL CARE IF:   You are taking pain medicine but your pain gets much worse.  Yourincision looks red and swollen or blood or fluid leaks from the wound.  Skin on your ear or face gets red and swollen.  Your face is numb or feels weak.  You  have a fever. MAKE SURE YOU:  Understand these instructions.  Will watch your condition.  Will get help right away if you are not doing well or get worse. Document Released: 04/18/2010 Document Revised: 06/08/2011 Document Reviewed: 04/18/2010 [facility] Patient Information 2015 [place], [place]. This information is not intended to replace advice given to you by your health care provider. Make sure you discuss any questions you have with your health care provider.

## 2014-11-14 NOTE — Progress Notes (Signed)
Epic was down, pt was ready except for reviewing medications and charting. Dr. Glennon Mac at bedside at present.

## 2014-11-14 NOTE — Transfer of Care (Signed)
Immediate Anesthesia Transfer of Care Note  Patient: Nancy Hicks  Procedure(s) Performed: Procedure(s): LEFT LATERAL PAROTIDECTOMY (Left)  Patient Location: PACU  Anesthesia Type:General  Level of Consciousness: awake, alert  and oriented  Airway & Oxygen Therapy: Patient Spontanous Breathing and Patient connected to nasal cannula oxygen  Post-op Assessment: Report given to RN and Post -op Vital signs reviewed and stable  Post vital signs: Reviewed and stable  Last Vitals:  Filed Vitals:   11/14/14 0808  BP: 133/52  Pulse: 72  Temp: 36.6 C  Resp: 20    Complications: No apparent anesthesia complications

## 2014-11-14 NOTE — Anesthesia Preprocedure Evaluation (Addendum)
Anesthesia Evaluation  Patient identified by MRN, date of birth, ID band Patient awake    Reviewed: Allergy & Precautions, NPO status , Patient's Chart, lab work & pertinent test results  History of Anesthesia Complications Negative for: history of anesthetic complications  Airway Mallampati: III  TM Distance: >3 FB Neck ROM: Full  Mouth opening: Limited Mouth Opening  Dental  (+) Caps, Dental Advisory Given   Pulmonary asthma , COPD COPD inhaler, PE (h/o PE several years ago: required O2 at that time) breath sounds clear to auscultation        Cardiovascular hypertension, Pt. on medications - anginaRhythm:Regular Rate:Normal  '06 ECHO: EF 50-55%, valves OK   Neuro/Psych negative neurological ROS     GI/Hepatic Neg liver ROS, hiatal hernia, GERD-  Medicated and Controlled,  Endo/Other  negative endocrine ROS  Renal/GU Renal InsufficiencyRenal disease (creat 1.63)     Musculoskeletal   Abdominal   Peds  Hematology  (+) Blood dyscrasia (Factor V deficiency: Coumadin (off x 5 days), INR 1.26), ,   Anesthesia Other Findings   Reproductive/Obstetrics                            Anesthesia Physical Anesthesia Plan  ASA: III  Anesthesia Plan: General   Post-op Pain Management:    Induction: Intravenous  Airway Management Planned: Oral ETT and Video Laryngoscope Planned  Additional Equipment:   Intra-op Plan:   Post-operative Plan: Extubation in OR  Informed Consent: I have reviewed the patients History and Physical, chart, labs and discussed the procedure including the risks, benefits and alternatives for the proposed anesthesia with the patient or authorized representative who has indicated his/her understanding and acceptance.   Dental advisory given  Plan Discussed with: CRNA and Surgeon  Anesthesia Plan Comments: (Plan routine monitors, GETA with VideoGlide intubation  Jenita Seashore,  MD)        Anesthesia Quick Evaluation

## 2014-11-15 ENCOUNTER — Encounter (HOSPITAL_COMMUNITY): Payer: Self-pay | Admitting: Otolaryngology

## 2014-11-15 DIAGNOSIS — R220 Localized swelling, mass and lump, head: Secondary | ICD-10-CM | POA: Diagnosis not present

## 2014-11-15 NOTE — Discharge Summary (Signed)
Physician Discharge Summary  Patient ID: Nancy Hicks MRN: [id number] DOB/AGE: 07-29-1926 81 y.o.  Admit date: 11/14/2014 Discharge date: 11/15/2014  Admission Diagnoses: Left parotid mass  Discharge Diagnoses: Left parotid mass Active Problems:   H/O superficial parotidectomy   Discharged Condition: good  Hospital Course: Pt had an uneventful overnight stay. Pt tolerated po well. No bleeding. No stridor. Facial nerve function intact bilaterally.  Consults: None  Significant Diagnostic Studies: none  Treatments: surgery: Left superficial parotidectomy  Discharge Exam: Blood pressure 143/55, pulse 68, temperature 98.1 F (36.7 C), temperature source Oral, resp. rate 16, height 5' (1.524 m), weight 61.236 kg (135 lb), SpO2 94 %. Incision/Wound:c/d/i cn7 intact  Disposition: 01-Home or Self Care  Discharge Instructions    Activity as tolerated - No restrictions    Complete by:  As directed      Diet general    Complete by:  As directed             Medication List    STOP taking these medications        enoxaparin 30 MG/0.3ML injection  Commonly known as:  LOVENOX     warfarin 2.5 MG tablet  Commonly known as:  COUMADIN      TAKE these medications        allopurinol 300 MG tablet  Commonly known as:  ZYLOPRIM  Take 300 mg by mouth daily.     amoxicillin 875 MG tablet  Commonly known as:  AMOXIL  Take 1 tablet (875 mg total) by mouth 2 (two) times daily.     Biotin 5000 MCG Caps  Take 1 capsule by mouth daily.     chlorthalidone 25 MG tablet  Commonly known as:  HYGROTON  Take 12.5 mg by mouth daily.     CITRACAL MAXIMUM 315-250 MG-UNIT Tabs  Generic drug:  Calcium Citrate-Vitamin D  Take 1 capsule by mouth daily.     CITRUCEL oral powder  Generic drug:  methylcellulose  Take 1 tsp by mouth daily     diltiazem 240 MG 24 hr capsule  Commonly known as:  CARDIZEM CD  take 1 capsule by mouth once daily     esomeprazole 40 MG capsule   Commonly known as:  NEXIUM  Take 40 mg by mouth daily before breakfast. Take one tablet by mouth twice a day     fenofibrate 160 MG tablet  take 1 tablet once daily     fluticasone 50 MCG/ACT nasal spray  Commonly known as:  FLONASE  instill 2 sprays into each nostril once daily     Fluticasone-Salmeterol 100-50 MCG/DOSE Aepb  Commonly known as:  ADVAIR DISKUS  inhale 1 dose by mouth twice a day     furosemide 20 MG tablet  Commonly known as:  LASIX  Take 1 tablet (20 mg total) by mouth daily.     losartan 100 MG tablet  Commonly known as:  COZAAR  take 1 tablet by mouth once daily     methenamine 1 G tablet  Commonly known as:  HIPREX  Take 500 mg by mouth 2 (two) times daily with a meal.     multivitamin with minerals Tabs tablet  Take 1 tablet by mouth daily.     oxyCODONE-acetaminophen 5-325 MG per tablet  Commonly known as:  ROXICET  Take 1-2 tablets by mouth every 4 (four) hours as needed for severe pain.     potassium chloride SA 20 MEQ tablet  Commonly known as:  K-DUR,KLOR-CON  take 1 tablet by mouth once daily     sertraline 50 MG tablet  Commonly known as:  ZOLOFT  Take 1 tablet (50 mg total) by mouth daily.     simvastatin 10 MG tablet  Commonly known as:  ZOCOR  Take 10 mg by mouth at bedtime.     SYSTANE ULTRA PF 0.4-0.3 % Soln  Generic drug:  Polyethyl Glycol-Propyl Glycol  Place 2 drops into both eyes 3 (three) times daily.     VENTOLIN HFA 108 (90 BASE) MCG/ACT inhaler  Generic drug:  albuterol  inhale 2 puffs by mouth every 6 hours AS NEEDED FOR WHEEZING     Vitamin D3 2000 UNITS Tabs  Take 1 tablet by mouth daily.           Follow-up Information    Follow up with [name], MD On 11/21/2014.   Specialty:  Otolaryngology   Why:  at 4:10pm   Contact information:   [facility] [address] [place] [address] [phone]       Signed: [name] 11/15/2014, 7:42 AM

## 2014-12-04 ENCOUNTER — Ambulatory Visit (INDEPENDENT_AMBULATORY_CARE_PROVIDER_SITE_OTHER): Payer: Medicare Other | Admitting: Pulmonary Disease

## 2014-12-04 ENCOUNTER — Encounter: Payer: Self-pay | Admitting: Pulmonary Disease

## 2014-12-04 VITALS — BP 114/64 | HR 82 | Temp 97.4°F | Wt 133.8 lb

## 2014-12-04 DIAGNOSIS — Z8672 Personal history of thrombophlebitis: Secondary | ICD-10-CM

## 2014-12-04 DIAGNOSIS — K449 Diaphragmatic hernia without obstruction or gangrene: Secondary | ICD-10-CM | POA: Diagnosis not present

## 2014-12-04 DIAGNOSIS — J449 Chronic obstructive pulmonary disease, unspecified: Secondary | ICD-10-CM

## 2014-12-04 DIAGNOSIS — M1990 Unspecified osteoarthritis, unspecified site: Secondary | ICD-10-CM

## 2014-12-04 DIAGNOSIS — Z7901 Long term (current) use of anticoagulants: Secondary | ICD-10-CM

## 2014-12-04 DIAGNOSIS — K219 Gastro-esophageal reflux disease without esophagitis: Secondary | ICD-10-CM | POA: Diagnosis not present

## 2014-12-04 DIAGNOSIS — Z86718 Personal history of other venous thrombosis and embolism: Secondary | ICD-10-CM

## 2014-12-04 MED ORDER — FLUTICASONE-SALMETEROL 100-50 MCG/DOSE IN AEPB: INHALATION_SPRAY | RESPIRATORY_TRACT | Status: DC

## 2014-12-04 MED ORDER — FLUTICASONE-SALMETEROL 100-50 MCG/DOSE IN AEPB: 1.0000 | INHALATION_SPRAY | Freq: Two times a day (BID) | RESPIRATORY_TRACT | Status: DC

## 2014-12-04 NOTE — Patient Instructions (Signed)
Today we updated your med list in our EPIC system...    Continue your current medications the same...    We refilled your Advair100 as discussed...  We will try to set up a second opinion consult w/ DrAlusio regarding your left knee...  Call for any questions...  Let's plan a follow up visit in 50yr, sooner if needed for problems.Marland KitchenMarland Kitchen

## 2014-12-04 NOTE — Progress Notes (Signed)
Subjective:    Patient ID: Nancy Hicks, female    DOB: 05-18-1926, 81 Nancyo.   MRN: [id number]  HPI 81 y/o WF here for a follow up visit... she has multiple medical problems as noted below & is followed both here and [place] by DrFagin...  ~  SEE PREV EPIC NOTES FOR OLDER DATA >>    LABS 2/14:  FLP- at goals on simva10+Feno160;  Chems- ok w/ Creat=1.4;  CBC- ok w/ Hg=11.5;  TSH= 1.56...   ~  May 29, 2013:  87mo ROV & [name] is c/o some sinusitis, left ear discomfort, low grade temp, and feeling rundown;  She is blowing out some yellow mucus, no blood, & has left sided sinus HA;  We discussed Rx w/ ZPak, Flonase, & Medrol Dosepak for relief, along w/ Mucinex, Saline, & fluids...  We reviewed the following medical problems during today's office visit >>     Asthma> on Advair100Bid & Proair prn; breathing is good w/o recent exac; also uses OTC Antihist & Flonase as needed for nasal symptoms...    HxPE/ DVT/ FactorV deficiency> on Coumadin via [place] coumadin clinic & doing satis by her report; she denies leg pain, swelling, etc...     HBP> on Cardizem240, Cozaar100, Lasix40-1/2, & K20; BP= 120/70 & occas higher at home; denies CP, palpit, ch [place] SOB or edema...    CHOL> on Simva10 + Feno160; FLP 3/15 showed TChol 153, TG 114, HDL 49, LDL 81    Hx severe GERD> hx largeHH & severe GERD, followed by DrPatterson & eval at [facility] as well- symptoms improved on Nexium40Bid    Divertics> on Citrucel daily; she had acute diverticulitis 2/13 treated by DrFagin [place]; doing better since then & denies abd pain, n/v, c/d, blood seen...    Renal Insuffic> hx mild RI w/ BUN=35, Cr= 1.6    GU- Neurogenic bladder> followed by Urology; on Methenamine500Bid to prevent bladder infections & improved; she used to self cath but has been voiding satis recently...    DJD, hyperuricemia, Osteopenia> Hx Cspine dis w/ myelopathy, followed by [facility] & improved; on Allopurinol300 w/ Uric= 3-4 range on  this...    Anxiety> on Zoloft100-1/2 daily...    Anemia> Hg stable Hicks the 11 to 12 range... We reviewed prob list, meds, xrays and labs> see below for updates >>   CXR 3/15 showed norm heart size, largeHH, scoliosis, clear lungs & NAD...  LABS 3/15:  FLP at goals on Simva10 & Feno160;  Chems- ok x mild RI w/ BUN=35, Cr=1.6;  CBC- ok x Hg=11.9;  TSH=1.39  ~  November 29, 2013:  15mo ROV & [name] reports a good interval after being treated for AB 3/15; she has maintained on her ADVAIR100Bid & has AlbutHFA for prn use;  She denies much cough, sputum, dyspnea, etc;  She is too sedentary 7 [place] to [place] outside, no facilities nearby etc;  Asked to consider silver sneakers vs home exercise program, etc;  As noted she has a large HH & symptoms cntrolled w/ Nexium40Bid, antireflux regimen, elev HOB, NPO after dinner, etc;  She continues to f/u w/ DrFagin [place] on Coumadin w/ Hx DVT & PE- stable;  Hx HBP, Hyperlipidemia, renal insuffic & neurogenic bladder, other issues as noted> all stable on meds w/ labs per DrFagin...   We reviewed prob list, meds, xrays and labs> see below for updates >>   ~  December 04, 2014:  69yr [place] reports a good year from the pulmonary standpoint- stable on Advair100Bid & hasn't needed the [place] rescue inhaler Hicks >40yr; she notes breathing is good, no cough/ sputum/ hemoptysis/ SOB or chest discomfort; unfortunately she has had a lot of orthopedic problems left knee & left hip especially, she been told she needs a TKR but Ortho reluctant to do it w/ her hx DVT/ PTE, we discussed getting a second opinion & she would like to see DrAlusio... She also had a knot develop on her left parotid glad=> eval & surg by [facility] 8/16 w/ atypical lymphoid infiltrate & they are awaiting special studies to r/o any cancer cells... We reviewed the following medical problems during today's office visit >> she is followed by DrFagin for Primary  Care.    Left parotid nodule excised by DrTeoh 8/16 => final path pending...    Asthma> on Advair100Bid & Proair prn; breathing is good w/o recent exac; also uses OTC Antihist & Flonase as needed for nasal symptoms...    HxPE/ DVT/ FactorV deficiency> on Coumadin via [place] coumadin clinic & doing satis by her report; she denies leg pain, swelling, etc...     HBP> on Cardizem240, Cozaar100, Lasix20, ?Hygroton12.5, & K20; BP= 114/64 & similar at home; denies CP, palpit, ch [place] SOB or edema; she will check w/ PCP re: diuretics.    CHOL> on Simva10 + Feno160; FLP 3/15 showed TChol 153, TG 114, HDL 49, LDL 81 (labs [place] by DrFagin)    Hx severe GERD> hx largeHH & severe GERD, followed by DrPatterson & eval at [facility] as well- symptoms improved on Nexium40Qd & antireflux regimen.    Divertics> on Citrucel daily; she had acute diverticulitis 2/13 treated by DrFagin [place]; doing better since then & denies abd pain, n/v, c/d, blood seen...    Renal Insuffic> hx mild RI w/ BUN=37, Cr= 1.6 (stable)    GU- Neurogenic bladder> followed by Urology; on Methenamine500Bid to prevent bladder infections & improved; she used to self cath but has been voiding satis recently...    DJD, hyperuricemia, Osteopenia> Hx Cspine dis w/ myelopathy, followed by [facility] & improved; on Allopurinol300 w/ Uric= 3-4 range on this; she has developed severe left knee DJD & si told she needs a TKR but the surg [place] is reluctant due to her hx DVT, Factor V Leiden defic => we will refer to DrAlusio for a seconed opinion consult...    Anxiety> on Zoloft100-1/2 daily...    Anemia> Hg stable Hicks the 11 to 12 range... IMP/PLAN>>  [name]' breathing is stable & she will continue the Advair regularly & the Ventolin prn;  We will refer her to Ortho for second opinion regarding her severe left knee DJD           Problem List:   she had PNEUMOVAX 2004, & gets yearly Flu shots each Fall... TDAP given 8/11.  ASTHMA  (ICD-493.90) - on ADVAIR 100Bid & VentolinHFA Prn... prev required O2 and Medrol but not Hicks some time now- doing well- stable without cough, sputum, SOB, wheezing, CP, etc... prev on allergy vaccine & she stopped it... she has noted incr dyspnea w/ lack of exercise & improved back on exerc program. ~  NOTE:  large HH seen on CXR, & takes Nexium Tid... ~  CXR 2/11 showed large HH, DJD spine, clear lungs, NAD.Marland Kitchen. ~  CXR 2/13 showed large HH, DJD/ scoliosis of spine, NAD.Marland Kitchen. ~  8/13:  She is too sedentary &  notes some DOE w/ ADLs; rec to enter cardio-pulm rehab program... ~  2/14: she reports that [place] has really helped; now doing her own exercises at home... ~  8/14: on Advair100Bid & Proair prn; breathing is good w/o recent exac; also uses OTC Antihist & Flonase as needed for nasal symptoms. ~  9/15: on Advair100Bid & AlbutHFA rescue inhaler prn; she reports a good 51mo interval w/o recurrent exac etc...  PULMONARY EMBOLISM, HX OF (ICD-V12.51) & FACTOR V DEFICIENCY (ICD-286.3) - last hosp [place] 9/06 for R heart cath to r/o pulmHTN & normal pressures were found... DEEP VENOUS THROMBOPHLEBITIS, HX OF (ICD-V12.52) - as above, & she wears TED hose, no edema... ~  DVT Hicks right leg and PTE on CTAngio Hicks 4/05 and treated w/ Hep/ COUMADIN (protimes followed [place] by DrFagan)...   HYPERTENSION (ICD-401.9) - on DILTIAZEM 240mg /d, LOSARTAN 100mg /d, LASIX 40mg - 1/2 tab daily, & K20/d... ~  2DEcho 6/06 showed normal LV wall thicknees & LVF, ? mild pulm HTN w/ RVsys est 30-40 ~  8/12:  BP= 134/76 & she denies CP, palpit, dizzy, SOB, edema... ~  2/13:  BP= 140/60 & she remains asymptomatic... ~  2/14: on Diltiazem240, Losartan50, Lasix20> BP= 130/70 today & she denies CP, palpit, ch Hicks DOE, edema, etc ~  8/14: on Cardizem240, Cozaar100, Lasix40-1/2, & K20; BP= 142/84 & occas higher at home; may needs addition of BBlocker but holding off for now; denies CP, palpit, ch [place] SOB or edema. ~  9/15: on same meds  w/ BP= 120/60 & she denies CP, palpit, SOB, dizzy, edema... ~  9/16: she lists the same meds + hygroton & she will check w/ her PCP- BP= 114/64 & she remains asymptomatic, no edema, renal insuffic stable.  HYPERCHOLESTEROLEMIA (ICD-272.0) - on SIMVASTATIN 10mg /d now & FENOFIBRATE 160mg /d... ~  FLP 6/08 (on [place]col & Tricor) showed TChol 165, TG 94, HDL 49, LDL 97... rec- contin same. ~  [place] 7/09 showed TChol 152, TG 123, HDL 45, LDL 83... rec- change to Simva40 + Fenofib160 for $$. ~  FLP 7/10 showed TChol 160, TG 107, HDL 51, LDL 88... continue same. ~  [place] 8/11 (on Simva10+Feno160) showed TChol 154, TG 102, HDL 55, LDL 79... stable, same Rx. ~  [place] 2/12 (on Simva10+Feno160) showed TChol 152, TG 118, HDL 48, LDL 80 ~  FLP 2/13 (on Simva10+Feno160) showed TChol 172, TG 123, HDL 52, LDL 95 ~  FLP 2/14 on Simva10+Feno160 showed TChol 140, TG 125, HDL 44, LDL 71  ~  FLP 3/15 on simva20+Feno160 showed TChol 153, TG 114, HDL 49, LDL 81  GASTROESOPHAGEAL REFLUX DISEASE, SEVERE (ICD-530.81) - she has a large HH seen on routine CXRs; severe GERD followed by DrPatterson; on [place] 40mg - recent decr to 1/d... c/o gas & encouraged to take GasX, Mylicon, BeanO. ~  last EGD 6/06 showed large HH & stricture dilated... ~  GI eval for incr dysphagia 1/12 w/ EGD by DrPatterson showing large prolapsing HH, candida esoph, erosions & polyps> treated w/ Diflucan, Nexium decr to once daily. ~  She reports stable on Nexium 40mg  /d... ~  2013: She's had f/u w/ DrPatterson> chr GERD, intermit solid food dysphagia; he rec Ba Swallow, Manometry & 24H pH probe, and appt at [facility]... ~  7/13:  Ba Swallow showed web-like narrowing Hicks cerv esoph, presbyesoph, tortuous mid-distal esoph, largeHH, spasm distally, 82mm Ba tablet temporarily lodged Hicks mid-esoph... ~  2/14: she reports much improved on Nexium40Bid... ~  She continues to  f/u w/ GI at [facility] now that DrPatterson has retired; stable on antireflux regimen and  Nexium40/d...  DIVERTICULOSIS, COLON (ICD-562.10) - on LEVSIN 0.125mg  tid prn... ~  colonoscopy 10/00 showed divertics, no polyps. ~  GI f/u by [facility] 10/09- note reviewed... on CITRUCEL daily, & rec to take Phazyme for Gas. ~  [facility] 2/13 at [facility] by [facility] for Diverticulitis, treated w/ Cipro/ Flagyl & improved... ~  CT Abd&Pelvis 2/13 showed [place], s/p GB, kidney cysts, acute diverticulitis Hicks prox sigmoid (Rx by DrFagin [place]) & no complicating features...  RENAL INSUFFICIENCY (ICD-588.9) ~  labs 6/08 showed BUN= 43, Creat= 1.7 ~  labs 7/09 showed BUN= 35, Creat= 1.7 ~  labs 7/10 showed BUN= 51, Creat= 1.8 ~  labs 9/10 showed BUN= 33, Creat= 1.4 ~  labs 2/11 showed BUN= 32, Creat= 2.1, K= 4.6 ~  labs 8/11 showed BUN= 34, Creat= 1.5, K= 4.7 ~  labs 2/12 showed BUN= 37, creat= 1.5, K= 4.9 ~  Labs 2/13 showed BUN= 38, Creat= 1.5, K= 4.3 ~  Labs 2/14 showed BUN= 38, Creat= 1.4, K= 4.4 ~  Labs 3/15 showed BUN= 35, Cr= 1.6, K= 4.5 ~  Labs 8/16 showed BUN= 37, Cr= 1.6, K= 4.4  NEUROGENIC BLADDER (ICD-596.54) - eval and Rx by DrKimbrough [place] the past... prev had to self-cath regularly, & now voids satis on her own... still on MANDELAMINE for UTI prophylaxis per Urology.  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 also takes ALLOPURINOL 300mg /d for hyperuricemia... ~  labs 7/10 showed Uric= 4.3.Marland Kitchen. rec> continue Allopurinol. ~  labs 8/11 showed Uric= 3.8  OSTEOPENIA (ICD-733.90) - BMD from GYN= DrNeal w/ osteopenia Rx'd w/ ca++, MVI, VitD... ~  7/10: pt reports that DrNeal did VitD level & started her on Vit D 2000 u daily... ~  labs 2/11 showed Vit D level = 54... ~  Labs 2/13 showed Vit D level = 48... Continue supplement.  ANXIETY (ICD-300.00) - on ZOLOFT 100mg - 1/2 tab Qhs...  ANEMIA (ICD-285.9) - hx of intol to all  P O forms of iron  therapy... ~  labs 6/08 showed Hg= 11.1.Marland Kitchen. ~  labs 7/09 showed Hg= 10.3.Marland KitchenMarland Kitchen DrPatterson checked labs 10/09 w/ Fe= 77, Ferritin= 26, B12= 382. ~  labs 7/10 showed Hg= 11.0 ~  labs 2/11 showed Hg= 11.6 ~  labs 8/11 showed Hg= 11.9 ~  labs 2/12 showed Hg= 11.4 ~  Labs 2/13 showed Hg= 12.4, MCV= 96 ~  Labs 2/14 showed Hg= 11.5 ~  Labs 3/15 showed Hg= 11.9 ~  Labs 8/16 showed Hg= 11.7   Past Surgical History  Procedure Laterality Date  . Cholecystectomy    . Salivery glad tumor  80's    right  . Tonsillectomy    . Esophageal manometry  10/26/2011    Procedure: ESOPHAGEAL MANOMETRY (EM);  Surgeon: Sable Feil, MD;  Location: [facility];  Service: Endoscopy;  Laterality: N/A;  . Eye surgery Bilateral     cataracts  . Parotidectomy Left 11/14/2014  . Parotidectomy Left 11/14/2014    Procedure: LEFT LATERAL PAROTIDECTOMY;  Surgeon: Leta Baptist, MD;  Location: [place];  Service: ENT;  Laterality: Left;    Outpatient Encounter Prescriptions as of 12/04/2014  Medication Sig  . allopurinol (ZYLOPRIM) 300 MG tablet Take 300 mg by mouth daily.    Marland Kitchen amoxicillin (AMOXIL) 875 MG tablet Take 1 tablet (875 mg  total) by mouth 2 (two) times daily. (Patient not taking: Reported on 12/04/2014)  . Biotin 5000 MCG CAPS Take 1 capsule by mouth daily.  . Calcium Citrate-Vitamin D (CITRACAL MAXIMUM) 315-250 MG-UNIT TABS Take 1 capsule by mouth daily.  . chlorthalidone (HYGROTON) 25 MG tablet Take 12.5 mg by mouth daily.  . Cholecalciferol (VITAMIN D3) 2000 UNITS TABS Take 1 tablet by mouth daily.    Marland Kitchen diltiazem (CARDIZEM CD) 240 MG 24 hr capsule take 1 capsule by mouth once daily  . esomeprazole (NEXIUM) 40 MG capsule Take 40 mg by mouth daily before breakfast. Take one tablet by mouth twice a day  . fenofibrate 160 MG tablet take 1 tablet once daily  . fluticasone (FLONASE) 50 MCG/ACT nasal spray instill 2 sprays into each nostril once daily (Patient taking differently: instill 2 sprays into each nostril once  daily as needed)  . Fluticasone-Salmeterol (ADVAIR DISKUS) 100-50 MCG/DOSE AEPB inhale 1 dose by mouth twice a day  . Fluticasone-Salmeterol (ADVAIR DISKUS) 100-50 MCG/DOSE AEPB Inhale 1 puff into the lungs 2 (two) times daily.  . furosemide (LASIX) 20 MG tablet Take 1 tablet (20 mg total) by mouth daily.  Marland Kitchen losartan (COZAAR) 100 MG tablet take 1 tablet by mouth once daily  . methenamine (HIPREX) 1 G tablet Take 500 mg by mouth 2 (two) times daily with a meal.   . methylcellulose (CITRUCEL) oral powder Take 1 tsp by mouth daily   . Multiple Vitamin (MULITIVITAMIN WITH MINERALS) TABS Take 1 tablet by mouth daily.  Marland Kitchen oxyCODONE-acetaminophen (ROXICET) 5-325 MG per tablet Take 1-2 tablets by mouth every 4 (four) hours as needed for severe pain. (Patient not taking: Reported on 12/04/2014)  . Polyethyl Glycol-Propyl Glycol (SYSTANE ULTRA PF) 0.4-0.3 % SOLN Place 2 drops into both eyes 3 (three) times daily.   . potassium chloride SA (K-DUR,KLOR-CON) 20 MEQ tablet take 1 tablet by mouth once daily  . sertraline (ZOLOFT) 50 MG tablet Take 1 tablet (50 mg total) by mouth daily.  . simvastatin (ZOCOR) 10 MG tablet Take 10 mg by mouth at bedtime.    . VENTOLIN HFA 108 (90 BASE) MCG/ACT inhaler inhale 2 puffs by mouth every 6 hours AS NEEDED FOR WHEEZING  . warfarin (COUMADIN) 5 MG tablet Take 5 mg by mouth daily. 1/2 tablet on tuesdays, wednesdays, thursdays, fridays, saturdays and Sunday. 1 tablet on mondays  . [DISCONTINUED] Fluticasone-Salmeterol (ADVAIR DISKUS) 100-50 MCG/DOSE AEPB inhale 1 dose by mouth twice a day   No facility-administered encounter medications on file as of 12/04/2014.    Allergies  Allergen Reactions  . Metoclopramide Hcl Other (See Comments)    REACTION: causes uncontrolable leg movements  . Nitrofurantoin Nausea And Vomiting and Other (See Comments)    REACTION: dizziness    Current Medications, Allergies, Past Medical History, Past Surgical History, Family History, and  Social History were reviewed [place] [employer] record.    Review of Systems    See HPI - all other systems neg except as noted...  The patient complains of dyspnea on exertion.  The patient denies anorexia, fever, weight loss, weight gain, vision loss, decreased hearing, hoarseness, chest pain, syncope, peripheral edema, prolonged cough, headaches, hemoptysis, abdominal pain, melena, hematochezia, severe indigestion/heartburn, hematuria, incontinence, muscle weakness, suspicious skin lesions, transient blindness, difficulty walking, depression, unusual weight change, abnormal bleeding, enlarged lymph nodes, and angioedema.     Objective:   Physical Exam     WD, WN, 81 y/o WF Hicks NAD GENERAL:  Alert & oriented; pleasant & cooperative... HEENT:  [place]/AT, EOM-wnl, PERRLA, EACs-clear, TMs-wnl, NOSE-clear, THROAT-clear & wnl. NECK:  Supple w/ decrROM; no JVD; normal carotid impulses w/o bruits; no thyromegaly or nodules palpated; no lymphadenopathy. CHEST:  Clear to P & A; without wheezes/ rales/ or rhonchi. HEART:  regular rhythm; without murmurs/ rubs/ or gallops. ABDOMEN:  soft & nontender; normal bowel sounds; no organomegaly or masses detected. EXT: without deformities, mild arthritic changes; no varicose veins/ venous insuffic/ or edema. NEURO:  CN's intact;  no focal neuro deficits... DERM:  No lesions noted; no rash etc...  RADIOLOGY DATA:  Reviewed Hicks the EPIC EMR & discussed w/ the patient...  LABORATORY DATA:  Reviewed [place] the EPIC EMR & discussed w/ the patient...   Assessment & Plan:    ENT> left parotid nodule removed 8/16 by DrTeoh- final path is pending...  ASTHMA>  Stable on Advair, Ventolin; she is improved s/p cardio-pulm REHAB at [facility]... Encouraged to  Maintain exercise program on her own...  Hx DVT/ PTE/ Factor V defic>  Stable on Coumadin w/ protimes per DrFagan [place]; continue same...  HBP>  Controlled on Cardizem, Losartan, Lasix;  continue same...  CHOL>  Stable on Simva + Fenofib and diet;  Continue same...  LargeHH/ GERD>  On NexiumBid now & followed by DrPatterson; continue same meds & vigorous antireflux program; he is Hicks the process of further eval & referral to [facility]...  Divertics>  She had bout of acute diverticulitis 2/13 treated [facility]; symptoms resolved & now on maintenance program...  Renal Insuffic, Hx of Neurogenic Bladder>  Stable on current meds & voiding satis w/o need to self cath, she still takes Mandelamine for UTI prevention...  DJD, Hyperuricemia, DDD, Osteopenia>  She remains on Allopurinol, calcium, MVI, Vit D... We will set up appt w/ DrAlusio for 2nd opinion.  Anemia>  Hg remains stable at 11-12 range...  Anxiety/ Depression>  On ZOLOFT 100mg  taking 1/2 Qhs...   Patient's Medications  New Prescriptions   FLUTICASONE-SALMETEROL (ADVAIR DISKUS) 100-50 MCG/DOSE AEPB    Inhale 1 puff into the lungs 2 (two) times daily.  Previous Medications   ALLOPURINOL (ZYLOPRIM) 300 MG TABLET    Take 300 mg by mouth daily.     AMOXICILLIN (AMOXIL) 875 MG TABLET    Take 1 tablet (875 mg total) by mouth 2 (two) times daily.   BIOTIN 5000 MCG CAPS    Take 1 capsule by mouth daily.   CALCIUM CITRATE-VITAMIN D (CITRACAL MAXIMUM) 315-250 MG-UNIT TABS    Take 1 capsule by mouth daily.   CHLORTHALIDONE (HYGROTON) 25 MG TABLET    Take 12.5 mg by mouth daily.   CHOLECALCIFEROL (VITAMIN D3) 2000 UNITS TABS    Take 1 tablet by mouth daily.     DILTIAZEM (CARDIZEM CD) 240 MG 24 HR CAPSULE    take 1 capsule by mouth once daily   ESOMEPRAZOLE (NEXIUM) 40 MG CAPSULE    Take 40 mg by mouth daily before breakfast. Take one tablet by mouth twice a day   FENOFIBRATE 160 MG TABLET    take 1 tablet once daily   FLUTICASONE (FLONASE) 50 MCG/ACT NASAL SPRAY    instill 2 sprays into each nostril once daily   FUROSEMIDE (LASIX) 20 MG TABLET    Take 1 tablet (20 mg total) by mouth daily.   LOSARTAN (COZAAR) 100 MG  TABLET    take 1 tablet by mouth once daily   METHENAMINE (HIPREX)  1 G TABLET    Take 500 mg by mouth 2 (two) times daily with a meal.    METHYLCELLULOSE (CITRUCEL) ORAL POWDER    Take 1 tsp by mouth daily    MULTIPLE VITAMIN (MULITIVITAMIN WITH MINERALS) TABS    Take 1 tablet by mouth daily.   OXYCODONE-ACETAMINOPHEN (ROXICET) 5-325 MG PER TABLET    Take 1-2 tablets by mouth every 4 (four) hours as needed for severe pain.   POLYETHYL GLYCOL-PROPYL GLYCOL (SYSTANE ULTRA PF) 0.4-0.3 % SOLN    Place 2 drops into both eyes 3 (three) times daily.    POTASSIUM CHLORIDE SA (K-DUR,KLOR-CON) 20 MEQ TABLET    take 1 tablet by mouth once daily   SERTRALINE (ZOLOFT) 50 MG TABLET    Take 1 tablet (50 mg total) by mouth daily.   SIMVASTATIN (ZOCOR) 10 MG TABLET    Take 10 mg by mouth at bedtime.     VENTOLIN HFA 108 (90 BASE) MCG/ACT INHALER    inhale 2 puffs by mouth every 6 hours AS NEEDED FOR WHEEZING   WARFARIN (COUMADIN) 5 MG TABLET    Take 5 mg by mouth daily. 1/2 tablet on tuesdays, wednesdays, thursdays, fridays, saturdays and Sunday. 1 tablet on mondays  Modified Medications   Modified Medication Previous Medication   FLUTICASONE-SALMETEROL (ADVAIR DISKUS) 100-50 MCG/DOSE AEPB Fluticasone-Salmeterol (ADVAIR DISKUS) 100-50 MCG/DOSE AEPB      inhale 1 dose by mouth twice a day    inhale 1 dose by mouth twice a day  Discontinued Medications   No medications on file

## 2014-12-07 ENCOUNTER — Encounter (HOSPITAL_COMMUNITY): Payer: Self-pay

## 2014-12-11 ENCOUNTER — Encounter (HOSPITAL_COMMUNITY): Payer: Self-pay

## 2014-12-12 ENCOUNTER — Encounter (HOSPITAL_COMMUNITY): Payer: Medicare Other | Attending: Hematology & Oncology | Admitting: Hematology & Oncology

## 2014-12-12 VITALS — BP 133/58 | HR 70 | Temp 98.5°F | Resp 18 | Ht 60.0 in | Wt 134.6 lb

## 2014-12-12 DIAGNOSIS — C8590 Non-Hodgkin lymphoma, unspecified, unspecified site: Secondary | ICD-10-CM

## 2014-12-12 NOTE — Patient Instructions (Signed)
[place] at [facility] Discharge Instructions  RECOMMENDATIONS MADE BY THE CONSULTANT AND ANY TEST RESULTS WILL BE SENT TO YOUR REFERRING PHYSICIAN.  We will get you scheduled for a PET scan. This is only done at [facility]. ([place] TO TEST) MD appointment to follow PET scan. Return as scheduled.  Thank you for choosing [place] at [facility] to provide your oncology and hematology care.  To afford each patient quality time with our provider, please arrive at least 15 minutes before your scheduled appointment time.    You need to re-schedule your appointment should you arrive 10 or more minutes late.  We strive to give you quality time with our providers, and arriving late affects you and other patients whose appointments are after yours.  Also, if you no show three or more times for appointments you may be dismissed from the clinic at the providers discretion.     Again, thank you for choosing [facility].  Our hope is that these requests will decrease the amount of time that you wait before being seen by our physicians.       _____________________________________________________________  Should you have questions after your visit to [facility], please contact our office at [phone] between the hours of 8:30 a.m. and 4:30 p.m.  Voicemails left after 4:30 p.m. will not be returned until the following business day.  For prescription refill requests, have your pharmacy contact our office.

## 2014-12-12 NOTE — Progress Notes (Signed)
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 Benjamine Mola  HISTORY OF PRESENTING ILLNESS:  Nancy Hicks 81 y.o. 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 Nancy Hicks states that thirty or so years ago, she had a "mass" on the right side of her face. She notes she had this surgically removed but it was benign. She noticed the mass on the left side of her face a month/a few months ago, when it started causing her pain. Doctor Benjamine Mola removed the growth, and she has the paper of his findings with her today.She has several questions written down for discussion  She denies feeling bad, having night sweats or weight loss. She confirms that the pain seems to have dissipated since removal of the mass. She says she really doesn't have any symptoms that she feels or knows of.  Nancy Hicks says she stays busy, and mostly goes out to eat. She has her group of church friends with whom she socializes. She enjoys growing flowers, but says she's had to cut back recently. She gardens both outdoors and indoors.  She lives alone. Her children both live about 30 minutes away from her.  Nancy Hicks states that she gets mammograms in [place] with Dr. Nori Riis. She has been referred to the breast center before.  She had a pulmonary embolism, and has a history of asthma. When asked about her arthritis, she says "they said I'm worn out."  She confirms having a healthy appetite and regular bowels.  MEDICAL HISTORY:  Past Medical History  Diagnosis Date  . Unspecified asthma(493.90)   . Personal history of venous thrombosis and  embolism   . HTN (hypertension)   . Personal history of thrombophlebitis   . Pure hypercholesterolemia   . Esophageal reflux   . Diverticulosis of colon (without mention of hemorrhage) 01/13/1999  . Irritable bowel syndrome   . Neurogenic bladder   . Osteoarthrosis, unspecified whether generalized or localized, unspecified site   . Degeneration of cervical intervertebral disc   . Anxiety state, unspecified   . Anemia, unspecified   . Esophageal stricture   . Factor V deficiency   . Hiatal hernia 04/09/2010  . Raynaud phenomenon   . Factor V deficiency   . Disorder of bone and cartilage, unspecified   . Peripheral vascular disease     pe, dvt hx  . Shortness of breath dyspnea   . Unspecified disorder resulting from impaired renal function     cysts  . Cancer     skin  . Blood dyscrasia     RV factor def    SURGICAL HISTORY: Past Surgical History  Procedure Laterality Date  . Cholecystectomy    . Salivery glad tumor  80's    right  . Tonsillectomy    . Esophageal manometry  10/26/2011    Procedure: ESOPHAGEAL MANOMETRY (EM);  Surgeon: Sable Feil, MD;  Location: [facility];  Service: Endoscopy;  Laterality: N/A;  . Eye surgery Bilateral     cataracts  . Parotidectomy Left 11/14/2014  . Parotidectomy Left 11/14/2014    Procedure: LEFT LATERAL PAROTIDECTOMY;  Surgeon: Leta Baptist, MD;  Location: [facility]  OR;  Service: ENT;  Laterality: Left;    SOCIAL HISTORY: Social History   Social History  . Marital Status: Widowed    Spouse Name: N/A  . Number of Children: 2  . Years of Education: N/A   Occupational History  . retired   .     Social History Main Topics  . Smoking status: Never Smoker   . Smokeless tobacco: Never Used  . Alcohol Use: No  . Drug Use: No  . Sexual Activity: Not on file   Other Topics Concern  . Not on file   Social History Narrative   She was born in [place], and grew up in [place], [place] Widowed since 84; he had early-onset  Alzheimer's in his forties. She has two children; seven grandchildren, and 1 great grandchild and 1 on the way. When asked if she was a smoker, she says "not really." She was never a heavy drinker She used to work as Restaurant manager, fast food  FAMILY HISTORY: Family History  Problem Relation Age of Onset  . Parkinsonism Mother   . COPD Brother   . Asthma Brother   . Lung cancer Brother   . Heart attack Brother   . Diabetes Paternal Aunt    Her father was in his early 80's when he passed; heart problems Mother in late 46's when she passed; she had Parkinson's Disease; no one else had it She has two twin brothers; she says they are healthy, but elderly; 3 years younger than her.  has no family status information on file.   ALLERGIES:  is allergic to metoclopramide hcl and nitrofurantoin.  MEDICATIONS:  Current Outpatient Prescriptions  Medication Sig Dispense Refill  . allopurinol (ZYLOPRIM) 300 MG tablet Take 300 mg by mouth daily.      . Biotin 5000 MCG CAPS Take 1 capsule by mouth daily.    . Calcium Citrate-Vitamin D (CITRACAL MAXIMUM) 315-250 MG-UNIT TABS Take 1 capsule by mouth daily.    . chlorthalidone (HYGROTON) 25 MG tablet Take 12.5 mg by mouth daily.    . Cholecalciferol (VITAMIN D3) 2000 UNITS TABS Take 1 tablet by mouth daily.      Marland Kitchen diltiazem (CARDIZEM CD) 240 MG 24 hr capsule take 1 capsule by mouth once daily 90 capsule 3  . esomeprazole (NEXIUM) 40 MG capsule Take 40 mg by mouth daily before breakfast. Take one tablet by mouth twice a day    . fenofibrate 160 MG tablet take 1 tablet once daily 90 tablet 3  . fluticasone (FLONASE) 50 MCG/ACT nasal spray instill 2 sprays into each nostril once daily (Patient taking differently: instill 2 sprays into each nostril once daily as needed) 16 g 5  . Fluticasone-Salmeterol (ADVAIR DISKUS) 100-50 MCG/DOSE AEPB inhale 1 dose by mouth twice a day 60 each 6  . furosemide (LASIX) 20 MG tablet Take 1 tablet (20 mg total) by mouth daily.  90 tablet 1  . methenamine (HIPREX) 1 G tablet Take 500 mg by mouth 2 (two) times daily with a meal.     . methylcellulose (CITRUCEL) oral powder Take 1 tsp by mouth daily     . Multiple Vitamin (MULITIVITAMIN WITH MINERALS) TABS Take 1 tablet by mouth daily.    Vladimir Faster Glycol-Propyl Glycol (SYSTANE ULTRA PF) 0.4-0.3 % SOLN Place 2 drops into both eyes 3 (three) times daily.     . potassium chloride SA (K-DUR,KLOR-CON) 20 MEQ tablet take 1 tablet by mouth once daily 90 tablet 0  . sertraline (  ZOLOFT) 50 MG tablet Take 1 tablet (50 mg total) by mouth daily. 90 tablet 1  . simvastatin (ZOCOR) 10 MG tablet Take 10 mg by mouth at bedtime.      . VENTOLIN HFA 108 (90 BASE) MCG/ACT inhaler inhale 2 puffs by mouth every 6 hours AS NEEDED FOR WHEEZING 18 Inhaler 2  . warfarin (COUMADIN) 5 MG tablet Take 5 mg by mouth daily. 1/2 tablet on tuesdays, wednesdays, thursdays, fridays, saturdays and Sunday. 1 tablet on mondays    . Fluticasone-Salmeterol (ADVAIR DISKUS) 100-50 MCG/DOSE AEPB Inhale 1 puff into the lungs 2 (two) times daily. 1 each 0  . losartan (COZAAR) 100 MG tablet take 1 tablet by mouth once daily 90 tablet 1  . oxyCODONE-acetaminophen (ROXICET) 5-325 MG per tablet Take 1-2 tablets by mouth every 4 (four) hours as needed for severe pain. (Patient not taking: Reported on 12/04/2014) 30 tablet 0   No current facility-administered medications for this visit.    Review of Systems  Constitutional: Negative.   HENT: Negative.        She states that it hurts when she opens her mouth, "all along her teeth and back into her neck." BUT notes it is better/improving since surgery  Eyes: Negative.   Respiratory: Negative.   Cardiovascular: Negative.   Gastrointestinal: Negative.   Genitourinary: Positive for frequency.  Musculoskeletal: Positive for myalgias and joint pain.       Hip and knee. Her knee bothers her more than her hip.  Skin: Negative.   Neurological: Negative.     Endo/Heme/Allergies: Negative.   Psychiatric/Behavioral: Negative.   All other systems reviewed and are negative.  14 point ROS was done and is otherwise as detailed above or in HPI    PHYSICAL EXAMINATION: ECOG PERFORMANCE STATUS: 1 - Symptomatic but completely ambulatory  Filed Vitals:   12/12/14 1512  BP: 133/58  Pulse: 70  Temp: 98.5 F (36.9 C)  Resp: 18   Filed Weights   12/12/14 1512  Weight: 134 lb 9.6 oz (61.054 kg)   Physical Exam  Constitutional: She is oriented to person, place, and time and well-developed, well-nourished, and in no distress.  Ambulates to the exam table, but with some difficulty.   HENT:  Head: Normocephalic and atraumatic.  Nose: Nose normal.  Mouth/Throat: Oropharynx is clear and moist. No oropharyngeal exudate.  Eyes: Conjunctivae and EOM are normal. Pupils are equal, round, and reactive to light. Right eye exhibits no discharge. Left eye exhibits no discharge. No scleral icterus.  Neck: Normal range of motion. Neck supple. No tracheal deviation present. No thyromegaly present.  Well-healing resection on her left side of neck.  Cardiovascular: Normal rate, regular rhythm and normal heart sounds.  Exam reveals no gallop and no friction rub.   No murmur heard. Pulmonary/Chest: Effort normal and breath sounds normal. She has no wheezes. She has no rales.  Bowel sounds can be heard in her chest.  Abdominal: Soft. Bowel sounds are normal. She exhibits no distension and no mass. There is no tenderness. There is no rebound and no guarding.  Musculoskeletal: Normal range of motion. She exhibits no edema.  Evidence of osteoarthritis of the hands  Lymphadenopathy:    She has no cervical adenopathy.  Neurological: She is alert and oriented to person, place, and time. She has normal reflexes. No cranial nerve deficit. Gait normal. Coordination normal.  Skin: Skin is warm and dry. No rash noted.  Psychiatric: Mood, memory, affect and judgment normal.   Nursing  note and vitals reviewed.   LABORATORY DATA:  I have reviewed the data as listed Lab Results  Component Value Date   WBC 5.9 11/06/2014   HGB 11.7* 11/06/2014   HCT 36.1 11/06/2014   MCV 96.0 11/06/2014   PLT 279 11/06/2014     RADIOGRAPHIC STUDIES: I have personally reviewed the radiological images as listed and agreed with the findings in the report.  Study Result     CLINICAL DATA: Left neck mass. History of parotid tumor removal 30 years ago.  EXAM: CT NECK WITHOUT CONTRAST  TECHNIQUE: Multidetector CT imaging of the neck was performed following the standard protocol without intravenous contrast.  COMPARISON: None.  FINDINGS: Intravenous contrast not administered due to renal insufficiency creatinine 1.6 GFR 27  Palpable abnormality in the left neck corresponds to a left parotid nodule measuring 11 mm. This is a solid nodule in the anterior left parotid gland. Margins are ill-defined. Imaging findings suspicious for malignancy and biopsy is recommended. Remainder of the left parotid gland is fatty replaced. There has been partial resection of the right parotid gland. Atrophic submandibular glands bilaterally  The tongue and pharynx are normal. Larynx is normal.  Mild thyroid goiter with heterogeneous thyroid bilaterally. Benign calcification left thyroid.  No adenopathy in the neck aside from the left parotid lesion.  Moderate cervical spondylosis. No focal bony lesion.  Lung apices show patchy ground-glass density bilaterally which may be due to chronic lung disease versus edema.  IMPRESSION: 11 mm solid nodule left anterior parotid gland worrisome for malignancy. Biopsy recommended. This could be a parotid malignancy or possibly a metastatic lymph node.  Post surgical resection most of the right parotid gland. Marked atrophy of the submandibular gland bilaterally.  Thyroid goiter.   Electronically Signed  By:  Franchot Gallo M.D.  On: 09/05/2014 15:30        PATHOLOGY: ADDITIONAL INFORMATION: Immunoglobulin heavy chain gene rearrangement was performed on a representative block at the [facility] in [place], [place]. The analysis is positive for a clonal immunoglobulin heavy locus (IgH) gene rearrangement as detected by DNA amplification using consensus primers to the Heavy locus variable (framework II and framework III) and joining regions. In addition, FISH analysis for BCL-2 and BCL-6 performed on the same paraffin embedded tissue are negative. The overall findings are consistent with involvement by a low grade non Hodgkin's B cell lymphoma, favor marginal zone type. Clinical correlation is recommended. (BNS:gt, 12/10/14) Susanne Greenhouse MD Pathologist, Electronic Signature ( Signed 12/10/2014) FINAL DIAGNOSIS Diagnosis Parotid gland, left mass - ATYPICAL LYMPHOID PROLIFERATION. - SEE COMMENT. Microscopic Comment The sections show a dense and relatively monomorphic lymphoid proliferation mostly composed of small round to slightly irregular lymphocytes with high nuclear cytoplasmic ratio, dense chromatin and small to inconspicuous nuclei intermixed with numerous epithelial, mostly ductal elements displaying prominent lymphoepithelial lesions. The latter are often surrounded by paler zones of slightly larger lymphocytes with moderately abundant cytoplasm and small nucleoli imparting a monocytoid appearance. In this background, scattered reactive appearing germinal centers are present. A sample for flow cytometric analysis is not available. Hence immunohistochemical stains were performed and show that the lymphoid cells are predominantly composed of B cells including lymphoepithelial lesions as highlighted by CD20 and CD79a admixed with T cells to a lesser extent as highlighted with CD3, CD43, and CD5. B-cell areas do not show any significant staining for BCL-6, CD10,  CD5 or cyclin D1. CD10 and BCL-6 highlight scattered previously described germinal centers which are BCL2 negative.  CD21 shows numerous variably sized dendritic networks, representing residual and/or effaced germinal centers. CD138 highlights a minor plasma cell component that appears to show a polyclonal staining pattern for kappa and lambda light chains. Given the overall morphologic and 1 of 2 FINAL for CLEONA, DOUBLEDAY ([id number]) Microscopic Comment(continued) immunophenotypic features, the findings are worrisome for involvement by low grade marginal zone lymphoma (MALT). Since the differential diagnosis includes benign lymphoepithelial lesion, gene rearrangement study for immunoglobulin heavy chain gene and FISH will be performed to further evaluate this lesion and the results reported in an addendum. 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 Benjamine Mola  I spent time today reviewing non-Hodgkin's lymphoma with the patient. We discussed the difference between indolent disease or low-grade disease versus high-grade or aggressive disease. I discussed getting additional imaging and based upon that imaging we will make recommendations for further therapy.  I advised the patient that since she is asymptomatic; if her disease was confined to the parotid it may be reasonable to move forward with observation only. In addition we could get a radiation consultation to see if radiation would be reasonable to prevent local recurrence. I advised her that treatment of lymphomas are typically with lower dose radiation and it may be well tolerated.  If she has evidence of systemic disease, because she is  asymptomatic we can continue with observation. Other options include single agent Rituxan. We discussed other treatment options as well.  Plan will be for additional imaging, laboratory studies today, and follow-up with me after her PET/CT for additional discussion and recommendations.  Orders Placed This Encounter  Procedures  . NM PET Image Initial (PI) Skull Base To Thigh    Standing Status: Future     Number of Occurrences:      Standing Expiration Date: 12/12/2015    Order Specific Question:  Reason for Exam (SYMPTOM  OR DIAGNOSIS REQUIRED)    Answer:  newly diagnosed NHL parotid gland, staging    Order Specific Question:  Preferred imaging location?    Answer:  [facility]    All questions were answered. The patient knows to call the clinic with any problems, questions or concerns.  This document serves as a record of services personally performed by Ancil Linsey, MD. It was created on her behalf by Toni Amend, a trained medical scribe. The creation of this record is based on the scribe's personal observations and the provider's statements to them. This document has been checked and approved by the attending provider.  I have reviewed the above documentation for accuracy and completeness, and I agree with the above.  This note was electronically signed.    Molli Hazard, MD  12/12/2014 4:25 PM

## 2014-12-19 ENCOUNTER — Encounter (HOSPITAL_COMMUNITY): Payer: Self-pay | Admitting: Hematology & Oncology

## 2014-12-21 ENCOUNTER — Ambulatory Visit (HOSPITAL_COMMUNITY)
Admission: RE | Admit: 2014-12-21 | Discharge: 2014-12-21 | Disposition: A | Discharge status: Home / Self Care | Payer: Medicare Other | Source: Ambulatory Visit | Attending: Hematology & Oncology | Admitting: Hematology & Oncology

## 2014-12-21 DIAGNOSIS — K449 Diaphragmatic hernia without obstruction or gangrene: Secondary | ICD-10-CM | POA: Insufficient documentation

## 2014-12-21 DIAGNOSIS — I2510 Atherosclerotic heart disease of native coronary artery without angina pectoris: Secondary | ICD-10-CM | POA: Insufficient documentation

## 2014-12-21 DIAGNOSIS — I700 Atherosclerosis of aorta: Secondary | ICD-10-CM | POA: Diagnosis not present

## 2014-12-21 DIAGNOSIS — C8590 Non-Hodgkin lymphoma, unspecified, unspecified site: Secondary | ICD-10-CM | POA: Diagnosis not present

## 2014-12-21 DIAGNOSIS — I517 Cardiomegaly: Secondary | ICD-10-CM | POA: Diagnosis not present

## 2014-12-21 LAB — GLUCOSE, CAPILLARY: GLUCOSE-CAPILLARY: 110 mg/dL — AB (ref 65–99)

## 2014-12-21 MED ORDER — FLUDEOXYGLUCOSE F - 18 (FDG) INJECTION
6.7100 | Freq: Once | INTRAVENOUS | Status: DC | PRN
  Administered 2014-12-21: 6.71 via INTRAVENOUS
  Filled 2014-12-21: qty 6.71

## 2014-12-25 ENCOUNTER — Encounter (HOSPITAL_COMMUNITY): Payer: Self-pay | Admitting: Hematology & Oncology

## 2014-12-25 ENCOUNTER — Encounter (HOSPITAL_BASED_OUTPATIENT_CLINIC_OR_DEPARTMENT_OTHER): Payer: Medicare Other | Admitting: Hematology & Oncology

## 2014-12-25 VITALS — BP 156/72 | HR 80 | Temp 98.5°F | Resp 16 | Wt 134.0 lb

## 2014-12-25 DIAGNOSIS — C8580 Other specified types of non-Hodgkin lymphoma, unspecified site: Secondary | ICD-10-CM

## 2014-12-25 NOTE — Progress Notes (Signed)
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 Benjamine Hicks  HISTORY OF PRESENTING ILLNESS:  Nancy Hicks 81 y.o. 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 [name] states that thirty or so years ago, she had a "mass" on the right side of her face. She notes she had this surgically removed but it was benign. She noticed the mass on the left side of her face a month/a few months ago, when it started causing her pain. Doctor Nancy Hicks removed the growth, and she has the paper of his findings with her today.She has several questions written down for discussion  She denies feeling bad, having night sweats or weight loss. She confirms that the pain seems to have dissipated since removal of the mass. She says she really doesn't have any symptoms that she feels or knows of.  [name] says she stays busy, and mostly goes out to eat. She has her group of church friends with whom she socializes.  She is here today with her daughter.  The patient was prepared with questions after reading the material provided for her.  She has concerns of "giving out" and being tired easily.  She does note that this may be to her age.  She has not had a colonoscopy recently.  Her GI is Dr. Carlton Hicks at [facility].  She sees her regularly for a large hiatal hernia.  She is here today to review PET/CT imaging and discuss additional treatment recommendations.  MEDICAL HISTORY:  Past Medical History  Diagnosis Date  . Unspecified asthma(493.90)   . Personal history of venous thrombosis and embolism   . HTN (hypertension)   . Personal history  of thrombophlebitis   . Pure hypercholesterolemia   . Esophageal reflux   . Diverticulosis of colon (without mention of hemorrhage) 01/13/1999  . Irritable bowel syndrome   . Neurogenic bladder   . Osteoarthrosis, unspecified whether generalized or localized, unspecified site   . Degeneration of cervical intervertebral disc   . Anxiety state, unspecified   . Anemia, unspecified   . Esophageal stricture   . Factor V deficiency   . Hiatal hernia 04/09/2010  . Raynaud phenomenon   . Factor V deficiency   . Disorder of bone and cartilage, unspecified   . Peripheral vascular disease     pe, dvt hx  . Shortness of breath dyspnea   . Unspecified disorder resulting from impaired renal function     cysts  . Cancer     skin  . Blood dyscrasia     RV factor def    SURGICAL HISTORY: Past Surgical History  Procedure Laterality Date  . Cholecystectomy    . Salivery glad tumor  80's    right  . Tonsillectomy    . Esophageal manometry  10/26/2011    Procedure: ESOPHAGEAL MANOMETRY (EM);  Surgeon: Nancy Feil, MD;  Location: [facility];  Service: Endoscopy;  Laterality: N/A;  . Eye surgery Bilateral     cataracts  . Parotidectomy Left 11/14/2014  . Parotidectomy Left 11/14/2014    Procedure: LEFT LATERAL PAROTIDECTOMY;  Surgeon: Nancy Baptist, MD;  Location: [facility];  Service: ENT;  Laterality: Left;    SOCIAL HISTORY: Social History   Social History  . Marital Status: Widowed    Spouse Name: N/A  . Number of Children: 2  . Years of Education: N/A   Occupational History  . retired   .     Social History Main Topics  . Smoking status: Never Smoker   . Smokeless tobacco: Never Used  . Alcohol Use: No  . Drug Use: No  . Sexual Activity: Not on file   Other Topics Concern  . Not on file   Social History Narrative   She was born in [place], and grew up in [place], [place] Widowed since 84; he had early-onset Alzheimer's in his forties. She has two children; seven  grandchildren, and 1 great grandchild and 1 on the way. When asked if she was a smoker, she says "not really." She was never a heavy drinker She used to work as Restaurant manager, fast food  FAMILY HISTORY: Family History  Problem Relation Age of Onset  . Parkinsonism Mother   . COPD Brother   . Asthma Brother   . Lung cancer Brother   . Heart attack Brother   . Diabetes Paternal Aunt    Her father was in his early 25's when he passed; heart problems Mother in late 87's when she passed; she had Parkinson's Disease; no one else had it She has two twin brothers; she says they are healthy, but elderly; 3 years younger than her.  has no family status information on file.   ALLERGIES:  is allergic to metoclopramide hcl and nitrofurantoin.  MEDICATIONS:  Current Outpatient Prescriptions  Medication Sig Dispense Refill  . allopurinol (ZYLOPRIM) 300 MG tablet Take 300 mg by mouth daily.      . Biotin 5000 MCG CAPS Take 1 capsule by mouth daily.    . Calcium Citrate-Vitamin D (CITRACAL MAXIMUM) 315-250 MG-UNIT TABS Take 1 capsule by mouth daily.    . chlorthalidone (HYGROTON) 25 MG tablet Take 12.5 mg by mouth daily.    . Cholecalciferol (VITAMIN D3) 2000 UNITS TABS Take 1 tablet by mouth daily.      Marland Kitchen diltiazem (CARDIZEM CD) 240 MG 24 hr capsule take 1 capsule by mouth once daily 90 capsule 3  . esomeprazole (NEXIUM) 40 MG capsule Take 40 mg by mouth daily before breakfast. Take one tablet by mouth twice a day    . fenofibrate 160 MG tablet take 1 tablet once daily 90 tablet 3  . fluticasone (FLONASE) 50 MCG/ACT nasal spray instill 2 sprays into each nostril once daily (Patient taking differently: instill 2 sprays into each nostril once daily as needed) 16 g 5  . Fluticasone-Salmeterol (ADVAIR DISKUS) 100-50 MCG/DOSE AEPB inhale 1 dose by mouth twice a day 60 each 6  . furosemide (LASIX) 20 MG tablet Take 1 tablet (20 mg total) by mouth daily. 90 tablet 1  . losartan (COZAAR) 100 MG tablet take 1  tablet by mouth once daily 90 tablet 1  . methenamine (HIPREX) 1 G tablet Take 500 mg by mouth 2 (two) times daily with a meal.     . methylcellulose (CITRUCEL) oral powder Take 1 tsp by mouth daily     . Multiple Vitamin (MULITIVITAMIN WITH MINERALS) TABS Take 1 tablet by mouth daily.    Vladimir Faster Glycol-Propyl Glycol (SYSTANE ULTRA PF) 0.4-0.3 % SOLN Place 2 drops into both eyes 3 (three) times daily.     . potassium chloride SA (K-DUR,KLOR-CON) 20 MEQ tablet take  1 tablet by mouth once daily 90 tablet 0  . sertraline (ZOLOFT) 50 MG tablet Take 1 tablet (50 mg total) by mouth daily. 90 tablet 1  . simvastatin (ZOCOR) 10 MG tablet Take 10 mg by mouth at bedtime.      . VENTOLIN HFA 108 (90 BASE) MCG/ACT inhaler inhale 2 puffs by mouth every 6 hours AS NEEDED FOR WHEEZING 18 Inhaler 2  . warfarin (COUMADIN) 2.5 MG tablet Take 2.5 mg by mouth daily. Takes 2.5 mg on Mondays and Thursdays and 1/2 tablet on Tuesdays, Wednesdays, Fridays, Saturdays and Sundays     No current facility-administered medications for this visit.   Facility-Administered Medications Ordered in Other Visits  Medication Dose Route Frequency Provider Last Rate Last Dose  . fludeoxyglucose F - 18 (FDG) injection 6.71 milli Curie  6.71 milli Curie Intravenous Once PRN Medication Radiologist, MD   6.71 milli Curie at 12/21/14 1101    Review of Systems  Constitutional: Negative.   HENT: Negative.        She states that it hurts when she opens her mouth, "all along her teeth and back into her neck." BUT notes it is better/improving since surgery  Eyes: Negative.   Respiratory: Negative.   Cardiovascular: Negative.   Gastrointestinal: Negative.   Genitourinary: Positive for frequency.  Musculoskeletal: Positive for myalgias and joint pain.       Hip and knee. Her knee bothers her more than her hip.  Skin: Negative.   Neurological: Negative.   Endo/Heme/Allergies: Negative.   Psychiatric/Behavioral: Negative.   All  other systems reviewed and are negative.  14 point ROS was done and is otherwise as detailed above or in HPI   PHYSICAL EXAMINATION: ECOG PERFORMANCE STATUS: 1 - Symptomatic but completely ambulatory  Filed Vitals:   12/25/14 0839  BP: 156/72  Pulse: 80  Temp: 98.5 F (36.9 C)  Resp: 16   Filed Weights   12/25/14 0839  Weight: 134 lb (60.782 kg)   Physical Exam  Constitutional: She is oriented to person, place, and time and well-developed, well-nourished, and in no distress.  Ambulates to the exam table, but with some difficulty.   HENT:  Head: Normocephalic and atraumatic.  Nose: Nose normal.  Mouth/Throat: Oropharynx is clear and moist. No oropharyngeal exudate.  Eyes: Conjunctivae and EOM are normal. Pupils are equal, round, and reactive to light. Right eye exhibits no discharge. Left eye exhibits no discharge. No scleral icterus.  Neck: Normal range of motion. Neck supple. No tracheal deviation present. No thyromegaly present.  Well-healing resection on her left side of neck.  Cardiovascular: Normal rate, regular rhythm and normal heart sounds.  Exam reveals no gallop and no friction rub.   No murmur heard. Pulmonary/Chest: Effort normal and breath sounds normal. She has no wheezes. She has no rales.  Bowel sounds can be heard in her chest.  Abdominal: Soft. Bowel sounds are normal. She exhibits no distension and no mass. There is no tenderness. There is no rebound and no guarding.  Musculoskeletal: Normal range of motion. She exhibits no edema.  Evidence of osteoarthritis of the hands  Lymphadenopathy:    She has no cervical adenopathy.  Neurological: She is alert and oriented to person, place, and time. She has normal reflexes. No cranial nerve deficit. Gait normal. Coordination normal.  Skin: Skin is warm and dry. No rash noted.  Psychiatric: Mood, memory, affect and judgment normal.  Nursing note and vitals reviewed.   LABORATORY DATA:  I have reviewed  the data  as listed Lab Results  Component Value Date   WBC 5.9 11/06/2014   HGB 11.7* 11/06/2014   HCT 36.1 11/06/2014   MCV 96.0 11/06/2014   PLT 279 11/06/2014     RADIOGRAPHIC STUDIES: I have personally reviewed the radiological images as listed and agreed with the findings in the report. CLINICAL DATA: Initial treatment strategy for non-Hodgkin's lymphoma.  EXAM: NUCLEAR MEDICINE PET SKULL BASE TO THIGH  TECHNIQUE: 6.7 mCi F-18 FDG was injected intravenously. Full-ring PET imaging was performed from the skull base to thigh after the radiotracer. CT data was obtained and used for attenuation correction and anatomic localization.  FASTING BLOOD GLUCOSE: Value: 110 mg/dl IMPRESSION: 1. There appears to of been interval resection of the malignant lymph node in the anterior left parotid gland. No hypermetabolic adenopathy is identified within the neck, chest abdomen or pelvis. 2. There is a single focus of intense uptake within the sigmoid colon which is above activity in the remainder of the bowel. This is nonspecific and although it is conceivable this could represent physiologic activity, underlying hyperplastic polyp or small malignancy cannot be excluded in correlation with colon cancer screening techniques recommended. 3. Aortic atherosclerosis. 4. Cardiac enlargement 5. Hiatal hernia   Electronically Signed  By: Kerby Moors M.D.  On: 12/21/2014 13:31   PATHOLOGY: ADDITIONAL INFORMATION: Immunoglobulin heavy chain gene rearrangement was performed on a representative block at the [facility] in [place], [place]. The analysis is positive for a clonal immunoglobulin heavy locus (IgH) gene rearrangement as detected by DNA amplification using consensus primers to the Heavy locus variable (framework II and framework III) and joining regions. In addition, FISH analysis for BCL-2 and BCL-6 performed on the same paraffin embedded tissue are  negative. The overall findings are consistent with involvement by a low grade non Hodgkin's B cell lymphoma, favor marginal zone type. Clinical correlation is recommended. (BNS:gt, 12/10/14) Susanne Greenhouse MD Pathologist, Electronic Signature ( Signed 12/10/2014) FINAL DIAGNOSIS Diagnosis Parotid gland, left mass - ATYPICAL LYMPHOID PROLIFERATION. - SEE COMMENT. Microscopic Comment The sections show a dense and relatively monomorphic lymphoid proliferation mostly composed of small round to slightly irregular lymphocytes with high nuclear cytoplasmic ratio, dense chromatin and small to inconspicuous nuclei intermixed with numerous epithelial, mostly ductal elements displaying prominent lymphoepithelial lesions. The latter are often surrounded by paler zones of slightly larger lymphocytes with moderately abundant cytoplasm and small nucleoli imparting a monocytoid appearance. In this background, scattered reactive appearing germinal centers are present. A sample for flow cytometric analysis is not available. Hence immunohistochemical stains were performed and show that the lymphoid cells are predominantly composed of B cells including lymphoepithelial lesions as highlighted by CD20 and CD79a admixed with T cells to a lesser extent as highlighted with CD3, CD43, and CD5. B-cell areas do not show any significant staining for BCL-6, CD10, CD5 or cyclin D1. CD10 and BCL-6 highlight scattered previously described germinal centers which are BCL2 negative. CD21 shows numerous variably sized dendritic networks, representing residual and/or effaced germinal centers. CD138 highlights a minor plasma cell component that appears to show a polyclonal staining pattern for kappa and lambda light chains. Given the overall morphologic and 1 of 2 FINAL for CASONDRA, GASCA ([id number]) Microscopic Comment(continued) immunophenotypic features, the findings are worrisome for involvement by low grade marginal zone  lymphoma (MALT). Since the differential diagnosis includes benign lymphoepithelial lesion, gene rearrangement study for immunoglobulin heavy chain gene and FISH will be performed to further evaluate this lesion and  the results reported in an addendum. 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 Benjamine Hicks PET/CT on 12/21/2014 with interval resection of malignant lymph node in the anterior left parotid gland, no hypermetabolic adenopathy in the neck chest abdomen or pelvis Marcello Moores single focus of intense uptake within the sigmoid colon, nonspecific  The patient did a great job of reading the information she was provided regarding non-Hodgkin lymphoma. She had multiple questions today and we spent time discussing these. I reviewed her PET CT scans with her. I advised her that observation is my recommendation. Regarding her fatigue, this is subjective and she stated may be related to getting older. It is certainly something we will monitor moving forward.  I will send a note to Dr. Isidore Moos to see if she feels radiation would be warranted to prevent local recurrence.  In regards to the finding in the sigmoid colon, the patient has not had a recent colonoscopy. She has just seen her GI physician at [facility]. I advised her I will get a copy of her PET scan to her GI physician for additional recommendations.  RTC in 2 months. All questions were answered. The patient knows to call the clinic with any problems, questions or concerns.   This document serves as a record of services personally performed by Ancil Linsey, MD. It was created on her behalf by Janace Hoard, a trained medical scribe. The creation of  this record is based on the scribe's personal observations and the provider's statements to them. This document has been checked and approved by the attending provider.  I have reviewed the above documentation for accuracy and completeness, and I agree with the above.  This note was electronically signed.    Kelby Fam. Whitney Muse, MD

## 2014-12-25 NOTE — Patient Instructions (Signed)
[place] at [facility] Discharge Instructions  RECOMMENDATIONS MADE BY THE CONSULTANT AND ANY TEST RESULTS WILL BE SENT TO YOUR REFERRING PHYSICIAN.  Exam and discussion by Dr. Whitney Muse. PET results discussed. Dr. Whitney Muse will talk with Dr. Lanell Persons and your GI physician Call with any concerns. Patient navigator is Lupita Raider and she can be reached at [phone].  Follow-up in 2 months.  Thank you for choosing [place] at [facility] to provide your oncology and hematology care.  To afford each patient quality time with our provider, please arrive at least 15 minutes before your scheduled appointment time.    You need to re-schedule your appointment should you arrive 10 or more minutes late.  We strive to give you quality time with our providers, and arriving late affects you and other patients whose appointments are after yours.  Also, if you no show three or more times for appointments you may be dismissed from the clinic at the providers discretion.     Again, thank you for choosing [facility].  Our hope is that these requests will decrease the amount of time that you wait before being seen by our physicians.       _____________________________________________________________  Should you have questions after your visit to [facility], please contact our office at [phone] between the hours of 8:30 a.m. and 4:30 p.m.  Voicemails left after 4:30 p.m. will not be returned until the following business day.  For prescription refill requests, have your pharmacy contact our office.

## 2014-12-31 ENCOUNTER — Other Ambulatory Visit: Payer: Self-pay | Admitting: Obstetrics & Gynecology

## 2015-01-01 LAB — CYTOLOGY - PAP

## 2015-01-07 ENCOUNTER — Other Ambulatory Visit (HOSPITAL_COMMUNITY): Payer: Self-pay | Admitting: Internal Medicine

## 2015-01-07 DIAGNOSIS — Z1231 Encounter for screening mammogram for malignant neoplasm of breast: Secondary | ICD-10-CM

## 2015-01-16 ENCOUNTER — Ambulatory Visit (HOSPITAL_COMMUNITY)
Admission: RE | Admit: 2015-01-16 | Discharge: 2015-01-16 | Disposition: A | Discharge status: Home / Self Care | Payer: Medicare Other | Source: Ambulatory Visit | Attending: Internal Medicine | Admitting: Internal Medicine

## 2015-01-16 DIAGNOSIS — Z1231 Encounter for screening mammogram for malignant neoplasm of breast: Secondary | ICD-10-CM | POA: Diagnosis not present

## 2015-02-15 ENCOUNTER — Other Ambulatory Visit: Payer: Self-pay | Admitting: Pulmonary Disease

## 2015-02-25 ENCOUNTER — Encounter (HOSPITAL_COMMUNITY): Payer: Self-pay | Admitting: Hematology & Oncology

## 2015-02-25 ENCOUNTER — Encounter (HOSPITAL_COMMUNITY): Payer: Medicare Other | Attending: Hematology & Oncology | Admitting: Hematology & Oncology

## 2015-02-25 VITALS — BP 127/47 | HR 73 | Temp 98.7°F | Resp 16 | Wt 134.6 lb

## 2015-02-25 DIAGNOSIS — C8580 Other specified types of non-Hodgkin lymphoma, unspecified site: Secondary | ICD-10-CM

## 2015-02-25 NOTE — Progress Notes (Signed)
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 Benjamine Mola  HISTORY OF PRESENTING ILLNESS:  Nancy Hicks 81 y.o. 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  Nancy Hicks is here alone today. She remarks that she has no problems today that she knows of.  She repots that she's staying active, going out to eat, and having a good appetite. She is sleeping okay. She states that she lost five pounds over Thanksgiving because she cooked and she "never wants to eat when she cooks."  She denies any new problems with her face. She also denies any new lumps or bumps anywhere, saying "not to my knowledge."  All in all, she is in good spirits, chuckling often, and can't think of any complaints. She closes the appointment with "wait until I get home, I'll think of something."  She has had her flu shot this year.  MEDICAL HISTORY:  Past Medical History  Diagnosis Date  . Unspecified asthma(493.90)   . Personal history of venous thrombosis and embolism   . HTN (hypertension)   . Personal history of thrombophlebitis   . Pure hypercholesterolemia   . Esophageal reflux   . Diverticulosis of colon (without mention of hemorrhage) 01/13/1999  . Irritable bowel syndrome   . Neurogenic bladder   . Osteoarthrosis, unspecified whether generalized or localized, unspecified site   . Degeneration of cervical intervertebral disc   . Anxiety state, unspecified   . Anemia, unspecified   . Esophageal stricture   . Factor V deficiency ([place])   . Hiatal hernia 04/09/2010  . Raynaud phenomenon   . Factor V deficiency ([place])   .  Disorder of bone and cartilage, unspecified   . Peripheral vascular disease (HCC)     pe, dvt hx  . Shortness of breath dyspnea   . Unspecified disorder resulting from impaired renal function     cysts  . Cancer ([place])     skin  . Blood dyscrasia     RV factor def    SURGICAL HISTORY: Past Surgical History  Procedure Laterality Date  . Cholecystectomy    . Salivery glad tumor  80's    right  . Tonsillectomy    . Esophageal manometry  10/26/2011    Procedure: ESOPHAGEAL MANOMETRY (EM);  Surgeon: Sable Feil, MD;  Location: [facility];  Service: Endoscopy;  Laterality: N/A;  . Eye surgery Bilateral     cataracts  . Parotidectomy Left 11/14/2014  . Parotidectomy Left 11/14/2014    Procedure: LEFT LATERAL PAROTIDECTOMY;  Surgeon: Leta Baptist, MD;  Location: [facility];  Service: ENT;  Laterality: Left;    SOCIAL HISTORY: Social History   Social History  . Marital Status: Widowed    Spouse Name: N/A  . Number of Children: 2  . Years of Education: N/A   Occupational History  . retired   .     Social History Main Topics  . Smoking status: Never Smoker   . Smokeless tobacco: Never Used  . Alcohol Use: No  . Drug Use: No  . Sexual Activity: Not on file   Other Topics Concern  . Not on file  Social History Narrative   She was born in [place], and grew up in [place], [place] Widowed since 84; he had early-onset Alzheimer's in his forties. She has two children; seven grandchildren, and 1 great grandchild and 1 on the way. When asked if she was a smoker, she says "not really." She was never a heavy drinker She used to work as Restaurant manager, fast food  FAMILY HISTORY: Family History  Problem Relation Age of Onset  . Parkinsonism Mother   . COPD Brother   . Asthma Brother   . Lung cancer Brother   . Heart attack Brother   . Diabetes Paternal Aunt    Her father was in his early 2's when he passed; heart problems Mother in late 66's when she passed; she had  Parkinson's Disease; no one else had it She has two twin brothers; she says they are healthy, but elderly; 3 years younger than her.  has no family status information on file.   ALLERGIES:  is allergic to metoclopramide hcl and nitrofurantoin.  MEDICATIONS:  Current Outpatient Prescriptions  Medication Sig Dispense Refill  . allopurinol (ZYLOPRIM) 300 MG tablet Take 300 mg by mouth daily.      . Biotin 5000 MCG CAPS Take 1 capsule by mouth daily.    . Calcium Citrate-Vitamin D (CITRACAL MAXIMUM) 315-250 MG-UNIT TABS Take 1 capsule by mouth daily.    . chlorthalidone (HYGROTON) 25 MG tablet Take 12.5 mg by mouth daily.    . Cholecalciferol (VITAMIN D3) 2000 UNITS TABS Take 1 tablet by mouth daily.      Marland Kitchen diltiazem (CARDIZEM CD) 240 MG 24 hr capsule take 1 capsule by mouth once daily 90 capsule 3  . esomeprazole (NEXIUM) 40 MG capsule Take 40 mg by mouth daily before breakfast. Take one tablet by mouth twice a day    . fenofibrate 160 MG tablet take 1 tablet once daily 90 tablet 3  . fluticasone (FLONASE) 50 MCG/ACT nasal spray instill 2 sprays into each nostril once daily 16 g 5  . Fluticasone-Salmeterol (ADVAIR DISKUS) 100-50 MCG/DOSE AEPB inhale 1 dose by mouth twice a day 60 each 6  . furosemide (LASIX) 20 MG tablet Take 1 tablet (20 mg total) by mouth daily. 90 tablet 1  . losartan (COZAAR) 100 MG tablet take 1 tablet by mouth once daily 90 tablet 1  . methenamine (HIPREX) 1 G tablet Take 500 mg by mouth 2 (two) times daily with a meal.     . methylcellulose (CITRUCEL) oral powder Take 1 tsp by mouth daily     . Multiple Vitamin (MULITIVITAMIN WITH MINERALS) TABS Take 1 tablet by mouth daily.    Vladimir Faster Glycol-Propyl Glycol (SYSTANE ULTRA PF) 0.4-0.3 % SOLN Place 2 drops into both eyes 3 (three) times daily.     . potassium chloride SA (K-DUR,KLOR-CON) 20 MEQ tablet take 1 tablet by mouth once daily 90 tablet 0  . sertraline (ZOLOFT) 50 MG tablet Take 1 tablet (50 mg total) by mouth  daily. 90 tablet 1  . simvastatin (ZOCOR) 10 MG tablet Take 10 mg by mouth at bedtime.      Marland Kitchen warfarin (COUMADIN) 2.5 MG tablet Take 2.5 mg by mouth daily. Takes 2.5 mg on Mondays and Thursdays and 1/2 tablet on Tuesdays, Wednesdays, Fridays, Saturdays and Sundays    . VENTOLIN HFA 108 (90 BASE) MCG/ACT inhaler inhale 2 puffs by mouth every 6 hours AS NEEDED FOR WHEEZING (Patient not taking: Reported on 02/25/2015) 18 Inhaler 2  No current facility-administered medications for this visit.    Review of Systems  Constitutional: Negative.   HENT: Negative.   Eyes: Negative.   Respiratory: Negative.   Cardiovascular: Negative.   Gastrointestinal: Negative.   Genitourinary: Positive for frequency.  Musculoskeletal: Positive for myalgias and joint pain.       Hip and knee. Her knee bothers her more than her hip.  Skin: Negative.   Neurological: Negative.   Endo/Heme/Allergies: Negative.   Psychiatric/Behavioral: Negative.   All other systems reviewed and are negative.  14 point ROS was done and is otherwise as detailed above or in HPI   PHYSICAL EXAMINATION: ECOG PERFORMANCE STATUS: 1 - Symptomatic but completely ambulatory  Filed Vitals:   02/25/15 1254  BP: 127/47  Pulse: 73  Temp: 98.7 F (37.1 C)  Resp: 16   Filed Weights   02/25/15 1254  Weight: 134 lb 9.6 oz (61.054 kg)   Physical Exam  Constitutional: She is oriented to person, place, and time and well-developed, well-nourished, and in no distress.  Ambulates to the exam table, but with some difficulty.   HENT:  Head: Normocephalic and atraumatic.  Nose: Nose normal.  Mouth/Throat: Oropharynx is clear and moist. No oropharyngeal exudate.  Eyes: Conjunctivae and EOM are normal. Pupils are equal, round, and reactive to light. Right eye exhibits no discharge. Left eye exhibits no discharge. No scleral icterus.  Neck: Normal range of motion. Neck supple. No tracheal deviation present. No thyromegaly present.  Very  well healed resection on her left side of neck. small surgical defect but no swelling no erythema, incision site barely noticeable Cardiovascular: Normal rate, regular rhythm and normal heart sounds.  Exam reveals no gallop and no friction rub.   No murmur heard. Pulmonary/Chest: Effort normal and breath sounds normal. She has no wheezes. She has no rales.  Bowel sounds can be heard in her chest.  Abdominal: Soft. Bowel sounds are normal. She exhibits no distension and no mass. There is no tenderness. There is no rebound and no guarding.  Musculoskeletal: Normal range of motion. She exhibits no edema.  Evidence of osteoarthritis of the hands  Lymphadenopathy:    She has no cervical adenopathy.  Neurological: She is alert and oriented to person, place, and time. She has normal reflexes. No cranial nerve deficit. Gait normal. Coordination normal.  Skin: Skin is warm and dry. No rash noted.  Psychiatric: Mood, memory, affect and judgment normal.  Nursing note and vitals reviewed.   LABORATORY DATA:  I have reviewed the data as listed Lab Results  Component Value Date   WBC 5.9 11/06/2014   HGB 11.7* 11/06/2014   HCT 36.1 11/06/2014   MCV 96.0 11/06/2014   PLT 279 11/06/2014    Results for Nancy, Hicks (MRN [id number]) as of 02/25/2015 15:33  Ref. Range 11/06/2014 13:29  Sodium Latest Ref Range: 135-145 mmol/L 137  Potassium Latest Ref Range: 3.5-5.1 mmol/L 4.4  Chloride Latest Ref Range: 101-111 mmol/L 103  CO2 Latest Ref Range: 22-32 mmol/L 25  BUN Latest Ref Range: 6-20 mg/dL 37 (H)  Creatinine Latest Ref Range: 0.44-1.00 mg/dL 1.63 (H)  Calcium Latest Ref Range: 8.9-10.3 mg/dL 10.0  EGFR (Non-African Amer.) Latest Ref Range: >60 mL/min 27 (L)  EGFR (African American) Latest Ref Range: >60 mL/min 32 (L)  Glucose Latest Ref Range: 65-99 mg/dL 97  Anion gap Latest Ref Range: 5-15  9   RADIOGRAPHIC STUDIES: I have personally reviewed the radiological images as listed and  agreed with  the findings in the report. CLINICAL DATA: Initial treatment strategy for non-Hodgkin's lymphoma.  EXAM: NUCLEAR MEDICINE PET SKULL BASE TO THIGH  TECHNIQUE: 6.7 mCi F-18 FDG was injected intravenously. Full-ring PET imaging was performed from the skull base to thigh after the radiotracer. CT data was obtained and used for attenuation correction and anatomic localization.  FASTING BLOOD GLUCOSE: Value: 110 mg/dl IMPRESSION: 1. There appears to of been interval resection of the malignant lymph node in the anterior left parotid gland. No hypermetabolic adenopathy is identified within the neck, chest abdomen or pelvis. 2. There is a single focus of intense uptake within the sigmoid colon which is above activity in the remainder of the bowel. This is nonspecific and although it is conceivable this could represent physiologic activity, underlying hyperplastic polyp or small malignancy cannot be excluded in correlation with colon cancer screening techniques recommended. 3. Aortic atherosclerosis. 4. Cardiac enlargement 5. Hiatal hernia   Electronically Signed  By: Kerby Moors M.D.  On: 12/21/2014 13:31   PATHOLOGY: ADDITIONAL INFORMATION: Immunoglobulin heavy chain gene rearrangement was performed on a representative block at the [facility] in [place], [place]. The analysis is positive for a clonal immunoglobulin heavy locus (IgH) gene rearrangement as detected by DNA amplification using consensus primers to the Heavy locus variable (framework II and framework III) and joining regions. In addition, FISH analysis for BCL-2 and BCL-6 performed on the same paraffin embedded tissue are negative. The overall findings are consistent with involvement by a low grade non Hodgkin's B cell lymphoma, favor marginal zone type. Clinical correlation is recommended. (BNS:gt, 12/10/14) Susanne Greenhouse MD Pathologist, Electronic Signature ( Signed  12/10/2014) FINAL DIAGNOSIS Diagnosis Parotid gland, left mass - ATYPICAL LYMPHOID PROLIFERATION. - SEE COMMENT. Microscopic Comment The sections show a dense and relatively monomorphic lymphoid proliferation mostly composed of small round to slightly irregular lymphocytes with high nuclear cytoplasmic ratio, dense chromatin and small to inconspicuous nuclei intermixed with numerous epithelial, mostly ductal elements displaying prominent lymphoepithelial lesions. The latter are often surrounded by paler zones of slightly larger lymphocytes with moderately abundant cytoplasm and small nucleoli imparting a monocytoid appearance. In this background, scattered reactive appearing germinal centers are present. A sample for flow cytometric analysis is not available. Hence immunohistochemical stains were performed and show that the lymphoid cells are predominantly composed of B cells including lymphoepithelial lesions as highlighted by CD20 and CD79a admixed with T cells to a lesser extent as highlighted with CD3, CD43, and CD5. B-cell areas do not show any significant staining for BCL-6, CD10, CD5 or cyclin D1. CD10 and BCL-6 highlight scattered previously described germinal centers which are BCL2 negative. CD21 shows numerous variably sized dendritic networks, representing residual and/or effaced germinal centers. CD138 highlights a minor plasma cell component that appears to show a polyclonal staining pattern for kappa and lambda light chains. Given the overall morphologic and 1 of 2 FINAL for Nancy Hicks, Nancy Hicks ([id number]) Microscopic Comment(continued) immunophenotypic features, the findings are worrisome for involvement by low grade marginal zone lymphoma (MALT). Since the differential diagnosis includes benign lymphoepithelial lesion, gene rearrangement study for immunoglobulin heavy chain gene and FISH will be performed to further evaluate this lesion and the results reported in an  addendum. 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 Benjamine Mola PET/CT on 12/21/2014 with interval resection of malignant lymph node in the anterior left parotid gland, no hypermetabolic adenopathy in the neck chest abdomen or pelvis Nancy Hicks single focus of intense uptake within the sigmoid colon, nonspecific  She has no evidence of recurrence on exam.  She is overall doing quite well. We will continue with observation. She will return in 3 months with ongoing observation.  In regards to the finding in the sigmoid colon, the patient has not had a recent colonoscopy. She has not been back to her GI physician. I am going to refer her to her GI physician at [facility] today for follow-up asap.   All questions were answered. The patient knows to call the clinic with any problems, questions or concerns.   This document serves as a record of services personally performed by Ancil Linsey, MD. It was created on her behalf by Toni Amend, a trained medical scribe. The creation of this record is based on the scribe's personal observations and the provider's statements to them. This document has been checked and approved by the attending provider.  I have reviewed the above documentation for accuracy and completeness, and I agree with the above.  This note was electronically signed.    Kelby Fam. Whitney Muse, MD

## 2015-02-25 NOTE — Patient Instructions (Addendum)
[place] at [facility] Discharge Instructions  RECOMMENDATIONS MADE BY THE CONSULTANT AND ANY TEST RESULTS WILL BE SENT TO YOUR REFERRING PHYSICIAN.    Exam completed by Dr Whitney Muse today Referral to Dr Carlton Adam GI doctor in [facility], we are going to try to get this appt made for you. Appt Feb 16,2017 @ 2:30 pm  We are going to get a copy of your PET scan for you. Return to see the doctor in 3 months.   Please call the clinic if you have any questions or concerns.     Thank you for choosing [place] at [facility] to provide your oncology and hematology care.  To afford each patient quality time with our provider, please arrive at least 15 minutes before your scheduled appointment time.    You need to re-schedule your appointment should you arrive 10 or more minutes late.  We strive to give you quality time with our providers, and arriving late affects you and other patients whose appointments are after yours.  Also, if you no show three or more times for appointments you may be dismissed from the clinic at the providers discretion.     Again, thank you for choosing [facility].  Our hope is that these requests will decrease the amount of time that you wait before being seen by our physicians.       _____________________________________________________________  Should you have questions after your visit to [facility], please contact our office at [phone] between the hours of 8:30 a.m. and 4:30 p.m.  Voicemails left after 4:30 p.m. will not be returned until the following business day.  For prescription refill requests, have your pharmacy contact our office.

## 2015-03-31 HISTORY — PX: COLONOSCOPY: SHX174

## 2015-04-23 DIAGNOSIS — Z86711 Personal history of pulmonary embolism: Secondary | ICD-10-CM | POA: Insufficient documentation

## 2015-05-29 HISTORY — PX: OTHER SURGICAL HISTORY: SHX169

## 2015-06-04 ENCOUNTER — Encounter (HOSPITAL_COMMUNITY): Payer: Medicare Other | Attending: Hematology & Oncology | Admitting: Hematology & Oncology

## 2015-06-04 ENCOUNTER — Encounter (HOSPITAL_COMMUNITY): Payer: Medicare Other | Attending: Hematology & Oncology

## 2015-06-04 ENCOUNTER — Encounter (HOSPITAL_COMMUNITY): Payer: Self-pay | Admitting: Hematology & Oncology

## 2015-06-04 VITALS — BP 126/51 | HR 78 | Temp 98.0°F | Resp 18 | Wt 135.3 lb

## 2015-06-04 DIAGNOSIS — Z9049 Acquired absence of other specified parts of digestive tract: Secondary | ICD-10-CM | POA: Diagnosis not present

## 2015-06-04 DIAGNOSIS — I10 Essential (primary) hypertension: Secondary | ICD-10-CM | POA: Diagnosis not present

## 2015-06-04 DIAGNOSIS — Z79899 Other long term (current) drug therapy: Secondary | ICD-10-CM | POA: Insufficient documentation

## 2015-06-04 DIAGNOSIS — C8580 Other specified types of non-Hodgkin lymphoma, unspecified site: Secondary | ICD-10-CM | POA: Diagnosis present

## 2015-06-04 DIAGNOSIS — C8581 Other specified types of non-Hodgkin lymphoma, lymph nodes of head, face, and neck: Secondary | ICD-10-CM

## 2015-06-04 DIAGNOSIS — Z9889 Other specified postprocedural states: Secondary | ICD-10-CM | POA: Diagnosis not present

## 2015-06-04 LAB — CBC WITH DIFFERENTIAL/PLATELET
Basophils Absolute: 0 10*3/uL (ref 0.0–0.1)
Basophils Relative: 0 %
EOS ABS: 0.1 10*3/uL (ref 0.0–0.7)
EOS PCT: 1 %
HCT: 34.1 % — ABNORMAL LOW (ref 36.0–46.0)
Hemoglobin: 11.2 g/dL — ABNORMAL LOW (ref 12.0–15.0)
LYMPHS ABS: 1.9 10*3/uL (ref 0.7–4.0)
LYMPHS PCT: 27 %
MCH: 31.5 pg (ref 26.0–34.0)
MCHC: 32.8 g/dL (ref 30.0–36.0)
MCV: 96.1 fL (ref 78.0–100.0)
MONO ABS: 0.4 10*3/uL (ref 0.1–1.0)
Monocytes Relative: 6 %
Neutro Abs: 4.9 10*3/uL (ref 1.7–7.7)
Neutrophils Relative %: 66 %
PLATELETS: 289 10*3/uL (ref 150–400)
RBC: 3.55 MIL/uL — AB (ref 3.87–5.11)
RDW: 14.1 % (ref 11.5–15.5)
WBC: 7.3 10*3/uL (ref 4.0–10.5)

## 2015-06-04 LAB — COMPREHENSIVE METABOLIC PANEL
ALT: 17 U/L (ref 14–54)
ANION GAP: 8 (ref 5–15)
AST: 31 U/L (ref 15–41)
Albumin: 3.4 g/dL — ABNORMAL LOW (ref 3.5–5.0)
Alkaline Phosphatase: 68 U/L (ref 38–126)
BILIRUBIN TOTAL: 0.5 mg/dL (ref 0.3–1.2)
BUN: 38 mg/dL — ABNORMAL HIGH (ref 6–20)
CALCIUM: 9.5 mg/dL (ref 8.9–10.3)
CO2: 24 mmol/L (ref 22–32)
Chloride: 103 mmol/L (ref 101–111)
Creatinine, Ser: 1.4 mg/dL — ABNORMAL HIGH (ref 0.44–1.00)
GFR, EST AFRICAN AMERICAN: 38 mL/min — AB (ref >60)
GFR, EST NON AFRICAN AMERICAN: 33 mL/min — AB (ref >60)
Glucose, Bld: 111 mg/dL — ABNORMAL HIGH (ref 65–99)
Potassium: 4.7 mmol/L (ref 3.5–5.1)
SODIUM: 135 mmol/L (ref 135–145)
TOTAL PROTEIN: 6.7 g/dL (ref 6.5–8.1)

## 2015-06-04 LAB — LACTATE DEHYDROGENASE: LDH: 130 U/L (ref 98–192)

## 2015-06-04 NOTE — Patient Instructions (Signed)
..  [place] at [facility] Discharge Instructions  RECOMMENDATIONS MADE BY THE CONSULTANT AND ANY TEST RESULTS WILL BE SENT TO YOUR REFERRING PHYSICIAN.  Labs today  Return to see [place] as scheduled in 3  Months  We will call you with your lab work   Thank you for choosing [place] at [facility] to provide your oncology and hematology care.  To afford each patient quality time with our provider, please arrive at least 15 minutes before your scheduled appointment time.   Beginning January 23rd 2017 lab work for the [facility] will be done in the  Main lab at [facility] on 1st floor. If you have a lab appointment with the [facility] please come in thru the  Main Entrance and check in at the main information desk  You need to re-schedule your appointment should you arrive 10 or more minutes late.  We strive to give you quality time with our providers, and arriving late affects you and other patients whose appointments are after yours.  Also, if you no show three or more times for appointments you may be dismissed from the clinic at the providers discretion.     Again, thank you for choosing [facility].  Our hope is that these requests will decrease the amount of time that you wait before being seen by our physicians.       _____________________________________________________________  Should you have questions after your visit to [facility], please contact our office at [phone] between the hours of 8:30 a.m. and 4:30 p.m.  Voicemails left after 4:30 p.m. will not be returned until the following business day.  For prescription refill requests, have your pharmacy contact our office.         Resources For Cancer Patients and their Caregivers ? American Cancer Society: Can assist with transportation, wigs, general needs, runs Look Good Feel Better.        724 162 9612 ? Cancer Care: Provides financial  assistance, online support groups, medication/co-pay assistance.  1-800-813-HOPE 726-307-2523) ? [place] Assists [place] cancer patients and their families through emotional , educational and financial support.  705 695 9995 ? [place] DSS Where to apply for food stamps, Medicaid and utility assistance. 7432123972 ? [facility]: Transportation to medical appointments. 249-118-4832 ? Social Security Administration: May apply for disability if have a Stage IV cancer. 914-042-9200 2147845139 ? [employer], Disability and Transit Services: Assists with nutrition, care and transit needs. 620-494-1488

## 2015-06-04 NOTE — Progress Notes (Signed)
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 Benjamine Mola  HISTORY OF PRESENTING ILLNESS:  Nancy Hicks 82 y.o. 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  Nancy Hicks returns to the [facility] alone today.  She says she feels fine, no lumps or bumps that she knows of. She comments that her weight and appetite are good, and that she's keeping busy "as an 82 year old can."  Nancy Hicks does not have many concerns today.  Notably, she comments that it hurts to open and close her mouth.This pain opening her mouth started before she had her tumor removed. She has tried mucle relaxants before and notes they must made her sleepy.  She says that this "takes some of the joy out of eating." She confirms that Dr. Benjamine Mola thinks it's just arthritis.   She says that her breathing is good, and has even been better lately. She reports a little shortness of breath at times, if she walks, but nothing bad, and no wheezing. She says she doesn't believe she's any more limited than before when it comes to walking, but adds "I don't walk too much."  She did not receive bloodwork today, but is amenable to getting blood pulled today for cbc and cmp.  She is up to date on her mammogram.  FROM HER GI AT [facility] Visit 05/16/15:  Since Nancy Hicks's last evaluation, she has undergone a colonoscopy as she has had abnormal findings on her PET scan that was completed in 2016. Her PET scan was done for evaluation of her lymphoma and there was a small area in the sigmoid colon with abnormal PET uptake. As her last colonoscopy was  greater than 10 years ago a full colonoscopy was performed.  Nancy Hicks underwent a colonoscopy on 04/26/2015. She was found to have moderate diverticulosis in the sigmoid colon. There was a sessile polyp measuring 8-9 mm in size found in the sigmoid colon. Multiple biopsies were performed. There are also two smaller sessile polyps, 3 mm in size at the ileocecal valve. The polypectomy was performed using cold biopsy forceps. Retroflexion was within normal limits.  Subsequent pathology demonstrated the polyps on the IC valve were consistent with tubular adenomas. The colon polyp at 40 cm, was also a tubular adenoma. We have discussed with Nancy Hicks that she will need to be off of blood thinners in order to remove this polyp adequately. We will have a discussion with her primary care provider about the safety and the specific details of her anticoagulants prior to planned polypectomy.    MEDICAL HISTORY:  Past Medical History  Diagnosis Date  . Unspecified asthma(493.90)   . Personal history of venous thrombosis and embolism   . HTN (hypertension)   . Personal history of thrombophlebitis   . Pure hypercholesterolemia   . Esophageal reflux   . Diverticulosis of colon (without mention of hemorrhage) 01/13/1999  . Irritable bowel syndrome   . Neurogenic bladder   . Osteoarthrosis, unspecified whether generalized or localized, unspecified site   . Degeneration of cervical intervertebral disc   . Anxiety state, unspecified   . Anemia, unspecified   .  Esophageal stricture   . Factor V deficiency ([place])   . Hiatal hernia 04/09/2010  . Raynaud phenomenon   . Factor V deficiency ([place])   . Disorder of bone and cartilage, unspecified   . Peripheral vascular disease (HCC)     pe, dvt hx  . Shortness of breath dyspnea   . Unspecified disorder resulting from impaired renal function     cysts  . Cancer ([place])     skin  . Blood dyscrasia     RV factor def    SURGICAL HISTORY: Past  Surgical History  Procedure Laterality Date  . Cholecystectomy    . Salivery glad tumor  80's    right  . Tonsillectomy    . Esophageal manometry  10/26/2011    Procedure: ESOPHAGEAL MANOMETRY (EM);  Surgeon: Sable Feil, MD;  Location: [facility];  Service: Endoscopy;  Laterality: N/A;  . Eye surgery Bilateral     cataracts  . Parotidectomy Left 11/14/2014  . Parotidectomy Left 11/14/2014    Procedure: LEFT LATERAL PAROTIDECTOMY;  Surgeon: Leta Baptist, MD;  Location: [facility] OR;  Service: ENT;  Laterality: Left;    SOCIAL HISTORY: Social History   Social History  . Marital Status: Widowed    Spouse Name: N/A  . Number of Children: 2  . Years of Education: N/A   Occupational History  . retired   .     Social History Main Topics  . Smoking status: Never Smoker   . Smokeless tobacco: Never Used  . Alcohol Use: No  . Drug Use: No  . Sexual Activity: Not on file   Other Topics Concern  . Not on file   Social History Narrative   She was born in [place], and grew up in [place], [place] Widowed since 84; he had early-onset Alzheimer's in his forties. She has two children; seven grandchildren, and 1 great grandchild and 1 on the way. When asked if she was a smoker, she says "not really." She was never a heavy drinker She used to work as Restaurant manager, fast food  FAMILY HISTORY: Family History  Problem Relation Age of Onset  . Parkinsonism Mother   . COPD Brother   . Asthma Brother   . Lung cancer Brother   . Heart attack Brother   . Diabetes Paternal Aunt    Her father was in his early 41's when he passed; heart problems Mother in late 20's when she passed; she had Parkinson's Disease; no one else had it She has two twin brothers; she says they are healthy, but elderly; 3 years younger than her.  has no family status information on file.   ALLERGIES:  is allergic to metoclopramide hcl and nitrofurantoin.  MEDICATIONS:  Current Outpatient Prescriptions    Medication Sig Dispense Refill  . allopurinol (ZYLOPRIM) 300 MG tablet Take 300 mg by mouth daily.      . Biotin 5000 MCG CAPS Take 1 capsule by mouth daily.    . Calcium Citrate-Vitamin D (CITRACAL MAXIMUM) 315-250 MG-UNIT TABS Take 1 capsule by mouth daily.    . chlorthalidone (HYGROTON) 25 MG tablet Take 12.5 mg by mouth daily.    . Cholecalciferol (VITAMIN D3) 2000 UNITS TABS Take 1 tablet by mouth daily.      Marland Kitchen diltiazem (CARDIZEM CD) 240 MG 24 hr capsule take 1 capsule by mouth once daily 90 capsule 3  . esomeprazole (NEXIUM) 40 MG capsule Take 40 mg by mouth daily before breakfast. Take one tablet by mouth  twice a day    . fluticasone (FLONASE) 50 MCG/ACT nasal spray instill 2 sprays into each nostril once daily 16 g 5  . Fluticasone-Salmeterol (ADVAIR DISKUS) 100-50 MCG/DOSE AEPB inhale 1 dose by mouth twice a day 60 each 6  . furosemide (LASIX) 20 MG tablet Take 1 tablet (20 mg total) by mouth daily. 90 tablet 1  . losartan (COZAAR) 100 MG tablet take 1 tablet by mouth once daily 90 tablet 1  . methenamine (HIPREX) 1 G tablet Take 500 mg by mouth 2 (two) times daily with a meal.     . methylcellulose (CITRUCEL) oral powder Take 1 tsp by mouth daily     . Multiple Vitamin (MULITIVITAMIN WITH MINERALS) TABS Take 1 tablet by mouth daily.    Vladimir Faster Glycol-Propyl Glycol (SYSTANE ULTRA PF) 0.4-0.3 % SOLN Place 2 drops into both eyes 3 (three) times daily.     . potassium chloride SA (K-DUR,KLOR-CON) 20 MEQ tablet take 1 tablet by mouth once daily 90 tablet 0  . sertraline (ZOLOFT) 50 MG tablet Take 1 tablet (50 mg total) by mouth daily. 90 tablet 1  . simvastatin (ZOCOR) 10 MG tablet Take 10 mg by mouth at bedtime.      . VENTOLIN HFA 108 (90 BASE) MCG/ACT inhaler inhale 2 puffs by mouth every 6 hours AS NEEDED FOR WHEEZING 18 Inhaler 2  . warfarin (COUMADIN) 2.5 MG tablet Take 2.5 mg by mouth daily. Takes 2.5 mg on Mondays and Thursdays and 1/2 tablet on Tuesdays, Wednesdays, Fridays,  Saturdays and Sundays    . fenofibrate 160 MG tablet take 1 tablet once daily (Patient not taking: Reported on 06/04/2015) 90 tablet 3   No current facility-administered medications for this visit.    Review of Systems  Constitutional: Negative.   HENT: Negative.   Eyes: Negative.   Respiratory: Negative.   Cardiovascular: Negative.   Gastrointestinal: Negative.   Genitourinary: Positive for frequency.  Musculoskeletal: Positive for myalgias and joint pain.       Hip and knee. Her knee bothers her more than her hip.  Skin: Negative.   Neurological: Negative.   Endo/Heme/Allergies: Negative.   Psychiatric/Behavioral: Negative.   All other systems reviewed and are negative.  14 point ROS was done and is otherwise as detailed above or in HPI   PHYSICAL EXAMINATION: ECOG PERFORMANCE STATUS: 1 - Symptomatic but completely ambulatory  Filed Vitals:   06/04/15 1308  BP: 126/51  Pulse: 78  Temp: 98 F (36.7 C)  Resp: 18   Filed Weights   06/04/15 1308  Weight: 135 lb 4.8 oz (61.372 kg)   Physical Exam  Constitutional: She is oriented to person, place, and time and well-developed, well-nourished, and in no distress.  Ambulates to the exam table, but with some difficulty.  HENT:  Head: Normocephalic and atraumatic.  Nose: Nose normal.  Mouth/Throat: Oropharynx is clear and moist. No oropharyngeal exudate. Limited ROM in opening mouth. Eyes: Conjunctivae and EOM are normal. Pupils are equal, round, and reactive to light. Right eye exhibits no discharge. Left eye exhibits no discharge. No scleral icterus.  Neck: Normal range of motion. Neck supple. No tracheal deviation present. No thyromegaly present.  Very well healed resection on her left side of neck. small surgical defect but no swelling no erythema, incision site barely noticeable Cardiovascular: Normal rate, regular rhythm and normal heart sounds.  Exam reveals no gallop and no friction rub.   No murmur  heard. Pulmonary/Chest: Effort normal and breath sounds  normal. She has no wheezes. She has no rales.  Bowel sounds can be heard in her chest.  Abdominal: Soft. Bowel sounds are normal. She exhibits no distension and no mass. There is no tenderness. There is no rebound and no guarding.  Musculoskeletal: Normal range of motion. She exhibits no edema.  Evidence of osteoarthritis of the hands  Lymphadenopathy:    She has no cervical adenopathy.  Neurological: She is alert and oriented to person, place, and time. She has normal reflexes. No cranial nerve deficit. Gait normal. Coordination normal.  Skin: Skin is warm and dry. No rash noted.  Psychiatric: Mood, memory, affect and judgment normal.  Nursing note and vitals reviewed.   LABORATORY DATA:  I have reviewed the data as listed Lab Results  Component Value Date   WBC 5.9 11/06/2014   HGB 11.7* 11/06/2014   HCT 36.1 11/06/2014   MCV 96.0 11/06/2014   PLT 279 11/06/2014    Results for LARETA, BRUNEAU (MRN [id number]) as of 02/25/2015 15:33  Ref. Range 11/06/2014 13:29  Sodium Latest Ref Range: 135-145 mmol/L 137  Potassium Latest Ref Range: 3.5-5.1 mmol/L 4.4  Chloride Latest Ref Range: 101-111 mmol/L 103  CO2 Latest Ref Range: 22-32 mmol/L 25  BUN Latest Ref Range: 6-20 mg/dL 37 (H)  Creatinine Latest Ref Range: 0.44-1.00 mg/dL 1.63 (H)  Calcium Latest Ref Range: 8.9-10.3 mg/dL 10.0  EGFR (Non-African Amer.) Latest Ref Range: >60 mL/min 27 (L)  EGFR (African American) Latest Ref Range: >60 mL/min 32 (L)  Glucose Latest Ref Range: 65-99 mg/dL 97  Anion gap Latest Ref Range: 5-15  9   RADIOGRAPHIC STUDIES: I have personally reviewed the radiological images as listed and agreed with the findings in the report. CLINICAL DATA: Initial treatment strategy for non-Hodgkin's lymphoma.  EXAM: NUCLEAR MEDICINE PET SKULL BASE TO THIGH  TECHNIQUE: 6.7 mCi F-18 FDG was injected intravenously. Full-ring PET imaging was  performed from the skull base to thigh after the radiotracer. CT data was obtained and used for attenuation correction and anatomic localization.  FASTING BLOOD GLUCOSE: Value: 110 mg/dl IMPRESSION: 1. There appears to of been interval resection of the malignant lymph node in the anterior left parotid gland. No hypermetabolic adenopathy is identified within the neck, chest abdomen or pelvis. 2. There is a single focus of intense uptake within the sigmoid colon which is above activity in the remainder of the bowel. This is nonspecific and although it is conceivable this could represent physiologic activity, underlying hyperplastic polyp or small malignancy cannot be excluded in correlation with colon cancer screening techniques recommended. 3. Aortic atherosclerosis. 4. Cardiac enlargement 5. Hiatal hernia   Electronically Signed  By: Kerby Moors M.D.  On: 12/21/2014 13:31   PATHOLOGY: ADDITIONAL INFORMATION: Immunoglobulin heavy chain gene rearrangement was performed on a representative block at the [facility] in [place], [place]. The analysis is positive for a clonal immunoglobulin heavy locus (IgH) gene rearrangement as detected by DNA amplification using consensus primers to the Heavy locus variable (framework II and framework III) and joining regions. In addition, FISH analysis for BCL-2 and BCL-6 performed on the same paraffin embedded tissue are negative. The overall findings are consistent with involvement by a low grade non Hodgkin's B cell lymphoma, favor marginal zone type. Clinical correlation is recommended. (BNS:gt, 12/10/14) Susanne Greenhouse MD Pathologist, Electronic Signature ( Signed 12/10/2014) FINAL DIAGNOSIS Diagnosis Parotid gland, left mass - ATYPICAL LYMPHOID PROLIFERATION. - SEE COMMENT. Microscopic Comment The sections show a dense and relatively  monomorphic lymphoid proliferation mostly composed of small round  to slightly irregular lymphocytes with high nuclear cytoplasmic ratio, dense chromatin and small to inconspicuous nuclei intermixed with numerous epithelial, mostly ductal elements displaying prominent lymphoepithelial lesions. The latter are often surrounded by paler zones of slightly larger lymphocytes with moderately abundant cytoplasm and small nucleoli imparting a monocytoid appearance. In this background, scattered reactive appearing germinal centers are present. A sample for flow cytometric analysis is not available. Hence immunohistochemical stains were performed and show that the lymphoid cells are predominantly composed of B cells including lymphoepithelial lesions as highlighted by CD20 and CD79a admixed with T cells to a lesser extent as highlighted with CD3, CD43, and CD5. B-cell areas do not show any significant staining for BCL-6, CD10, CD5 or cyclin D1. CD10 and BCL-6 highlight scattered previously described germinal centers which are BCL2 negative. CD21 shows numerous variably sized dendritic networks, representing residual and/or effaced germinal centers. CD138 highlights a minor plasma cell component that appears to show a polyclonal staining pattern for kappa and lambda light chains. Given the overall morphologic and 1 of 2 FINAL for ANNITTA, FIFIELD ([id number]) Microscopic Comment(continued) immunophenotypic features, the findings are worrisome for involvement by low grade marginal zone lymphoma (MALT). Since the differential diagnosis includes benign lymphoepithelial lesion, gene rearrangement study for immunoglobulin heavy chain gene and FISH will be performed to further evaluate this lesion and the results reported in an addendum. 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 Benjamine Mola PET/CT on 12/21/2014 with interval resection of malignant lymph node in the anterior left parotid gland, no hypermetabolic adenopathy in the neck chest abdomen or pelvis Marcello Moores single focus of intense uptake within the sigmoid colon, nonspecific  She has no evidence of recurrence on exam.  She is overall doing quite well. We will continue with observation. She will return in 3 months with ongoing observation.  Nancy Hicks underwent a colonoscopy at [facility] that revealed several benign polyps, including one very large benign polyp.  She is scheduled for a sigmoidoscopy on 3/17 to remove the largest polyp. Her blood thinners are being managed by her PCP.    Orders Placed This Encounter  Procedures  . CBC with Differential    Standing Status: Future     Number of Occurrences: 1     Standing Expiration Date: 06/03/2016  . Comprehensive metabolic panel    Standing Status: Future     Number of Occurrences: 1     Standing Expiration Date: 06/03/2016  . Lactate dehydrogenase    Standing Status: Future     Number of Occurrences: 1     Standing Expiration Date: 06/03/2016  . CBC with Differential    Standing Status: Future     Number of Occurrences:      Standing Expiration Date: 06/03/2016  . Comprehensive metabolic panel    Standing Status: Future     Number of Occurrences:      Standing Expiration Date: 06/03/2016  . Lactate dehydrogenase    Standing Status: Future     Number of Occurrences:      Standing Expiration Date: 06/03/2016   All questions were answered. The patient knows to call the clinic with any problems, questions or concerns.   This  document serves as a record of services personally performed by Ancil Linsey, MD. It was created on her behalf by Toni Amend, a trained medical scribe. The creation of this record is based on the scribe's personal observations and the  provider's statements to them. This document has been checked and approved by the attending provider.  I have reviewed the above documentation for accuracy and completeness, and I agree with the above.  This note was electronically signed.    Kelby Fam. Whitney Muse, MD

## 2015-09-09 ENCOUNTER — Encounter (HOSPITAL_COMMUNITY): Payer: Medicare Other | Attending: Hematology & Oncology | Admitting: Hematology & Oncology

## 2015-09-09 ENCOUNTER — Encounter (HOSPITAL_COMMUNITY): Payer: Medicare Other

## 2015-09-09 VITALS — BP 118/52 | HR 67 | Temp 97.9°F | Resp 20 | Wt 135.2 lb

## 2015-09-09 DIAGNOSIS — Z888 Allergy status to other drugs, medicaments and biological substances status: Secondary | ICD-10-CM | POA: Insufficient documentation

## 2015-09-09 DIAGNOSIS — I10 Essential (primary) hypertension: Secondary | ICD-10-CM | POA: Insufficient documentation

## 2015-09-09 DIAGNOSIS — Z9889 Other specified postprocedural states: Secondary | ICD-10-CM | POA: Diagnosis not present

## 2015-09-09 DIAGNOSIS — E7800 Pure hypercholesterolemia, unspecified: Secondary | ICD-10-CM | POA: Diagnosis not present

## 2015-09-09 DIAGNOSIS — K219 Gastro-esophageal reflux disease without esophagitis: Secondary | ICD-10-CM | POA: Insufficient documentation

## 2015-09-09 DIAGNOSIS — C8589 Other specified types of non-Hodgkin lymphoma, extranodal and solid organ sites: Secondary | ICD-10-CM

## 2015-09-09 DIAGNOSIS — K449 Diaphragmatic hernia without obstruction or gangrene: Secondary | ICD-10-CM | POA: Insufficient documentation

## 2015-09-09 DIAGNOSIS — Z8579 Personal history of other malignant neoplasms of lymphoid, hematopoietic and related tissues: Secondary | ICD-10-CM | POA: Diagnosis not present

## 2015-09-09 DIAGNOSIS — C8300 Small cell B-cell lymphoma, unspecified site: Secondary | ICD-10-CM | POA: Diagnosis not present

## 2015-09-09 DIAGNOSIS — D682 Hereditary deficiency of other clotting factors: Secondary | ICD-10-CM | POA: Insufficient documentation

## 2015-09-09 DIAGNOSIS — M1990 Unspecified osteoarthritis, unspecified site: Secondary | ICD-10-CM | POA: Insufficient documentation

## 2015-09-09 DIAGNOSIS — K589 Irritable bowel syndrome without diarrhea: Secondary | ICD-10-CM | POA: Insufficient documentation

## 2015-09-09 DIAGNOSIS — I7300 Raynaud's syndrome without gangrene: Secondary | ICD-10-CM | POA: Insufficient documentation

## 2015-09-09 DIAGNOSIS — Z79899 Other long term (current) drug therapy: Secondary | ICD-10-CM | POA: Diagnosis not present

## 2015-09-09 DIAGNOSIS — Z86718 Personal history of other venous thrombosis and embolism: Secondary | ICD-10-CM | POA: Insufficient documentation

## 2015-09-09 DIAGNOSIS — C8580 Other specified types of non-Hodgkin lymphoma, unspecified site: Secondary | ICD-10-CM

## 2015-09-09 DIAGNOSIS — Z9049 Acquired absence of other specified parts of digestive tract: Secondary | ICD-10-CM | POA: Diagnosis not present

## 2015-09-09 DIAGNOSIS — J45909 Unspecified asthma, uncomplicated: Secondary | ICD-10-CM | POA: Diagnosis not present

## 2015-09-09 DIAGNOSIS — Z7901 Long term (current) use of anticoagulants: Secondary | ICD-10-CM | POA: Insufficient documentation

## 2015-09-09 LAB — COMPREHENSIVE METABOLIC PANEL
ALBUMIN: 3.5 g/dL (ref 3.5–5.0)
ALK PHOS: 80 U/L (ref 38–126)
ALT: 16 U/L (ref 14–54)
ANION GAP: 7 (ref 5–15)
AST: 27 U/L (ref 15–41)
BUN: 38 mg/dL — ABNORMAL HIGH (ref 6–20)
CO2: 21 mmol/L — ABNORMAL LOW (ref 22–32)
Calcium: 8.9 mg/dL (ref 8.9–10.3)
Chloride: 106 mmol/L (ref 101–111)
Creatinine, Ser: 1.02 mg/dL — ABNORMAL HIGH (ref 0.44–1.00)
GFR calc Af Amer: 56 mL/min — ABNORMAL LOW (ref >60)
GFR calc non Af Amer: 48 mL/min — ABNORMAL LOW (ref >60)
Glucose, Bld: 147 mg/dL — ABNORMAL HIGH (ref 65–99)
Potassium: 4 mmol/L (ref 3.5–5.1)
SODIUM: 134 mmol/L — AB (ref 135–145)
TOTAL PROTEIN: 6.2 g/dL — AB (ref 6.5–8.1)
Total Bilirubin: 0.6 mg/dL (ref 0.3–1.2)

## 2015-09-09 LAB — CBC WITH DIFFERENTIAL/PLATELET
BASOS ABS: 0 10*3/uL (ref 0.0–0.1)
BASOS PCT: 1 %
EOS ABS: 0 10*3/uL (ref 0.0–0.7)
Eosinophils Relative: 1 %
HEMATOCRIT: 33.8 % — AB (ref 36.0–46.0)
HEMOGLOBIN: 11.4 g/dL — AB (ref 12.0–15.0)
Lymphocytes Relative: 23 %
Lymphs Abs: 1.4 10*3/uL (ref 0.7–4.0)
MCH: 31.9 pg (ref 26.0–34.0)
MCHC: 33.7 g/dL (ref 30.0–36.0)
MCV: 94.7 fL (ref 78.0–100.0)
Monocytes Absolute: 0.4 10*3/uL (ref 0.1–1.0)
Monocytes Relative: 6 %
NEUTROS ABS: 4 10*3/uL (ref 1.7–7.7)
NEUTROS PCT: 69 %
Platelets: 181 10*3/uL (ref 150–400)
RBC: 3.57 MIL/uL — AB (ref 3.87–5.11)
RDW: 15 % (ref 11.5–15.5)
WBC: 5.8 10*3/uL (ref 4.0–10.5)

## 2015-09-09 LAB — LACTATE DEHYDROGENASE: LDH: 133 U/L (ref 98–192)

## 2015-09-09 NOTE — Progress Notes (Signed)
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 Benjamine Mola  HISTORY OF PRESENTING ILLNESS:  Nancy Hicks 82 y.o. 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  Nancy Hicks returns to the [facility] today unaccompanied. She is using her cane.  She confirms going back to [facility] and having the polyp removed. Nancy Hicks reports that "she got it all." The patient was told it was benign, but that if it hadn't been removed, it could have grown into something malignant. Nancy Hicks was not told whether or not she needs another colonoscopy in a year.  Nancy Hicks reports that, overall, she thinks she's been doing fine. She says that her jaw has been doing better; she's starting to chew and such, though it "still does a little bit." She notes that she "guesses" her neck is fine.  She confirms that she is still getting out, doing everything she wants to do. She denies any drenching night sweats and denies any new lumps or bumps, at least not that she's aware of.  When asked if there is anything else she's worried about, she asks "what are the risks of this coming back anywhere?"  She is able to ambulate to the table for her physical exam with minor assistance.  Appetite is baseline. No new pain. No significant change in energy level.    MEDICAL HISTORY:  Past Medical History  Diagnosis Date  . Unspecified asthma(493.90)   . Personal history of venous thrombosis and embolism   . HTN (hypertension)   . Personal history of thrombophlebitis   . Pure hypercholesterolemia   . Esophageal reflux   .  Diverticulosis of colon (without mention of hemorrhage) 01/13/1999  . Irritable bowel syndrome   . Neurogenic bladder   . Osteoarthrosis, unspecified whether generalized or localized, unspecified site   . Degeneration of cervical intervertebral disc   . Anxiety state, unspecified   . Anemia, unspecified   . Esophageal stricture   . Factor V deficiency ([place])   . Hiatal hernia 04/09/2010  . Raynaud phenomenon   . Factor V deficiency ([place])   . Disorder of bone and cartilage, unspecified   . Peripheral vascular disease (HCC)     pe, dvt hx  . Shortness of breath dyspnea   . Unspecified disorder resulting from impaired renal function     cysts  . Cancer ([place])     skin  . Blood dyscrasia     RV factor def    SURGICAL HISTORY: Past Surgical History  Procedure Laterality Date  . Cholecystectomy    . Salivery glad tumor  80's    right  . Tonsillectomy    . Esophageal manometry  10/26/2011    Procedure: ESOPHAGEAL MANOMETRY (EM);  Surgeon: Sable Feil, MD;  Location: [facility];  Service: Endoscopy;  Laterality: N/A;  . Eye surgery Bilateral     cataracts  . Parotidectomy Left 11/14/2014  . Parotidectomy Left 11/14/2014    Procedure: LEFT LATERAL PAROTIDECTOMY;  Surgeon: Leta Baptist, MD;  Location: [facility];  Service: ENT;  Laterality: Left;    SOCIAL HISTORY: Social History   Social History  .  Marital Status: Widowed    Spouse Name: N/A  . Number of Children: 2  . Years of Education: N/A   Occupational History  . retired   .     Social History Main Topics  . Smoking status: Never Smoker   . Smokeless tobacco: Never Used  . Alcohol Use: No  . Drug Use: No  . Sexual Activity: Not on file   Other Topics Concern  . Not on file   Social History Narrative   She was born in [place], and grew up in [place], [place] Widowed since 84; he had early-onset Alzheimer's in his forties. She has two children; seven grandchildren, and 1 great grandchild and 1 on the  way. When asked if she was a smoker, she says "not really." She was never a heavy drinker She used to work as Restaurant manager, fast food  FAMILY HISTORY: Family History  Problem Relation Age of Onset  . Parkinsonism Mother   . COPD Brother   . Asthma Brother   . Lung cancer Brother   . Heart attack Brother   . Diabetes Paternal Aunt    Her father was in his early 43's when he passed; heart problems Mother in late 35's when she passed; she had Parkinson's Disease; no one else had it She has two twin brothers; she says they are healthy, but elderly; 3 years younger than her.  has no family status information on file.   ALLERGIES:  is allergic to metoclopramide hcl and nitrofurantoin.  MEDICATIONS:  Current Outpatient Prescriptions  Medication Sig Dispense Refill  . allopurinol (ZYLOPRIM) 300 MG tablet Take 300 mg by mouth daily.      . Biotin 5000 MCG CAPS Take 1 capsule by mouth daily.    . Calcium Citrate-Vitamin D (CITRACAL MAXIMUM) 315-250 MG-UNIT TABS Take 1 capsule by mouth daily.    . chlorthalidone (HYGROTON) 25 MG tablet Take 12.5 mg by mouth daily.    . Cholecalciferol (VITAMIN D3) 2000 UNITS TABS Take 1 tablet by mouth daily.      Marland Kitchen diltiazem (CARDIZEM CD) 240 MG 24 hr capsule take 1 capsule by mouth once daily 90 capsule 3  . esomeprazole (NEXIUM) 40 MG capsule Take 40 mg by mouth daily before breakfast. Take one tablet by mouth twice a day    . fenofibrate 160 MG tablet take 1 tablet once daily 90 tablet 3  . fluticasone (FLONASE) 50 MCG/ACT nasal spray instill 2 sprays into each nostril once daily 16 g 5  . Fluticasone-Salmeterol (ADVAIR DISKUS) 100-50 MCG/DOSE AEPB inhale 1 dose by mouth twice a day 60 each 6  . furosemide (LASIX) 20 MG tablet Take 1 tablet (20 mg total) by mouth daily. 90 tablet 1  . losartan (COZAAR) 100 MG tablet take 1 tablet by mouth once daily 90 tablet 1  . methenamine (HIPREX) 1 G tablet Take 500 mg by mouth 2 (two) times daily with a meal.     .  methylcellulose (CITRUCEL) oral powder Take 1 tsp by mouth daily     . Multiple Vitamin (MULITIVITAMIN WITH MINERALS) TABS Take 1 tablet by mouth daily.    Vladimir Faster Glycol-Propyl Glycol (SYSTANE ULTRA PF) 0.4-0.3 % SOLN Place 2 drops into both eyes 3 (three) times daily.     . potassium chloride SA (K-DUR,KLOR-CON) 20 MEQ tablet take 1 tablet by mouth once daily 90 tablet 0  . sertraline (ZOLOFT) 50 MG tablet Take 1 tablet (50 mg total) by mouth daily. 90 tablet 1  .  simvastatin (ZOCOR) 10 MG tablet Take 10 mg by mouth at bedtime.      . VENTOLIN HFA 108 (90 BASE) MCG/ACT inhaler inhale 2 puffs by mouth every 6 hours AS NEEDED FOR WHEEZING 18 Inhaler 2  . warfarin (COUMADIN) 2.5 MG tablet Take 2.5 mg by mouth daily. Takes 2.5 mg everyday.     No current facility-administered medications for this visit.    Review of Systems  Constitutional: Negative.   HENT: Negative.   Eyes: Negative.   Respiratory: Negative.   Cardiovascular: Negative.   Gastrointestinal: Negative.   Genitourinary: Positive for frequency.  Musculoskeletal: Positive for myalgias and joint pain.       Hip and knee. Her knee bothers her more than her hip.  Skin: Negative.   Neurological: Negative.   Endo/Heme/Allergies: Negative.   Psychiatric/Behavioral: Negative.   All other systems reviewed and are negative.  14 point ROS was done and is otherwise as detailed above or in HPI   PHYSICAL EXAMINATION: ECOG PERFORMANCE STATUS: 1 - Symptomatic but completely ambulatory  Filed Vitals:   09/09/15 1300  BP: 118/52  Pulse: 67  Temp: 97.9 F (36.6 C)  Resp: 20   Filed Weights   09/09/15 1300  Weight: 135 lb 3.2 oz (61.326 kg)   Physical Exam  Constitutional: She is oriented to person, place, and time and well-developed, well-nourished, and in no distress.  Ambulates to the exam table, but with some difficulty.  HENT:  Head: Normocephalic and atraumatic.  Nose: Nose normal.  Mouth/Throat: Oropharynx is  clear and moist. No oropharyngeal exudate. Limited ROM in opening mouth. Eyes: Conjunctivae and EOM are normal. Pupils are equal, round, and reactive to light. Right eye exhibits no discharge. Left eye exhibits no discharge. No scleral icterus.  Neck: Normal range of motion. Neck supple. No tracheal deviation present. No thyromegaly present.  Very well healed resection on her left side of neck. small surgical defect but no swelling no erythema, incision site barely noticeable Cardiovascular: Normal rate, regular rhythm and normal heart sounds.  Exam reveals no gallop and no friction rub.   No murmur heard. Pulmonary/Chest: Effort normal and breath sounds normal. She has no wheezes. She has no rales.  Bowel sounds can be heard in her chest.  Abdominal: Soft. Bowel sounds are normal. She exhibits no distension and no mass. There is no tenderness. There is no rebound and no guarding.  Musculoskeletal: Normal range of motion. She exhibits no edema.  Evidence of osteoarthritis of the hands  Lymphadenopathy:    She has no cervical adenopathy.  Neurological: She is alert and oriented to person, place, and time. She has normal reflexes. No cranial nerve deficit. Gait normal. Coordination normal.  Skin: Skin is warm and dry. No rash noted.  Psychiatric: Mood, memory, affect and judgment normal.  Nursing note and vitals reviewed.   LABORATORY DATA:  I have reviewed the data as listed  Lab Results  Component Value Date   WBC 5.8 09/09/2015   HGB 11.4* 09/09/2015   HCT 33.8* 09/09/2015   MCV 94.7 09/09/2015   PLT 181 09/09/2015   CBC    Component Value Date/Time   WBC 5.8 09/09/2015 1230   RBC 3.57* 09/09/2015 1230   HGB 11.4* 09/09/2015 1230   HCT 33.8* 09/09/2015 1230   PLT 181 09/09/2015 1230   MCV 94.7 09/09/2015 1230   MCH 31.9 09/09/2015 1230   MCHC 33.7 09/09/2015 1230   RDW 15.0 09/09/2015 1230   LYMPHSABS 1.4 09/09/2015  1230   MONOABS 0.4 09/09/2015 1230   EOSABS 0.0  09/09/2015 1230   BASOSABS 0.0 09/09/2015 1230   CMP     Component Value Date/Time   NA 134* 09/09/2015 1230   K 4.0 09/09/2015 1230   CL 106 09/09/2015 1230   CO2 21* 09/09/2015 1230   GLUCOSE 147* 09/09/2015 1230   BUN 38* 09/09/2015 1230   CREATININE 1.02* 09/09/2015 1230   CALCIUM 8.9 09/09/2015 1230   PROT 6.2* 09/09/2015 1230   ALBUMIN 3.5 09/09/2015 1230   AST 27 09/09/2015 1230   ALT 16 09/09/2015 1230   ALKPHOS 80 09/09/2015 1230   BILITOT 0.6 09/09/2015 1230   GFRNONAA 48* 09/09/2015 1230   GFRAA 56* 09/09/2015 1230   RADIOGRAPHIC STUDIES: I have personally reviewed the radiological images as listed and agreed with the findings in the report. CLINICAL DATA: Initial treatment strategy for non-Hodgkin's lymphoma.  EXAM: NUCLEAR MEDICINE PET SKULL BASE TO THIGH  TECHNIQUE: 6.7 mCi F-18 FDG was injected intravenously. Full-ring PET imaging was performed from the skull base to thigh after the radiotracer. CT data was obtained and used for attenuation correction and anatomic localization.  FASTING BLOOD GLUCOSE: Value: 110 mg/dl IMPRESSION: 1. There appears to of been interval resection of the malignant lymph node in the anterior left parotid gland. No hypermetabolic adenopathy is identified within the neck, chest abdomen or pelvis. 2. There is a single focus of intense uptake within the sigmoid colon which is above activity in the remainder of the bowel. This is nonspecific and although it is conceivable this could represent physiologic activity, underlying hyperplastic polyp or small malignancy cannot be excluded in correlation with colon cancer screening techniques recommended. 3. Aortic atherosclerosis. 4. Cardiac enlargement 5. Hiatal hernia   Electronically Signed  By: Kerby Moors M.D.  On: 12/21/2014 13:31   PATHOLOGY: ADDITIONAL INFORMATION: Immunoglobulin heavy chain gene rearrangement was performed on a representative block at the  [facility] in [place], [place]. The analysis is positive for a clonal immunoglobulin heavy locus (IgH) gene rearrangement as detected by DNA amplification using consensus primers to the Heavy locus variable (framework II and framework III) and joining regions. In addition, FISH analysis for BCL-2 and BCL-6 performed on the same paraffin embedded tissue are negative. The overall findings are consistent with involvement by a low grade non Hodgkin's B cell lymphoma, favor marginal zone type. Clinical correlation is recommended. (BNS:gt, 12/10/14) Susanne Greenhouse MD Pathologist, Electronic Signature ( Signed 12/10/2014) FINAL DIAGNOSIS Diagnosis Parotid gland, left mass - ATYPICAL LYMPHOID PROLIFERATION. - SEE COMMENT. Microscopic Comment The sections show a dense and relatively monomorphic lymphoid proliferation mostly composed of small round to slightly irregular lymphocytes with high nuclear cytoplasmic ratio, dense chromatin and small to inconspicuous nuclei intermixed with numerous epithelial, mostly ductal elements displaying prominent lymphoepithelial lesions. The latter are often surrounded by paler zones of slightly larger lymphocytes with moderately abundant cytoplasm and small nucleoli imparting a monocytoid appearance. In this background, scattered reactive appearing germinal centers are present. A sample for flow cytometric analysis is not available. Hence immunohistochemical stains were performed and show that the lymphoid cells are predominantly composed of B cells including lymphoepithelial lesions as highlighted by CD20 and CD79a admixed with T cells to a lesser extent as highlighted with CD3, CD43, and CD5. B-cell areas do not show any significant staining for BCL-6, CD10, CD5 or cyclin D1. CD10 and BCL-6 highlight scattered previously described germinal centers which are BCL2 negative. CD21 shows numerous variably sized  dendritic networks, representing  residual and/or effaced germinal centers. CD138 highlights a minor plasma cell component that appears to show a polyclonal staining pattern for kappa and lambda light chains. Given the overall morphologic and 1 of 2 FINAL for SVEA, PUSCH ([id number]) Microscopic Comment(continued) immunophenotypic features, the findings are worrisome for involvement by low grade marginal zone lymphoma (MALT). Since the differential diagnosis includes benign lymphoepithelial lesion, gene rearrangement study for immunoglobulin heavy chain gene and FISH will be performed to further evaluate this lesion and the results reported in an addendum. 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 Benjamine Mola PET/CT on 12/21/2014 with interval resection of malignant lymph node in the anterior left parotid gland, no hypermetabolic adenopathy in the neck chest abdomen or pelvis Marcello Moores single focus of intense uptake within the sigmoid colon, nonspecific  She has no evidence of recurrence on exam.  She is overall doing quite well. We will continue with observation. She will return in 4 months with ongoing observation.  Nancy Hicks underwent a colonoscopy at [facility] that revealed several benign polyps, including one very large benign polyp.  She has completed removal with her GI physician at [facility].  Follow-up of her marginal zone lymphoma in accordance with NCCN guidelines:  1. Clinical follow-up every 3 to 6 months for 5 years and then yearly  2. Follow-up includes diagnostic tests and imaging previously used as clinically indicated 3. Observation may be considered for patients whose diagnostic biopsy was excisional or where RT could  result in significant morbidity 4. Surgical excision for adequate diagnosis may be appropriate treatment for disease  I would like to reimage her prior to follow-up and if WNL we can consider moving her visits out to 6 month intervals. We reviewed all of the above today.  Orders Placed This Encounter  Procedures  . CT Abdomen W Contrast    Standing Status: Future     Number of Occurrences:      Standing Expiration Date: 09/08/2016    Order Specific Question:  If indicated for the ordered procedure, I authorize the administration of contrast media per Radiology protocol    Answer:  Yes    Order Specific Question:  Reason for Exam (SYMPTOM  OR DIAGNOSIS REQUIRED)    Answer:  restaging NHL    Order Specific Question:  Preferred imaging location?    Answer:  [facility]  . CT Chest W Contrast    Standing Status: Future     Number of Occurrences:      Standing Expiration Date: 09/08/2016    Order Specific Question:  If indicated for the ordered procedure, I authorize the administration of contrast media per Radiology protocol    Answer:  Yes    Order Specific Question:  Reason for Exam (SYMPTOM  OR DIAGNOSIS REQUIRED)    Answer:  restaging NHL    Order Specific Question:  Preferred imaging location?    Answer:  [facility]  . CT Soft Tissue Neck W Contrast    Standing Status: Future     Number of Occurrences:      Standing Expiration Date: 12/09/2016    Order Specific Question:  If indicated for the ordered procedure, I authorize the administration of contrast media  per Radiology protocol    Answer:  Yes    Order Specific Question:  Reason for Exam (SYMPTOM  OR DIAGNOSIS REQUIRED)    Answer:  restaging NHL    Order Specific Question:  Preferred imaging location?    Answer:  [facility] - Closed   All questions were answered. The patient knows to call the clinic with any problems, questions or concerns.   This document serves as a record of services personally performed  by Ancil Linsey, MD. It was created on her behalf by Toni Amend, a trained medical scribe. The creation of this record is based on the scribe's personal observations and the provider's statements to them. This document has been checked and approved by the attending provider.  I have reviewed the above documentation for accuracy and completeness, and I agree with the above.  This note was electronically signed.    Kelby Fam. Whitney Muse, MD

## 2015-09-09 NOTE — Patient Instructions (Signed)
[place] at [facility] Discharge Instructions  RECOMMENDATIONS MADE BY THE CONSULTANT AND ANY TEST RESULTS WILL BE SENT TO YOUR REFERRING PHYSICIAN.  Return in 4 months to see Dr. Whitney Muse  Scans prior to seeing Dr. Whitney Muse   Thank you for choosing [place] at [facility] to provide your oncology and hematology care.  To afford each patient quality time with our provider, please arrive at least 15 minutes before your scheduled appointment time.   Beginning January 23rd 2017 lab work for the [facility] will be done in the  Main lab at [facility] on 1st floor. If you have a lab appointment with the [facility] please come in thru the  Main Entrance and check in at the main information desk  You need to re-schedule your appointment should you arrive 10 or more minutes late.  We strive to give you quality time with our providers, and arriving late affects you and other patients whose appointments are after yours.  Also, if you no show three or more times for appointments you may be dismissed from the clinic at the providers discretion.     Again, thank you for choosing [facility].  Our hope is that these requests will decrease the amount of time that you wait before being seen by our physicians.       _____________________________________________________________  Should you have questions after your visit to [facility], please contact our office at [phone] between the hours of 8:30 a.m. and 4:30 p.m.  Voicemails left after 4:30 p.m. will not be returned until the following business day.  For prescription refill requests, have your pharmacy contact our office.         Resources For Cancer Patients and their Caregivers ? American Cancer Society: Can assist with transportation, wigs, general needs, runs Look Good Feel Better.        630-184-8857 ? Cancer Care: Provides financial assistance, online  support groups, medication/co-pay assistance.  1-800-813-HOPE 867-449-4510) ? [place] Assists [place] cancer patients and their families through emotional , educational and financial support.  937 455 9081 ? [place] DSS Where to apply for food stamps, Medicaid and utility assistance. 437-132-2698 ? [facility]: Transportation to medical appointments. 518-474-3094 ? Social Security Administration: May apply for disability if have a Stage IV cancer. 405-297-8332 734-500-2699 ? [employer], Disability and Transit Services: Assists with nutrition, care and transit needs. [place] Support Programs: @10RELATIVEDAYS @ > Cancer Support Group  2nd Tuesday of the month 1pm-2pm, Journey Room  > Creative Journey  3rd Tuesday of the month 1130am-1pm, Journey Room  > Look Good Feel Better  1st Wednesday of the month 10am-12 noon, Journey Room (Call [place] to register [phone])

## 2015-09-10 ENCOUNTER — Encounter (HOSPITAL_COMMUNITY): Payer: Self-pay | Admitting: Hematology & Oncology

## 2015-10-17 ENCOUNTER — Ambulatory Visit (INDEPENDENT_AMBULATORY_CARE_PROVIDER_SITE_OTHER): Payer: Medicare Other | Admitting: Otolaryngology

## 2015-10-17 DIAGNOSIS — H6123 Impacted cerumen, bilateral: Secondary | ICD-10-CM

## 2015-10-17 DIAGNOSIS — C07 Malignant neoplasm of parotid gland: Secondary | ICD-10-CM

## 2015-12-04 ENCOUNTER — Ambulatory Visit (INDEPENDENT_AMBULATORY_CARE_PROVIDER_SITE_OTHER): Payer: Medicare Other | Admitting: Pulmonary Disease

## 2015-12-04 ENCOUNTER — Encounter: Payer: Self-pay | Admitting: Pulmonary Disease

## 2015-12-04 VITALS — BP 130/60 | HR 68 | Temp 97.3°F | Ht 60.0 in | Wt 136.0 lb

## 2015-12-04 DIAGNOSIS — K2100 Gastro-esophageal reflux disease with esophagitis, without bleeding: Secondary | ICD-10-CM

## 2015-12-04 DIAGNOSIS — J449 Chronic obstructive pulmonary disease, unspecified: Secondary | ICD-10-CM

## 2015-12-04 DIAGNOSIS — K449 Diaphragmatic hernia without obstruction or gangrene: Secondary | ICD-10-CM

## 2015-12-04 DIAGNOSIS — Z86718 Personal history of other venous thrombosis and embolism: Secondary | ICD-10-CM

## 2015-12-04 DIAGNOSIS — Z7901 Long term (current) use of anticoagulants: Secondary | ICD-10-CM

## 2015-12-04 DIAGNOSIS — Z8672 Personal history of thrombophlebitis: Secondary | ICD-10-CM

## 2015-12-04 MED ORDER — FLUTICASONE-SALMETEROL 100-50 MCG/DOSE IN AEPB: INHALATION_SPRAY | RESPIRATORY_TRACT | 11 refills | Status: DC

## 2015-12-04 NOTE — Progress Notes (Signed)
Subjective:    Patient ID: Nancy [facility], female    DOB: [birth year], 82 y.o.   MRN: [id number]  HPI 83 y/o WF here for a follow up visit... she has multiple medical problems as noted below & is followed both here and in [facility]...  ~  SEE PREV EPIC NOTES FOR OLDER DATA >>    LABS 2/14:  FLP- at goals on simva10+Feno160;  Chems- ok w/ Creat=1.4;  CBC- ok w/ Hg=11.5;  TSH= 1.56...   ~  May 29, 2013:  52mo ROV & [facility] is c/o some sinusitis, left ear discomfort, low grade temp, and feeling rundown;  She is blowing out some yellow mucus, no blood, & has left sided sinus HA;  We discussed Rx w/ ZPak, Flonase, & Medrol Dosepak for relief, along w/ Mucinex, Saline, & fluids...  We reviewed the following medical problems during today's office visit >>     Asthma> on Advair100Bid & Proair prn; breathing is good w/o recent exac; also uses OTC Antihist & Flonase as needed for nasal symptoms...    HxPE/ DVT/ FactorV deficiency> on Coumadin via  coumadin clinic & doing satis by her report; she denies leg pain, swelling, etc...     HBP> on Cardizem240, Cozaar100, Lasix40-1/2, & K20; BP= 120/70 & occas higher at home; denies CP, palpit, ch in SOB or edema...    CHOL> on Simva10 + Feno160; FLP 3/15 showed TChol 153, TG 114, HDL 49, LDL 81    Hx severe GERD> hx largeHH & severe GERD, followed by [facility] & eval at [facility] as well- symptoms improved on Nexium40Bid    Divertics> on Citrucel daily; she had acute diverticulitis 2/13 treated by [facility] in [facility]; doing better since then & denies abd pain, n/v, c/d, blood seen...    Renal Insuffic> hx mild RI w/ BUN=35, Cr= 1.6    GU- Neurogenic bladder> followed by Urology; on Methenamine500Bid to prevent bladder infections & improved; she used to self cath but has been voiding satis recently...    DJD, hyperuricemia, Osteopenia> Hx Cspine dis w/ myelopathy, followed by [facility] & improved; on Allopurinol300 w/ Uric= 3-4 range on  this...    Anxiety> on Zoloft100-1/2 daily...    Anemia> Hg stable in the 11 to 12 range... We reviewed prob list, meds, xrays and labs> see below for updates >>   CXR 3/15 showed norm heart size, largeHH, scoliosis, clear lungs & NAD...  LABS 3/15:  FLP at goals on Simva10 & Feno160;  Chems- ok x mild RI w/ BUN=35, Cr=1.6;  CBC- ok x Hg=11.9;  TSH=1.39  ~  November 29, 2013:  68mo ROV & [facility] reports a good interval after being treated for AB 3/15; she has maintained on her ADVAIR100Bid & has AlbutHFA for prn use;  She denies much cough, sputum, dyspnea, etc;  [place] too [place] 7 [place], no facilities nearby etc;  Asked to consider silver sneakers vs home exercise program, etc;  As noted she has a large HH & symptoms cntrolled w/ Nexium40Bid, antireflux regimen, elev HOB, NPO after dinner, etc;  She continues to f/u w/ [facility] in [facility] on Coumadin w/ Hx DVT & PE- stable;  Hx HBP, Hyperlipidemia, renal insuffic & neurogenic bladder, other issues as noted> all stable on meds w/ labs per [facility]...   We reviewed prob list, meds, xrays and labs> see below for updates >>   ~  December 04, 2014:  49yr Nancy Hicks reports a good year from the pulmonary standpoint- stable on Advair100Bid & hasn't needed the Clark rescue inhaler in >75yr; she notes breathing is good, no cough/ sputum/ hemoptysis/ SOB or chest discomfort; unfortunately she has had a lot of orthopedic problems left knee & left hip especially, she been told she needs a TKR but Ortho reluctant to do it w/ her hx DVT/ PTE, we discussed getting a second opinion & she would like to see [facility]... She also had a knot develop on her left parotid glad=> eval & surg by [facility] 8/16 w/ atypical lymphoid infiltrate & they are awaiting special studies to r/o any cancer cells... We reviewed the following medical problems during today's office visit >> she is followed by [facility] for Primary  Care.    Left parotid nodule excised by [facility] 8/16 => final path pending...    Asthma> on Advair100Bid & Proair prn; breathing is good w/o recent exac; also uses OTC Antihist & Flonase as needed for nasal symptoms...    HxPE/ DVT/ FactorV deficiency> on Coumadin via [facility] coumadin clinic & doing satis by her report; she denies leg pain, swelling, etc...     HBP> on Cardizem240, Cozaar100, Lasix20, ?Hygroton12.5, & K20; BP= 114/64 & similar at home; denies CP, palpit, ch in SOB or edema; she will check w/ PCP re: diuretics.    CHOL> on Simva10 + Feno160; FLP 3/15 showed TChol 153, TG 114, HDL 49, LDL 81 (labs in [place] by [facility])    Hx severe GERD> hx largeHH & severe GERD, followed by [facility] & eval at [facility] as well- symptoms improved on Nexium40Qd & antireflux regimen.    Divertics> on Citrucel daily; she had acute diverticulitis 2/13 treated by [facility] in [facility]; doing better since then & denies abd pain, n/v, c/d, blood seen...    Renal Insuffic> hx mild RI w/ BUN=37, Cr= 1.6 (stable)    GU- Neurogenic bladder> followed by Urology; on Methenamine500Bid to prevent bladder infections & improved; she used to self cath but has been voiding satis recently...    DJD, hyperuricemia, Osteopenia> Hx Cspine dis w/ myelopathy, followed by [facility] & improved; on Allopurinol300 w/ Uric= 3-4 range on this; she has developed severe left knee DJD & si told she needs a TKR but the surg in [place] is reluctant due to her hx DVT, Factor V Leiden defic => we will refer to [facility] for a seconed opinion consult...    Anxiety> on Zoloft100-1/2 daily...    Anemia> Hg stable in the 11 to 12 range... IMP/PLAN>>  [facility]' breathing is stable & she will continue the Advair regularly & the Ventolin prn;  We will refer her to Ortho for second opinion regarding her severe left knee DJD.  ~  December 04, 2015:  Yearly ROV & pulmonary follow up visit>  Her PCP is Dr. Asencion Hicks, his notes are not avail to  review... Nancy [facility] reports that her breathing is good, denies prob w/ her asthma using Advair100- one inhalation daily & VentolinHFA rescue inhaler prn (hasn't needed);  She had left parotid nodule surg 10/2014 by [facility] & oncology eval by DrPenland (see below);  PET scan also showed some uptake in her colon and colonoscopy revealed several polyps- tubular adenomas on path...     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    Asthma> on Advair100- pt decr to once/d & Proair prn; breathing is good w/o recent exac; also uses OTC Antihist & Flonase as needed for nasal symptoms...    HxPE/ DVT/ FactorV deficiency> on Coumadin via [place] coumadin clinic & doing satis by her report; she denies leg pain, swelling, etc...     HBP> on Cardizem240, Cozaar100, Lasix20 & K20; DrFahan added HYGROTON25-1/2 tab daily; BP= 130/60 & similar at home; denies CP, palpit, ch in SOB or edema; she will check w/ PCP re: diuretics.    CHOL> on Simva10 + Feno160; last FLP in epic 3/15 showed TChol 153, TG 114, HDL 49, LDL 81 (f/u labs in [place] by [facility])    Hx  severe GERD> hx largeHH & severe GERD, followed by [facility] & eval at [facility] as well- symptoms improved on Nexium40Qd & antireflux regimen.    Divertics/ colon polyps> on Citrucel daily; she had acute diverticulitis 2/13 treated by [facility] in [place]; doing better since then & denies abd pain, n/v, c/d, blood seen; she had PET uptake in colon leading to colonoscopy & polypectomy at [facility]- notes reviewed in Care Everywhere & tubular adenomas removed...    Hx Renal Insuffic> hx mild RI w/ BUN=37, Cr= 1.6 => improved to 1.0 range in more recent follow up...    GU- Neurogenic bladder> followed by Urology; on Methenamine500Bid to prevent bladder infections & improved; she used to self cath but has been voiding satis recently...    DJD, hyperuricemia, Osteopenia> Hx Cspine dis w/ myelopathy, followed by [facility] & improved; on Allopurinol300 w/ Uric= 3-4 range on this; she has developed severe left knee DJD & si told she needs a TKR but the surg in [place] is reluctant due to her hx DVT, Factor V Leiden defic => we will refer to [facility] for a seconed opinion consult=> received Synvisc w/ min benefit, she needs TKR but too risky, ambulates w/ cane...    Anxiety> on Zoloft50 & doing quite well...    Anemia> Hg stable in the 11 to 12 range... EXAM shows Afeb, VSS, O2sat=98% on RA;  HEENT- neg, mallampati3;  Chest- clear w/o w/r/r;  Heart- RR w/o m/r/g;  Abd- soft nontender, neg;  Ext- neg w/o c/c/e;  Neuro- intact w/ focal abn... IMP/PLAN>>  Vandora' asthma is under excellent control- continue sme meds; she needs the 2017 Flu vaccine & says she will get this from drFagan in Oct;  Call for any problems...           Problem List:   she had PNEUMOVAX 2004, & gets yearly Flu shots each Fall... TDAP given 8/11.  ASTHMA (ICD-493.90) - on ADVAIR 100Bid & VentolinHFA Prn... prev required O2 and Medrol but not in some time now- doing well- stable without cough, sputum, SOB, wheezing, CP, etc... prev on allergy  vaccine & she stopped  it... she has noted incr dyspnea w/ lack of exercise & improved back on exerc program. ~  NOTE:  large HH seen on CXR, & takes Nexium Tid... ~  CXR 2/11 showed large HH, DJD spine, clear lungs, NAD.Marland Kitchen. ~  CXR 2/13 showed large HH, DJD/ scoliosis of spine, NAD.Marland Kitchen. ~  8/13:  She is too sedentary & notes some DOE w/ ADLs; rec to enter cardio-pulm rehab program... ~  2/14: she reports that [place] has really helped; now doing her own exercises at home... ~  8/14: on Advair100Bid & Proair prn; breathing is good w/o recent exac; also uses OTC Antihist & Flonase as needed for nasal symptoms. ~  9/15: on Advair100Bid & AlbutHFA rescue inhaler prn; she reports a good 44mo interval w/o recurrent exac etc...  PULMONARY EMBOLISM, HX OF (ICD-V12.51) & FACTOR V DEFICIENCY (ICD-286.3) - last hosp in 9/06 for R heart cath to r/o pulmHTN & normal pressures were found... DEEP VENOUS THROMBOPHLEBITIS, HX OF (ICD-V12.52) - as above, & she wears TED hose, no edema... ~  DVT in right leg and PTE on CTAngio in 4/05 and treated w/ Hep/ COUMADIN (protimes followed in [place] by DrFagan)...   HYPERTENSION (ICD-401.9) - on DILTIAZEM 240mg /d, LOSARTAN 100mg /d, LASIX 40mg - 1/2 tab daily, & K20/d... ~  2DEcho 6/06 showed normal LV wall thicknees & LVF, ? mild pulm HTN w/ RVsys est 30-40 ~  8/12:  BP= 134/76 & she denies CP, palpit, dizzy, SOB, edema... ~  2/13:  BP= 140/60 & she remains asymptomatic... ~  2/14: on Diltiazem240, Losartan50, Lasix20> BP= 130/70 today & she denies CP, palpit, ch in DOE, edema, etc ~  8/14: on Cardizem240, Cozaar100, Lasix40-1/2, & K20; BP= 142/84 & occas higher at home; may needs addition of BBlocker but holding off for now; denies CP, palpit, ch in SOB or edema. ~  9/15: on same meds w/ BP= 120/60 & she denies CP, palpit, SOB, dizzy, edema... ~  9/16: she lists the same meds + hygroton & she will check w/ her PCP- BP= 114/64 & she remains asymptomatic, no edema, renal  insuffic stable.  HYPERCHOLESTEROLEMIA (ICD-272.0) - on SIMVASTATIN 10mg /d now & FENOFIBRATE 160mg /d... ~  FLP 6/08 (on Lescol & Tricor) showed TChol 165, TG 94, HDL 49, LDL 97... rec- contin same. ~  [place] 7/09 showed TChol 152, TG 123, HDL 45, LDL 83... rec- change to Simva40 + Fenofib160 for $$. ~  FLP 7/10 showed TChol 160, TG 107, HDL 51, LDL 88... continue same. ~  [place] 8/11 (on Simva10+Feno160) showed TChol 154, TG 102, HDL 55, LDL 79... stable, same Rx. ~  [place] 2/12 (on Simva10+Feno160) showed TChol 152, TG 118, HDL 48, LDL 80 ~  FLP 2/13 (on Simva10+Feno160) showed TChol 172, TG 123, HDL 52, LDL 95 ~  FLP 2/14 on Simva10+Feno160 showed TChol 140, TG 125, HDL 44, LDL 71  ~  FLP 3/15 on simva20+Feno160 showed TChol 153, TG 114, HDL 49, LDL 81  GASTROESOPHAGEAL REFLUX DISEASE, SEVERE (ICD-530.81) - she has a large HH seen on routine CXRs; severe GERD followed by [facility]; on Terrell 40mg - recent decr to 1/d... c/o gas & encouraged to take GasX, Mylicon, BeanO. ~  last EGD 6/06 showed large HH & stricture dilated... ~  GI eval for incr dysphagia 1/12 w/ EGD by [facility] showing large prolapsing HH, candida esoph, erosions & polyps> treated w/ Diflucan, Nexium decr to once daily. ~  She reports stable on Nexium 40mg  /d... ~  2013: She's had  f/u w/ [facility]> chr GERD, intermit solid food dysphagia; he rec Ba Swallow, Manometry & 24H pH probe, and appt at [facility]... ~  7/13:  Ba Swallow showed web-like narrowing in cerv esoph, presbyesoph, tortuous mid-distal esoph, largeHH, spasm distally, 71mm Ba tablet temporarily lodged in mid-esoph... ~  2/14: she reports much improved on Nexium40Bid... ~  She continues to f/u w/ GI at [facility] now that [facility] has retired; stable on antireflux regimen and Nexium40/d...  DIVERTICULOSIS, COLON (ICD-562.10) - on LEVSIN 0.125mg  tid prn... ~  colonoscopy 10/00 showed divertics, no polyps. ~  [facility] f/u [facility]/09- note reviewed... on CITRUCEL daily, &  rec to take Phazyme for Gas. ~  [facility] 2/13 at [facility] by [facility] for Diverticulitis, treated w/ Cipro/ Flagyl & improved... ~  CT Abd&Pelvis 2/13 showed [place], s/p GB, kidney cysts, acute diverticulitis in prox sigmoid (Rx by [facility] in [place]) & no complicating features...  RENAL INSUFFICIENCY (ICD-588.9) ~  labs 6/08 showed BUN= 43, Creat= 1.7 ~  labs 7/09 showed BUN= 35, Creat= 1.7 ~  labs 7/10 showed BUN= 51, Creat= 1.8 ~  labs 9/10 showed BUN= 33, Creat= 1.4 ~  labs 2/11 showed BUN= 32, Creat= 2.1, K= 4.6 ~  labs 8/11 showed BUN= 34, Creat= 1.5, K= 4.7 ~  labs 2/12 showed BUN= 37, creat= 1.5, K= 4.9 ~  Labs 2/13 showed BUN= 38, Creat= 1.5, K= 4.3 ~  Labs 2/14 showed BUN= 38, Creat= 1.4, K= 4.4 ~  Labs 3/15 showed BUN= 35, Cr= 1.6, K= 4.5 ~  Labs 8/16 showed BUN= 37, Cr= 1.6, K= 4.4  NEUROGENIC BLADDER (ICD-596.54) - eval and Rx by DrKimbrough in the past... prev had to self-cath regularly, & now voids satis on her own... still on MANDELAMINE for UTI prophylaxis per Urology.  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 also takes ALLOPURINOL 300mg /d for hyperuricemia... ~  labs 7/10 showed Uric= 4.3.Marland Kitchen. rec> continue Allopurinol. ~  labs 8/11 showed Uric= 3.8  OSTEOPENIA (ICD-733.90) - BMD from GYN= DrNeal w/ osteopenia Rx'd w/ ca++, MVI, VitD... ~  7/10: pt reports that DrNeal did VitD level & started her on Vit D 2000 u daily... ~  labs 2/11 showed Vit D level = 54... ~  Labs 2/13 showed Vit D level = 48... Continue supplement.  ANXIETY (ICD-300.00) - on ZOLOFT 100mg - 1/2 tab Qhs...  ANEMIA (ICD-285.9) - hx of intol to all  P O forms of iron therapy... ~  labs 6/08 showed Hg= 11.1.Marland Kitchen. ~  labs 7/09 showed Hg= 10.3.Marland KitchenMarland Kitchen [facility] checked labs 10/09 w/ Fe= 77, Ferritin= 26, B12= 382. ~  labs 7/10 showed Hg= 11.0 ~  labs 2/11 showed Hg= 11.6 ~   labs 8/11 showed Hg= 11.9 ~  labs 2/12 showed Hg= 11.4 ~  Labs 2/13 showed Hg= 12.4, MCV= 96 ~  Labs 2/14 showed Hg= 11.5 ~  Labs 3/15 showed Hg= 11.9 ~  Labs 8/16 showed Hg= 11.7   Past Surgical History:  Procedure Laterality Date  . CHOLECYSTECTOMY    . ESOPHAGEAL MANOMETRY  10/26/2011   Procedure: ESOPHAGEAL MANOMETRY (EM);  Surgeon: Sable Feil, MD;  Location: [facility];  Service: Endoscopy;  Laterality: N/A;  . EYE SURGERY Bilateral    cataracts  . PAROTIDECTOMY Left 11/14/2014  . PAROTIDECTOMY Left 11/14/2014   Procedure: LEFT LATERAL PAROTIDECTOMY;  Surgeon: Leta Baptist, MD;  Location: [place];  Service:  ENT;  Laterality: Left;  . 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 TONSILLECTOMY      Outpatient Encounter Prescriptions as of 12/04/2015  Medication Sig Dispense Refill  . allopurinol (ZYLOPRIM) 300 MG tablet Take 300 mg by mouth daily.      . Biotin 5000 MCG CAPS Take 1 capsule by mouth daily.    . Calcium Citrate-Vitamin D (CITRACAL MAXIMUM) 315-250 MG-UNIT TABS Take 1 capsule by mouth daily.    . chlorthalidone (HYGROTON) 25 MG tablet Take 12.5 mg by mouth daily.    . Cholecalciferol (VITAMIN D3) 2000 UNITS TABS Take 1 tablet by mouth daily.      Marland Kitchen diltiazem (CARDIZEM CD) 240 MG 24 hr capsule take 1 capsule by mouth once daily 90 capsule 3  . esomeprazole (NEXIUM) 40 MG capsule Take 40 mg by mouth daily before breakfast.     . fenofibrate 160 MG tablet take 1 tablet once daily 90 tablet 3  . fluticasone (FLONASE) 50 MCG/ACT nasal spray instill 2 sprays into each nostril once daily 16 g 5  . Fluticasone-Salmeterol (ADVAIR DISKUS) 100-50 MCG/DOSE AEPB inhale 1 dose by mouth twice a day 60 each 11  . furosemide (LASIX) 20 MG tablet Take 1 tablet (20 mg total) by mouth daily. 90 tablet 1  . losartan (COZAAR) 100 MG tablet take 1 tablet by mouth once daily 90 tablet 1  . methenamine (HIPREX) 1 G tablet Take 500 mg by mouth 2 (two) times daily with a meal.     . methylcellulose  (CITRUCEL) oral powder Take 1 tsp by mouth daily     . Multiple Vitamin (MULITIVITAMIN WITH MINERALS) TABS Take 1 tablet by mouth daily.    Vladimir Faster Glycol-Propyl Glycol (SYSTANE ULTRA PF) 0.4-0.3 % SOLN Place 2 drops into both eyes 3 (three) times daily.     . potassium chloride SA (K-DUR,KLOR-CON) 20 MEQ tablet take 1 tablet by mouth once daily 90 tablet 0  . sertraline (ZOLOFT) 50 MG tablet Take 1 tablet (50 mg total) by mouth daily. 90 tablet 1  . simvastatin (ZOCOR) 10 MG tablet Take 10 mg by mouth at bedtime.      . VENTOLIN HFA 108 (90 BASE) MCG/ACT inhaler inhale 2 puffs by mouth every 6 hours AS NEEDED FOR WHEEZING 18 Inhaler 2  . warfarin (COUMADIN) 2.5 MG tablet Take 2.5 mg by mouth daily. Takes 2.5 mg everyday.    . [DISCONTINUED] Fluticasone-Salmeterol (ADVAIR DISKUS) 100-50 MCG/DOSE AEPB inhale 1 dose by mouth twice a day 60 each 6   No facility-administered encounter medications on file as of 12/04/2015.     Allergies  Allergen Reactions  . Metoclopramide Hcl Other (See Comments)    REACTION: causes uncontrolable leg movements  . Nitrofurantoin Nausea And Vomiting and Other (See Comments)    REACTION: dizziness    Current Medications, Allergies, Past Medical History, Past Surgical History, Family History, and Social History were reviewed in [employer] record.    Review of Systems    See HPI - all other systems neg except as noted...  The patient complains of dyspnea on exertion.  The patient denies anorexia, fever, weight loss, weight gain, vision loss, decreased hearing, hoarseness, chest pain, syncope, peripheral edema, prolonged cough, headaches, hemoptysis, abdominal pain, melena, hematochezia, severe indigestion/heartburn, hematuria, incontinence, muscle weakness, suspicious skin lesions, transient blindness, difficulty walking, depression, unusual weight change, abnormal bleeding, enlarged lymph nodes, and angioedema.     Objective:    Physical  Exam     WD, WN, 82 y/o WF in NAD GENERAL:  Alert & oriented; pleasant & cooperative... HEENT:  [place]/AT, EOM-wnl, PERRLA, EACs-clear, TMs-wnl, NOSE-clear, THROAT-clear & wnl. NECK:  Supple w/ decrROM; no JVD; normal carotid impulses w/o bruits; no thyromegaly or nodules palpated; no lymphadenopathy. CHEST:  Clear to P & A; without wheezes/ rales/ or rhonchi. HEART:  regular rhythm; without murmurs/ rubs/ or gallops. ABDOMEN:  soft & nontender; normal bowel sounds; no organomegaly or masses detected. EXT: without deformities, mild arthritic changes; no varicose veins/ venous insuffic/ or edema. NEURO:  CN's intact;  no focal neuro deficits... DERM:  No lesions noted; no rash etc...  RADIOLOGY DATA:  Reviewed in the EPIC EMR & discussed w/ the patient...  LABORATORY DATA:  Reviewed in the EPIC EMR & discussed w/ the patient...   Assessment & Plan:    ENT> left parotid nodule removed 8/16 by [facility]=> path= low grade LYMPHOMA; eval & f/u DrPenland at [facility] on observation...  ASTHMA>  Stable on Advair, Ventolin; she is improved s/p cardio-pulm REHAB at [facility]... Encouraged to  Maintain exercise program on her own... 12/04/15>  Jahnyla' asthma is under excellent control- continue sme meds; she needs the 2017 Flu vaccine & says she will get this from drFagan in Oct;  Call for any problems...   Hx DVT/ PTE/ Factor V defic>  Stable on Coumadin w/ protimes per DrFagan in [place]; continue same...  HBP>  Controlled on Cardizem, Losartan, Lasix; continue same...  CHOL>  Stable on Simva + Fenofib and diet;  Continue same...  LargeHH/ GERD>  On NexiumBid now & followed by [facility]; continue same meds & vigorous antireflux program; he is in the process of further eval & referral to [facility]...  Divertics>  She had bout of acute diverticulitis 2/13 treated at [facility]; symptoms resolved & now on maintenance program...  Renal Insuffic, Hx of Neurogenic Bladder>   Stable on current meds & voiding satis w/o need to self cath, she still takes Mandelamine for UTI prevention...  DJD, Hyperuricemia, DDD, Osteopenia>  She remains on Allopurinol, calcium, MVI, Vit D... We will set up appt w/ [facility] for 2nd opinion.  Anemia>  Hg remains stable at 11-12 range...  Anxiety/ Depression>  On ZOLOFT 100mg  taking 1/2 Qhs...   Patient's Medications  New Prescriptions   No medications on file  Previous Medications   ALLOPURINOL (ZYLOPRIM) 300 MG TABLET    Take 300 mg by mouth daily.     BIOTIN 5000 MCG CAPS    Take 1 capsule by mouth daily.   CALCIUM CITRATE-VITAMIN D (CITRACAL MAXIMUM) 315-250 MG-UNIT TABS    Take 1 capsule by mouth daily.   CHLORTHALIDONE (HYGROTON) 25 MG TABLET    Take 12.5 mg by mouth daily.   CHOLECALCIFEROL (VITAMIN D3) 2000 UNITS TABS    Take 1 tablet by mouth daily.     DILTIAZEM (CARDIZEM CD) 240 MG 24 HR CAPSULE    take 1 capsule by mouth once daily   ESOMEPRAZOLE (NEXIUM) 40 MG CAPSULE    Take 40 mg by mouth daily before breakfast.    FENOFIBRATE 160 MG TABLET    take 1 tablet once daily   FLUTICASONE (FLONASE) 50 MCG/ACT NASAL SPRAY    instill 2 sprays into each nostril once daily   FUROSEMIDE (LASIX) 20 MG TABLET    Take 1 tablet (20 mg total) by mouth daily.   LOSARTAN (COZAAR) 100 MG TABLET  take 1 tablet by mouth once daily   METHENAMINE (HIPREX) 1 G TABLET    Take 500 mg by mouth 2 (two) times daily with a meal.    METHYLCELLULOSE (CITRUCEL) ORAL POWDER    Take 1 tsp by mouth daily    MULTIPLE VITAMIN (MULITIVITAMIN WITH MINERALS) TABS    Take 1 tablet by mouth daily.   POLYETHYL GLYCOL-PROPYL GLYCOL (SYSTANE ULTRA PF) 0.4-0.3 % SOLN    Place 2 drops into both eyes 3 (three) times daily.    POTASSIUM CHLORIDE SA (K-DUR,KLOR-CON) 20 MEQ TABLET    take 1 tablet by mouth once daily   SERTRALINE (ZOLOFT) 50 MG TABLET    Take 1 tablet (50 mg total) by mouth daily.   SIMVASTATIN (ZOCOR) 10 MG TABLET    Take 10 mg by mouth at  bedtime.     VENTOLIN HFA 108 (90 BASE) MCG/ACT INHALER    inhale 2 puffs by mouth every 6 hours AS NEEDED FOR WHEEZING   WARFARIN (COUMADIN) 2.5 MG TABLET    Take 2.5 mg by mouth daily. Takes 2.5 mg everyday.  Modified Medications   Modified Medication Previous Medication   FLUTICASONE-SALMETEROL (ADVAIR DISKUS) 100-50 MCG/DOSE AEPB Fluticasone-Salmeterol (ADVAIR DISKUS) 100-50 MCG/DOSE AEPB      inhale 1 dose by mouth twice a day    inhale 1 dose by mouth twice a day  Discontinued Medications   No medications on file

## 2015-12-04 NOTE — Patient Instructions (Signed)
Today we updated your med list in our EPIC system...    Continue your current medications the same...  Stay as [facility] as poss [facility]  Call for any questions...  Let's plan a follow up visit in 78yr, sooner if needed for breathing problems.Marland KitchenMarland Kitchen

## 2015-12-10 ENCOUNTER — Ambulatory Visit (INDEPENDENT_AMBULATORY_CARE_PROVIDER_SITE_OTHER): Payer: Medicare Other | Admitting: Urology

## 2015-12-10 DIAGNOSIS — N3020 Other chronic cystitis without hematuria: Secondary | ICD-10-CM

## 2016-01-07 ENCOUNTER — Emergency Department (HOSPITAL_COMMUNITY): Payer: Medicare Other

## 2016-01-07 ENCOUNTER — Encounter (HOSPITAL_COMMUNITY): Payer: Self-pay | Admitting: Emergency Medicine

## 2016-01-07 ENCOUNTER — Observation Stay (HOSPITAL_COMMUNITY)
Admission: EM | Admit: 2016-01-07 | Discharge: 2016-01-09 | Disposition: A | Discharge status: Home / Self Care | Payer: Medicare Other | Attending: Internal Medicine | Admitting: Internal Medicine

## 2016-01-07 DIAGNOSIS — Z85828 Personal history of other malignant neoplasm of skin: Secondary | ICD-10-CM | POA: Diagnosis not present

## 2016-01-07 DIAGNOSIS — K625 Hemorrhage of anus and rectum: Secondary | ICD-10-CM | POA: Diagnosis not present

## 2016-01-07 DIAGNOSIS — Z86711 Personal history of pulmonary embolism: Secondary | ICD-10-CM | POA: Diagnosis not present

## 2016-01-07 DIAGNOSIS — J4489 Other specified chronic obstructive pulmonary disease: Secondary | ICD-10-CM | POA: Diagnosis present

## 2016-01-07 DIAGNOSIS — K5732 Diverticulitis of large intestine without perforation or abscess without bleeding: Secondary | ICD-10-CM | POA: Diagnosis present

## 2016-01-07 DIAGNOSIS — K5792 Diverticulitis of intestine, part unspecified, without perforation or abscess without bleeding: Secondary | ICD-10-CM | POA: Diagnosis not present

## 2016-01-07 DIAGNOSIS — Z825 Family history of asthma and other chronic lower respiratory diseases: Secondary | ICD-10-CM | POA: Insufficient documentation

## 2016-01-07 DIAGNOSIS — I739 Peripheral vascular disease, unspecified: Secondary | ICD-10-CM | POA: Insufficient documentation

## 2016-01-07 DIAGNOSIS — I129 Hypertensive chronic kidney disease with stage 1 through stage 4 chronic kidney disease, or unspecified chronic kidney disease: Secondary | ICD-10-CM | POA: Insufficient documentation

## 2016-01-07 DIAGNOSIS — R05 Cough: Secondary | ICD-10-CM | POA: Diagnosis not present

## 2016-01-07 DIAGNOSIS — E7800 Pure hypercholesterolemia, unspecified: Secondary | ICD-10-CM | POA: Diagnosis not present

## 2016-01-07 DIAGNOSIS — I7300 Raynaud's syndrome without gangrene: Secondary | ICD-10-CM | POA: Insufficient documentation

## 2016-01-07 DIAGNOSIS — N189 Chronic kidney disease, unspecified: Secondary | ICD-10-CM | POA: Diagnosis present

## 2016-01-07 DIAGNOSIS — D631 Anemia in chronic kidney disease: Secondary | ICD-10-CM | POA: Diagnosis not present

## 2016-01-07 DIAGNOSIS — K5733 Diverticulitis of large intestine without perforation or abscess with bleeding: Secondary | ICD-10-CM | POA: Diagnosis present

## 2016-01-07 DIAGNOSIS — Z7901 Long term (current) use of anticoagulants: Secondary | ICD-10-CM | POA: Diagnosis not present

## 2016-01-07 DIAGNOSIS — K219 Gastro-esophageal reflux disease without esophagitis: Secondary | ICD-10-CM | POA: Diagnosis not present

## 2016-01-07 DIAGNOSIS — I10 Essential (primary) hypertension: Secondary | ICD-10-CM | POA: Diagnosis present

## 2016-01-07 DIAGNOSIS — M25511 Pain in right shoulder: Secondary | ICD-10-CM

## 2016-01-07 DIAGNOSIS — K210 Gastro-esophageal reflux disease with esophagitis: Secondary | ICD-10-CM

## 2016-01-07 DIAGNOSIS — D682 Hereditary deficiency of other clotting factors: Secondary | ICD-10-CM | POA: Diagnosis not present

## 2016-01-07 DIAGNOSIS — K589 Irritable bowel syndrome without diarrhea: Secondary | ICD-10-CM | POA: Insufficient documentation

## 2016-01-07 DIAGNOSIS — Z888 Allergy status to other drugs, medicaments and biological substances status: Secondary | ICD-10-CM | POA: Insufficient documentation

## 2016-01-07 DIAGNOSIS — Z9842 Cataract extraction status, left eye: Secondary | ICD-10-CM | POA: Insufficient documentation

## 2016-01-07 DIAGNOSIS — J449 Chronic obstructive pulmonary disease, unspecified: Secondary | ICD-10-CM | POA: Diagnosis not present

## 2016-01-07 DIAGNOSIS — K449 Diaphragmatic hernia without obstruction or gangrene: Secondary | ICD-10-CM | POA: Diagnosis not present

## 2016-01-07 DIAGNOSIS — R918 Other nonspecific abnormal finding of lung field: Secondary | ICD-10-CM | POA: Insufficient documentation

## 2016-01-07 DIAGNOSIS — M1990 Unspecified osteoarthritis, unspecified site: Secondary | ICD-10-CM | POA: Insufficient documentation

## 2016-01-07 DIAGNOSIS — F419 Anxiety disorder, unspecified: Secondary | ICD-10-CM | POA: Insufficient documentation

## 2016-01-07 DIAGNOSIS — E1122 Type 2 diabetes mellitus with diabetic chronic kidney disease: Secondary | ICD-10-CM | POA: Diagnosis not present

## 2016-01-07 DIAGNOSIS — Z801 Family history of malignant neoplasm of trachea, bronchus and lung: Secondary | ICD-10-CM | POA: Insufficient documentation

## 2016-01-07 DIAGNOSIS — N182 Chronic kidney disease, stage 2 (mild): Secondary | ICD-10-CM | POA: Diagnosis not present

## 2016-01-07 DIAGNOSIS — Z833 Family history of diabetes mellitus: Secondary | ICD-10-CM | POA: Insufficient documentation

## 2016-01-07 DIAGNOSIS — R1310 Dysphagia, unspecified: Secondary | ICD-10-CM | POA: Insufficient documentation

## 2016-01-07 DIAGNOSIS — Z9841 Cataract extraction status, right eye: Secondary | ICD-10-CM | POA: Insufficient documentation

## 2016-01-07 DIAGNOSIS — Z86718 Personal history of other venous thrombosis and embolism: Secondary | ICD-10-CM

## 2016-01-07 DIAGNOSIS — N319 Neuromuscular dysfunction of bladder, unspecified: Secondary | ICD-10-CM | POA: Diagnosis not present

## 2016-01-07 DIAGNOSIS — M791 Myalgia: Secondary | ICD-10-CM | POA: Diagnosis not present

## 2016-01-07 DIAGNOSIS — Z8249 Family history of ischemic heart disease and other diseases of the circulatory system: Secondary | ICD-10-CM | POA: Insufficient documentation

## 2016-01-07 DIAGNOSIS — E892 Postprocedural hypoparathyroidism: Secondary | ICD-10-CM | POA: Insufficient documentation

## 2016-01-07 DIAGNOSIS — Z9049 Acquired absence of other specified parts of digestive tract: Secondary | ICD-10-CM | POA: Insufficient documentation

## 2016-01-07 DIAGNOSIS — R079 Chest pain, unspecified: Secondary | ICD-10-CM | POA: Diagnosis not present

## 2016-01-07 HISTORY — DX: Non-Hodgkin lymphoma, unspecified, extranodal and solid organ sites: C85.99

## 2016-01-07 LAB — CBC
HEMATOCRIT: 36.6 % (ref 36.0–46.0)
HEMOGLOBIN: 12 g/dL (ref 12.0–15.0)
MCH: 31 pg (ref 26.0–34.0)
MCHC: 32.8 g/dL (ref 30.0–36.0)
MCV: 94.6 fL (ref 78.0–100.0)
Platelets: 202 10*3/uL (ref 150–400)
RBC: 3.87 MIL/uL (ref 3.87–5.11)
RDW: 14.9 % (ref 11.5–15.5)
WBC: 8.9 10*3/uL (ref 4.0–10.5)

## 2016-01-07 LAB — COMPREHENSIVE METABOLIC PANEL
ALBUMIN: 3.8 g/dL (ref 3.5–5.0)
ALK PHOS: 93 U/L (ref 38–126)
ALT: 14 U/L (ref 14–54)
ANION GAP: 7 (ref 5–15)
AST: 24 U/L (ref 15–41)
BILIRUBIN TOTAL: 0.8 mg/dL (ref 0.3–1.2)
BUN: 40 mg/dL — AB (ref 6–20)
CO2: 24 mmol/L (ref 22–32)
Calcium: 9.3 mg/dL (ref 8.9–10.3)
Chloride: 106 mmol/L (ref 101–111)
Creatinine, Ser: 1.16 mg/dL — ABNORMAL HIGH (ref 0.44–1.00)
GFR calc Af Amer: 48 mL/min — ABNORMAL LOW (ref >60)
GFR calc non Af Amer: 41 mL/min — ABNORMAL LOW (ref >60)
GLUCOSE: 105 mg/dL — AB (ref 65–99)
POTASSIUM: 4.2 mmol/L (ref 3.5–5.1)
SODIUM: 137 mmol/L (ref 135–145)
TOTAL PROTEIN: 7 g/dL (ref 6.5–8.1)

## 2016-01-07 LAB — HEMOGLOBIN: Hemoglobin: 11.6 g/dL — ABNORMAL LOW (ref 12.0–15.0)

## 2016-01-07 LAB — TYPE AND SCREEN
ABO/RH(D): O POS
Antibody Screen: NEGATIVE

## 2016-01-07 LAB — URINALYSIS, ROUTINE W REFLEX MICROSCOPIC
Bilirubin Urine: NEGATIVE
GLUCOSE, UA: NEGATIVE mg/dL
Hgb urine dipstick: NEGATIVE
KETONES UR: NEGATIVE mg/dL
Leukocytes, UA: NEGATIVE
NITRITE: NEGATIVE
PROTEIN: NEGATIVE mg/dL
Specific Gravity, Urine: 1.01 (ref 1.005–1.030)
pH: 6 (ref 5.0–8.0)

## 2016-01-07 LAB — PROTIME-INR
INR: 2.36
Prothrombin Time: 26.2 seconds — ABNORMAL HIGH (ref 11.4–15.2)

## 2016-01-07 LAB — LIPASE, BLOOD: Lipase: 33 U/L (ref 11–51)

## 2016-01-07 LAB — HEMATOCRIT: HCT: 35.1 % — ABNORMAL LOW (ref 36.0–46.0)

## 2016-01-07 MED ORDER — SODIUM CHLORIDE 0.9 % IV SOLN
INTRAVENOUS | Status: DC
  Administered 2016-01-07: 22:00:00 via INTRAVENOUS

## 2016-01-07 MED ORDER — FAMOTIDINE IN NACL 20-0.9 MG/50ML-% IV SOLN
20.0000 mg | Freq: Two times a day (BID) | INTRAVENOUS | Status: DC
  Administered 2016-01-07: 20 mg via INTRAVENOUS
  Filled 2016-01-07: qty 50

## 2016-01-07 MED ORDER — ACETAMINOPHEN 650 MG RE SUPP: 650.0000 mg | Freq: Four times a day (QID) | RECTAL | Status: DC | PRN

## 2016-01-07 MED ORDER — MOMETASONE FURO-FORMOTEROL FUM 100-5 MCG/ACT IN AERO
2.0000 | INHALATION_SPRAY | Freq: Two times a day (BID) | RESPIRATORY_TRACT | Status: DC
  Administered 2016-01-08: 2 via RESPIRATORY_TRACT
  Filled 2016-01-07: qty 8.8

## 2016-01-07 MED ORDER — ACETAMINOPHEN 325 MG PO TABS: 650.0000 mg | ORAL_TABLET | Freq: Four times a day (QID) | ORAL | Status: DC | PRN

## 2016-01-07 MED ORDER — ADULT MULTIVITAMIN W/MINERALS CH
1.0000 | ORAL_TABLET | Freq: Every day | ORAL | Status: DC
  Administered 2016-01-08 – 2016-01-09 (×2): 1 via ORAL
  Filled 2016-01-07 (×2): qty 1

## 2016-01-07 MED ORDER — SERTRALINE HCL 50 MG PO TABS
50.0000 mg | ORAL_TABLET | Freq: Every day | ORAL | Status: DC
  Administered 2016-01-08 – 2016-01-09 (×2): 50 mg via ORAL
  Filled 2016-01-07 (×2): qty 1

## 2016-01-07 MED ORDER — ONDANSETRON HCL 4 MG/2ML IJ SOLN
4.0000 mg | Freq: Four times a day (QID) | INTRAMUSCULAR | Status: DC | PRN
  Filled 2016-01-07: qty 2

## 2016-01-07 MED ORDER — METRONIDAZOLE IN NACL 5-0.79 MG/ML-% IV SOLN
500.0000 mg | Freq: Once | INTRAVENOUS | Status: AC
  Administered 2016-01-07: 500 mg via INTRAVENOUS
  Filled 2016-01-07: qty 100

## 2016-01-07 MED ORDER — ALBUTEROL SULFATE (2.5 MG/3ML) 0.083% IN NEBU: 3.0000 mL | INHALATION_SOLUTION | RESPIRATORY_TRACT | Status: DC | PRN

## 2016-01-07 MED ORDER — FLUTICASONE PROPIONATE 50 MCG/ACT NA SUSP
1.0000 | Freq: Every day | NASAL | Status: DC
  Administered 2016-01-08 – 2016-01-09 (×2): 1 via NASAL
  Filled 2016-01-07 (×2): qty 16

## 2016-01-07 MED ORDER — SODIUM CHLORIDE 0.9 % IV SOLN
Freq: Once | INTRAVENOUS | Status: AC
  Administered 2016-01-07: 14:00:00 via INTRAVENOUS

## 2016-01-07 MED ORDER — ALLOPURINOL 300 MG PO TABS
300.0000 mg | ORAL_TABLET | Freq: Every day | ORAL | Status: DC
  Administered 2016-01-08 – 2016-01-09 (×2): 300 mg via ORAL
  Filled 2016-01-07 (×2): qty 1

## 2016-01-07 MED ORDER — SIMVASTATIN 10 MG PO TABS
10.0000 mg | ORAL_TABLET | Freq: Every day | ORAL | Status: DC
  Administered 2016-01-07 – 2016-01-08 (×2): 10 mg via ORAL
  Filled 2016-01-07 (×2): qty 1

## 2016-01-07 MED ORDER — HYDROCODONE-ACETAMINOPHEN 5-325 MG PO TABS
1.0000 | ORAL_TABLET | ORAL | Status: DC | PRN
  Administered 2016-01-07 – 2016-01-08 (×2): 1 via ORAL
  Administered 2016-01-08: 2 via ORAL
  Filled 2016-01-07: qty 1
  Filled 2016-01-07: qty 2
  Filled 2016-01-07: qty 1

## 2016-01-07 MED ORDER — ONDANSETRON HCL 4 MG PO TABS: 4.0000 mg | ORAL_TABLET | Freq: Four times a day (QID) | ORAL | Status: DC | PRN

## 2016-01-07 MED ORDER — CALCIUM CARBONATE-VITAMIN D 500-200 MG-UNIT PO TABS
1.0000 | ORAL_TABLET | Freq: Every day | ORAL | Status: DC
  Administered 2016-01-08 – 2016-01-09 (×2): 1 via ORAL
  Filled 2016-01-07 (×2): qty 1

## 2016-01-07 MED ORDER — METRONIDAZOLE IN NACL 5-0.79 MG/ML-% IV SOLN
500.0000 mg | Freq: Three times a day (TID) | INTRAVENOUS | Status: DC
  Administered 2016-01-08 – 2016-01-09 (×3): 500 mg via INTRAVENOUS
  Filled 2016-01-07 (×3): qty 100

## 2016-01-07 MED ORDER — POLYVINYL ALCOHOL 1.4 % OP SOLN
2.0000 [drp] | Freq: Three times a day (TID) | OPHTHALMIC | Status: DC
  Administered 2016-01-08 – 2016-01-09 (×3): 2 [drp] via OPHTHALMIC
  Filled 2016-01-07 (×2): qty 15

## 2016-01-07 MED ORDER — PANTOPRAZOLE SODIUM 40 MG PO TBEC
40.0000 mg | DELAYED_RELEASE_TABLET | Freq: Every day | ORAL | Status: DC
Start: 2016-01-08 — End: 2016-01-09
  Administered 2016-01-08 – 2016-01-09 (×2): 40 mg via ORAL
  Filled 2016-01-07 (×2): qty 1

## 2016-01-07 MED ORDER — CIPROFLOXACIN IN D5W 400 MG/200ML IV SOLN: 400.0000 mg | Freq: Two times a day (BID) | INTRAVENOUS | Status: DC

## 2016-01-07 MED ORDER — DILTIAZEM HCL ER COATED BEADS 240 MG PO CP24
240.0000 mg | ORAL_CAPSULE | Freq: Every day | ORAL | Status: DC
  Administered 2016-01-08 – 2016-01-09 (×2): 240 mg via ORAL
  Filled 2016-01-07 (×2): qty 1

## 2016-01-07 MED ORDER — CIPROFLOXACIN IN D5W 400 MG/200ML IV SOLN
400.0000 mg | Freq: Once | INTRAVENOUS | Status: AC
  Administered 2016-01-07: 400 mg via INTRAVENOUS
  Filled 2016-01-07: qty 200

## 2016-01-07 NOTE — ED Notes (Signed)
Pt drinking contrast for CT. 

## 2016-01-07 NOTE — Progress Notes (Addendum)
I PERSONALLY REVIEWED THE CT WITH DR. POFF. PT DOES NOT HAVE A HEMATOMA. THE ABNORMALITY ADJACENT TO THE DIVERTICULITIS IS A POSTMENOPAUSAL OVARY.  LAST TCS 2000 IN EMR-Amand[facility] DIVERTICULOSIS, NO POLYPS. MULTIPLE EGD FOR DYSPHAGIA.

## 2016-01-07 NOTE — ED Notes (Signed)
EDP in to re eval and discuss plan of care with pt and family

## 2016-01-07 NOTE — H&P (Signed)
History and Physical    Nancy Hicks [id number] DOB: [birth year] DOA: 01/07/2016  PCP: Asencion Noble, MD   Patient coming from: Home  Chief Complaint: Lower abdominal pain, chills, bright red blood per rectum  HPI: Nancy Hicks is a 82 y.o. female with medical history significant for GERD, hypertension, factor V deficiency, history of PE on warfarin, and diverticulosis who presents to the emergency department with bright red rectal bleeding. Patient reports that she had been in her usual state of fairly good health until the insidious development of pain in the lower abdomen yesterday. She described the pain as moderate in intensity, cramping in character, localized to the lower abdomen, both quadrants, and associated with chills, but no nausea, vomiting, or diarrhea. Pain was reminiscent of prior experiences with diverticulitis, though she notes the pain is in both lower quadrants now, where it had been localized to the left lower quadrant previously. Pain persisted today, patient had a small bowel movement which was followed by passage of "large amount" of bright red blood. Patient denies chest pain, palpitations, or dyspnea. There has been no lightheadedness, change in vision or hearing, or focal numbness or weakness. There has been no recent trauma or change in her medications. She denies any significant use of NSAIDs or alcohol. She continues to take her warfarin as prescribed for history of VTE.   ED Course: Upon arrival to the ED, patient is found to be afebrile, saturating well on room air, and with vital signs stable. Chemistry panel features a BUN of 40 and creatinine of 1.16, up from an apparent baseline of 1.0. CBC is entirely within the normal limits, urinalysis is unremarkable, and INR is within the therapeutic range at 2.36. CT of the abdomen and pelvis was obtained and findings are consistent with acute sigmoid diverticulitis with a small adjacent fluid collection suspicious  for a small focal hemorrhage or hematoma. Patient was treated empirically with ciprofloxacin and Flagyl and provided IV fluid hydration with normal saline. Gastroenterology was consulted by the ED physician and advised a medical admission with IV antibiotics. Patient had a loose stool while in the emergency department, but there was no apparent blood. She remained hemodynamically stable in the ED and in no apparent respiratory distress. She'll be admitted to the medical-surgical unit for ongoing evaluation and management of acute sigmoid diverticulitis complicated by bright red rectal bleeding.  Reviewof Systems:  All other systems reviewed and apart from HPI, are negative.  Past Medical History:  Diagnosis Date  . Anemia, unspecified   . Anxiety state, unspecified   . Blood dyscrasia    RV factor def  . Cancer ([place])    skin  . Degeneration of cervical intervertebral disc   . Disorder of bone and cartilage, unspecified   . Diverticulosis of colon (without mention of hemorrhage) 01/13/1999  . Esophageal reflux   . Esophageal stricture   . Factor V deficiency ([place])   . Factor V deficiency ([place])   . Hiatal hernia 04/09/2010  . HTN (hypertension)   . Irritable bowel syndrome   . Neurogenic bladder   . Osteoarthrosis, unspecified whether generalized or localized, unspecified site   . Peripheral vascular disease ([facility])    pe, dvt hx  . Personal history of thrombophlebitis   . Personal history of venous thrombosis and embolism   . Pure hypercholesterolemia   . Raynaud phenomenon   . Shortness of breath dyspnea   . Unspecified asthma(493.90)   . Unspecified disorder resulting from impaired renal function  cysts    Past Surgical History:  Procedure Laterality Date  . CHOLECYSTECTOMY    . ESOPHAGEAL MANOMETRY  10/26/2011   Procedure: ESOPHAGEAL MANOMETRY (EM);  Surgeon: Sable Feil, MD;  Location: [facility];  Service: Endoscopy;  Laterality: N/A;  . EYE SURGERY Bilateral     cataracts  . PAROTIDECTOMY Left 11/14/2014  . PAROTIDECTOMY Left 11/14/2014   Procedure: LEFT LATERAL PAROTIDECTOMY;  Surgeon: Leta Baptist, MD;  Location: [facility];  Service: ENT;  Laterality: Left;  . 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 TONSILLECTOMY       reports that she has never smoked. She has never used smokeless tobacco. She reports that she does not drink alcohol or use drugs.  Allergies  Allergen Reactions  . Metoclopramide Hcl Other (See Comments)    REACTION: causes uncontrolable leg movements  . Nitrofurantoin Nausea And Vomiting and Other (See Comments)    REACTION: dizziness    Family History  Problem Relation Age of Onset  . Parkinsonism Mother   . COPD Brother   . Asthma Brother   . Lung cancer Brother   . Heart attack Brother   . Diabetes Paternal Aunt      Prior to Admission medications   Medication Sig Start Date End Date Taking? Authorizing Provider  allopurinol (ZYLOPRIM) 300 MG tablet Take 300 mg by mouth daily.     Yes Historical Provider, MD  Biotin 5000 MCG CAPS Take 1 capsule by mouth daily.   Yes Historical Provider, MD  Calcium Citrate-Vitamin D (CITRACAL MAXIMUM) 315-250 MG-UNIT TABS Take 1 capsule by mouth daily.   Yes Historical Provider, MD  chlorthalidone (HYGROTON) 25 MG tablet Take 12.5 mg by mouth daily.   Yes Historical Provider, MD  Cholecalciferol (VITAMIN D3) 2000 UNITS TABS Take 1 tablet by mouth daily.     Yes Historical Provider, MD  diltiazem (CARDIZEM CD) 240 MG 24 hr capsule take 1 capsule by mouth once daily 06/20/14  Yes Noralee Space, MD  esomeprazole (NEXIUM) 40 MG capsule Take 40 mg by mouth daily before breakfast.  06/09/11  Yes Sable Feil, MD  fluticasone [facility]) 50 MCG/ACT nasal spray instill 2 sprays into each nostril once daily 02/15/15  Yes Noralee Space, MD  Fluticasone-Salmeterol (ADVAIR DISKUS) 100-50 MCG/DOSE AEPB inhale 1 dose by mouth twice a day 12/04/15  Yes Noralee Space, MD  furosemide (LASIX) 20 MG tablet Take  1 tablet (20 mg total) by mouth daily. 07/19/13  Yes Noralee Space, MD  losartan (COZAAR) 100 MG tablet take 1 tablet by mouth once daily   Yes Noralee Space, MD  methenamine (HIPREX) 1 G tablet Take 500 mg by mouth 2 (two) times daily with a meal.  04/11/11  Yes Historical Provider, MD  methylcellulose (CITRUCEL) oral powder Take 1 tsp by mouth daily    Yes Historical Provider, MD  Multiple Vitamin (MULITIVITAMIN WITH MINERALS) TABS Take 1 tablet by mouth daily.   Yes Historical Provider, MD  Polyethyl Glycol-Propyl Glycol (SYSTANE ULTRA PF) 0.4-0.3 % SOLN Place 2 drops into both eyes 3 (three) times daily.    Yes Historical Provider, MD  potassium chloride SA (K-DUR,KLOR-CON) 20 MEQ tablet take 1 tablet by mouth once daily   Yes Noralee Space, MD  sertraline (ZOLOFT) 50 MG tablet Take 1 tablet (50 mg total) by mouth daily. 05/29/13  Yes Noralee Space, MD  simvastatin (ZOCOR) 10 MG tablet Take 10 mg by mouth at bedtime.  Yes Historical Provider, MD  VENTOLIN HFA 108 (90 BASE) MCG/ACT inhaler inhale 2 puffs by mouth every 6 hours AS NEEDED FOR WHEEZING 11/09/14  Yes Noralee Space, MD  warfarin (COUMADIN) 2.5 MG tablet Take 2.5 mg by mouth daily. Takes 2.5 mg everyday.   Yes Historical Provider, MD  fenofibrate 160 MG tablet take 1 tablet once daily 04/16/13   Noralee Space, MD    Physical Exam: Vitals:   01/07/16 1400 01/07/16 1600 01/07/16 1700 01/07/16 1816  BP: 146/77 159/72 161/71 160/75  Pulse: 82 80 81 77  Resp:    20  Temp:    98.3 F (36.8 C)  TempSrc:    Oral  SpO2: 95% 96% 97% 98%  Weight:      Height:          Constitutional: NAD, calm, comfortable Eyes: PERTLA, lids and conjunctivae normal ENMT: Mucous membranes are moist. Posterior pharynx clear of any exudate or lesions.   Neck: normal, supple, no masses, no thyromegaly Respiratory: clear to auscultation bilaterally, no wheezing, no crackles. Normal respiratory effort.   Cardiovascular: S1 & S2 heard, regular rate and  rhythm. No carotid bruits. No significant JVD. Abdomen: Mild distension. Tender throughout, mainly in lower quadrants with rebound pain, but no guarding. No masses palpated. Bowel sounds normal.  Musculoskeletal: no clubbing / cyanosis. No joint deformity upper and lower extremities. Normal muscle tone.  Skin: no significant rashes, lesions, ulcers. Warm, dry, well-perfused. No pallor.  Neurologic: CN 2-12 grossly intact. Sensation intact, DTR normal. Strength 5/5 in all 4 limbs.  Psychiatric: Normal judgment and insight. Alert and oriented x 3. Normal mood and affect.     Labs on Admission: I have personally reviewed following labs and imaging studies  CBC:  Recent Labs Lab 01/07/16 1247  WBC 8.9  HGB 12.0  HCT 36.6  MCV 94.6  PLT 123XX123   Basic Metabolic Panel:  Recent Labs Lab 01/07/16 1247  NA 137  K 4.2  CL 106  CO2 24  GLUCOSE 105*  BUN 40*  CREATININE 1.16*  CALCIUM 9.3   GFR: Estimated Creatinine Clearance: 27.2 mL/min (by C-G formula based on SCr of 1.16 mg/dL (H)). Liver Function Tests:  Recent Labs Lab 01/07/16 1247  AST 24  ALT 14  ALKPHOS 93  BILITOT 0.8  PROT 7.0  ALBUMIN 3.8    Recent Labs Lab 01/07/16 1247  LIPASE 33   No results for input(s): AMMONIA in the last 168 hours. Coagulation Profile:  Recent Labs Lab 01/07/16 1306  INR 2.36   Cardiac Enzymes: No results for input(s): CKTOTAL, CKMB, CKMBINDEX, TROPONINI in the last 168 hours. BNP (last 3 results) No results for input(s): PROBNP in the last 8760 hours. HbA1C: No results for input(s): HGBA1C in the last 72 hours. CBG: No results for input(s): GLUCAP in the last 168 hours. Lipid Profile: No results for input(s): CHOL, HDL, LDLCALC, TRIG, CHOLHDL, LDLDIRECT in the last 72 hours. Thyroid Function Tests: No results for input(s): TSH, T4TOTAL, FREET4, T3FREE, THYROIDAB in the last 72 hours. Anemia Panel: No results for input(s): VITAMINB12, FOLATE, FERRITIN, TIBC, IRON,  RETICCTPCT in the last 72 hours. Urine analysis:    Component Value Date/Time   COLORURINE YELLOW 01/07/2016 [place] 01/07/2016 1715   LABSPEC 1.010 01/07/2016 1715   PHURINE 6.0 01/07/2016 1715   GLUCOSEU NEGATIVE 01/07/2016 1715   HGBUR NEGATIVE 01/07/2016 [place] 01/07/2016 [place] 01/07/2016 1715  PROTEINUR NEGATIVE 01/07/2016 1715   NITRITE NEGATIVE 01/07/2016 1715   LEUKOCYTESUR NEGATIVE 01/07/2016 1715   Sepsis Labs: @LABRCNTIP (procalcitonin:4,lacticidven:4) )No results found for this or any previous visit (from the past 240 hour(s)).   Radiological Exams on Admission: Ct Abdomen Pelvis Wo Contrast  Result Date: 01/07/2016 CLINICAL DATA:  Abdominal pain, rectal bleeding, possible diverticulitis EXAM: CT ABDOMEN AND PELVIS WITHOUT CONTRAST TECHNIQUE: Multidetector CT imaging of the abdomen and pelvis was performed following the standard protocol without IV contrast. COMPARISON:  12/21/2014 FINDINGS: Lower chest: Large hiatal hernia is noted. There is atelectasis left lower lobe posterior medially. Hepatobiliary: The study is limited without IV contrast. The patient is status postcholecystectomy. No intrahepatic biliary ductal dilatation. Pancreas: Unenhanced pancreas is unremarkable. Spleen: Unenhanced spleen is unremarkable. Adrenals/Urinary Tract: No adrenal gland mass. Exophytic cystic lesion upper pole of the left kidney is stable. Stable high-density lesion midpole of the left kidney. Stable exophytic lesion in upper pole of the right kidney. Tiny cyst in lower pole of the right kidney stable. No nephrolithiasis. No hydronephrosis or hydroureter. Stomach/Bowel: Oral contrast material was given to the patient. No small bowel obstruction. There is no pericecal inflammation. A low lying cecum is noted with tip in right anterior pelvis. Normal appendix is partially visualized. No pericecal inflammation. There is moderate contrast  material and some stool within transverse colon. Moderate stool noted within descending colon. Abundant stool noted in proximal and mid sigmoid colon. Multiple sigmoid colon diverticula. Mild redundant distal sigmoid colon. In axial image 54 there is thickening of diverticular wall and significant stranding of pericolonic fat in sigmoid colon right pelvis. This is confirmed on coronal image 48. Findings are consistent with acute diverticulitis. There is small adjacent mesocolonic fluid collection measures about 1.7 cm. There is about 4 3 Hounsfield units in attenuation suspicious for small focal hemorrhage/ hematoma. Moderate gas noted within rectum. Vascular/Lymphatic: Atherosclerotic calcifications of abdominal aorta bilateral renal artery origin and bilateral iliac arteries are noted. No retroperitoneal or mesenteric adenopathy. Reproductive: The uterus is atrophic. No adnexal mass. No inguinal adenopathy. Other: No free abdominal air. There is significant distended urinary bladder. Musculoskeletal: No destructive bony lesions are noted. There is levoscoliosis of the lumbar spine. Extensive degenerative changes are noted thoracolumbar spine. IMPRESSION: 1. In axial image 54 there is thickening of diverticular wall and significant stranding of pericolonic fat in sigmoid colon right pelvis. This is confirmed on coronal image 48. Findings are consistent with acute diverticulitis. There is small adjacent mesocolonic fluid collection measures about 1.7 cm. There is about 4 3 Hounsfield units in attenuation suspicious for small focal hemorrhage/ hematoma. Moderate gas noted within rectum. 2. Low lying cecum.  No pericecal inflammation. 3. Abundant stool noted in proximal sigmoid colon. 4. Stable cystic lesions bilateral kidney. No nephrolithiasis. No hydronephrosis or hydroureter. There is a distended urinary bladder. No bladder filling defects are noted. 5. No small bowel obstruction. 6. Large hiatal hernia is noted.  Atelectasis noted left lower lobe posterior medially. 7. Extensive degenerative changes thoracolumbar spine. Levoscoliosis thoracolumbar spine. These results were called by telephone at the time of interpretation on 01/07/2016 at 5:07 pm to Dr. Carmin Muskrat , who verbally acknowledged these results. Electronically Signed   By: Lahoma Crocker M.D.   On: 01/07/2016 17:08    EKG: Not performed, will obtain as appropriate.   Assessment/Plan  1. Acute sigmoid diverticulitis  - Pt presents with subjective fevers, lower abd pain, and CT findings are c/w acute sigmoid diverticulitis  - This is  complicated by rectal bleeding, addressed below, but no apparent abscess or perforation  - Empiric ciprofloxacin and Flagyl started in ED, will continue  - Supportive care with IVF, pain-control    2. Rectal bleeding - Pt reports sm BM just PTA with "large amt" of bright red blood  - Initial Hgb 12.0; she is anticoagulated with warfarin and INR is 2.36 on admission  - CT abdomen suggests bleeding is from site of diverticulitis  - GI is consulting and much appreciated - Pt had a loose stool in ED with no apparent blood, hopefully indicating that bleeding has stopped  - Plan to type & screen, continue a gentle IVF hydration, follow serial H&H overnight, hold warfarin on admission  - As Hgb wnl, vitals are stable, and bleeding appears to have stopped, benefit of reversing INR likely outweighs risk  3. Hx of PE  - Hx of factor V deficiency and PE - Managed with warfarin, INR is 2.36 on admission  - No evidence for acute VTE  - Warfarin held at time of admission given the GIB, but as pt is stable, bleeding appears to have stopped, and Hgb normal, will not reverse at this time   4. GERD - Stable, managed with Nexium at home - Continue daily PPI    5. COPD - Stable, no cough or wheeze - Continue scheduled ICS/LABA, prn albuterol   6. Hypertension  - At goal currently  - Hold Lasix and chlorthalidone while  providing a gentle IVF hydration  - Hold losartan on admission given acute infection and GIB, resume as appropriate   7. Chronic diastolic CHF - Appears dehydrated on admission  - TTE (12/01/04) with EF 99991111, grade 1 diastolic dysfunction, and mild TR  - Managed with Lasix at home, currently held while providing IVF hydration  - Follow daily wts and I/O's    8. CKD stage II  - SCr 1.16 on admission, close to her apparent baseline  - Appears dehydrated on admission, is receiving IVF hydration, and Lasix, chlorthalidone, and losartan held at time of admission  - Repeat chem panel in am   DVT prophylaxis: warfarin, held at time of admission d/t GIB  Code Status: Full  Family Communication: Son-in-law updated at bedside at pt request Disposition Plan: Admit to med-surg Consults called: Gastroenterology Admission status: Inpatient    Vianne Bulls, MD [facility] Pager [phone]  If 7PM-7AM, please contact night-coverage [email or web address] Password [email or web address]  01/07/2016, 8:07 PM

## 2016-01-07 NOTE — ED Triage Notes (Signed)
Pt reports abdominal pain that started yesterday and rectal bleeding that started this am. Pt seen this am by Dr. Willey Blade and sent to ED.

## 2016-01-07 NOTE — ED Notes (Signed)
EDP in with pt 

## 2016-01-07 NOTE — ED Provider Notes (Addendum)
[place] DEPT Provider Note   CSN: [id number] Arrival date & time: 01/07/16  1212  By signing my name below, I, Higinio Plan, attest that this documentation has been prepared under the direction and in the presence of Carmin Muskrat, MD . Electronically Signed: Higinio Plan, Scribe. 01/07/2016. 1:30 PM.  History   Chief Complaint Chief Complaint  Patient presents with  . Abdominal Pain   The history is provided by the patient. No language interpreter was used.   HPI Comments: Nancy Hicks is a 82 y.o. female who presents to the Emergency Department complaining of gradually worsening, lower abdominal pain that began yesterday and rectal bleeding that began this morning. Pt reports she had a small BM this morning but noticed a large amount of bright red blood in her stool. She notes associated subjective fever (TMAX 99.4). Pt states she visited her PCP today for her symptoms in which she had a rectal exam and told she did not have hemoorhoids. She reports hx of diverticulitis but states this is first time she has experienced symptoms of rectal bleeding. Pt denies lightheadedness, chest pain, headache, nausea and vomiting.   PCP: Dr. Willey Blade   Past Medical History:  Diagnosis Date  . Anemia, unspecified   . Anxiety state, unspecified   . Blood dyscrasia    RV factor def  . Cancer ([place])    skin  . Degeneration of cervical intervertebral disc   . Disorder of bone and cartilage, unspecified   . Diverticulosis of colon (without mention of hemorrhage) 01/13/1999  . Esophageal reflux   . Esophageal stricture   . Factor V deficiency ([place])   . Factor V deficiency ([place])   . Hiatal hernia 04/09/2010  . HTN (hypertension)   . Irritable bowel syndrome   . Neurogenic bladder   . Osteoarthrosis, unspecified whether generalized or localized, unspecified site   . Peripheral vascular disease (HCC)    pe, dvt hx  . Personal history of thrombophlebitis   . Personal history of venous  thrombosis and embolism   . Pure hypercholesterolemia   . Raynaud phenomenon   . Shortness of breath dyspnea   . Unspecified asthma(493.90)   . Unspecified disorder resulting from impaired renal function    cysts    Patient Active Problem List   Diagnosis Date Noted  . Marginal zone lymphoma of extranodal and solid organ sites [facility]) 09/09/2015  . H/O superficial parotidectomy 11/14/2014  . Diverticulitis of colon (without mention of hemorrhage)(562.11) 06/09/2011  . GERD (gastroesophageal reflux disease) 06/09/2011  . Right rib fracture 06/09/2011  . Anticoagulant long-term use 06/09/2011  . COPD with chronic bronchitis ([place]) 06/09/2011  . HH (hiatus hernia) 11/04/2010  . OTHER DYSPHAGIA 03/11/2010  . RENAL INSUFFICIENCY 04/02/2008  . IBS 01/24/2008  . HYPERCHOLESTEROLEMIA 03/14/2007  . FACTOR V DEFICIENCY 03/14/2007  . ASTHMA 03/14/2007  . NEUROGENIC BLADDER 03/14/2007  . [place] DISEASE, CERVICAL 03/14/2007  . PULMONARY EMBOLISM, HX OF 03/14/2007  . DEEP VENOUS THROMBOPHLEBITIS, HX OF 03/14/2007  . ANEMIA 01/10/2007  . ANXIETY 01/10/2007  . HYPERTENSION 01/10/2007  . DIVERTICULOSIS, COLON 01/10/2007  . DEGENERATIVE JOINT DISEASE 01/10/2007  . OSTEOPENIA 01/10/2007    Past Surgical History:  Procedure Laterality Date  . CHOLECYSTECTOMY    . ESOPHAGEAL MANOMETRY  10/26/2011   Procedure: ESOPHAGEAL MANOMETRY (EM);  Surgeon: Sable Feil, MD;  Location: [facility] ENDOSCOPY;  Service: Endoscopy;  Laterality: N/A;  . EYE SURGERY Bilateral    cataracts  . PAROTIDECTOMY Left 11/14/2014  . PAROTIDECTOMY  Left 11/14/2014   Procedure: LEFT LATERAL PAROTIDECTOMY;  Surgeon: Leta Baptist, MD;  Location: [place];  Service: ENT;  Laterality: Left;  . 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 TONSILLECTOMY      OB History    Gravida Para Term Preterm AB Living   3 2 2   1      SAB TAB Ectopic Multiple Live Births   1               Home Medications    Prior to Admission medications     Medication Sig Start Date End Date Taking? Authorizing Provider  allopurinol (ZYLOPRIM) 300 MG tablet Take 300 mg by mouth daily.      Historical Provider, MD  Biotin 5000 MCG CAPS Take 1 capsule by mouth daily.    Historical Provider, MD  Calcium Citrate-Vitamin D (CITRACAL MAXIMUM) 315-250 MG-UNIT TABS Take 1 capsule by mouth daily.    Historical Provider, MD  chlorthalidone (HYGROTON) 25 MG tablet Take 12.5 mg by mouth daily.    Historical Provider, MD  Cholecalciferol (VITAMIN D3) 2000 UNITS TABS Take 1 tablet by mouth daily.      Historical Provider, MD  diltiazem (CARDIZEM CD) 240 MG 24 hr capsule take 1 capsule by mouth once daily 06/20/14   Noralee Space, MD  esomeprazole (NEXIUM) 40 MG capsule Take 40 mg by mouth daily before breakfast.  06/09/11   Sable Feil, MD  fenofibrate 160 MG tablet take 1 tablet once daily 04/16/13   Noralee Space, MD  fluticasone [facility]) 50 MCG/ACT nasal spray instill 2 sprays into each nostril once daily 02/15/15   Noralee Space, MD  Fluticasone-Salmeterol (ADVAIR DISKUS) 100-50 MCG/DOSE AEPB inhale 1 dose by mouth twice a day 12/04/15   Noralee Space, MD  furosemide (LASIX) 20 MG tablet Take 1 tablet (20 mg total) by mouth daily. 07/19/13   Noralee Space, MD  losartan (COZAAR) 100 MG tablet take 1 tablet by mouth once daily    Noralee Space, MD  methenamine (HIPREX) 1 G tablet Take 500 mg by mouth 2 (two) times daily with a meal.  04/11/11   Historical Provider, MD  methylcellulose (CITRUCEL) oral powder Take 1 tsp by mouth daily     Historical Provider, MD  Multiple Vitamin (MULITIVITAMIN WITH MINERALS) TABS Take 1 tablet by mouth daily.    Historical Provider, MD  Polyethyl Glycol-Propyl Glycol (SYSTANE ULTRA PF) 0.4-0.3 % SOLN Place 2 drops into both eyes 3 (three) times daily.     Historical Provider, MD  potassium chloride SA (K-DUR,KLOR-CON) 20 MEQ tablet take 1 tablet by mouth once daily    Noralee Space, MD  sertraline (ZOLOFT) 50 MG tablet Take 1  tablet (50 mg total) by mouth daily. 05/29/13   Noralee Space, MD  simvastatin (ZOCOR) 10 MG tablet Take 10 mg by mouth at bedtime.      Historical Provider, MD  VENTOLIN HFA 108 (90 BASE) MCG/ACT inhaler inhale 2 puffs by mouth every 6 hours AS NEEDED FOR WHEEZING 11/09/14   Noralee Space, MD  warfarin (COUMADIN) 2.5 MG tablet Take 2.5 mg by mouth daily. Takes 2.5 mg everyday.    Historical Provider, MD    Family History Family History  Problem Relation Age of Onset  . Parkinsonism Mother   . COPD Brother   . Asthma Brother   . Lung cancer Brother   . Heart attack Brother   .  Diabetes Paternal Aunt     Social History Social History  Substance Use Topics  . Smoking status: Never Smoker  . Smokeless tobacco: Never Used  . Alcohol use No     Allergies   Metoclopramide hcl and Nitrofurantoin   Review of Systems Review of Systems  Constitutional: Positive for fever.  Gastrointestinal: Positive for abdominal pain and blood in stool. Negative for nausea and vomiting.  Neurological: Negative for light-headedness and headaches.   Physical Exam Updated Vital Signs BP 142/67 (BP Location: Left Arm)   Pulse 94   Temp 99.1 F (37.3 C) (Oral)   Resp 20   Ht 5' (1.524 m)   Wt 128 lb (58.1 kg)   SpO2 100%   BMI 25.00 kg/m   Physical Exam  Constitutional: She is oriented to person, place, and time. She appears well-developed and well-nourished. No distress.  HENT:  Head: Normocephalic and atraumatic.  Eyes: Conjunctivae and EOM are normal.  Cardiovascular: Normal rate and regular rhythm.   Pulmonary/Chest: Effort normal and breath sounds normal. No stridor. No respiratory distress.  Abdominal: She exhibits no distension. There is tenderness.  Lower abd ttp  Musculoskeletal: She exhibits no edema.  Neurological: She is alert and oriented to person, place, and time. No cranial nerve deficit.  Skin: Skin is warm and dry.  Psychiatric: She has a normal mood and affect.  Nursing  note and vitals reviewed.   ED Treatments / Results  Labs (all labs ordered are listed, but only abnormal results are displayed) Labs Reviewed  COMPREHENSIVE METABOLIC PANEL - Abnormal; Notable for the following:       Result Value   Glucose, Bld 105 (*)    BUN 40 (*)    Creatinine, Ser 1.16 (*)    GFR calc non Af Amer 41 (*)    GFR calc Af Amer 48 (*)    All other components within normal limits  PROTIME-INR - Abnormal; Notable for the following:    Prothrombin Time 26.2 (*)    All other components within normal limits  LIPASE, BLOOD  CBC  URINALYSIS, ROUTINE W REFLEX MICROSCOPIC (NOT AT [facility])   Radiology Ct Abdomen Pelvis Wo Contrast  Result Date: 01/07/2016 CLINICAL DATA:  Abdominal pain, rectal bleeding, possible diverticulitis EXAM: CT ABDOMEN AND PELVIS WITHOUT CONTRAST TECHNIQUE: Multidetector CT imaging of the abdomen and pelvis was performed following the standard protocol without IV contrast. COMPARISON:  12/21/2014 FINDINGS: Lower chest: Large hiatal hernia is noted. There is atelectasis left lower lobe posterior medially. Hepatobiliary: The study is limited without IV contrast. The patient is status postcholecystectomy. No intrahepatic biliary ductal dilatation. Pancreas: Unenhanced pancreas is unremarkable. Spleen: Unenhanced spleen is unremarkable. Adrenals/Urinary Tract: No adrenal gland mass. Exophytic cystic lesion upper pole of the left kidney is stable. Stable high-density lesion midpole of the left kidney. Stable exophytic lesion in upper pole of the right kidney. Tiny cyst in lower pole of the right kidney stable. No nephrolithiasis. No hydronephrosis or hydroureter. Stomach/Bowel: Oral contrast material was given to the patient. No small bowel obstruction. There is no pericecal inflammation. A low lying cecum is noted with tip in right anterior pelvis. Normal appendix is partially visualized. No pericecal inflammation. There is moderate contrast material and some stool  within transverse colon. Moderate stool noted within descending colon. Abundant stool noted in proximal and mid sigmoid colon. Multiple sigmoid colon diverticula. Mild redundant distal sigmoid colon. In axial image 54 there is thickening of diverticular wall and significant stranding of pericolonic  fat in sigmoid colon right pelvis. This is confirmed on coronal image 48. Findings are consistent with acute diverticulitis. There is small adjacent mesocolonic fluid collection measures about 1.7 cm. There is about 4 3 Hounsfield units in attenuation suspicious for small focal hemorrhage/ hematoma. Moderate gas noted within rectum. Vascular/Lymphatic: Atherosclerotic calcifications of abdominal aorta bilateral renal artery origin and bilateral iliac arteries are noted. No retroperitoneal or mesenteric adenopathy. Reproductive: The uterus is atrophic. No adnexal mass. No inguinal adenopathy. Other: No free abdominal air. There is significant distended urinary bladder. Musculoskeletal: No destructive bony lesions are noted. There is levoscoliosis of the lumbar spine. Extensive degenerative changes are noted thoracolumbar spine. IMPRESSION: 1. In axial image 54 there is thickening of diverticular wall and significant stranding of pericolonic fat in sigmoid colon right pelvis. This is confirmed on coronal image 48. Findings are consistent with acute diverticulitis. There is small adjacent mesocolonic fluid collection measures about 1.7 cm. There is about 4 3 Hounsfield units in attenuation suspicious for small focal hemorrhage/ hematoma. Moderate gas noted within rectum. 2. Low lying cecum.  No pericecal inflammation. 3. Abundant stool noted in proximal sigmoid colon. 4. Stable cystic lesions bilateral kidney. No nephrolithiasis. No hydronephrosis or hydroureter. There is a distended urinary bladder. No bladder filling defects are noted. 5. No small bowel obstruction. 6. Large hiatal hernia is noted. Atelectasis noted left  lower lobe posterior medially. 7. Extensive degenerative changes thoracolumbar spine. Levoscoliosis thoracolumbar spine. These results were called by telephone at the time of interpretation on 01/07/2016 at 5:07 pm to Dr. Carmin Muskrat , who verbally acknowledged these results. Electronically Signed   By: Lahoma Crocker M.D.   On: 01/07/2016 17:08    Procedures Procedures (including critical care time)  Medications Ordered in ED Medications - No data to display  DIAGNOSTIC STUDIES:  Oxygen Saturation is 100% on RA, normal by my interpretation.    COORDINATION OF CARE:  1:27 PM Discussed treatment plan with pt at bedside and pt agreed to plan.  5:58 PM I discussed patient's CT findings with our radiologist, and subsequently with our gastroenterology team. With concern for diverticulitis, possible erosion into adjacent vasculature, the patient will have consultation by GI Name antibiotics starting for diverticulitis.  On repeat exam the patient remains in similar condition, aware of all findings.  Initial Impression / Assessment and Plan / ED Course  I have reviewed the triage vital signs and the nursing notes.  Pertinent labs & imaging results that were available during my care of the patient were reviewed by me and considered in my medical decision making (see chart for details).   Patient presents with concern for abdominal pain, rectal bleeding. Patient history of diverticulitis concerning, and CT evidence here is consistent with this. However, there is also some evidence for erosion into vasculature, with hematoma identified. Patient's anticoagulation status is appropriate, and she has no additional  bleeding in the emergency department. However, with concern for the aforementioned erosion as well as diverticulitis, the patient was started on antibiotics, and after discussion with gastroenterology, admitted for further evaluation and management.     Carmin Muskrat, MD 01/07/16  1800    Carmin Muskrat, MD 01/30/16 1550

## 2016-01-08 ENCOUNTER — Encounter (HOSPITAL_COMMUNITY): Payer: Self-pay | Admitting: Gastroenterology

## 2016-01-08 ENCOUNTER — Inpatient Hospital Stay (HOSPITAL_COMMUNITY): Payer: Medicare Other

## 2016-01-08 DIAGNOSIS — K5792 Diverticulitis of intestine, part unspecified, without perforation or abscess without bleeding: Secondary | ICD-10-CM | POA: Diagnosis not present

## 2016-01-08 DIAGNOSIS — K625 Hemorrhage of anus and rectum: Secondary | ICD-10-CM | POA: Diagnosis not present

## 2016-01-08 LAB — BASIC METABOLIC PANEL
ANION GAP: 4 — AB (ref 5–15)
BUN: 29 mg/dL — ABNORMAL HIGH (ref 6–20)
CALCIUM: 8.4 mg/dL — AB (ref 8.9–10.3)
CO2: 24 mmol/L (ref 22–32)
Chloride: 107 mmol/L (ref 101–111)
Creatinine, Ser: 0.87 mg/dL (ref 0.44–1.00)
GFR, EST NON AFRICAN AMERICAN: 58 mL/min — AB (ref >60)
Glucose, Bld: 130 mg/dL — ABNORMAL HIGH (ref 65–99)
POTASSIUM: 3.9 mmol/L (ref 3.5–5.1)
Sodium: 135 mmol/L (ref 135–145)

## 2016-01-08 LAB — HEMOGLOBIN: HEMOGLOBIN: 10.8 g/dL — AB (ref 12.0–15.0)

## 2016-01-08 LAB — PROTIME-INR
INR: 2.3
Prothrombin Time: 25.7 seconds — ABNORMAL HIGH (ref 11.4–15.2)

## 2016-01-08 LAB — HEMATOCRIT: HEMATOCRIT: 33.5 % — AB (ref 36.0–46.0)

## 2016-01-08 MED ORDER — CIPROFLOXACIN IN D5W 400 MG/200ML IV SOLN
400.0000 mg | Freq: Two times a day (BID) | INTRAVENOUS | Status: DC
  Administered 2016-01-08 (×2): 400 mg via INTRAVENOUS
  Filled 2016-01-08 (×2): qty 200

## 2016-01-08 MED ORDER — WARFARIN SODIUM 5 MG PO TABS
2.5000 mg | ORAL_TABLET | Freq: Every day | ORAL | Status: DC
  Administered 2016-01-08: 2.5 mg via ORAL
  Filled 2016-01-08: qty 1

## 2016-01-08 MED ORDER — WARFARIN - PHYSICIAN DOSING INPATIENT: Freq: Every day | Status: DC

## 2016-01-08 NOTE — Consult Note (Signed)
Referring Provider: Asencion Noble, MD Primary Care Physician:  Asencion Noble, MD Primary Gastroenterologist:  Peggye Form, MD Reason for Consultation:  Diverticulitis, rectal bleeding  HPI: Nancy Hicks is a 82 y.o. 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 Patient was initially seen by Dr. Willey Blade in his office. She had quite significant diffuse lower abdominal pain and faint blood on digital rectal exam. She was sent over for urgent evaluation. Patient states that she began having lower abdominal pain couple days prior to admission. Described as moderate in intensity, somewhat crampy, entire lower abdomen. No fever or chills. No nausea or vomiting. Denies diarrhea associated with it. She has been having regular bowel movements. She does note that with passage of one stool she had bright red blood noted on the toilet tissue only. There was no blood in the toilet water. This occurred once. Subsequent stools have been looser than normal but no blood. Heartburn and dysphagia have been better controlled more lately.  Patient has a long history of GERD and dysphagia. Previously was seen by Dr. Verl Blalock until he retired. He referred her to Dr. Peggye Form at [facility] for ongoing GI care. Notably patient had a colonoscopy in January 2017 by Dr. Carlton Adam due to abnormal sigmoid colon on PET scan. Patient was anticoagulated at time of procedure. She was found to have 2 small polyps at the ileocecal valve which were completely removed. They were tubular adenomas. A larger sessile polyp at the sigmoid colon was biopsied but not removed given anticoagulation. It was a tubular adenoma. She subsequently had a flexible sigmoidoscopy in March 2017 for removal of this polyp. Again pathology revealed  tubular adenoma only.  In the emergency department patient underwent a CT of abdomen and pelvis without contrast. She had quite significant amount of stool within the descending and sigmoid colon. Mild redundant distal sigmoid colon. Thickening of diverticular wall and significant stranding of the pericolonic fat in the sigmoid colon right pelvis. Small adjacent mesocolonic fluid collection measuring 1.7 cm, questionable focal hemorrhage versus hematoma.   White blood cell count was 8900, hemoglobin 12, INR therapeutic at 2.36. This morning her hemoglobin is 10.8, INR 2.30. Prior to Admission medications   Medication Sig Start Date End Date Taking? Authorizing Provider  allopurinol (ZYLOPRIM) 300 MG tablet Take 300 mg by mouth daily.     Yes Historical Provider, MD  Biotin 5000 MCG CAPS Take 1 capsule by mouth daily.   Yes Historical Provider, MD  Calcium Citrate-Vitamin D (CITRACAL MAXIMUM) 315-250 MG-UNIT TABS Take 1 capsule by mouth daily.   Yes Historical Provider, MD  chlorthalidone (HYGROTON) 25 MG tablet Take 12.5 mg by mouth daily.   Yes Historical Provider, MD  Cholecalciferol (VITAMIN D3) 2000 UNITS TABS Take 1 tablet by mouth daily.     Yes Historical Provider, MD  diltiazem (CARDIZEM CD) 240 MG 24 hr capsule take 1 capsule by mouth once daily 06/20/14  Yes Noralee Space, MD  esomeprazole (NEXIUM) 40 MG capsule Take 40 mg by mouth daily before breakfast.  06/09/11  Yes Sable Feil, MD  fluticasone [facility]) 50 MCG/ACT nasal spray instill 2 sprays into each nostril once daily 02/15/15  Yes Noralee Space, MD  Fluticasone-Salmeterol (ADVAIR DISKUS) 100-50 MCG/DOSE AEPB inhale 1 dose by mouth twice a day 12/04/15  Yes  Noralee Space, MD  furosemide (LASIX) 20 MG tablet Take 1 tablet (20 mg total) by mouth daily. 07/19/13  Yes Noralee Space, MD  losartan (COZAAR) 100 MG tablet take 1 tablet by mouth once daily   Yes Noralee Space, MD  methenamine (HIPREX) 1 G tablet Take 500 mg by mouth 2 (two)  times daily with a meal.  04/11/11  Yes Historical Provider, MD  methylcellulose (CITRUCEL) oral powder Take 1 tsp by mouth daily    Yes Historical Provider, MD  Multiple Vitamin (MULITIVITAMIN WITH MINERALS) TABS Take 1 tablet by mouth daily.   Yes Historical Provider, MD  Polyethyl Glycol-Propyl Glycol (SYSTANE ULTRA PF) 0.4-0.3 % SOLN Place 2 drops into both eyes 3 (three) times daily.    Yes Historical Provider, MD  potassium chloride SA (K-DUR,KLOR-CON) 20 MEQ tablet take 1 tablet by mouth once daily   Yes Noralee Space, MD  sertraline (ZOLOFT) 50 MG tablet Take 1 tablet (50 mg total) by mouth daily. 05/29/13  Yes Noralee Space, MD  simvastatin (ZOCOR) 10 MG tablet Take 10 mg by mouth at bedtime.     Yes Historical Provider, MD  VENTOLIN HFA 108 (90 BASE) MCG/ACT inhaler inhale 2 puffs by mouth every 6 hours AS NEEDED FOR WHEEZING 11/09/14  Yes Noralee Space, MD  warfarin (COUMADIN) 2.5 MG tablet Take 2.5 mg by mouth daily. Takes 2.5 mg everyday.   Yes Historical Provider, MD  fenofibrate 160 MG tablet take 1 tablet once daily 04/16/13   Noralee Space, MD    Current Facility-Administered Medications  Medication Dose Route Frequency Provider Last Rate Last Dose  . acetaminophen (TYLENOL) tablet 650 mg  650 mg Oral Q6H PRN Vianne Bulls, MD       Or  . acetaminophen (TYLENOL) suppository 650 mg  650 mg Rectal Q6H PRN Ilene Qua Opyd, MD      . albuterol (PROVENTIL) (2.5 MG/3ML) 0.083% nebulizer solution 3 mL  3 mL Inhalation Q4H PRN Vianne Bulls, MD      . allopurinol (ZYLOPRIM) tablet 300 mg  300 mg Oral Daily Vianne Bulls, MD      . calcium-vitamin D (OSCAL WITH D) 500-200 MG-UNIT per tablet 1 tablet  1 tablet Oral Daily Vianne Bulls, MD      . Derrill Memo ON 01/09/2016] ciprofloxacin (CIPRO) IVPB 400 mg  400 mg Intravenous Q24H Ilene Qua Opyd, MD      . diltiazem (CARDIZEM CD) 24 hr capsule 240 mg  240 mg Oral Daily Timothy S Opyd, MD      . fluticasone (FLONASE) 50 MCG/ACT nasal spray 1  spray  1 spray Each Nare Daily Ilene Qua Opyd, MD      . HYDROcodone-acetaminophen (NORCO/VICODIN) 5-325 MG per tablet 1-2 tablet  1-2 tablet Oral Q4H PRN Vianne Bulls, MD   2 tablet at 01/08/16 0612  . metroNIDAZOLE (FLAGYL) IVPB 500 mg  500 mg Intravenous Q8H Ilene Qua Opyd, MD   500 mg at 01/08/16 0200  . mometasone-formoterol (DULERA) 100-5 MCG/ACT inhaler 2 puff  2 puff Inhalation BID Vianne Bulls, MD      . multivitamin with minerals tablet 1 tablet  1 tablet Oral Daily Timothy S Opyd, MD      . ondansetron (ZOFRAN) tablet 4 mg  4 mg Oral Q6H PRN Vianne Bulls, MD       Or  . ondansetron (ZOFRAN) injection 4 mg  4 mg Intravenous Q6H PRN  Vianne Bulls, MD      . pantoprazole (PROTONIX) EC tablet 40 mg  40 mg Oral Daily Ilene Qua Opyd, MD      . polyvinyl alcohol (LIQUIFILM TEARS) 1.4 % ophthalmic solution 2 drop  2 drop Both Eyes TID Ilene Qua Opyd, MD      . sertraline (ZOLOFT) tablet 50 mg  50 mg Oral Daily Ilene Qua Opyd, MD      . simvastatin (ZOCOR) tablet 10 mg  10 mg Oral QHS Vianne Bulls, MD   10 mg at 01/07/16 2211  . warfarin (COUMADIN) tablet 2.5 mg  2.5 mg Oral q1800 Asencion Noble, MD        Allergies as of 01/07/2016 - Review Complete 01/07/2016  Allergen Reaction Noted  . Metoclopramide hcl Other (See Comments)   . Nitrofurantoin Nausea And Vomiting and Other (See Comments)     Past Medical History:  Diagnosis Date  . Anemia, unspecified   . Anxiety state, unspecified   . Blood dyscrasia    RV factor def  . Cancer ([place])    skin  . Degeneration of cervical intervertebral disc   . Disorder of bone and cartilage, unspecified   . Diverticulosis of colon (without mention of hemorrhage) 01/13/1999  . Esophageal reflux   . Esophageal stricture   . Factor V deficiency ([place])   . Factor V deficiency ([place])   . Hiatal hernia 04/09/2010   ? Paraesophageal component on chest CT 2006  . HTN (hypertension)   . Irritable bowel syndrome   . Neurogenic bladder   .  Osteoarthrosis, unspecified whether generalized or localized, unspecified site   . Peripheral vascular disease ([facility])    pe, dvt hx  . Personal history of thrombophlebitis   . Personal history of venous thrombosis and embolism   . Primary lymphoma of parotid gland ([place]) 2016  . Pure hypercholesterolemia   . Raynaud phenomenon   . Shortness of breath dyspnea   . Unspecified asthma(493.90)   . Unspecified disorder resulting from impaired renal function    cysts    Past Surgical History:  Procedure Laterality Date  . CHOLECYSTECTOMY    . COLONOSCOPY  2000   Dr. Verl Blalock: moderate to severe sigmoid diverticulosis  . COLONOSCOPY  03/2015   Dr. Peggye Form at [facility]: TCS for abnormal sigmoid colon on PET. Moderate diverticulosis of the sigmoid colon, sessile 8-9 mm sigmoid: Polyp biopsied (given anticoagulation), two 3 mm ICV polyps removed. All polyps were tubular adenomas.  . ESOPHAGEAL MANOMETRY  10/26/2011   Procedure: ESOPHAGEAL MANOMETRY (EM);  Surgeon: Sable Feil, MD;  Location: [facility] ENDOSCOPY;  Service: Endoscopy;  Laterality: N/A;  . ESOPHAGOGASTRODUODENOSCOPY  03/2010   Dr. Verl Blalock: candida esophagitis, prolapsing hh with cameron lesions, multiple polyps  . EYE SURGERY Bilateral    cataracts  . Flexible sigmoidoscopy  05/2015   Dr. Peggye [facility] at [facility]: Performed off anticoagulation. 1-1.5 cm sessile sigmoid polyp removed, site tattooed. Pathology revealed tubular adenoma. Colonoscopy in 3-5 years as medically appropriate.  Marland Kitchen PAROTIDECTOMY Left 11/14/2014  . PAROTIDECTOMY Left 11/14/2014   Procedure: LEFT LATERAL PAROTIDECTOMY;  Surgeon: Leta Baptist, MD;  Location: [place];  Service: ENT;  Laterality: Left;  . 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 TONSILLECTOMY      Family History  Problem Relation Age of Onset  . Parkinsonism Mother   . COPD Brother   . Asthma Brother   . Lung cancer Brother   .  Heart attack Brother   . Diabetes Paternal Aunt     Social  History   Social History  . Marital status: Widowed    Spouse name: N/A  . Number of children: 2  . Years of education: N/A   Occupational History  . retired   .  Retired   Social History Main Topics  . Smoking status: Never Smoker  . Smokeless tobacco: Never Used  . Alcohol use No  . Drug use: No  . Sexual activity: Not on file   Other Topics Concern  . Not on file   Social History Narrative  . No narrative on file     ROS:  General: Negative for anorexia, weight loss, fever, chills, fatigue, weakness. Eyes: Negative for vision changes.  ENT: Negative for hoarseness, difficulty swallowing , nasal congestion. CV: Negative for chest pain, angina, palpitations, dyspnea on exertion, peripheral edema.  Respiratory: Negative for dyspnea at rest, dyspnea on exertion, cough, sputum, wheezing.  GI: See history of present illness. GU:  Negative for dysuria, hematuria, urinary incontinence, urinary frequency, nocturnal urination.  MS: Positive right shoulder pain no low back pain.  Derm: Negative for rash or itching.  Neuro: Negative for weakness, abnormal sensation, seizure, frequent headaches, memory loss, confusion.  Psych: Negative for anxiety, depression, suicidal ideation, hallucinations.  Endo: Negative for unusual weight change.  Heme: Negative for bruising or bleeding. Allergy: Negative for rash or hives.       Physical Examination: Vital signs in last 24 hours: Temp:  [96.7 F (35.9 C)-99.1 F (37.3 C)] 98.8 F (37.1 C) (10/11 0658) Pulse Rate:  [72-94] 72 (10/11 0658) Resp:  [20] 20 (10/11 0658) BP: (142-164)/(55-78) 147/55 (10/11 0658) SpO2:  [95 %-100 %] 96 % (10/11 0658) Weight:  [128 lb (58.1 kg)-132 lb 15 oz (60.3 kg)] 132 lb 15 oz (60.3 kg) (10/10 2132)    General: Well-nourished, well-developed in no acute distress. Appears younger than stated age Head: Normocephalic, atraumatic.   Eyes: Conjunctiva pink, no icterus. Mouth: Oropharyngeal mucosa  moist and pink , no lesions erythema or exudate. Neck: Supple without thyromegaly, masses, or lymphadenopathy.  Lungs: Clear to auscultation bilaterally.  Heart: Regular rate and rhythm, no murmurs rubs or gallops.  Abdomen: Bowel sounds are normal, nontender, nondistended, no hepatosplenomegaly or masses, no abdominal bruits or    hernia , no rebound or guarding.   Rectal: Discussed with Dr. Willey Blade, DRE by him with scant amount of bright red blood Extremities: No lower extremity edema, clubbing, deformity.  Neuro: Alert and oriented x 4 , grossly normal neurologically.  Skin: Warm and dry, no rash or jaundice.   Psych: Alert and cooperative, normal mood and affect.        Intake/Output from previous day: 10/10 0701 - 10/11 0700 In: 200 [IV Piggyback:200] Out: -  Intake/Output this shift: No intake/output data recorded.  Lab Results: CBC  Recent Labs  01/07/16 1247 01/07/16 2241 01/08/16 0655  WBC 8.9  --   --   HGB 12.0 11.6* 10.8*  HCT 36.6 35.1* 33.5*  MCV 94.6  --   --   PLT 202  --   --    BMET  Recent Labs  01/07/16 1247 01/08/16 0655  NA 137 135  K 4.2 3.9  CL 106 107  CO2 24 24  GLUCOSE 105* 130*  BUN 40* 29*  CREATININE 1.16* 0.87  CALCIUM 9.3 8.4*   LFT  Recent Labs  01/07/16 1247  BILITOT 0.8  ALKPHOS 93  AST 24  ALT 14  PROT 7.0  ALBUMIN 3.8    Lipase  Recent Labs  01/07/16 1247  LIPASE 33    PT/INR  Recent Labs  01/07/16 1306 01/08/16 0655  LABPROT 26.2* 25.7*  INR 2.36 2.30      Imaging Studies: Ct Abdomen Pelvis Wo Contrast  Result Date: 01/07/2016 CLINICAL DATA:  Abdominal pain, rectal bleeding, possible diverticulitis EXAM: CT ABDOMEN AND PELVIS WITHOUT CONTRAST TECHNIQUE: Multidetector CT imaging of the abdomen and pelvis was performed following the standard protocol without IV contrast. COMPARISON:  12/21/2014 FINDINGS: Lower chest: Large hiatal hernia is noted. There is atelectasis left lower lobe posterior  medially. Hepatobiliary: The study is limited without IV contrast. The patient is status postcholecystectomy. No intrahepatic biliary ductal dilatation. Pancreas: Unenhanced pancreas is unremarkable. Spleen: Unenhanced spleen is unremarkable. Adrenals/Urinary Tract: No adrenal gland mass. Exophytic cystic lesion upper pole of the left kidney is stable. Stable high-density lesion midpole of the left kidney. Stable exophytic lesion in upper pole of the right kidney. Tiny cyst in lower pole of the right kidney stable. No nephrolithiasis. No hydronephrosis or hydroureter. Stomach/Bowel: Oral contrast material was given to the patient. No small bowel obstruction. There is no pericecal inflammation. A low lying cecum is noted with tip in right anterior pelvis. Normal appendix is partially visualized. No pericecal inflammation. There is moderate contrast material and some stool within transverse colon. Moderate stool noted within descending colon. Abundant stool noted in proximal and mid sigmoid colon. Multiple sigmoid colon diverticula. Mild redundant distal sigmoid colon. In axial image 54 there is thickening of diverticular wall and significant stranding of pericolonic fat in sigmoid colon right pelvis. This is confirmed on coronal image 48. Findings are consistent with acute diverticulitis. There is small adjacent mesocolonic fluid collection measures about 1.7 cm. There is about 4 3 Hounsfield units in attenuation suspicious for small focal hemorrhage/ hematoma. Moderate gas noted within rectum. Vascular/Lymphatic: Atherosclerotic calcifications of abdominal aorta bilateral renal artery origin and bilateral iliac arteries are noted. No retroperitoneal or mesenteric adenopathy. Reproductive: The uterus is atrophic. No adnexal mass. No inguinal adenopathy. Other: No free abdominal air. There is significant distended urinary bladder. Musculoskeletal: No destructive bony lesions are noted. There is levoscoliosis of the  lumbar spine. Extensive degenerative changes are noted thoracolumbar spine. IMPRESSION: 1. In axial image 54 there is thickening of diverticular wall and significant stranding of pericolonic fat in sigmoid colon right pelvis. This is confirmed on coronal image 48. Findings are consistent with acute diverticulitis. There is small adjacent mesocolonic fluid collection measures about 1.7 cm. There is about 4 3 Hounsfield units in attenuation suspicious for small focal hemorrhage/ hematoma. Moderate gas noted within rectum. 2. Low lying cecum.  No pericecal inflammation. 3. Abundant stool noted in proximal sigmoid colon. 4. Stable cystic lesions bilateral kidney. No nephrolithiasis. No hydronephrosis or hydroureter. There is a distended urinary bladder. No bladder filling defects are noted. 5. No small bowel obstruction. 6. Large hiatal hernia is noted. Atelectasis noted left lower lobe posterior medially. 7. Extensive degenerative changes thoracolumbar spine. Levoscoliosis thoracolumbar spine. These results were called by telephone at the time of interpretation on 01/07/2016 at 5:07 pm to Dr. Carmin Muskrat , who verbally acknowledged these results. Electronically Signed   By: Lahoma Crocker M.D.   On: 01/07/2016 17:08  [4 week]   Impression: 82 year old female with acute onset lower abdominal pain associated with single episode of toilet tissue hematochezia in the setting of chronic anticoagulation for factor  V eficiency/remote PE. Oral contrast only CT abdomen and pelvis showed acute sigmoid diverticulitis/right pelvis with small adjacent mesocolonic fluid collection measuring 1.7 cm question small focal hemorrhage/hematoma on initial CT read. Dr. Oneida Alar reviewed CT films with radiologist and the abnormality adjacent to the diverticulitis possibly postmenopausal ovary.  Patient remains on Coumadin at this time. She's had no further bleeding. Her abdominal pain is improving. Hemoglobin slightly down likely  dilutional.   Plan: 1. Continue IV antibiotic therapy with Cipro/Flagyl. 2. Clear liquid diet. 3. Agree with continuing Coumadin therapy as the benefits outweigh the risk of bleeding at this point. She has had no further rectal bleeding and described toilet tissue hematochezia only. Monitor closely for further bleeding. 4. We will continue to follow closely with you.  We would like to thank you for the opportunity to participate in the care of Jason F Banwart.  Laureen Ochs. Bernarda Caffey [facility] [phone] 10/11/20179:11 AM     LOS: 1 day

## 2016-01-08 NOTE — Care Management Note (Signed)
Case Management Note  Patient Details  Name: Nancy Hicks MRN: [id number] Date of Birth: 16-Sep-1926  Subjective/Objective:                  Pt admitted with diverticulitis. She is from home, lives alone and is ind with ADL's. She has PCP, transportation and no difficulty affording her medications. She has no HH services PTA. She plans to return home with self care at DC.    Action/Plan: Anticipate DC home tomorrow. No CM needs anticipated.   Expected Discharge Date:     01/08/2016             Expected Discharge Plan:  Home/Self Care  In-House Referral:  NA  Discharge planning Services  CM Consult  Post Acute Care Choice:  NA Choice offered to:  NA  DME Arranged:    DME Agency:     HH Arranged:    HH Agency:     Status of Service:  Completed, signed off  If discussed at [employer] of Stay Meetings, dates discussed:    Additional Comments:  Sherald Barge, RN 01/08/2016, 1:25 PM

## 2016-01-08 NOTE — Care Management CC44 (Signed)
Condition Code 44 Documentation Completed  Patient Details  Name: Nancy Hicks MRN: [id number] Date of Birth: 25-Dec-1926   Condition Code 44 given:  Yes Patient signature on Condition Code 44 notice:  Yes Documentation of 2 MD's agreement:  Yes Code 44 added to claim:  Yes    Sherald Barge, RN 01/08/2016, 1:22 PM

## 2016-01-08 NOTE — Progress Notes (Signed)
Pt has expressed concern about taking the dulera opposed to her home regimen with advair. She stated that she has been having coughing spells since taking the [facility] this am. She does not respond that way with her Advair. She also stated she felt faint and jittery directly after taking medication. Please discontinue Dulera and let her proceed with her home Advair.

## 2016-01-08 NOTE — Progress Notes (Signed)
Patient c/o severe right shoulder patient that started the morning of admission.  Would like medical staff to be aware.

## 2016-01-08 NOTE — Progress Notes (Signed)
Subjective: She notes no change with her abdominal pain yet. She has had no further rectal bleeding. Her primary complaint now is pain in her right shoulder.  Objective: Vital signs in last 24 hours: Vitals:   01/07/16 1816 01/07/16 2000 01/07/16 2132 01/08/16 0658  BP: 160/75 164/77 (!) 162/67 (!) 147/55  Pulse: 77 72 76 72  Resp: 20 20 20 20   Temp: 98.3 F (36.8 C)  (!) 96.7 F (35.9 C) 98.8 F (37.1 C)  TempSrc: Oral  Oral Oral  SpO2: 98% 99% 99% 96%  Weight:   132 lb 15 oz (60.3 kg)   Height:   5' (1.524 m)    Weight change:   Intake/Output Summary (Last 24 hours) at 01/08/16 0739 Last data filed at 01/07/16 2009  Gross per 24 hour  Intake              200 ml  Output                0 ml  Net              200 ml    Physical Exam: Alert. No distress. Lungs clear. Heart regular with no murmurs. Abdomen tender in both lower quadrants. Mildly distended. Bowel sounds are normal. Extremities reveal no edema. There is tenderness in the right posterior shoulder on palpation. Range of motion is limited due to pain.  Lab Results:    Results for orders placed or performed during the hospital encounter of 01/07/16 (from the past 24 hour(s))  Lipase, blood     Status: None   Collection Time: 01/07/16 12:47 PM  Result Value Ref Range   Lipase 33 11 - 51 U/L  Comprehensive metabolic panel     Status: Abnormal   Collection Time: 01/07/16 12:47 PM  Result Value Ref Range   Sodium 137 135 - 145 mmol/L   Potassium 4.2 3.5 - 5.1 mmol/L   Chloride 106 101 - 111 mmol/L   CO2 24 22 - 32 mmol/L   Glucose, Bld 105 (H) 65 - 99 mg/dL   BUN 40 (H) 6 - 20 mg/dL   Creatinine, Ser 1.16 (H) 0.44 - 1.00 mg/dL   Calcium 9.3 8.9 - 10.3 mg/dL   Total Protein 7.0 6.5 - 8.1 g/dL   Albumin 3.8 3.5 - 5.0 g/dL   AST 24 15 - 41 U/L   ALT 14 14 - 54 U/L   Alkaline Phosphatase 93 38 - 126 U/L   Total Bilirubin 0.8 0.3 - 1.2 mg/dL   GFR calc non Af Amer 41 (L) >60 mL/min   GFR calc Af Amer 48 (L) >60  mL/min   Anion gap 7 5 - 15  CBC     Status: None   Collection Time: 01/07/16 12:47 PM  Result Value Ref Range   WBC 8.9 4.0 - 10.5 K/uL   RBC 3.87 3.87 - 5.11 MIL/uL   Hemoglobin 12.0 12.0 - 15.0 g/dL   HCT 36.6 36.0 - 46.0 %   MCV 94.6 78.0 - 100.0 fL   MCH 31.0 26.0 - 34.0 pg   MCHC 32.8 30.0 - 36.0 g/dL   RDW 14.9 11.5 - 15.5 %   Platelets 202 150 - 400 K/uL  Protime-INR     Status: Abnormal   Collection Time: 01/07/16  1:06 PM  Result Value Ref Range   Prothrombin Time 26.2 (H) 11.4 - 15.2 seconds   INR 2.36   Urinalysis, Routine w reflex microscopic  Status: None   Collection Time: 01/07/16  5:15 PM  Result Value Ref Range   Color, Urine YELLOW YELLOW   APPearance CLEAR CLEAR   Specific Gravity, Urine 1.010 1.005 - 1.030   pH 6.0 5.0 - 8.0   Glucose, UA NEGATIVE NEGATIVE mg/dL   Hgb urine dipstick NEGATIVE NEGATIVE   Bilirubin Urine NEGATIVE NEGATIVE   Ketones, ur NEGATIVE NEGATIVE mg/dL   Protein, ur NEGATIVE NEGATIVE mg/dL   Nitrite NEGATIVE NEGATIVE   Leukocytes, UA NEGATIVE NEGATIVE  Type and screen [facility]     Status: None   Collection Time: 01/07/16  8:20 PM  Result Value Ref Range   ABO/RH(D) O POS    Antibody Screen NEG    Sample Expiration 01/10/2016   Hemoglobin     Status: Abnormal   Collection Time: 01/07/16 10:41 PM  Result Value Ref Range   Hemoglobin 11.6 (L) 12.0 - 15.0 g/dL  Hematocrit     Status: Abnormal   Collection Time: 01/07/16 10:41 PM  Result Value Ref Range   HCT 35.1 (L) 36.0 - 46.0 %  Basic metabolic panel     Status: Abnormal   Collection Time: 01/08/16  6:55 AM  Result Value Ref Range   Sodium 135 135 - 145 mmol/L   Potassium 3.9 3.5 - 5.1 mmol/L   Chloride 107 101 - 111 mmol/L   CO2 24 22 - 32 mmol/L   Glucose, Bld 130 (H) 65 - 99 mg/dL   BUN 29 (H) 6 - 20 mg/dL   Creatinine, Ser 1.61 0.44 - 1.00 mg/dL   Calcium 8.4 (L) 8.9 - 10.3 mg/dL   GFR calc non Af Amer 58 (L) >60 mL/min   GFR calc Af Amer >60 >60  mL/min   Anion gap 4 (L) 5 - 15  Protime-INR     Status: Abnormal   Collection Time: 01/08/16  6:55 AM  Result Value Ref Range   Prothrombin Time 25.7 (H) 11.4 - 15.2 seconds   INR 2.30   Hemoglobin     Status: Abnormal   Collection Time: 01/08/16  6:55 AM  Result Value Ref Range   Hemoglobin 10.8 (L) 12.0 - 15.0 g/dL  Hematocrit     Status: Abnormal   Collection Time: 01/08/16  6:55 AM  Result Value Ref Range   HCT 33.5 (L) 36.0 - 46.0 %     ABGS No results for input(s): PHART, PO2ART, TCO2, HCO3 in the last 72 hours.  Invalid input(s): PCO2 CULTURES No results found for this or any previous visit (from the past 240 hour(s)). Studies/Results: Ct Abdomen Pelvis Wo Contrast  Result Date: 01/07/2016 CLINICAL DATA:  Abdominal pain, rectal bleeding, possible diverticulitis EXAM: CT ABDOMEN AND PELVIS WITHOUT CONTRAST TECHNIQUE: Multidetector CT imaging of the abdomen and pelvis was performed following the standard protocol without IV contrast. COMPARISON:  12/21/2014 FINDINGS: Lower chest: Large hiatal hernia is noted. There is atelectasis left lower lobe posterior medially. Hepatobiliary: The study is limited without IV contrast. The patient is status postcholecystectomy. No intrahepatic biliary ductal dilatation. Pancreas: Unenhanced pancreas is unremarkable. Spleen: Unenhanced spleen is unremarkable. Adrenals/Urinary Tract: No adrenal gland mass. Exophytic cystic lesion upper pole of the left kidney is stable. Stable high-density lesion midpole of the left kidney. Stable exophytic lesion in upper pole of the right kidney. Tiny cyst in lower pole of the right kidney stable. No nephrolithiasis. No hydronephrosis or hydroureter. Stomach/Bowel: Oral contrast material was given to the patient. No small bowel obstruction. There  is no pericecal inflammation. A low lying cecum is noted with tip in right anterior pelvis. Normal appendix is partially visualized. No pericecal inflammation. There is  moderate contrast material and some stool within transverse colon. Moderate stool noted within descending colon. Abundant stool noted in proximal and mid sigmoid colon. Multiple sigmoid colon diverticula. Mild redundant distal sigmoid colon. In axial image 54 there is thickening of diverticular wall and significant stranding of pericolonic fat in sigmoid colon right pelvis. This is confirmed on coronal image 48. Findings are consistent with acute diverticulitis. There is small adjacent mesocolonic fluid collection measures about 1.7 cm. There is about 4 3 Hounsfield units in attenuation suspicious for small focal hemorrhage/ hematoma. Moderate gas noted within rectum. Vascular/Lymphatic: Atherosclerotic calcifications of abdominal aorta bilateral renal artery origin and bilateral iliac arteries are noted. No retroperitoneal or mesenteric adenopathy. Reproductive: The uterus is atrophic. No adnexal mass. No inguinal adenopathy. Other: No free abdominal air. There is significant distended urinary bladder. Musculoskeletal: No destructive bony lesions are noted. There is levoscoliosis of the lumbar spine. Extensive degenerative changes are noted thoracolumbar spine. IMPRESSION: 1. In axial image 54 there is thickening of diverticular wall and significant stranding of pericolonic fat in sigmoid colon right pelvis. This is confirmed on coronal image 48. Findings are consistent with acute diverticulitis. There is small adjacent mesocolonic fluid collection measures about 1.7 cm. There is about 4 3 Hounsfield units in attenuation suspicious for small focal hemorrhage/ hematoma. Moderate gas noted within rectum. 2. Low lying cecum.  No pericecal inflammation. 3. Abundant stool noted in proximal sigmoid colon. 4. Stable cystic lesions bilateral kidney. No nephrolithiasis. No hydronephrosis or hydroureter. There is a distended urinary bladder. No bladder filling defects are noted. 5. No small bowel obstruction. 6. Large hiatal  hernia is noted. Atelectasis noted left lower lobe posterior medially. 7. Extensive degenerative changes thoracolumbar spine. Levoscoliosis thoracolumbar spine. These results were called by telephone at the time of interpretation on 01/07/2016 at 5:07 pm to Dr. Carmin Muskrat , who verbally acknowledged these results. Electronically Signed   By: Lahoma Crocker M.D.   On: 01/07/2016 17:08   Micro Results: No results found for this or any previous visit (from the past 240 hour(s)). Studies/Results: Ct Abdomen Pelvis Wo Contrast  Result Date: 01/07/2016 CLINICAL DATA:  Abdominal pain, rectal bleeding, possible diverticulitis EXAM: CT ABDOMEN AND PELVIS WITHOUT CONTRAST TECHNIQUE: Multidetector CT imaging of the abdomen and pelvis was performed following the standard protocol without IV contrast. COMPARISON:  12/21/2014 FINDINGS: Lower chest: Large hiatal hernia is noted. There is atelectasis left lower lobe posterior medially. Hepatobiliary: The study is limited without IV contrast. The patient is status postcholecystectomy. No intrahepatic biliary ductal dilatation. Pancreas: Unenhanced pancreas is unremarkable. Spleen: Unenhanced spleen is unremarkable. Adrenals/Urinary Tract: No adrenal gland mass. Exophytic cystic lesion upper pole of the left kidney is stable. Stable high-density lesion midpole of the left kidney. Stable exophytic lesion in upper pole of the right kidney. Tiny cyst in lower pole of the right kidney stable. No nephrolithiasis. No hydronephrosis or hydroureter. Stomach/Bowel: Oral contrast material was given to the patient. No small bowel obstruction. There is no pericecal inflammation. A low lying cecum is noted with tip in right anterior pelvis. Normal appendix is partially visualized. No pericecal inflammation. There is moderate contrast material and some stool within transverse colon. Moderate stool noted within descending colon. Abundant stool noted in proximal and mid sigmoid colon.  Multiple sigmoid colon diverticula. Mild redundant distal sigmoid colon. In axial  image 54 there is thickening of diverticular wall and significant stranding of pericolonic fat in sigmoid colon right pelvis. This is confirmed on coronal image 48. Findings are consistent with acute diverticulitis. There is small adjacent mesocolonic fluid collection measures about 1.7 cm. There is about 4 3 Hounsfield units in attenuation suspicious for small focal hemorrhage/ hematoma. Moderate gas noted within rectum. Vascular/Lymphatic: Atherosclerotic calcifications of abdominal aorta bilateral renal artery origin and bilateral iliac arteries are noted. No retroperitoneal or mesenteric adenopathy. Reproductive: The uterus is atrophic. No adnexal mass. No inguinal adenopathy. Other: No free abdominal air. There is significant distended urinary bladder. Musculoskeletal: No destructive bony lesions are noted. There is levoscoliosis of the lumbar spine. Extensive degenerative changes are noted thoracolumbar spine. IMPRESSION: 1. In axial image 54 there is thickening of diverticular wall and significant stranding of pericolonic fat in sigmoid colon right pelvis. This is confirmed on coronal image 48. Findings are consistent with acute diverticulitis. There is small adjacent mesocolonic fluid collection measures about 1.7 cm. There is about 4 3 Hounsfield units in attenuation suspicious for small focal hemorrhage/ hematoma. Moderate gas noted within rectum. 2. Low lying cecum.  No pericecal inflammation. 3. Abundant stool noted in proximal sigmoid colon. 4. Stable cystic lesions bilateral kidney. No nephrolithiasis. No hydronephrosis or hydroureter. There is a distended urinary bladder. No bladder filling defects are noted. 5. No small bowel obstruction. 6. Large hiatal hernia is noted. Atelectasis noted left lower lobe posterior medially. 7. Extensive degenerative changes thoracolumbar spine. Levoscoliosis thoracolumbar spine. These  results were called by telephone at the time of interpretation on 01/07/2016 at 5:07 pm to Dr. Carmin Muskrat , who verbally acknowledged these results. Electronically Signed   By: Lahoma Crocker M.D.   On: 01/07/2016 17:08   Medications:  I have reviewed the patient's current medications Scheduled Meds: . allopurinol  300 mg Oral Daily  . calcium-vitamin D  1 tablet Oral Daily  . [START ON 01/09/2016] ciprofloxacin  400 mg Intravenous Q24H  . diltiazem  240 mg Oral Daily  . fluticasone  1 spray Each Nare Daily  . metronidazole  500 mg Intravenous Q8H  . mometasone-formoterol  2 puff Inhalation BID  . multivitamin with minerals  1 tablet Oral Daily  . pantoprazole  40 mg Oral Daily  . polyvinyl alcohol  2 drop Both Eyes TID  . sertraline  50 mg Oral Daily  . simvastatin  10 mg Oral QHS  . warfarin  2.5 mg Oral q1800   Continuous Infusions:  PRN Meds:.acetaminophen **OR** acetaminophen, albuterol, HYDROcodone-acetaminophen, ondansetron **OR** ondansetron (ZOFRAN) IV   Assessment/Plan: #1. Diverticulitis. No perforation by CT. Continue Cipro and Flagyl. White count normal. 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 Coumadin dose was held yesterday. Hemoglobin is 10.8. Continue Coumadin. #3. Right shoulder pain. X-ray chest and right shoulder. #4. Chronic kidney disease stage III. Stable. Repeat metabolic profile tomorrow. Principal Problem:   Acute diverticulitis Active Problems:   Hypertension   PULMONARY EMBOLISM, HX OF   GERD (gastroesophageal reflux disease)   COPD with chronic bronchitis (HCC)   Bright red rectal bleeding   Factor V deficiency (HCC)   CKD (chronic kidney disease), stage II   Diverticulitis   Sigmoid diverticulitis     LOS: 1 day   Xue Low 01/08/2016, 7:39 AM

## 2016-01-09 ENCOUNTER — Other Ambulatory Visit (HOSPITAL_COMMUNITY): Payer: Medicare Other

## 2016-01-09 ENCOUNTER — Ambulatory Visit (HOSPITAL_COMMUNITY): Payer: Medicare Other

## 2016-01-09 DIAGNOSIS — M25511 Pain in right shoulder: Secondary | ICD-10-CM

## 2016-01-09 LAB — CBC
HEMATOCRIT: 35.7 % — AB (ref 36.0–46.0)
HEMOGLOBIN: 11.7 g/dL — AB (ref 12.0–15.0)
MCH: 31 pg (ref 26.0–34.0)
MCHC: 32.8 g/dL (ref 30.0–36.0)
MCV: 94.4 fL (ref 78.0–100.0)
Platelets: 201 10*3/uL (ref 150–400)
RBC: 3.78 MIL/uL — ABNORMAL LOW (ref 3.87–5.11)
RDW: 14.8 % (ref 11.5–15.5)
WBC: 9.3 10*3/uL (ref 4.0–10.5)

## 2016-01-09 LAB — PROTIME-INR
INR: 2.24
Prothrombin Time: 25.2 seconds — ABNORMAL HIGH (ref 11.4–15.2)

## 2016-01-09 MED ORDER — METRONIDAZOLE 500 MG PO TABS: 500.0000 mg | ORAL_TABLET | Freq: Three times a day (TID) | ORAL | 0 refills | Status: DC

## 2016-01-09 MED ORDER — CIPROFLOXACIN HCL 500 MG PO TABS: 500.0000 mg | ORAL_TABLET | Freq: Two times a day (BID) | ORAL | 0 refills | Status: DC

## 2016-01-09 NOTE — Discharge Summary (Signed)
Physician Discharge Summary  Nancy Hicks [id number] DOB: May 16, 1926 DOA: 01/07/2016   Admit date: 01/07/2016 Discharge date: 01/09/2016  Discharge Diagnoses:  Principal Problem:   Acute diverticulitis Active Problems:   Hypertension   PULMONARY EMBOLISM, HX OF   GERD (gastroesophageal reflux disease)   COPD with chronic bronchitis (HCC)   Bright red rectal bleeding   Factor V deficiency (HCC)   CKD (chronic kidney disease), stage II   Diverticulitis   Sigmoid diverticulitis    Wt Readings from Last 3 Encounters:  01/07/16 132 lb 15 oz (60.3 kg)  12/04/15 136 lb (61.7 kg)  09/09/15 135 lb 3.2 oz (61.3 kg)     Hospital Course:  This patient is an 82 year old female who presented with abdominal pain and rectal bleeding. Symptoms had been present for 1 day. There was no fever or evidence of hypotension. She underwent CT scan of the abdomen which revealed diverticulitis without perforation. She was anticoagulated chronically with Coumadin for history of DVT/PE. Coumadin was held. Bleeding stopped. She was treated with ciprofloxacin and metronidazole. Her white count was normal. She had no fever while hospitalized. Her abdominal pain gradually improved. There was no further bleeding. Coumadin was restarted. INR is 2.2. Hemoglobin is 11.7. She was seen in consultation by GI.  She complained of right shoulder pain. X-rays revealed no fracture. Chest x-ray revealed no acute abnormality. This was felt to be likely related to muscular strain. She will be treated with acetaminophen.  She was much improved and stable for discharge on the morning of 10/12. She will be seen in follow-up in my office in one week. Antibiotics have been modified to the oral route. Coumadin follow-up will be arranged through the office.   Discharge Instructions     Medication List    STOP taking these medications   fenofibrate 160 MG tablet     TAKE these medications   allopurinol 300 MG  tablet Commonly known as:  ZYLOPRIM Take 300 mg by mouth daily.   Biotin 5000 MCG Caps Take 1 capsule by mouth daily.   chlorthalidone 25 MG tablet Commonly known as:  HYGROTON Take 12.5 mg by mouth daily.   ciprofloxacin 500 MG tablet Commonly known as:  CIPRO Take 1 tablet (500 mg total) by mouth 2 (two) times daily.   CITRACAL MAXIMUM 315-250 MG-UNIT Tabs Generic drug:  Calcium Citrate-Vitamin D Take 1 capsule by mouth daily.   CITRUCEL oral powder Generic drug:  methylcellulose Take 1 tsp by mouth daily   diltiazem 240 MG 24 hr capsule Commonly known as:  CARDIZEM CD take 1 capsule by mouth once daily   esomeprazole 40 MG capsule Commonly known as:  NEXIUM Take 40 mg by mouth daily before breakfast.   fluticasone 50 MCG/ACT nasal spray Commonly known as:  FLONASE instill 2 sprays into each nostril once daily   Fluticasone-Salmeterol 100-50 MCG/DOSE Aepb Commonly known as:  ADVAIR DISKUS inhale 1 dose by mouth twice a day   furosemide 20 MG tablet Commonly known as:  LASIX Take 1 tablet (20 mg total) by mouth daily.   losartan 100 MG tablet Commonly known as:  COZAAR take 1 tablet by mouth once daily   methenamine 1 g tablet Commonly known as:  HIPREX Take 500 mg by mouth 2 (two) times daily with a meal.   metroNIDAZOLE 500 MG tablet Commonly known as:  FLAGYL Take 1 tablet (500 mg total) by mouth 3 (three) times daily.   multivitamin with minerals Tabs tablet Take  1 tablet by mouth daily.   potassium chloride SA 20 MEQ tablet Commonly known as:  K-DUR,KLOR-CON take 1 tablet by mouth once daily   sertraline 50 MG tablet Commonly known as:  ZOLOFT Take 1 tablet (50 mg total) by mouth daily.   simvastatin 10 MG tablet Commonly known as:  ZOCOR Take 10 mg by mouth at bedtime.   SYSTANE ULTRA PF 0.4-0.3 % Soln Generic drug:  Polyethyl Glycol-Propyl Glycol Place 2 drops into both eyes 3 (three) times daily.   VENTOLIN HFA 108 (90 Base) MCG/ACT  inhaler Generic drug:  albuterol inhale 2 puffs by mouth every 6 hours AS NEEDED FOR WHEEZING   Vitamin D3 2000 units Tabs Take 1 tablet by mouth daily.   warfarin 2.5 MG tablet Commonly known as:  COUMADIN Take 2.5 mg by mouth daily. Takes 2.5 mg everyday.        Romey Cohea 01/09/2016

## 2016-01-09 NOTE — Consult Note (Signed)
Reason for Consult: Right shoulder pain Referring Physician: Dr. Asencion Noble  Nancy Hicks is an 82 y.o. female.  HPI: 82 year old female 1 day prior to admission started having periscapular shoulder pain associated with range of motion and lifting and lifting away from her body without any history of trauma. She had a prior episode in the past which resolved.  Review of systems she had GI discomfort secondary to admission diagnosis of diverticulitis. Musculoskeletal myalgia. Currently no fever.  Past Medical History:  Diagnosis Date  . Anemia, unspecified   . Anxiety state, unspecified   . Blood dyscrasia    RV factor def  . Cancer ([place])    skin  . Degeneration of cervical intervertebral disc   . Disorder of bone and cartilage, unspecified   . Diverticulosis of colon (without mention of hemorrhage) 01/13/1999  . Esophageal reflux   . Esophageal stricture   . Factor V deficiency ([place])   . Factor V deficiency ([place])   . Hiatal hernia 04/09/2010   ? Paraesophageal component on chest CT 2006  . HTN (hypertension)   . Irritable bowel syndrome   . Neurogenic bladder   . Osteoarthrosis, unspecified whether generalized or localized, unspecified site   . Peripheral vascular disease (HCC)    pe, dvt hx  . Personal history of thrombophlebitis   . Personal history of venous thrombosis and embolism   . 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 Pure hypercholesterolemia   . Raynaud phenomenon   . Shortness of breath dyspnea   . Unspecified asthma(493.90)   . Unspecified disorder resulting from impaired renal function    cysts    Past Surgical History:  Procedure Laterality Date  . CHOLECYSTECTOMY    . COLONOSCOPY  2000   Dr. Verl Blalock: moderate to severe sigmoid diverticulosis  . COLONOSCOPY  03/2015   Dr. Peggye Form at [facility]: TCS for abnormal sigmoid colon on PET. Moderate diverticulosis of the sigmoid colon, sessile 8-9 mm sigmoid: Polyp biopsied (given  anticoagulation), two 3 mm ICV polyps removed. All polyps were tubular adenomas.  . ESOPHAGEAL MANOMETRY  10/26/2011   Procedure: ESOPHAGEAL MANOMETRY (EM);  Surgeon: Sable Feil, MD;  Location: [facility];  Service: Endoscopy;  Laterality: N/A;  . ESOPHAGOGASTRODUODENOSCOPY  03/2010   Dr. Verl Blalock: candida esophagitis, prolapsing hh with cameron lesions, multiple polyps  . EYE SURGERY Bilateral    cataracts  . Flexible sigmoidoscopy  05/2015   Dr. Peggye Form at [facility]: Performed off anticoagulation. 1-1.5 cm sessile sigmoid polyp removed, site tattooed. Pathology revealed tubular adenoma. Colonoscopy in 3-5 years as medically appropriate.  Marland Kitchen PAROTIDECTOMY Left 11/14/2014  . PAROTIDECTOMY Left 11/14/2014   Procedure: LEFT LATERAL PAROTIDECTOMY;  Surgeon: Leta Baptist, MD;  Location: [place];  Service: ENT;  Laterality: Left;  . 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 TONSILLECTOMY      Family History  Problem Relation Age of Onset  . Parkinsonism Mother   . COPD Brother   . Asthma Brother   . Lung cancer Brother   . Heart attack Brother   . Diabetes Paternal Aunt     Social History:  reports that she has never smoked. She has never used smokeless tobacco. She reports that she does not drink alcohol or use drugs.  Allergies:  Allergies  Allergen Reactions  . Metoclopramide Hcl Other (See Comments)    REACTION: causes uncontrolable leg movements  . Nitrofurantoin Nausea And Vomiting and Other (See Comments)  REACTION: dizziness    Medications: I have reviewed the patient's current medications.  Results for orders placed or performed during the hospital encounter of 01/07/16 (from the past 48 hour(s))  Lipase, blood     Status: None   Collection Time: 01/07/16 12:47 PM  Result Value Ref Range   Lipase 33 11 - 51 U/L  Comprehensive metabolic panel     Status: Abnormal   Collection Time: 01/07/16 12:47 PM  Result Value Ref Range   Sodium 137 135 - 145 mmol/L    Potassium 4.2 3.5 - 5.1 mmol/L   Chloride 106 101 - 111 mmol/L   CO2 24 22 - 32 mmol/L   Glucose, Bld 105 (H) 65 - 99 mg/dL   BUN 40 (H) 6 - 20 mg/dL   Creatinine, Ser 1.16 (H) 0.44 - 1.00 mg/dL   Calcium 9.3 8.9 - 10.3 mg/dL   Total Protein 7.0 6.5 - 8.1 g/dL   Albumin 3.8 3.5 - 5.0 g/dL   AST 24 15 - 41 U/L   ALT 14 14 - 54 U/L   Alkaline Phosphatase 93 38 - 126 U/L   Total Bilirubin 0.8 0.3 - 1.2 mg/dL   GFR calc non Af Amer 41 (L) >60 mL/min   GFR calc Af Amer 48 (L) >60 mL/min    Comment: (NOTE) The eGFR has been calculated using the CKD EPI equation. This calculation has not been validated in all clinical situations. eGFR's persistently <60 mL/min signify possible Chronic Kidney Disease.    Anion gap 7 5 - 15  CBC     Status: None   Collection Time: 01/07/16 12:47 PM  Result Value Ref Range   WBC 8.9 4.0 - 10.5 K/uL   RBC 3.87 3.87 - 5.11 MIL/uL   Hemoglobin 12.0 12.0 - 15.0 g/dL   HCT 36.6 36.0 - 46.0 %   MCV 94.6 78.0 - 100.0 fL   MCH 31.0 26.0 - 34.0 pg   MCHC 32.8 30.0 - 36.0 g/dL   RDW 14.9 11.5 - 15.5 %   Platelets 202 150 - 400 K/uL  Protime-INR     Status: Abnormal   Collection Time: 01/07/16  1:06 PM  Result Value Ref Range   Prothrombin Time 26.2 (H) 11.4 - 15.2 seconds   INR 2.36   Urinalysis, Routine w reflex microscopic     Status: None   Collection Time: 01/07/16  5:15 PM  Result Value Ref Range   Color, Urine YELLOW YELLOW   APPearance CLEAR CLEAR   Specific Gravity, Urine 1.010 1.005 - 1.030   pH 6.0 5.0 - 8.0   Glucose, UA NEGATIVE NEGATIVE mg/dL   Hgb urine dipstick NEGATIVE NEGATIVE   Bilirubin Urine NEGATIVE NEGATIVE   Ketones, ur NEGATIVE NEGATIVE mg/dL   Protein, ur NEGATIVE NEGATIVE mg/dL   Nitrite NEGATIVE NEGATIVE   Leukocytes, UA NEGATIVE NEGATIVE    Comment: MICROSCOPIC NOT DONE ON URINES WITH NEGATIVE PROTEIN, BLOOD, LEUKOCYTES, NITRITE, OR GLUCOSE <1000 mg/dL.  Type and screen [facility]     Status: None   Collection  Time: 01/07/16  8:20 PM  Result Value Ref Range   ABO/RH(D) O POS    Antibody Screen NEG    Sample Expiration 01/10/2016   Hemoglobin     Status: Abnormal   Collection Time: 01/07/16 10:41 PM  Result Value Ref Range   Hemoglobin 11.6 (L) 12.0 - 15.0 g/dL  Hematocrit     Status: Abnormal   Collection Time: 01/07/16 10:41 PM  Result Value Ref Range   HCT 35.1 (L) 36.0 - 44.8 %  Basic metabolic panel     Status: Abnormal   Collection Time: 01/08/16  6:55 AM  Result Value Ref Range   Sodium 135 135 - 145 mmol/L   Potassium 3.9 3.5 - 5.1 mmol/L   Chloride 107 101 - 111 mmol/L   CO2 24 22 - 32 mmol/L   Glucose, Bld 130 (H) 65 - 99 mg/dL   BUN 29 (H) 6 - 20 mg/dL   Creatinine, Ser 0.87 0.44 - 1.00 mg/dL   Calcium 8.4 (L) 8.9 - 10.3 mg/dL   GFR calc non Af Amer 58 (L) >60 mL/min   GFR calc Af Amer >60 >60 mL/min    Comment: (NOTE) The eGFR has been calculated using the CKD EPI equation. This calculation has not been validated in all clinical situations. eGFR's persistently <60 mL/min signify possible Chronic Kidney Disease.    Anion gap 4 (L) 5 - 15  Protime-INR     Status: Abnormal   Collection Time: 01/08/16  6:55 AM  Result Value Ref Range   Prothrombin Time 25.7 (H) 11.4 - 15.2 seconds   INR 2.30   Hemoglobin     Status: Abnormal   Collection Time: 01/08/16  6:55 AM  Result Value Ref Range   Hemoglobin 10.8 (L) 12.0 - 15.0 g/dL  Hematocrit     Status: Abnormal   Collection Time: 01/08/16  6:55 AM  Result Value Ref Range   HCT 33.5 (L) 36.0 - 46.0 %  Protime-INR     Status: Abnormal   Collection Time: 01/09/16  6:17 AM  Result Value Ref Range   Prothrombin Time 25.2 (H) 11.4 - 15.2 seconds   INR 2.24   CBC     Status: Abnormal   Collection Time: 01/09/16  6:17 AM  Result Value Ref Range   WBC 9.3 4.0 - 10.5 K/uL   RBC 3.78 (L) 3.87 - 5.11 MIL/uL   Hemoglobin 11.7 (L) 12.0 - 15.0 g/dL   HCT 35.7 (L) 36.0 - 46.0 %   MCV 94.4 78.0 - 100.0 fL   MCH 31.0 26.0 - 34.0  pg   MCHC 32.8 30.0 - 36.0 g/dL   RDW 14.8 11.5 - 15.5 %   Platelets 201 150 - 400 K/uL    Ct Abdomen Pelvis Wo Contrast  Result Date: 01/07/2016 CLINICAL DATA:  Abdominal pain, rectal bleeding, possible diverticulitis EXAM: CT ABDOMEN AND PELVIS WITHOUT CONTRAST TECHNIQUE: Multidetector CT imaging of the abdomen and pelvis was performed following the standard protocol without IV contrast. COMPARISON:  12/21/2014 FINDINGS: Lower chest: Large hiatal hernia is noted. There is atelectasis left lower lobe posterior medially. Hepatobiliary: The study is limited without IV contrast. The patient is status postcholecystectomy. No intrahepatic biliary ductal dilatation. Pancreas: Unenhanced pancreas is unremarkable. Spleen: Unenhanced spleen is unremarkable. Adrenals/Urinary Tract: No adrenal gland mass. Exophytic cystic lesion upper pole of the left kidney is stable. Stable high-density lesion midpole of the left kidney. Stable exophytic lesion in upper pole of the right kidney. Tiny cyst in lower pole of the right kidney stable. No nephrolithiasis. No hydronephrosis or hydroureter. Stomach/Bowel: Oral contrast material was given to the patient. No small bowel obstruction. There is no pericecal inflammation. A low lying cecum is noted with tip in right anterior pelvis. Normal appendix is partially visualized. No pericecal inflammation. There is moderate contrast material and some stool within transverse colon. Moderate stool noted within descending colon. Abundant stool noted  in proximal and mid sigmoid colon. Multiple sigmoid colon diverticula. Mild redundant distal sigmoid colon. In axial image 54 there is thickening of diverticular wall and significant stranding of pericolonic fat in sigmoid colon right pelvis. This is confirmed on coronal image 48. Findings are consistent with acute diverticulitis. There is small adjacent mesocolonic fluid collection measures about 1.7 cm. There is about 4 3 Hounsfield units in  attenuation suspicious for small focal hemorrhage/ hematoma. Moderate gas noted within rectum. Vascular/Lymphatic: Atherosclerotic calcifications of abdominal aorta bilateral renal artery origin and bilateral iliac arteries are noted. No retroperitoneal or mesenteric adenopathy. Reproductive: The uterus is atrophic. No adnexal mass. No inguinal adenopathy. Other: No free abdominal air. There is significant distended urinary bladder. Musculoskeletal: No destructive bony lesions are noted. There is levoscoliosis of the lumbar spine. Extensive degenerative changes are noted thoracolumbar spine. IMPRESSION: 1. In axial image 54 there is thickening of diverticular wall and significant stranding of pericolonic fat in sigmoid colon right pelvis. This is confirmed on coronal image 48. Findings are consistent with acute diverticulitis. There is small adjacent mesocolonic fluid collection measures about 1.7 cm. There is about 4 3 Hounsfield units in attenuation suspicious for small focal hemorrhage/ hematoma. Moderate gas noted within rectum. 2. Low lying cecum.  No pericecal inflammation. 3. Abundant stool noted in proximal sigmoid colon. 4. Stable cystic lesions bilateral kidney. No nephrolithiasis. No hydronephrosis or hydroureter. There is a distended urinary bladder. No bladder filling defects are noted. 5. No small bowel obstruction. 6. Large hiatal hernia is noted. Atelectasis noted left lower lobe posterior medially. 7. Extensive degenerative changes thoracolumbar spine. Levoscoliosis thoracolumbar spine. These results were called by telephone at the time of interpretation on 01/07/2016 at 5:07 pm to Dr. Carmin Muskrat , who verbally acknowledged these results. Electronically Signed   By: Lahoma Crocker M.D.   On: 01/07/2016 17:08   Dg Chest 2 View  Result Date: 01/08/2016 CLINICAL DATA:  82 year old female with history of right-sided shoulder pain. Right anterior chest pain for the past 3 days. Cough. EXAM: CHEST   2 VIEW COMPARISON:  Chest x-ray 05/29/2013. FINDINGS: Chronic atelectasis and/or scarring in the inferior aspect of the left lung related to chronic compression from the patient's massive predominantly left-sided hiatal hernia. Lungs are otherwise clear. No pleural effusions. No evidence of pulmonary edema. Heart size is normal. The patient is rotated to the left on today's exam, resulting in distortion of the mediastinal contours and reduced diagnostic sensitivity and specificity for mediastinal pathology. Atherosclerosis in the thoracic aorta. Surgical clips in the right upper quadrant of the abdomen related to prior cholecystectomy. IMPRESSION: 1. No radiographic evidence of acute cardiopulmonary disease. 2. Massive hiatal hernia redemonstrated. 3. Atherosclerosis. Electronically Signed   By: Vinnie Langton M.D.   On: 01/08/2016 10:46   Dg Shoulder Right  Result Date: 01/08/2016 CLINICAL DATA:  Right shoulder pain. EXAM: RIGHT SHOULDER - 2+ VIEW COMPARISON:  None. FINDINGS: There is no fracture or dislocation. There are mild degenerative changes of the acromioclavicular joint. IMPRESSION: No acute osseous injury of the right shoulder. Electronically Signed   By: Kathreen Devoid   On: 01/08/2016 10:39    ROS Blood pressure (!) 122/58, pulse 80, temperature 98.2 F (36.8 C), temperature source Oral, resp. rate 18, height 5' (1.524 m), weight 132 lb 15 oz (60.3 kg), SpO2 95 %. Physical Exam Vital signs recorded as noted. General appearance she is well-developed and well groomed well-nourished Mood is pleasant affect is normal No gait is observed  patient is in bed but ready to go home  She has normal sensation in the right arm with normal pulse and no lymphadenopathy in the axilla or supraclavicular region  She is tender in the parascapular region. No tenderness over the right shoulder. She has active range of motion of 120 of flexion. She has a negative drop test. Negative impingement test. No  instability in abduction. Very mild weakness in the rotator cuff supraspinatus normal internal and sternal rotation range of motion and strength  Left shoulder full range of motion stability strength and alignment no tenderness pain swelling neurologic deficit or pulse abnormality Assessment/Plan: Plain films show before meals joint arthrosis mild proximal migration of the humerus  No evidence of glenohumeral arthritis  Impression parascapular pain probably secondary to scapulohumeral temporary dissociation secondary to rotator cuff insufficiency.  Recommend active range of motion exercises as I discussed with her and heat 3 times a day.  She can see me in the office as an outpatient if no improvement after 10-14 days.   Arther Abbott 01/09/2016, 12:18 PM

## 2016-01-13 ENCOUNTER — Encounter (HOSPITAL_COMMUNITY): Payer: Medicare Other | Attending: Hematology & Oncology | Admitting: Hematology & Oncology

## 2016-01-13 ENCOUNTER — Encounter (HOSPITAL_COMMUNITY): Payer: Self-pay | Admitting: Hematology & Oncology

## 2016-01-13 DIAGNOSIS — Z8572 Personal history of non-Hodgkin lymphomas: Secondary | ICD-10-CM | POA: Diagnosis not present

## 2016-01-13 DIAGNOSIS — C8589 Other specified types of non-Hodgkin lymphoma, extranodal and solid organ sites: Secondary | ICD-10-CM

## 2016-01-13 NOTE — Assessment & Plan Note (Signed)
We reviewed her CT scan of the abdomen from her hospital admission. No sign of lymphoma noted. Will hold off on CT imaging of the abdomen. Other than her GI problems she is doing well. Will continue with observation.  Have recommended follow-up in 4 to 6 months.

## 2016-01-13 NOTE — Progress Notes (Signed)
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 Benjamine Mola  HISTORY OF PRESENTING ILLNESS:  Nancy Hicks 82 y.o. 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 She presents for ongoing follow-up.  Nancy Hicks returns to the [facility] today unaccompanied.  Since our last visit, she was hospitalized from 01/07/16 to 01/09/16 for diverticulitis. She is currently on an antibiotic to be taken for a total of 8 days - she should be done the middle of the week.   She is taking her blood thinner. She was taken off of it for a little bit for rectal bleeding while in the hospital. She was not aware she was taken off of it.   She does not currently have an appointment scheduled with GI.   She has not been eating her normal amount, stating "This medicine just takes your appetite".   She denies abdominal pain. She is moving her bowels without issue and denies any more rectal bleeding.  She has already received a flu shot this year.  Admits she forgot her cane this morning.  She denies any pain or swelling at her prior surgical resection site. She denies any other sites of adenopathy.  No B symptoms.    MEDICAL HISTORY:  Past Medical History:  Diagnosis Date  . Anemia, unspecified   . Anxiety state, unspecified   . Blood dyscrasia    RV factor def  . Cancer ([place])    skin  . Degeneration of cervical intervertebral disc   . Disorder of bone and cartilage, unspecified   . Diverticulosis of colon (without mention of hemorrhage) 01/13/1999  . Esophageal reflux   . Esophageal stricture   . Factor V deficiency  ([place])   . Factor V deficiency ([place])   . Hiatal hernia 04/09/2010   ? Paraesophageal component on chest CT 2006  . HTN (hypertension)   . Irritable bowel syndrome   . Neurogenic bladder   . Osteoarthrosis, unspecified whether generalized or localized, unspecified site   . Peripheral vascular disease (HCC)    pe, dvt hx  . Personal history of thrombophlebitis   . Personal history of venous thrombosis and embolism   . Primary lymphoma of parotid gland ([place]) 2016  . Pure hypercholesterolemia   . Raynaud phenomenon   . Shortness of breath dyspnea   . Unspecified asthma(493.90)   . Unspecified disorder resulting from impaired renal function    cysts    SURGICAL HISTORY: Past Surgical History:  Procedure Laterality Date  . CHOLECYSTECTOMY    . COLONOSCOPY  2000   Dr. Verl Blalock: moderate to severe sigmoid diverticulosis  . COLONOSCOPY  03/2015   Dr. Peggye Form at [facility]: TCS for abnormal sigmoid colon on PET. Moderate diverticulosis of the sigmoid colon, sessile 8-9 mm sigmoid: Polyp biopsied (given anticoagulation), two 3 mm ICV polyps removed. All polyps were tubular adenomas.  . ESOPHAGEAL MANOMETRY  10/26/2011   Procedure: ESOPHAGEAL MANOMETRY (EM);  Surgeon: Sable Feil, MD;  Location: [facility];  Service: Endoscopy;  Laterality: N/A;  . ESOPHAGOGASTRODUODENOSCOPY  03/2010   Dr. Verl Blalock: candida esophagitis, prolapsing hh with cameron lesions, multiple polyps  .  EYE SURGERY Bilateral    cataracts  . Flexible sigmoidoscopy  05/2015   Dr. Peggye Form at [facility]: Performed off anticoagulation. 1-1.5 cm sessile sigmoid polyp removed, site tattooed. Pathology revealed tubular adenoma. Colonoscopy in 3-5 years as medically appropriate.  Marland Kitchen PAROTIDECTOMY Left 11/14/2014  . PAROTIDECTOMY Left 11/14/2014   Procedure: LEFT LATERAL PAROTIDECTOMY;  Surgeon: Leta Baptist, MD;  Location: [facility];  Service: ENT;  Laterality: Left;  . 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  TONSILLECTOMY      SOCIAL HISTORY: Social History   Social History  . Marital status: Widowed    Spouse name: N/A  . Number of children: 2  . Years of education: N/A   Occupational History  . retired   .  Retired   Social History Main Topics  . Smoking status: Never Smoker  . Smokeless tobacco: Never Used  . Alcohol use No  . Drug use: No  . Sexual activity: Not on file   Other Topics Concern  . Not on file   Social History Narrative  . No narrative on file   She was born in [place], and grew up in [place], [place] Widowed since 84; he had early-onset Alzheimer's in his forties. She has two children; seven grandchildren, and 1 great grandchild and 1 on the way. When asked if she was a smoker, she says "not really." She was never a heavy drinker She used to work as Restaurant manager, fast food  FAMILY HISTORY: Family History  Problem Relation Age of Onset  . Parkinsonism Mother   . COPD Brother   . Asthma Brother   . Lung cancer Brother   . Heart attack Brother   . Diabetes Paternal Aunt    Her father was in his early 5's when he passed; heart problems Mother in late 19's when she passed; she had Parkinson's Disease; no one else had it She has two twin brothers; she says they are healthy, but elderly; 3 years younger than her.  indicated that the status of her mother is unknown. She indicated that the status of her paternal aunt is unknown.    ALLERGIES:  is allergic to metoclopramide hcl and nitrofurantoin.  MEDICATIONS:  Current Outpatient Prescriptions  Medication Sig Dispense Refill  . allopurinol (ZYLOPRIM) 300 MG tablet Take 300 mg by mouth daily.      . Biotin 5000 MCG CAPS Take 1 capsule by mouth daily.    . Calcium Citrate-Vitamin D (CITRACAL MAXIMUM) 315-250 MG-UNIT TABS Take 1 capsule by mouth daily.    . chlorthalidone (HYGROTON) 25 MG tablet Take 12.5 mg by mouth daily.    . Cholecalciferol (VITAMIN D3) 2000 UNITS TABS Take 1 tablet by mouth  daily.      . ciprofloxacin (CIPRO) 500 MG tablet Take 1 tablet (500 mg total) by mouth 2 (two) times daily. 16 tablet 0  . diltiazem (CARDIZEM CD) 240 MG 24 hr capsule take 1 capsule by mouth once daily 90 capsule 3  . esomeprazole (NEXIUM) 40 MG capsule Take 40 mg by mouth daily before breakfast.     . fluticasone (FLONASE) 50 MCG/ACT nasal spray instill 2 sprays into each nostril once daily 16 g 5  . Fluticasone-Salmeterol (ADVAIR DISKUS) 100-50 MCG/DOSE AEPB inhale 1 dose by mouth twice a day 60 each 11  . furosemide (LASIX) 20 MG tablet Take 1 tablet (20 mg total) by mouth daily. 90 tablet 1  . losartan (COZAAR) 100 MG tablet take 1 tablet by  mouth once daily 90 tablet 1  . methenamine (HIPREX) 1 G tablet Take 500 mg by mouth 2 (two) times daily with a meal.     . methylcellulose (CITRUCEL) oral powder Take 1 tsp by mouth daily     . metroNIDAZOLE (FLAGYL) 500 MG tablet Take 1 tablet (500 mg total) by mouth 3 (three) times daily. 24 tablet 0  . Multiple Vitamin (MULITIVITAMIN WITH MINERALS) TABS Take 1 tablet by mouth daily.    Vladimir Faster Glycol-Propyl Glycol (SYSTANE ULTRA PF) 0.4-0.3 % SOLN Place 2 drops into both eyes 3 (three) times daily.     . potassium chloride SA (K-DUR,KLOR-CON) 20 MEQ tablet take 1 tablet by mouth once daily 90 tablet 0  . sertraline (ZOLOFT) 50 MG tablet Take 1 tablet (50 mg total) by mouth daily. 90 tablet 1  . simvastatin (ZOCOR) 10 MG tablet Take 10 mg by mouth at bedtime.      . VENTOLIN HFA 108 (90 BASE) MCG/ACT inhaler inhale 2 puffs by mouth every 6 hours AS NEEDED FOR WHEEZING 18 Inhaler 2  . warfarin (COUMADIN) 2.5 MG tablet Take 2.5 mg by mouth daily. Takes 2.5 mg everyday.     No current facility-administered medications for this visit.     Review of Systems  Musculoskeletal: Positive for joint pain and myalgias.       Hip and knee. Her knee bothers her more than her hip.   14 point ROS was done and is otherwise as detailed above or in  HPI   PHYSICAL EXAMINATION: ECOG PERFORMANCE STATUS: 1 - Symptomatic but completely ambulatory  Vitals:   01/13/16 1204  BP: (!) 132/52  Pulse: 71  Resp: 20  Temp: 98 F (36.7 C)   Filed Weights   01/13/16 1204  Weight: 137 lb (62.1 kg)    Physical Exam  Constitutional: She is oriented to person, place, and time and well-developed, well-nourished, and in no distress.  Able to get on exam table with assitance  HENT:  Head: Normocephalic and atraumatic.  Mouth/Throat: Oropharynx is clear and moist.  Eyes: Conjunctivae and EOM are normal. Pupils are equal, round, and reactive to light.  Neck: Normal range of motion. Neck supple.  Very well healed resection on her left side of neck. Small surgical defect but no swelling, no erythema, incision site barely noticeable.  Cardiovascular: Normal rate, regular rhythm and normal heart sounds.   Pulmonary/Chest: Effort normal and breath sounds normal.  Abdominal: Soft. Bowel sounds are normal. There is tenderness.  Musculoskeletal: Normal range of motion.  Neurological: She is alert and oriented to person, place, and time. Gait normal.  Skin: Skin is warm and dry.  Nursing note and vitals reviewed.   LABORATORY DATA:  I have reviewed the data as listed  Lab Results  Component Value Date   WBC 9.3 01/09/2016   HGB 11.7 (L) 01/09/2016   HCT 35.7 (L) 01/09/2016   MCV 94.4 01/09/2016   PLT 201 01/09/2016   CBC    Component Value Date/Time   WBC 9.3 01/09/2016 0617   RBC 3.78 (L) 01/09/2016 0617   HGB 11.7 (L) 01/09/2016 0617   HCT 35.7 (L) 01/09/2016 0617   PLT 201 01/09/2016 0617   MCV 94.4 01/09/2016 0617   MCH 31.0 01/09/2016 0617   MCHC 32.8 01/09/2016 0617   RDW 14.8 01/09/2016 0617   LYMPHSABS 1.4 09/09/2015 1230   MONOABS 0.4 09/09/2015 1230   EOSABS 0.0 09/09/2015 1230   BASOSABS 0.0 09/09/2015 1230  CMP     Component Value Date/Time   NA 135 01/08/2016 0655   K 3.9 01/08/2016 0655   CL 107 01/08/2016  0655   CO2 24 01/08/2016 0655   GLUCOSE 130 (H) 01/08/2016 0655   BUN 29 (H) 01/08/2016 0655   CREATININE 0.87 01/08/2016 0655   CALCIUM 8.4 (L) 01/08/2016 0655   PROT 7.0 01/07/2016 1247   ALBUMIN 3.8 01/07/2016 1247   AST 24 01/07/2016 1247   ALT 14 01/07/2016 1247   ALKPHOS 93 01/07/2016 1247   BILITOT 0.8 01/07/2016 1247   GFRNONAA 58 (L) 01/08/2016 0655   GFRAA >60 01/08/2016 9381   RADIOGRAPHIC STUDIES: I have personally reviewed the radiological images as listed and agreed with the findings in the report. Study Result   CLINICAL DATA:  Abdominal pain, rectal bleeding, possible diverticulitis  EXAM: CT ABDOMEN AND PELVIS WITHOUT CONTRAST  TECHNIQUE: Multidetector CT imaging of the abdomen and pelvis was performed following the standard protocol without IV contrast.  COMPARISON:  12/21/2014  FINDINGS: Lower chest: Large hiatal hernia is noted. There is atelectasis left lower lobe posterior medially.  Hepatobiliary: The study is limited without IV contrast. The patient is status postcholecystectomy. No intrahepatic biliary ductal dilatation.  Pancreas: Unenhanced pancreas is unremarkable.  Spleen: Unenhanced spleen is unremarkable.  Adrenals/Urinary Tract: No adrenal gland mass. Exophytic cystic lesion upper pole of the left kidney is stable. Stable high-density lesion midpole of the left kidney. Stable exophytic lesion in upper pole of the right kidney. Tiny cyst in lower pole of the right kidney stable. No nephrolithiasis. No hydronephrosis or hydroureter.  Stomach/Bowel: Oral contrast material was given to the patient. No small bowel obstruction. There is no pericecal inflammation. A low lying cecum is noted with tip in right anterior pelvis. Normal appendix is partially visualized. No pericecal inflammation.  There is moderate contrast material and some stool within transverse colon. Moderate stool noted within descending colon. Abundant  stool noted in proximal and mid sigmoid colon. Multiple sigmoid colon diverticula. Mild redundant distal sigmoid colon. In axial image 54 there is thickening of diverticular wall and significant stranding of pericolonic fat in sigmoid colon right pelvis. This is confirmed on coronal image 48. Findings are consistent with acute diverticulitis. There is small adjacent mesocolonic fluid collection measures about 1.7 cm. There is about 4 3 Hounsfield units in attenuation suspicious for small focal hemorrhage/ hematoma. Moderate gas noted within rectum.  Vascular/Lymphatic: Atherosclerotic calcifications of abdominal aorta bilateral renal artery origin and bilateral iliac arteries are noted. No retroperitoneal or mesenteric adenopathy.  Reproductive: The uterus is atrophic. No adnexal mass. No inguinal adenopathy.  Other: No free abdominal air. There is significant distended urinary bladder.  Musculoskeletal: No destructive bony lesions are noted. There is levoscoliosis of the lumbar spine. Extensive degenerative changes are noted thoracolumbar spine.  IMPRESSION: 1. In axial image 54 there is thickening of diverticular wall and significant stranding of pericolonic fat in sigmoid colon right pelvis. This is confirmed on coronal image 48. Findings are consistent with acute diverticulitis. There is small adjacent mesocolonic fluid collection measures about 1.7 cm. There is about 4 3 Hounsfield units in attenuation suspicious for small focal hemorrhage/ hematoma. Moderate gas noted within rectum. 2. Low lying cecum.  No pericecal inflammation. 3. Abundant stool noted in proximal sigmoid colon. 4. Stable cystic lesions bilateral kidney. No nephrolithiasis. No hydronephrosis or hydroureter. There is a distended urinary bladder. No bladder filling defects are noted. 5. No small bowel obstruction. 6. Large hiatal hernia  is noted. Atelectasis noted left lower lobe posterior  medially. 7. Extensive degenerative changes thoracolumbar spine. Levoscoliosis thoracolumbar spine.  These results were called by telephone at the time of interpretation on 01/07/2016 at 5:07 pm to Dr. Carmin Muskrat , who verbally acknowledged these results.   Electronically Signed   By: Lahoma Crocker M.D.   On: 01/07/2016 17:08  PATHOLOGY: ADDITIONAL INFORMATION: Immunoglobulin heavy chain gene rearrangement was performed on a representative block at the [facility] in [place], [place]. The analysis is positive for a clonal immunoglobulin heavy locus (IgH) gene rearrangement as detected by DNA amplification using consensus primers to the Heavy locus variable (framework II and framework III) and joining regions. In addition, FISH analysis for BCL-2 and BCL-6 performed on the same paraffin embedded tissue are negative. The overall findings are consistent with involvement by a low grade non Hodgkin's B cell lymphoma, favor marginal zone type. Clinical correlation is recommended. (BNS:gt, 12/10/14) Susanne Greenhouse MD Pathologist, Electronic Signature ( Signed 12/10/2014) FINAL DIAGNOSIS Diagnosis Parotid gland, left mass - ATYPICAL LYMPHOID PROLIFERATION. - SEE COMMENT. Microscopic Comment The sections show a dense and relatively monomorphic lymphoid proliferation mostly composed of small round to slightly irregular lymphocytes with high nuclear cytoplasmic ratio, dense chromatin and small to inconspicuous nuclei intermixed with numerous epithelial, mostly ductal elements displaying prominent lymphoepithelial lesions. The latter are often surrounded by paler zones of slightly larger lymphocytes with moderately abundant cytoplasm and small nucleoli imparting a monocytoid appearance. In this background, scattered reactive appearing germinal centers are present. A sample for flow cytometric analysis is not available. Hence immunohistochemical stains were  performed and show that the lymphoid cells are predominantly composed of B cells including lymphoepithelial lesions as highlighted by CD20 and CD79a admixed with T cells to a lesser extent as highlighted with CD3, CD43, and CD5. B-cell areas do not show any significant staining for BCL-6, CD10, CD5 or cyclin D1. CD10 and BCL-6 highlight scattered previously described germinal centers which are BCL2 negative. CD21 shows numerous variably sized dendritic networks, representing residual and/or effaced germinal centers. CD138 highlights a minor plasma cell component that appears to show a polyclonal staining pattern for kappa and lambda light chains. Given the overall morphologic and 1 of 2 FINAL for FELICITY, PENIX ([id number]) Microscopic Comment(continued) immunophenotypic features, the findings are worrisome for involvement by low grade marginal zone lymphoma (MALT). Since the differential diagnosis includes benign lymphoepithelial lesion, gene rearrangement study for immunoglobulin heavy chain gene and FISH will be performed to further evaluate this lesion and the results reported in an addendum. 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 Benjamine Mola PET/CT on 12/21/2014 with interval resection of malignant lymph node in the anterior left parotid gland, no hypermetabolic adenopathy in the neck chest abdomen or pelvis Marcello Moores single focus of intense uptake within the sigmoid colon, nonspecific  She has no evidence of recurrence on exam.  She is overall doing quite well. We will continue with observation. She will return in 6 months with ongoing observation.  CT of the abdomen during her inpatient stay was  reviewed, results are noted above. No evidence of lymphoma.   Ms. Peeters underwent a colonoscopy at [facility] that revealed several benign polyps, including one very large  benign polyp.  She has completed removal with her GI physician at [facility].  Follow-up of her marginal zone lymphoma in accordance with NCCN guidelines:  1. Clinical follow-up every 3 to 6 months for 5 years and then yearly  2. Follow-up includes diagnostic tests and imaging previously used as clinically indicated 3. Observation may be considered for patients whose diagnostic biopsy was excisional or where RT could result in significant morbidity 4. Surgical excision for adequate diagnosis may be appropriate treatment for disease  Last mammogram performed on 01/17/2015.  01/09/2016 Labs reviewed.  She has already received a flu shot this year.  She will return for follow up in 4 to 6 months. She will contact our office if she develops any lumps, bumps, or any other concerns.  Orders Placed This Encounter  Procedures  . CBC with Differential    Standing Status:   Future    Standing Expiration Date:   01/12/2017  . Comprehensive metabolic panel    Standing Status:   Future    Standing Expiration Date:   01/12/2017  . Lactate dehydrogenase    Standing Status:   Future    Standing Expiration Date:   01/12/2017   All questions were answered. The patient knows to call the clinic with any problems, questions or concerns.   This document serves as a record of services personally performed by Ancil Linsey, MD. It was created on her behalf by Arlyce Harman, a trained medical scribe. The creation of this record is based on the scribe's personal observations and the provider's statements to them. This document has been checked and approved by the attending provider.  I have reviewed the above documentation for accuracy and completeness, and I agree with the above.  This note was electronically signed.    Kelby Fam.  Whitney Muse, MD

## 2016-01-13 NOTE — Patient Instructions (Signed)
[facility] at [facility] Discharge Instructions  RECOMMENDATIONS MADE BY THE CONSULTANT AND ANY TEST RESULTS WILL BE SENT TO YOUR REFERRING PHYSICIAN.  Follow up in 6 months with labs. Don't forget your cane.  Thank you for choosing [facility] at [facility] to provide your oncology and hematology care.  To afford each patient quality time with our provider, please arrive at least 15 minutes before your scheduled appointment time.   Beginning January 23rd 2017 lab work for the [facility] will be done in the  Main lab at [facility] on 1st floor. If you have a lab appointment with the [facility] please come in thru the  Main Entrance and check in at the main information desk  You need to re-schedule your appointment should you arrive 10 or more minutes late.  We strive to give you quality time with our providers, and arriving late affects you and other patients whose appointments are after yours.  Also, if you no show three or more times for appointments you may be dismissed from the clinic at the providers discretion.     Again, thank you for choosing [facility].  Our hope is that these requests will decrease the amount of time that you wait before being seen by our physicians.       _____________________________________________________________  Should you have questions after your visit to [facility], please contact our office at [phone] between the hours of 8:30 a.m. and 4:30 p.m.  Voicemails left after 4:30 p.m. will not be returned until the following business day.  For prescription refill requests, have your pharmacy contact our office.         Resources For Cancer Patients and their Caregivers ? American Cancer Society: Can assist with transportation, wigs, general needs, runs Look Good Feel Better.        352-327-2517 ? Cancer Care: Provides financial assistance, online support groups,  medication/co-pay assistance.  1-800-813-HOPE 330-884-8008) ? [facility] Assists [place] cancer patients and their families through emotional , educational and financial support.  434-586-2501 ? [place] DSS Where to apply for food stamps, Medicaid and utility assistance. 760 394 0274 ? [facility]: Transportation to medical appointments. 909 744 3920 ? Social Security Administration: May apply for disability if have a Stage IV cancer. 5015500772 315 599 7766 ? [employer], Disability and Transit Services: Assists with nutrition, care and transit needs. [facility] Support Programs: @10RELATIVEDAYS @ > Cancer Support Group  2nd Tuesday of the month 1pm-2pm, Journey Room  > Creative Journey  3rd Tuesday of the month 1130am-1pm, Journey Room  > Look Good Feel Better  1st Wednesday of the month 10am-12 noon, Journey Room (Call [facility] to register [phone])

## 2016-02-02 ENCOUNTER — Encounter (HOSPITAL_COMMUNITY): Payer: Self-pay | Admitting: Hematology & Oncology

## 2016-02-28 ENCOUNTER — Other Ambulatory Visit: Payer: Self-pay | Admitting: Pulmonary Disease

## 2016-03-03 ENCOUNTER — Ambulatory Visit (HOSPITAL_COMMUNITY): Payer: Medicare Other | Attending: Internal Medicine | Admitting: Physical Therapy

## 2016-03-03 DIAGNOSIS — R293 Abnormal posture: Secondary | ICD-10-CM | POA: Diagnosis present

## 2016-03-03 DIAGNOSIS — M542 Cervicalgia: Secondary | ICD-10-CM | POA: Diagnosis not present

## 2016-03-03 NOTE — Therapy (Signed)
[place], [place], [address] Phone: [phone]   Fax:  [phone]  Physical Therapy Evaluation  Patient Details  Name: Nancy Hicks MRN: [id number] Date of Birth: 1926-06-08 Referring Provider: Asencion Noble  Encounter Date: 03/03/2016      PT End of Session - 03/03/16 1205    Visit Number 1   Number of Visits 6   Date for PT Re-Evaluation 04/02/16   Authorization Type U[employer] medicare   Authorization - Visit Number 1   Authorization - Number of Visits 6   PT Start Time [id number]   PT Stop Time 1158   PT Time Calculation (min) 43 min   Activity Tolerance Patient tolerated treatment well      Past Medical History:  Diagnosis Date  . Anemia, unspecified   . Anxiety state, unspecified   . Blood dyscrasia    RV factor def  . Cancer ([place])    skin  . Degeneration of cervical intervertebral disc   . Disorder of bone and cartilage, unspecified   . Diverticulosis of colon (without mention of hemorrhage) 01/13/1999  . Esophageal reflux   . Esophageal stricture   . Factor V deficiency ([place])   . Factor V deficiency ([place])   . Hiatal hernia 04/09/2010   ? Paraesophageal component on chest CT 2006  . HTN (hypertension)   . Irritable bowel syndrome   . Neurogenic bladder   . Osteoarthrosis, unspecified whether generalized or localized, unspecified site   . Peripheral vascular disease (HCC)    pe, dvt hx  . Personal history of thrombophlebitis   . Personal history of venous thrombosis and embolism   . 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 Pure hypercholesterolemia   . Raynaud phenomenon   . Shortness of breath dyspnea   . Unspecified asthma(493.90)   . Unspecified disorder resulting from impaired renal function    cysts    Past Surgical History:  Procedure Laterality Date  . CHOLECYSTECTOMY    . COLONOSCOPY  2000   Dr. Verl Blalock: moderate to severe sigmoid diverticulosis  . COLONOSCOPY   03/2015   Dr. Peggye Form at [facility]: TCS for abnormal sigmoid colon on PET. Moderate diverticulosis of the sigmoid colon, sessile 8-9 mm sigmoid: Polyp biopsied (given anticoagulation), two 3 mm ICV polyps removed. All polyps were tubular adenomas.  . ESOPHAGEAL MANOMETRY  10/26/2011   Procedure: ESOPHAGEAL MANOMETRY (EM);  Surgeon: Sable Feil, MD;  [facility];  Service: Endoscopy;  Laterality: N/A;  . ESOPHAGOGASTRODUODENOSCOPY  03/2010   Dr. Verl Blalock: candida esophagitis, prolapsing hh with cameron lesions, multiple polyps  . EYE SURGERY Bilateral    cataracts  . Flexible sigmoidoscopy  05/2015   Dr. Peggye Form at [facility]: Performed off anticoagulation. 1-1.5 cm sessile sigmoid polyp removed, site tattooed. Pathology revealed tubular adenoma. Colonoscopy in 3-5 years as medically appropriate.  Marland Kitchen PAROTIDECTOMY Left 11/14/2014  . PAROTIDECTOMY Left 11/14/2014   Procedure: LEFT LATERAL PAROTIDECTOMY;  Surgeon: Leta Baptist, MD;  Location: [facility];  Service: ENT;  Laterality: Left;  . 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 TONSILLECTOMY      There were no vitals filed for this visit.       Subjective Assessment - 03/03/16 1116    Subjective Nancy Hicks states that her neck just started hurting,there was no injury or trauma.  Her MD gave her predinsone and mm relaxors and she is much better at this  time.  She states that she is  only having pain at end range when turning to both to the right and to the left.  She is also having a headache just about everyday.     Pertinent History PE, OA   Currently in Pain? Yes   Pain Location Neck   Pain Orientation Right   Pain Descriptors / Indicators Pressure;Aching   Pain Type Chronic pain   Pain Radiating Towards was going into both shoulders but feels better now that she has had the medication; medication has been stopped for about the past three weeks.     Pain Onset More than a month ago   Pain Frequency Intermittent    Aggravating Factors  when she was acute pain would occure with any motion now that it has calmed down pain is with end range.    Pain Relieving Factors medication    Effect of Pain on Daily Activities increases pain             [facility] PT Assessment - 03/03/16 0001      Assessment   Medical Diagnosis cervical pain    Referring Provider Asencion Noble   Onset Date/Surgical Date 01/21/16   Next MD Visit 04/25/2016   Prior Therapy none      Precautions   Precautions None     Restrictions   Weight Bearing Restrictions No     Balance Screen   Has the patient fallen in the past 6 months No   Has the patient had a decrease in activity level because of a fear of falling?  No   Is the patient reluctant to leave their home because of a fear of falling?  No     Home Ecologist residence     Prior Function   Level of Independence Independent     Cognition   Overall Cognitive Status Within Functional Limits for tasks assessed     Observation/Other Assessments   Focus on Therapeutic Outcomes (FOTO)  45     Posture/Postural Control   Posture/Postural Control Postural limitations   Postural Limitations Rounded Shoulders;Forward head;Increased thoracic kyphosis     ROM / Strength   AROM / PROM / Strength AROM;Strength     AROM   AROM Assessment Site Cervical   Cervical Flexion WNL repetition no Change    Cervical Extension WNL repetition    Cervical - Right Side Bend 35   Cervical - Left Side Bend 40   Cervical - Right Rotation 45 repetition improves  ROM no change in pain    Cervical - Left Rotation 50 repetition no change      Strength   Strength Assessment Site Cervical   Cervical Extension 3/5   Cervical - Right Side Bend 4-/5   Cervical - Left Side Bend 4+/5     Palpation   Palpation comment mm spasms noted in bilateral trapezius mm.                    [facility] Adult PT Treatment/Exercise - 03/03/16 0001      Exercises    Exercises Neck     Neck Exercises: Seated   Cervical Isometrics Extension;Right lateral flexion;Left lateral flexion;3 secs;5 reps   Neck Retraction 10 reps   Postural Training done    Other Seated Exercise scapular retractionx 10                PT Education - 03/03/16 1204  Education provided Yes   Education Details The importance of posture and HEP    Person(s) Educated Patient   Methods Explanation;Handout   Comprehension Verbalized understanding;Returned demonstration          PT Short Term Goals - 03/03/16 1219      PT SHORT TERM GOAL #1   Title Pt to have full cervical rotation without increased pain at end range.    Time 10   Period Days   Status New     PT SHORT TERM GOAL #2   Title Pt pain to be no greater than a 2/10 in occipital area and pain to be occuring only 4x a week    Time 10   Period Days   Status New           PT Long Term Goals - 03/03/16 1220      PT LONG TERM GOAL #1   Title Pt to have had no headaches for the last week    Time 3   Period Weeks   Status New     PT LONG TERM GOAL #2   Title Pt cervical strength to be at least a 4+/5 to allow pt to keep head in proper alignment to decrease stress on cervical joints    Time 3   Period Weeks   Status New     PT LONG TERM GOAL #3   Title PT to verbalize  the importance of good posture and be attempting to maintain good posture to decrease stress on cervical joints.    Time 3   Period Weeks   Status New               Plan - 03/03/16 1211    Clinical Impression Statement Nancy Hicks is an 83 yo female who had an insidious onset of cervical pain in late October.  She went to the MD who gave her mm relaxers and steroids; both of which has helped her pain at least 80%.   She is still having pain at the end range of rotation and having daily headaches along the right occipital region.  She is currently being referred to skilled physical therapy to decrease her pain and  improve her mobility.  Examination demonstrates postrual dysfunction, decreased cervical strength, increased pain at end range of motion and reports of daily cervical headaches.  She will benefit from skilled physical therapy to address these issues.     Rehab Potential Good   PT Frequency 2x / week   PT Duration 3 weeks   PT Treatment/Interventions ADLs/Self Care Home Management;Therapeutic activities;Patient/family education;Therapeutic exercise;Manual techniques;Passive range of motion   PT Next Visit Plan begin gentle manual traction, instruct in W-back while keeping head stabilized, cervical stretch, and backward shoulder rolls.     PT Home Exercise Plan isometric, cervical and scapular retraction.    Consulted and Agree with Plan of Care Patient      Patient will benefit from skilled therapeutic intervention in order to improve the following deficits and impairments:  Decreased range of motion, Decreased strength, Increased muscle spasms, Postural dysfunction, Pain  Visit Diagnosis: Cervicalgia - Plan: PT plan of care cert/re-cert  Abnormal posture - Plan: PT plan of care cert/re-cert      G-Codes - [id number]    Functional Assessment Tool Used foto    Functional Limitation Changing and maintaining body position   Changing and Maintaining Body Position Current Status [id number]) At least 40 percent but less than 60 percent  impaired, limited or restricted   Changing and Maintaining Body Position Goal Status [phone]) At least 40 percent but less than 60 percent impaired, limited or restricted       Problem List Patient Active Problem List   Diagnosis Date Noted  . Acute pain of right shoulder   . Acute diverticulitis 01/07/2016  . Bright red rectal bleeding 01/07/2016  . Factor V deficiency ([place]) 01/07/2016  . CKD (chronic kidney disease), stage II 01/07/2016  . Diverticulitis 01/07/2016  . Sigmoid diverticulitis 01/07/2016  . Diverticulitis of large intestine without  perforation or abscess with bleeding   . Marginal zone lymphoma of extranodal and solid organ sites [facility]) 09/09/2015  . H/O superficial parotidectomy 11/14/2014  . Diverticulitis of colon (without mention of hemorrhage)(562.11) 06/09/2011  . GERD (gastroesophageal reflux disease) 06/09/2011  . Right rib fracture 06/09/2011  . Anticoagulant long-term use 06/09/2011  . COPD with chronic bronchitis ([place]) 06/09/2011  . HH (hiatus hernia) 11/04/2010  . OTHER DYSPHAGIA 03/11/2010  . RENAL INSUFFICIENCY 04/02/2008  . IBS 01/24/2008  . HYPERCHOLESTEROLEMIA 03/14/2007  . FACTOR V DEFICIENCY 03/14/2007  . ASTHMA 03/14/2007  . NEUROGENIC BLADDER 03/14/2007  . [place] DISEASE, CERVICAL 03/14/2007  . PULMONARY EMBOLISM, HX OF 03/14/2007  . DEEP VENOUS THROMBOPHLEBITIS, HX OF 03/14/2007  . ANEMIA 01/10/2007  . ANXIETY 01/10/2007  . Hypertension 01/10/2007  . DIVERTICULOSIS, COLON 01/10/2007  . DEGENERATIVE JOINT DISEASE 01/10/2007  . OSTEOPENIA 01/10/2007    Rayetta Humphrey, PT CLT [phone] 03/03/2016, 12:28 PM  [place] [address] [place], [place], [address] Phone: [phone]   Fax:  [phone]  Name: Nancy Hicks MRN: [id number] Date of Birth: 08-11-1926

## 2016-03-03 NOTE — Patient Instructions (Addendum)
Flexibility: Neck Retraction    Pull head straight back, keeping eyes and jaw level. Repeat __10__ times per set. Do _1___ sets per session. Do __2__ sessions per day.  http://orth.exer.us/344   Copyright  VHI. All rights reserved.  Scapular Retraction (Standing)    With arms at sides, pinch shoulder blades together. Repeat _10___ times per set. Do __1__ sets per session. Do __2__ sessions per day.  http://orth.exer.us/944   Copyright  VHI. All rights reserved.  Strengthening: Extension - Isometric (in Neutral)    Using light pressure from fingertips at back of head, resist bending head backward. Hold __10__ seconds. Repeat _1___ times per set. Do __1__ sets per session. Do __2__ sessions per day.  http://orth.exer.us/308   Copyright  VHI. All rights reserved.  Strengthening: Lateral Bend - Isometric (in Neutral)    Using light pressure from hand, press into right temple. Resist bending head sideways. Hold ___3-5_ seconds. Repeat 10____ times per set. Do __1__ sets per session. Do 2 ____ sessions per day.  http://orth.exer.us/302   Copyright  VHI. All rights reserved.

## 2016-03-10 ENCOUNTER — Ambulatory Visit (HOSPITAL_COMMUNITY): Payer: Medicare Other | Admitting: Physical Therapy

## 2016-03-10 ENCOUNTER — Telehealth (HOSPITAL_COMMUNITY): Payer: Self-pay | Admitting: Physical Therapy

## 2016-03-10 DIAGNOSIS — R293 Abnormal posture: Secondary | ICD-10-CM

## 2016-03-10 DIAGNOSIS — M542 Cervicalgia: Secondary | ICD-10-CM

## 2016-03-10 NOTE — Telephone Encounter (Signed)
She is stressed from the holidays and wants to reschedule for after Christmas

## 2016-03-10 NOTE — Therapy (Signed)
[place], [place], [address] Phone: [phone]   Fax:  [phone]  Physical Therapy Treatment  Patient Details  Name: Nancy Hicks MRN: [id number] Date of Birth: Aug 16, 1926 Referring Provider: Asencion Noble  Encounter Date: 03/10/2016      PT End of Session - 03/10/16 1010    Visit Number 2   Number of Visits 6   Date for PT Re-Evaluation 04/02/16   Authorization Type UHC medicare   Authorization - Visit Number 2   Authorization - Number of Visits 6   PT Start Time [phone]   PT Stop Time 1035   PT Time Calculation (min) 41 min   Activity Tolerance Patient tolerated treatment well      Past Medical History:  Diagnosis Date  . Anemia, unspecified   . Anxiety state, unspecified   . Blood dyscrasia    RV factor def  . Cancer ([place])    skin  . Degeneration of cervical intervertebral disc   . Disorder of bone and cartilage, unspecified   . Diverticulosis of colon (without mention of hemorrhage) 01/13/1999  . Esophageal reflux   . Esophageal stricture   . Factor V deficiency ([place])   . Factor V deficiency ([place])   . Hiatal hernia 04/09/2010   ? Paraesophageal component on chest CT 2006  . HTN (hypertension)   . Irritable bowel syndrome   . Neurogenic bladder   . Osteoarthrosis, unspecified whether generalized or localized, unspecified site   . Peripheral vascular disease (HCC)    pe, dvt hx  . Personal history of thrombophlebitis   . Personal history of venous thrombosis and embolism   . 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 Pure hypercholesterolemia   . Raynaud phenomenon   . Shortness of breath dyspnea   . Unspecified asthma(493.90)   . Unspecified disorder resulting from impaired renal function    cysts    Past Surgical History:  Procedure Laterality Date  . CHOLECYSTECTOMY    . COLONOSCOPY  2000   Dr. Verl Blalock: moderate to severe sigmoid diverticulosis  . COLONOSCOPY   03/2015   Dr. Peggye Form at [facility]: TCS for abnormal sigmoid colon on PET. Moderate diverticulosis of the sigmoid colon, sessile 8-9 mm sigmoid: Polyp biopsied (given anticoagulation), two 3 mm ICV polyps removed. All polyps were tubular adenomas.  . ESOPHAGEAL MANOMETRY  10/26/2011   Procedure: ESOPHAGEAL MANOMETRY (EM);  Surgeon: Sable Feil, MD;  [facility];  Service: Endoscopy;  Laterality: N/A;  . ESOPHAGOGASTRODUODENOSCOPY  03/2010   Dr. Verl Blalock: candida esophagitis, prolapsing hh with cameron lesions, multiple polyps  . EYE SURGERY Bilateral    cataracts  . Flexible sigmoidoscopy  05/2015   [facility]e [facility]: Performed off anticoagulation. 1-1.5 cm sessile sigmoid polyp removed, site tattooed. Pathology revealed tubular adenoma. Colonoscopy in 3-5 years as medically appropriate.  Marland Kitchen PAROTIDECTOMY Left 11/14/2014  . PAROTIDECTOMY Left 11/14/2014   Procedure: LEFT LATERAL PAROTIDECTOMY;  Surgeon: Leta Baptist, MD;  Location: [facility];  Service: ENT;  Laterality: Left;  . 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 TONSILLECTOMY      There were no vitals filed for this visit.      Subjective Assessment - 03/10/16 0958    Subjective Pt states that she has been doing her exercises.  She has been doing well until yesterday afternoon.  She feels this is more stress related and would like to cancel  her appointments and start up at the beginning of the year.     Pertinent History PE, OA   Currently in Pain? Yes   Pain Score 4    Pain Location Head   Pain Descriptors / Indicators Aching   Pain Onset More than a month ago                         [facility] Adult PT Treatment/Exercise - 03/10/16 0001      Posture/Postural Control   Posture/Postural Control Postural limitations   Postural Limitations Rounded Shoulders;Forward head;Increased thoracic kyphosis     Exercises   Exercises Neck     Neck Exercises: Seated   Cervical Isometrics Extension;Right  lateral flexion;Left lateral flexion;3 secs;5 reps   Neck Retraction 10 reps   W Back 10 reps   Shoulder Shrugs 5 reps   Shoulder Shrugs Limitations up, back and relax    Postural Training done    Other Seated Exercise scapular retractionx 10     Neck Exercises: Supine   Cervical Isometrics Extension;Right lateral flexion;Left lateral flexion;3 secs;5 reps     Manual Therapy   Manual Therapy Soft tissue mobilization;Manual Traction   Soft tissue mobilization to decrease tension.     Manual Traction with suboccipital release      Neck Exercises: Stretches   Neck Stretch 5 reps;10 seconds                  PT Short Term Goals - 03/10/16 1210      PT SHORT TERM GOAL #1   Title Pt to have full cervical rotation without increased pain at end range.    Time 10   Period Days   Status On-going     PT SHORT TERM GOAL #2   Title Pt pain to be no greater than a 2/10 in occipital area and pain to be occuring only 4x a week    Time 10   Period Days   Status On-going           PT Long Term Goals - 03/10/16 1210      PT LONG TERM GOAL #1   Title Pt to have had no headaches for the last week    Time 3   Period Weeks   Status On-going     PT LONG TERM GOAL #2   Title Pt cervical strength to be at least a 4+/5 to allow pt to keep head in proper alignment to decrease stress on cervical joints    Time 3   Period Weeks   Status On-going     PT LONG TERM GOAL #3   Title PT to verbalize  the importance of good posture and be attempting to maintain good posture to decrease stress on cervical joints.    Time 3   Period Weeks   Status On-going               Plan - 03/10/16 1208    Clinical Impression Statement Pt headache decreased with treatment.  Pt more aware of postural deficits.  Pt requests to hold visits until after Christmas due to company coming in for the holidays.     Rehab Potential Good   PT Frequency 2x / week   PT Duration 3 weeks   PT  Treatment/Interventions ADLs/Self Care Home Management;Therapeutic activities;Patient/family education;Therapeutic exercise;Manual techniques;Passive range of motion   PT Next Visit Plan begin chest stretch and postrual t-band exercises while keeping  cervical spine stabilized    PT Home Exercise Plan isometric, cervical and scapular retraction.    Consulted and Agree with Plan of Care Patient      Patient will benefit from skilled therapeutic intervention in order to improve the following deficits and impairments:  Decreased range of motion, Decreased strength, Increased muscle spasms, Postural dysfunction, Pain  Visit Diagnosis: Cervicalgia  Abnormal posture     Problem List Patient Active Problem List   Diagnosis Date Noted  . Acute pain of right shoulder   . Acute diverticulitis 01/07/2016  . Bright red rectal bleeding 01/07/2016  . Factor V deficiency ([place]) 01/07/2016  . CKD (chronic kidney disease), stage II 01/07/2016  . Diverticulitis 01/07/2016  . Sigmoid diverticulitis 01/07/2016  . Diverticulitis of large intestine without perforation or abscess with bleeding   . Marginal zone lymphoma of extranodal and solid organ sites [facility]) 09/09/2015  . H/O superficial parotidectomy 11/14/2014  . Diverticulitis of colon (without mention of hemorrhage)(562.11) 06/09/2011  . GERD (gastroesophageal reflux disease) 06/09/2011  . Right rib fracture 06/09/2011  . Anticoagulant long-term use 06/09/2011  . COPD with chronic bronchitis ([place]) 06/09/2011  . HH (hiatus hernia) 11/04/2010  . OTHER DYSPHAGIA 03/11/2010  . RENAL INSUFFICIENCY 04/02/2008  . IBS 01/24/2008  . HYPERCHOLESTEROLEMIA 03/14/2007  . FACTOR V DEFICIENCY 03/14/2007  . ASTHMA 03/14/2007  . NEUROGENIC BLADDER 03/14/2007  . [place] DISEASE, CERVICAL 03/14/2007  . PULMONARY EMBOLISM, HX OF 03/14/2007  . DEEP VENOUS THROMBOPHLEBITIS, HX OF 03/14/2007  . ANEMIA 01/10/2007  . ANXIETY 01/10/2007  . Hypertension 01/10/2007   . DIVERTICULOSIS, COLON 01/10/2007  . DEGENERATIVE JOINT DISEASE 01/10/2007  . OSTEOPENIA 01/10/2007    Rayetta Humphrey, PT CLT [phone] 03/10/2016, 12:10 PM  [place] [address] [place], [place], [address] Phone: [phone]   Fax:  [phone]  Name: GLENDALE METZLER MRN: [id number] Date of Birth: [birth year]

## 2016-03-12 ENCOUNTER — Ambulatory Visit (HOSPITAL_COMMUNITY): Payer: Medicare Other

## 2016-03-17 ENCOUNTER — Ambulatory Visit (HOSPITAL_COMMUNITY): Payer: Medicare Other | Admitting: Physical Therapy

## 2016-03-18 ENCOUNTER — Ambulatory Visit (HOSPITAL_COMMUNITY): Payer: Medicare Other

## 2016-03-25 ENCOUNTER — Telehealth (HOSPITAL_COMMUNITY): Payer: Self-pay | Admitting: Physical Therapy

## 2016-03-25 ENCOUNTER — Ambulatory Visit (HOSPITAL_COMMUNITY): Payer: Medicare Other | Admitting: Physical Therapy

## 2016-03-25 DIAGNOSIS — M542 Cervicalgia: Secondary | ICD-10-CM

## 2016-03-25 DIAGNOSIS — R293 Abnormal posture: Secondary | ICD-10-CM

## 2016-03-25 NOTE — Therapy (Signed)
[facility] [place], [place], [address] Phone: [phone]   Fax:  [phone]  Physical Therapy Treatment  Patient Details  Name: Nancy Hicks MRN: [id number] Date of Birth: [birth year] Referring Provider: Asencion Noble  Encounter Date: 03/25/2016      PT End of Session - 03/25/16 1603    Visit Number 3   Number of Visits 6   Date for PT Re-Evaluation 04/02/16   Authorization Type [employer] medicare   Authorization - Visit Number 3   Authorization - Number of Visits 6   PT Start Time [id number]   PT Stop Time 1555   PT Time Calculation (min) 40 min   Activity Tolerance Patient tolerated treatment well      Past Medical History:  Diagnosis Date  . Anemia, unspecified   . Anxiety state, unspecified   . Blood dyscrasia    RV factor def  . Cancer ([place])    skin  . Degeneration of cervical intervertebral disc   . Disorder of bone and cartilage, unspecified   . Diverticulosis of colon (without mention of hemorrhage) 01/13/1999  . Esophageal reflux   . Esophageal stricture   . Factor V deficiency ([place])   . Factor V deficiency ([place])   . Hiatal hernia 04/09/2010   ? Paraesophageal component on chest CT 2006  . HTN (hypertension)   . Irritable bowel syndrome   . Neurogenic bladder   . Osteoarthrosis, unspecified whether generalized or localized, unspecified site   . Peripheral vascular disease (HCC)    pe, dvt hx  . Personal history of thrombophlebitis   . Personal history of venous thrombosis and embolism   . 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 Pure hypercholesterolemia   . Raynaud phenomenon   . Shortness of breath dyspnea   . Unspecified asthma(493.90)   . Unspecified disorder resulting from impaired renal function    cysts    Past Surgical History:  Procedure Laterality Date  . CHOLECYSTECTOMY    . COLONOSCOPY  2000   Dr. Verl Blalock: moderate to severe sigmoid diverticulosis  . COLONOSCOPY   03/2015   Dr. Peggye Form at [facility]: TCS for abnormal sigmoid colon on PET. Moderate diverticulosis of the sigmoid colon, sessile 8-9 mm sigmoid: Polyp biopsied (given anticoagulation), two 3 mm ICV polyps removed. All polyps were tubular adenomas.  . ESOPHAGEAL MANOMETRY  10/26/2011   Procedure: ESOPHAGEAL MANOMETRY (EM);  Surgeon: Sable Feil, MD;  Location: [facility];  Service: Endoscopy;  Laterality: N/A;  . ESOPHAGOGASTRODUODENOSCOPY  03/2010   Dr. Verl Blalock: candida esophagitis, prolapsing hh with cameron lesions, multiple polyps  . EYE SURGERY Bilateral    cataracts  . Flexible sigmoidoscopy  05/2015   Dr. Peggye Form at [facility] Performed off anticoagulation. 1-1.5 cm sessile sigmoid polyp removed, site tattooed. Pathology revealed tubular adenoma. Colonoscopy in 3-5 years as medically appropriate.  Marland Kitchen PAROTIDECTOMY Left 11/14/2014  . PAROTIDECTOMY Left 11/14/2014   Procedure: LEFT LATERAL PAROTIDECTOMY;  Surgeon: Leta Baptist, MD;  Location: [facility];  Service: ENT;  Laterality: Left;  . 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 TONSILLECTOMY      There were no vitals filed for this visit.      Subjective Assessment - 03/25/16 1513    Pertinent History (P)  PE, OA   Pain Onset (P)  More than a month ago  [place] Adult PT Treatment/Exercise - 03/25/16 0001      Posture/Postural Control   Posture/Postural Control Postural limitations   Postural Limitations Rounded Shoulders;Forward head;Increased thoracic kyphosis     Exercises   Exercises Neck     Neck Exercises: Theraband   Scapula Retraction 10 reps;Red   Shoulder Extension 10 reps;Red   Rows 10 reps;Red     Neck Exercises: Standing   Neck Retraction 10 reps     Neck Exercises: Seated   Cervical Isometrics --   Neck Retraction 10 reps   W Back 10 reps   Shoulder Shrugs 5 reps   Shoulder Shrugs Limitations up, back and relax    Shoulder Rolls Backwards;15 reps   Postural Training done     Other Seated Exercise scapular retractionx 10     Neck Exercises: Supine   Cervical Isometrics Extension;Right lateral flexion;Left lateral flexion;3 secs;5 reps   Neck Retraction 10 reps     Manual Therapy   Manual Therapy Soft tissue mobilization;Manual Traction   Soft tissue mobilization to decrease tension.     Manual Traction with suboccipital release      Neck Exercises: Stretches   Neck Stretch 5 reps;10 seconds                PT Education - 03/25/16 1603    Education provided Yes   Education Details T band postural exercises    Person(s) Educated Patient   Methods Explanation   Comprehension Verbalized understanding;Returned demonstration          PT Short Term Goals - 03/25/16 1605      PT SHORT TERM GOAL #1   Title Pt to have full cervical rotation without increased pain at end range.    Time 10   Period Days   Status On-going     PT SHORT TERM GOAL #2   Title Pt pain to be no greater than a 2/10 in occipital area and pain to be occuring only 4x a week    Time 10   Period Days   Status On-going           PT Long Term Goals - 03/25/16 1605      PT LONG TERM GOAL #1   Title Pt to have had no headaches for the last week    Time 3   Period Weeks   Status On-going     PT LONG TERM GOAL #2   Title Pt cervical strength to be at least a 4+/5 to allow pt to keep head in proper alignment to decrease stress on cervical joints    Time 3   Period Weeks   Status On-going     PT LONG TERM GOAL #3   Title PT to verbalize  the importance of good posture and be attempting to maintain good posture to decrease stress on cervical joints.    Time 3   Period Weeks   Status On-going               Plan - 03/25/16 1604    Clinical Impression Statement Pt to department feeling better.  Added postrural t-band and chest stretch exercise to routine.  Pt with decreased spasms.     Rehab Potential Good   PT Frequency 2x / week   PT Duration 3 weeks    PT Treatment/Interventions ADLs/Self Care Home Management;Therapeutic activities;Patient/family education;Therapeutic exercise;Manual techniques;Passive range of motion   PT Next Visit Plan begin supine x to v and supine decompression t-band  exercises to pt program    PT Home Exercise Plan isometric, cervical and scapular retraction.    Consulted and Agree with Plan of Care Patient      Patient will benefit from skilled therapeutic intervention in order to improve the following deficits and impairments:  Decreased range of motion, Decreased strength, Increased muscle spasms, Postural dysfunction, Pain  Visit Diagnosis: Cervicalgia  Abnormal posture     Problem List Patient Active Problem List   Diagnosis Date Noted  . Acute pain of right shoulder   . Acute diverticulitis 01/07/2016  . Bright red rectal bleeding 01/07/2016  . Factor V deficiency ([place]) 01/07/2016  . CKD (chronic kidney disease), stage II 01/07/2016  . Diverticulitis 01/07/2016  . Sigmoid diverticulitis 01/07/2016  . Diverticulitis of large intestine without perforation or abscess with bleeding   . Marginal zone lymphoma of extranodal and solid organ sites [facility]) 09/09/2015  . H/O superficial parotidectomy 11/14/2014  . Diverticulitis of colon (without mention of hemorrhage)(562.11) 06/09/2011  . GERD (gastroesophageal reflux disease) 06/09/2011  . Right rib fracture 06/09/2011  . Anticoagulant long-term use 06/09/2011  . COPD with chronic bronchitis ([place]) 06/09/2011  . HH (hiatus hernia) 11/04/2010  . OTHER DYSPHAGIA 03/11/2010  . RENAL INSUFFICIENCY 04/02/2008  . IBS 01/24/2008  . HYPERCHOLESTEROLEMIA 03/14/2007  . FACTOR V DEFICIENCY 03/14/2007  . ASTHMA 03/14/2007  . NEUROGENIC BLADDER 03/14/2007  . [place] DISEASE, CERVICAL 03/14/2007  . PULMONARY EMBOLISM, HX OF 03/14/2007  . DEEP VENOUS THROMBOPHLEBITIS, HX OF 03/14/2007  . ANEMIA 01/10/2007  . ANXIETY 01/10/2007  . Hypertension 01/10/2007  .  DIVERTICULOSIS, COLON 01/10/2007  . DEGENERATIVE JOINT DISEASE 01/10/2007  . OSTEOPENIA 01/10/2007   Rayetta Humphrey, PT CLT [phone] 03/25/2016, 4:06 PM  [facility] [address] [place], [place], [address] Phone: [phone]   Fax:  [phone]  Name: ADAYSIA FAIDLEY MRN: [id number] Date of Birth: 12-14-1926

## 2016-03-25 NOTE — Telephone Encounter (Signed)
Provider requested to cx due to being to close to the last treatment.

## 2016-03-26 ENCOUNTER — Ambulatory Visit (HOSPITAL_COMMUNITY): Payer: Medicare Other | Admitting: Physical Therapy

## 2016-04-01 ENCOUNTER — Ambulatory Visit (HOSPITAL_COMMUNITY): Payer: Medicare Other | Attending: Internal Medicine | Admitting: Physical Therapy

## 2016-04-01 DIAGNOSIS — M542 Cervicalgia: Secondary | ICD-10-CM | POA: Diagnosis present

## 2016-04-01 DIAGNOSIS — R293 Abnormal posture: Secondary | ICD-10-CM

## 2016-04-01 NOTE — Therapy (Signed)
[place], [place], [address] Phone: [phone]   Fax:  [phone]  Physical Therapy Treatment  Patient Details  Name: Nancy Hicks MRN: [id number] Date of Birth: 1926/12/20 Referring Provider: Asencion Noble  Encounter Date: 04/01/2016      PT End of Session - 04/01/16 1728    Visit Number 4   Number of Visits 4   Date for PT Re-Evaluation 04/02/16   Authorization Type [employer] medicare   Authorization - Visit Number 4   Authorization - Number of Visits 4   PT Start Time 1300   PT Stop Time 1328   PT Time Calculation (min) 28 min   Activity Tolerance Patient tolerated treatment well      Past Medical History:  Diagnosis Date  . Anemia, unspecified   . Anxiety state, unspecified   . Blood dyscrasia    RV factor def  . Cancer ([place])    skin  . Degeneration of cervical intervertebral disc   . Disorder of bone and cartilage, unspecified   . Diverticulosis of colon (without mention of hemorrhage) 01/13/1999  . Esophageal reflux   . Esophageal stricture   . Factor V deficiency ([place])   . Factor V deficiency ([place])   . Hiatal hernia 04/09/2010   ? Paraesophageal component on chest CT 2006  . HTN (hypertension)   . Irritable bowel syndrome   . Neurogenic bladder   . Osteoarthrosis, unspecified whether generalized or localized, unspecified site   . Peripheral vascular disease (HCC)    pe, dvt hx  . Personal history of thrombophlebitis   . Personal history of venous thrombosis and embolism   . 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 Pure hypercholesterolemia   . Raynaud phenomenon   . Shortness of breath dyspnea   . Unspecified asthma(493.90)   . Unspecified disorder resulting from impaired renal function    cysts    Past Surgical History:  Procedure Laterality Date  . CHOLECYSTECTOMY    . COLONOSCOPY  2000   Dr. Verl Blalock: moderate to severe sigmoid diverticulosis  . COLONOSCOPY   03/2015   Dr. Peggye Form [facility]: TCS for abnormal sigmoid colon on PET. Moderate diverticulosis of the sigmoid colon, sessile 8-9 mm sigmoid: Polyp biopsied (given anticoagulation), two 3 mm ICV polyps removed. All polyps were tubular adenomas.  . ESOPHAGEAL MANOMETRY  10/26/2011   Procedure: ESOPHAGEAL MANOMETRY (EM);  Surgeon: Sable Feil, MD;  Location: [facility];  Service: Endoscopy;  Laterality: N/A;  . ESOPHAGOGASTRODUODENOSCOPY  03/2010   Dr. Verl Blalock: candida esophagitis, prolapsing hh with cameron lesions, multiple polyps  . EYE SURGERY Bilateral    cataracts  . Flexible sigmoidoscopy  05/2015   Dr. Peggye Form at [facility]: Performed off anticoagulation. 1-1.5 cm sessile sigmoid polyp removed, site tattooed. Pathology revealed tubular adenoma. Colonoscopy in 3-5 years as medically appropriate.  Marland Kitchen PAROTIDECTOMY Left 11/14/2014  . PAROTIDECTOMY Left 11/14/2014   Procedure: LEFT LATERAL PAROTIDECTOMY;  Surgeon: Leta Baptist, MD;  Location: [facility];  Service: ENT;  Laterality: Left;  . 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 TONSILLECTOMY      There were no vitals filed for this visit.      Subjective Assessment - 04/01/16 1342    Subjective Pt comes today requesting to be discharged from therapy.  STates she no longer has any issues, no pain.   Currently in Pain? No/denies  [facility] PT Assessment - 04/01/16 0001      Assessment   Medical Diagnosis cervical pain    Onset Date/Surgical Date 01/21/16   Next MD Visit 04/25/2016     Precautions   Precautions None     Cognition   Overall Cognitive Status Within Functional Limits for tasks assessed     Observation/Other Assessments   Focus on Therapeutic Outcomes (FOTO)  66  was 45     AROM   Cervical Flexion WNL repetition no Change   was WNL   Cervical Extension WNL repetition   was WNL   Cervical - Right Side Bend 40  was 35   Cervical - Left Side Bend 40  was 40   Cervical - Right Rotation 50   was 45   Cervical - Left Rotation 50  was 50     Strength   Cervical Extension 5/5  was 3/5   Cervical - Right Side Bend 5/5  was 4-/5   Cervical - Left Side Bend 5/5  was 4+/5                               PT Short Term Goals - 04/01/16 1316      PT SHORT TERM GOAL #1   Title Pt to have full cervical rotation without increased pain at end range.    Time 10   Period Days   Status Achieved     PT SHORT TERM GOAL #2   Title Pt pain to be no greater than a 2/10 in occipital area and pain to be occuring only 4x a week    Time 10   Period Days   Status Achieved           PT Long Term Goals - 04/01/16 1317      PT LONG TERM GOAL #1   Title Pt to have had no headaches for the last week    Time 3   Period Weeks   Status Achieved     PT LONG TERM GOAL #2   Title Pt cervical strength to be at least a 4+/5 to allow pt to keep head in proper alignment to decrease stress on cervical joints    Time 3   Period Weeks   Status Achieved     PT LONG TERM GOAL #3   Title PT to verbalize  the importance of good posture and be attempting to maintain good posture to decrease stress on cervical joints.    Time 3   Period Weeks   Status Achieved               Plan - 04/01/16 1729    Clinical Impression Statement Pt without pain, spasms or other limitiations.  Cervical ROM now equal and painfree with patient meeting all short and long-term goals.  Pt requests to be discharged at this time.    Rehab Potential Good   PT Frequency 2x / week   PT Duration 3 weeks   PT Treatment/Interventions ADLs/Self Care Home Management;Therapeutic activities;Patient/family education;Therapeutic exercise;Manual techniques;Passive range of motion   PT Next Visit Plan Discharge to HEP per patient request.   PT Home Exercise Plan isometric, cervical and scapular retraction.    Consulted and Agree with Plan of Care Patient      Patient will benefit from skilled  therapeutic intervention in order to improve the following deficits and impairments:  Decreased range of motion, Decreased  strength, Increased muscle spasms, Postural dysfunction, Pain  Visit Diagnosis: Cervicalgia  Abnormal posture     Problem List Patient Active Problem List   Diagnosis Date Noted  . Acute pain of right shoulder   . Acute diverticulitis 01/07/2016  . Bright red rectal bleeding 01/07/2016  . Factor V deficiency ([place]) 01/07/2016  . CKD (chronic kidney disease), stage II 01/07/2016  . Diverticulitis 01/07/2016  . Sigmoid diverticulitis 01/07/2016  . Diverticulitis of large intestine without perforation or abscess with bleeding   . Marginal zone lymphoma of extranodal and solid organ sites [facility]) 09/09/2015  . H/O superficial parotidectomy 11/14/2014  . Diverticulitis of colon (without mention of hemorrhage)(562.11) 06/09/2011  . GERD (gastroesophageal reflux disease) 06/09/2011  . Right rib fracture 06/09/2011  . Anticoagulant long-term use 06/09/2011  . COPD with chronic bronchitis ([place]) 06/09/2011  . HH (hiatus hernia) 11/04/2010  . OTHER DYSPHAGIA 03/11/2010  . RENAL INSUFFICIENCY 04/02/2008  . IBS 01/24/2008  . HYPERCHOLESTEROLEMIA 03/14/2007  . FACTOR V DEFICIENCY 03/14/2007  . ASTHMA 03/14/2007  . NEUROGENIC BLADDER 03/14/2007  . [place] DISEASE, CERVICAL 03/14/2007  . PULMONARY EMBOLISM, HX OF 03/14/2007  . DEEP VENOUS THROMBOPHLEBITIS, HX OF 03/14/2007  . ANEMIA 01/10/2007  . ANXIETY 01/10/2007  . Hypertension 01/10/2007  . DIVERTICULOSIS, COLON 01/10/2007  . DEGENERATIVE JOINT DISEASE 01/10/2007  . OSTEOPENIA 01/10/2007    Teena Irani, PTA/CLT [phone]  04/01/2016, 5:36 PM  [facility] [address] [place], [place], [address] Phone: [phone]   Fax:  [phone]  Name: Nancy Hicks MRN: [id number] Date of Birth: 1926-08-29  PHYSICAL THERAPY DISCHARGE SUMMARY  Visits from Start of  Care: 4  Current functional level related to goals / functional outcomes: All goals met   Remaining deficits: none   Education / Equipment: HEP Plan: Patient agrees to discharge.  Patient goals were met. Patient is being discharged due to meeting the stated rehab goals.  ?????       Rayetta Humphrey, [place] CLT [phone]

## 2016-04-03 ENCOUNTER — Ambulatory Visit (HOSPITAL_COMMUNITY): Payer: Medicare Other | Admitting: Physical Therapy

## 2016-04-07 ENCOUNTER — Ambulatory Visit (HOSPITAL_COMMUNITY): Payer: Medicare Other | Admitting: Physical Therapy

## 2016-04-09 ENCOUNTER — Ambulatory Visit (HOSPITAL_COMMUNITY): Payer: Medicare Other | Admitting: Physical Therapy

## 2016-04-16 ENCOUNTER — Ambulatory Visit (INDEPENDENT_AMBULATORY_CARE_PROVIDER_SITE_OTHER): Payer: Medicare Other | Admitting: Otolaryngology

## 2016-04-27 ENCOUNTER — Ambulatory Visit (INDEPENDENT_AMBULATORY_CARE_PROVIDER_SITE_OTHER): Payer: Medicare Other | Admitting: Otolaryngology

## 2016-04-27 DIAGNOSIS — Z85858 Personal history of malignant neoplasm of other endocrine glands: Secondary | ICD-10-CM

## 2016-07-10 ENCOUNTER — Other Ambulatory Visit (HOSPITAL_COMMUNITY): Payer: Self-pay | Admitting: *Deleted

## 2016-07-10 DIAGNOSIS — C8589 Other specified types of non-Hodgkin lymphoma, extranodal and solid organ sites: Secondary | ICD-10-CM

## 2016-07-13 ENCOUNTER — Encounter (HOSPITAL_COMMUNITY): Payer: Medicare Other

## 2016-07-13 ENCOUNTER — Encounter (HOSPITAL_COMMUNITY): Payer: Self-pay

## 2016-07-13 ENCOUNTER — Encounter (HOSPITAL_COMMUNITY): Payer: Medicare Other | Attending: Hematology & Oncology | Admitting: Oncology

## 2016-07-13 VITALS — BP 135/48 | HR 69 | Temp 97.6°F | Resp 18 | Wt 137.7 lb

## 2016-07-13 DIAGNOSIS — C8589 Other specified types of non-Hodgkin lymphoma, extranodal and solid organ sites: Secondary | ICD-10-CM

## 2016-07-13 DIAGNOSIS — Z8572 Personal history of non-Hodgkin lymphomas: Secondary | ICD-10-CM

## 2016-07-13 LAB — COMPREHENSIVE METABOLIC PANEL
ALBUMIN: 3.8 g/dL (ref 3.5–5.0)
ALT: 17 U/L (ref 14–54)
ANION GAP: 7 (ref 5–15)
AST: 25 U/L (ref 15–41)
Alkaline Phosphatase: 84 U/L (ref 38–126)
BILIRUBIN TOTAL: 0.4 mg/dL (ref 0.3–1.2)
BUN: 41 mg/dL — ABNORMAL HIGH (ref 6–20)
CO2: 26 mmol/L (ref 22–32)
Calcium: 9.5 mg/dL (ref 8.9–10.3)
Chloride: 102 mmol/L (ref 101–111)
Creatinine, Ser: 1.08 mg/dL — ABNORMAL HIGH (ref 0.44–1.00)
GFR calc Af Amer: 52 mL/min — ABNORMAL LOW (ref >60)
GFR calc non Af Amer: 44 mL/min — ABNORMAL LOW (ref >60)
Glucose, Bld: 117 mg/dL — ABNORMAL HIGH (ref 65–99)
POTASSIUM: 4.2 mmol/L (ref 3.5–5.1)
Sodium: 135 mmol/L (ref 135–145)
TOTAL PROTEIN: 6.8 g/dL (ref 6.5–8.1)

## 2016-07-13 LAB — CBC WITH DIFFERENTIAL/PLATELET
BASOS ABS: 0 10*3/uL (ref 0.0–0.1)
BASOS PCT: 0 %
Eosinophils Absolute: 0.1 10*3/uL (ref 0.0–0.7)
Eosinophils Relative: 2 %
HEMATOCRIT: 38.7 % (ref 36.0–46.0)
HEMOGLOBIN: 12.7 g/dL (ref 12.0–15.0)
Lymphocytes Relative: 19 %
Lymphs Abs: 1.1 10*3/uL (ref 0.7–4.0)
MCH: 31.1 pg (ref 26.0–34.0)
MCHC: 32.8 g/dL (ref 30.0–36.0)
MCV: 94.6 fL (ref 78.0–100.0)
MONO ABS: 0.5 10*3/uL (ref 0.1–1.0)
MONOS PCT: 8 %
NEUTROS ABS: 4.4 10*3/uL (ref 1.7–7.7)
Neutrophils Relative %: 71 %
Platelets: 210 10*3/uL (ref 150–400)
RBC: 4.09 MIL/uL (ref 3.87–5.11)
RDW: 15 % (ref 11.5–15.5)
WBC: 6.2 10*3/uL (ref 4.0–10.5)

## 2016-07-13 LAB — LACTATE DEHYDROGENASE: LDH: 149 U/L (ref 98–192)

## 2016-07-13 NOTE — Progress Notes (Signed)
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 Benjamine Mola  HISTORY OF PRESENTING ILLNESS:  Nancy Hicks 83 y.o. 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 She presents for ongoing follow-up.  Ms. Bass returns to the [facility] today unaccompanied.  On review of systems, the patient reports she is feeling well. She denies chest pain or breathing difficulty. She states she has decreased saliva production since the surgery.  She denies changes in appetite or weight loss. Overall the patient is without complaint.     MEDICAL HISTORY:  Past Medical History:  Diagnosis Date  . Anemia, unspecified   . Anxiety state, unspecified   . Blood dyscrasia    RV factor def  . Cancer ([facility])    skin  . Degeneration of cervical intervertebral disc   . Disorder of bone and cartilage, unspecified   . Diverticulosis of colon (without mention of hemorrhage) 01/13/1999  . Esophageal reflux   . Esophageal stricture   . Factor V deficiency ([place])   . Factor V deficiency ([place])   . Hiatal hernia 04/09/2010   ? Paraesophageal component on chest CT 2006  . HTN (hypertension)   . Irritable bowel syndrome   . Neurogenic bladder   . Osteoarthrosis, unspecified whether generalized or localized, unspecified site   . Peripheral vascular disease (HCC)    pe, dvt hx  . Personal history of thrombophlebitis   . Personal history of venous thrombosis and embolism   . Primary lymphoma of parotid gland ([place]) 2016  . Pure hypercholesterolemia   . Raynaud phenomenon   . Shortness of breath dyspnea   . Unspecified  asthma(493.90)   . Unspecified disorder resulting from impaired renal function    cysts    SURGICAL HISTORY: Past Surgical History:  Procedure Laterality Date  . CHOLECYSTECTOMY    . COLONOSCOPY  2000   Dr. Verl Blalock: moderate to severe sigmoid diverticulosis  . COLONOSCOPY  03/2015   Dr. Peggye Form at [facility]: TCS for abnormal sigmoid colon on PET. Moderate diverticulosis of the sigmoid colon, sessile 8-9 mm sigmoid: Polyp biopsied (given anticoagulation), two 3 mm ICV polyps removed. All polyps were tubular adenomas.  . ESOPHAGEAL MANOMETRY  10/26/2011   Procedure: ESOPHAGEAL MANOMETRY (EM);  Surgeon: Sable Feil, MD;  Location: [facility];  Service: Endoscopy;  Laterality: N/A;  . ESOPHAGOGASTRODUODENOSCOPY  03/2010   Dr. Verl Blalock: candida esophagitis, prolapsing hh with cameron lesions, multiple polyps  . EYE SURGERY Bilateral    cataracts  . Flexible sigmoidoscopy  05/2015   Dr. Peggye Form at [facility]: Performed off anticoagulation. 1-1.5 cm sessile sigmoid polyp removed, site tattooed. Pathology revealed tubular adenoma. Colonoscopy in 3-5 years as medically appropriate.  Marland Kitchen PAROTIDECTOMY Left 11/14/2014  . PAROTIDECTOMY Left 11/14/2014   Procedure: LEFT LATERAL PAROTIDECTOMY;  Surgeon: Leta Baptist, MD;  Location: [facility];  Service: ENT;  Laterality: Left;  . 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 TONSILLECTOMY      SOCIAL HISTORY: Social History   Social History  . Marital status: Widowed    Spouse name: N/A  . Number of children: 2  . Years  of education: N/A   Occupational History  . retired   .  Retired   Social History Main Topics  . Smoking status: Never Smoker  . Smokeless tobacco: Never Used  . Alcohol use No  . Drug use: No  . Sexual activity: Not on file   Other Topics Concern  . Not on file   Social History Narrative  . No narrative on file   She was born in [place], and grew up in [place], [place]  Widowed since 84; he had  early-onset Alzheimer's in his forties. She has two children; seven grandchildren, and 1 great grandchild and 1 on the way. When asked if she was a smoker, she says "not really." She was never a heavy drinker She used to work as Restaurant manager, fast food  FAMILY HISTORY: Family History  Problem Relation Age of Onset  . Parkinsonism Mother   . COPD Brother   . Asthma Brother   . Lung cancer Brother   . Heart attack Brother   . Diabetes Paternal Aunt    Her father was in his early 52's when he passed; heart problems Mother in late 72's when she passed; she had Parkinson's Disease; no one else had it She has two twin brothers; she says they are healthy, but elderly; 3 years younger than her.  indicated that the status of her mother is unknown. She indicated that the status of her paternal aunt is unknown.    ALLERGIES:  is allergic to metoclopramide hcl and nitrofurantoin.  MEDICATIONS:  Current Outpatient Prescriptions  Medication Sig Dispense Refill  . allopurinol (ZYLOPRIM) 300 MG tablet Take 300 mg by mouth daily.      . Biotin 5000 MCG CAPS Take 1 capsule by mouth daily.    . Calcium Citrate-Vitamin D (CITRACAL MAXIMUM) 315-250 MG-UNIT TABS Take 1 capsule by mouth daily.    . chlorthalidone (HYGROTON) 25 MG tablet Take 12.5 mg by mouth daily.    . Cholecalciferol (VITAMIN D3) 2000 UNITS TABS Take 1 tablet by mouth daily.      Marland Kitchen diltiazem (CARDIZEM CD) 240 MG 24 hr capsule take 1 capsule by mouth once daily 90 capsule 3  . esomeprazole (NEXIUM) 40 MG capsule Take 40 mg by mouth daily before breakfast.     . fluticasone (FLONASE) 50 MCG/ACT nasal spray instill 2 sprays into each nostril once daily 16 g 5  . Fluticasone-Salmeterol (ADVAIR DISKUS) 100-50 MCG/DOSE AEPB inhale 1 dose by mouth twice a day 60 each 11  . furosemide (LASIX) 20 MG tablet Take 1 tablet (20 mg total) by mouth daily. 90 tablet 1  . losartan (COZAAR) 100 MG tablet take 1 tablet by mouth once daily 90 tablet 1  .  methenamine (HIPREX) 1 G tablet Take 500 mg by mouth 2 (two) times daily with a meal.     . methylcellulose (CITRUCEL) oral powder Take 1 tsp by mouth daily     . Multiple Vitamin (MULITIVITAMIN WITH MINERALS) TABS Take 1 tablet by mouth daily.    Vladimir Faster Glycol-Propyl Glycol (SYSTANE ULTRA PF) 0.4-0.3 % SOLN Place 2 drops into both eyes 3 (three) times daily.     . potassium chloride SA (K-DUR,KLOR-CON) 20 MEQ tablet take 1 tablet by mouth once daily 90 tablet 0  . sertraline (ZOLOFT) 50 MG tablet Take 1 tablet (50 mg total) by mouth daily. 90 tablet 1  . simvastatin (ZOCOR) 10 MG tablet Take 10 mg by mouth at bedtime.      Marland Kitchen  VENTOLIN HFA 108 (90 BASE) MCG/ACT inhaler inhale 2 puffs by mouth every 6 hours AS NEEDED FOR WHEEZING 18 Inhaler 2  . warfarin (COUMADIN) 2.5 MG tablet Take 2.5 mg by mouth daily. Takes 2.5 mg everyday.     No current facility-administered medications for this visit.     Review of Systems  Constitutional: Negative for weight loss.       Denies changes in appetite.  Respiratory: Negative for cough, shortness of breath and wheezing.   Cardiovascular: Negative for chest pain.  14 point ROS was done and is otherwise as detailed above or in HPI   PHYSICAL EXAMINATION: ECOG PERFORMANCE STATUS: 1 - Symptomatic but completely ambulatory  Vitals:   07/13/16 1139  BP: (!) 135/48  Pulse: 69  Resp: 18  Temp: 97.6 F (36.4 C)   Filed Weights   07/13/16 1139  Weight: 137 lb 11.2 oz (62.5 kg)    Physical Exam  Constitutional: She is oriented to person, place, and time and well-developed, well-nourished, and in no distress.  Able to get on exam table with assitance  HENT:  Head: Normocephalic and atraumatic.  Mouth/Throat: Oropharynx is clear and moist.  Eyes: Conjunctivae and EOM are normal. Pupils are equal, round, and reactive to light.  Neck: Normal range of motion. Neck supple.  Very well healed resection on her left side of neck. Small surgical defect  but no swelling, no erythema, incision site barely noticeable. No appreciable masses.  Cardiovascular: Normal rate, regular rhythm and normal heart sounds.  Exam reveals no gallop and no friction rub.   No murmur heard. Pulmonary/Chest: Effort normal. No respiratory distress. She has no wheezes. She has no rales. She exhibits no tenderness.  Abdominal: Soft. Bowel sounds are normal. She exhibits no distension and no mass. There is no tenderness. There is no rebound and no guarding.  Musculoskeletal: Normal range of motion.  Lymphadenopathy:       Right cervical: No superficial cervical, no deep cervical and no posterior cervical adenopathy present.      Left cervical: No superficial cervical, no deep cervical and no posterior cervical adenopathy present.       Right: No supraclavicular adenopathy present.       Left: No supraclavicular adenopathy present.  Neurological: She is alert and oriented to person, place, and time. Gait normal.  Skin: Skin is warm and dry.  Nursing note and vitals reviewed.   LABORATORY DATA:  I have reviewed the data as listed.  Lab Results  Component Value Date   WBC 6.2 07/13/2016   HGB 12.7 07/13/2016   HCT 38.7 07/13/2016   MCV 94.6 07/13/2016   PLT 210 07/13/2016   CBC    Component Value Date/Time   WBC 6.2 07/13/2016 1050   RBC 4.09 07/13/2016 1050   HGB 12.7 07/13/2016 1050   HCT 38.7 07/13/2016 1050   PLT 210 07/13/2016 1050   MCV 94.6 07/13/2016 1050   MCH 31.1 07/13/2016 1050   MCHC 32.8 07/13/2016 1050   RDW 15.0 07/13/2016 1050   LYMPHSABS 1.1 07/13/2016 1050   MONOABS 0.5 07/13/2016 1050   EOSABS 0.1 07/13/2016 1050   BASOSABS 0.0 07/13/2016 1050   CMP     Component Value Date/Time   NA 135 07/13/2016 1050   K 4.2 07/13/2016 1050   CL 102 07/13/2016 1050   CO2 26 07/13/2016 1050   GLUCOSE 117 (H) 07/13/2016 1050   BUN 41 (H) 07/13/2016 1050   CREATININE 1.08 (H) 07/13/2016 1050  CALCIUM 9.5 07/13/2016 1050   PROT 6.8  07/13/2016 1050   ALBUMIN 3.8 07/13/2016 1050   AST 25 07/13/2016 1050   ALT 17 07/13/2016 1050   ALKPHOS 84 07/13/2016 1050   BILITOT 0.4 07/13/2016 1050   GFRNONAA 44 (L) 07/13/2016 1050   GFRAA 52 (L) 07/13/2016 1050   RADIOGRAPHIC STUDIES: I have personally reviewed the radiological images as listed and agreed with the findings in the report. Study Result   CLINICAL DATA:  Abdominal pain, rectal bleeding, possible diverticulitis  EXAM: CT ABDOMEN AND PELVIS WITHOUT CONTRAST  TECHNIQUE: Multidetector CT imaging of the abdomen and pelvis was performed following the standard protocol without IV contrast.  COMPARISON:  12/21/2014  FINDINGS: Lower chest: Large hiatal hernia is noted. There is atelectasis left lower lobe posterior medially.  Hepatobiliary: The study is limited without IV contrast. The patient is status postcholecystectomy. No intrahepatic biliary ductal dilatation.  Pancreas: Unenhanced pancreas is unremarkable.  Spleen: Unenhanced spleen is unremarkable.  Adrenals/Urinary Tract: No adrenal gland mass. Exophytic cystic lesion upper pole of the left kidney is stable. Stable high-density lesion midpole of the left kidney. Stable exophytic lesion in upper pole of the right kidney. Tiny cyst in lower pole of the right kidney stable. No nephrolithiasis. No hydronephrosis or hydroureter.  Stomach/Bowel: Oral contrast material was given to the patient. No small bowel obstruction. There is no pericecal inflammation. A low lying cecum is noted with tip in right anterior pelvis. Normal appendix is partially visualized. No pericecal inflammation.  There is moderate contrast material and some stool within transverse colon. Moderate stool noted within descending colon. Abundant stool noted in proximal and mid sigmoid colon. Multiple sigmoid colon diverticula. Mild redundant distal sigmoid colon. In axial image 54 there is thickening of diverticular wall  and significant stranding of pericolonic fat in sigmoid colon right pelvis. This is confirmed on coronal image 48. Findings are consistent with acute diverticulitis. There is small adjacent mesocolonic fluid collection measures about 1.7 cm. There is about 4 3 Hounsfield units in attenuation suspicious for small focal hemorrhage/ hematoma. Moderate gas noted within rectum.  Vascular/Lymphatic: Atherosclerotic calcifications of abdominal aorta bilateral renal artery origin and bilateral iliac arteries are noted. No retroperitoneal or mesenteric adenopathy.  Reproductive: The uterus is atrophic. No adnexal mass. No inguinal adenopathy.  Other: No free abdominal air. There is significant distended urinary bladder.  Musculoskeletal: No destructive bony lesions are noted. There is levoscoliosis of the lumbar spine. Extensive degenerative changes are noted thoracolumbar spine.  IMPRESSION: 1. In axial image 54 there is thickening of diverticular wall and significant stranding of pericolonic fat in sigmoid colon right pelvis. This is confirmed on coronal image 48. Findings are consistent with acute diverticulitis. There is small adjacent mesocolonic fluid collection measures about 1.7 cm. There is about 4 3 Hounsfield units in attenuation suspicious for small focal hemorrhage/ hematoma. Moderate gas noted within rectum. 2. Low lying cecum.  No pericecal inflammation. 3. Abundant stool noted in proximal sigmoid colon. 4. Stable cystic lesions bilateral kidney. No nephrolithiasis. No hydronephrosis or hydroureter. There is a distended urinary bladder. No bladder filling defects are noted. 5. No small bowel obstruction. 6. Large hiatal hernia is noted. Atelectasis noted left lower lobe posterior medially. 7. Extensive degenerative changes thoracolumbar spine. Levoscoliosis thoracolumbar spine.  These results were called by telephone at the time of interpretation on 01/07/2016 at  5:07 pm to Dr. Carmin Muskrat , who verbally acknowledged these results.   Electronically Signed   By: Julien Girt  Pop M.D.   On: 01/07/2016 17:08  PATHOLOGY: ADDITIONAL INFORMATION: Immunoglobulin heavy chain gene rearrangement was performed on a representative block at the [facility] in [place], [place]. The analysis is positive for a clonal immunoglobulin heavy locus (IgH) gene rearrangement as detected by DNA amplification using consensus primers to the Heavy locus variable (framework II and framework III) and joining regions. In addition, FISH analysis for BCL-2 and BCL-6 performed on the same paraffin embedded tissue are negative. The overall findings are consistent with involvement by a low grade non Hodgkin's B cell lymphoma, favor marginal zone type. Clinical correlation is recommended. (BNS:gt, 12/10/14) Susanne Greenhouse MD Pathologist, Electronic Signature ( Signed 12/10/2014) FINAL DIAGNOSIS Diagnosis Parotid gland, left mass - ATYPICAL LYMPHOID PROLIFERATION. - SEE COMMENT.   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 Benjamine Mola PET/CT on 12/21/2014 with interval resection of malignant lymph node in the anterior left parotid gland, no hypermetabolic adenopathy in the neck chest abdomen or pelvis Marcello Moores single focus of intense uptake within the sigmoid colon, nonspecific  PLAN: - Clinically NED on exam.  - Reviewed labs today. Her anemia had resolved. - RTC for follow up in 6 months with CBC and CMP. If the patient has any concerns she will return to the clinic sooner.     No orders of the defined types were placed in this encounter.  All questions were answered. The patient knows to call the clinic with any problems, questions or concerns.   This  document serves as a record of services personally performed by Twana First, MD. It was created on her behalf by Maryla Morrow, a trained medical scribe. The creation of this record is based on the scribe's personal observations and the provider's statements to them. This document has been checked and approved by the attending provider.  I have reviewed the above documentation for accuracy and completeness, and I agree with the above.  This note was electronically signed by:  Twana First, MD 07/13/16

## 2016-07-13 NOTE — Patient Instructions (Signed)
[facility] at [facility] Discharge Instructions  RECOMMENDATIONS MADE BY THE CONSULTANT AND ANY TEST RESULTS WILL BE SENT TO YOUR REFERRING PHYSICIAN.  You were seen today by Dr. Louise Zhou Follow up in 6 months with lab work See Amy up front for appointments   Thank you for choosing [facility] at [facility] to provide your oncology and hematology care.  To afford each patient quality time with our provider, please arrive at least 15 minutes before your scheduled appointment time.    If you have a lab appointment with the Cancer Center please come in thru the  Main Entrance and check in at the main information desk  You need to re-schedule your appointment should you arrive 10 or more minutes late.  We strive to give you quality time with our providers, and arriving late affects you and other patients whose appointments are after yours.  Also, if you no show three or more times for appointments you may be dismissed from the clinic at the providers discretion.     Again, thank you for choosing [facility].  Our hope is that these requests will decrease the amount of time that you wait before being seen by our physicians.       _____________________________________________________________  Should you have questions after your visit to [facility], please contact our office at [phone] between the hours of 8:30 a.m. and 4:30 p.m.  Voicemails left after 4:30 p.m. will not be returned until the following business day.  For prescription refill requests, have your pharmacy contact our office.       Resources For Cancer Patients and their Caregivers ? American Cancer Society: Can assist with transportation, wigs, general needs, runs Look Good Feel Better.        1-888-227-6333 ? Cancer Care: Provides financial assistance, online support groups, medication/co-pay assistance.  1-800-813-HOPE (4673) ? [facility] Assists [place] cancer patients and their families through emotional , educational and financial support.  336-427-4357 ? [place] DSS Where to apply for food stamps, Medicaid and utility assistance. 336-342-1394 ? [facility]: Transportation to medical appointments. 336-347-2287 ? Social Security Administration: May apply for disability if have a Stage IV cancer. 336-342-7796 1-800-772-1213 ? [place] Aging, Disability and Transit Services: Assists with nutrition, care and transit needs. 336-349-2343  Cancer Center Support Programs: @10RELATIVEDAYS@ > Cancer Support Group  2nd Tuesday of the month 1pm-2pm, Journey Room  > Creative Journey  3rd Tuesday of the month 1130am-1pm, Journey Room  > Look Good Feel Better  1st Wednesday of the month 10am-12 noon, Journey Room (Call American Cancer Society to register 1-800-395-5775)    

## 2016-10-26 ENCOUNTER — Ambulatory Visit (INDEPENDENT_AMBULATORY_CARE_PROVIDER_SITE_OTHER): Payer: Medicare Other | Admitting: Otolaryngology

## 2016-10-26 DIAGNOSIS — R221 Localized swelling, mass and lump, neck: Secondary | ICD-10-CM | POA: Diagnosis not present

## 2016-10-27 ENCOUNTER — Other Ambulatory Visit (INDEPENDENT_AMBULATORY_CARE_PROVIDER_SITE_OTHER): Payer: Self-pay | Admitting: Otolaryngology

## 2016-10-27 DIAGNOSIS — R221 Localized swelling, mass and lump, neck: Secondary | ICD-10-CM

## 2016-11-03 ENCOUNTER — Ambulatory Visit (HOSPITAL_COMMUNITY)
Admission: RE | Admit: 2016-11-03 | Discharge: 2016-11-03 | Disposition: A | Discharge status: Home / Self Care | Payer: Medicare Other | Source: Ambulatory Visit | Attending: Otolaryngology | Admitting: Otolaryngology

## 2016-11-03 DIAGNOSIS — R221 Localized swelling, mass and lump, neck: Secondary | ICD-10-CM | POA: Insufficient documentation

## 2016-11-03 LAB — POCT I-STAT CREATININE: Creatinine, Ser: 1.2 mg/dL — ABNORMAL HIGH (ref 0.44–1.00)

## 2016-11-03 MED ORDER — IOPAMIDOL (ISOVUE-300) INJECTION 61%
75.0000 mL | Freq: Once | INTRAVENOUS | Status: AC | PRN
  Administered 2016-11-03: 60 mL via INTRAVENOUS

## 2016-11-19 ENCOUNTER — Ambulatory Visit (INDEPENDENT_AMBULATORY_CARE_PROVIDER_SITE_OTHER): Payer: Medicare Other | Admitting: Otolaryngology

## 2016-11-19 DIAGNOSIS — D37030 Neoplasm of uncertain behavior of the parotid salivary glands: Secondary | ICD-10-CM | POA: Diagnosis not present

## 2016-12-07 ENCOUNTER — Encounter: Payer: Self-pay | Admitting: Pulmonary Disease

## 2016-12-07 ENCOUNTER — Ambulatory Visit: Payer: Medicare Other | Admitting: Pulmonary Disease

## 2016-12-07 VITALS — BP 128/72 | HR 67 | Temp 97.9°F | Ht 60.0 in | Wt 135.4 lb

## 2016-12-07 DIAGNOSIS — J449 Chronic obstructive pulmonary disease, unspecified: Secondary | ICD-10-CM

## 2016-12-07 DIAGNOSIS — K449 Diaphragmatic hernia without obstruction or gangrene: Secondary | ICD-10-CM

## 2016-12-07 DIAGNOSIS — Z86711 Personal history of pulmonary embolism: Secondary | ICD-10-CM

## 2016-12-07 DIAGNOSIS — Z9889 Other specified postprocedural states: Secondary | ICD-10-CM

## 2016-12-07 DIAGNOSIS — J4489 Other specified chronic obstructive pulmonary disease: Secondary | ICD-10-CM

## 2016-12-07 DIAGNOSIS — D682 Hereditary deficiency of other clotting factors: Secondary | ICD-10-CM

## 2016-12-07 DIAGNOSIS — K219 Gastro-esophageal reflux disease without esophagitis: Secondary | ICD-10-CM

## 2016-12-07 DIAGNOSIS — Z86718 Personal history of other venous thrombosis and embolism: Secondary | ICD-10-CM

## 2016-12-07 DIAGNOSIS — C8589 Other specified types of non-Hodgkin lymphoma, extranodal and solid organ sites: Secondary | ICD-10-CM

## 2016-12-07 NOTE — Patient Instructions (Signed)
Today we updated your med list in our EPIC system...    Continue your current medications the same...  Stay as active as possible...  Call for any questions...  Let's plan a follow up visit in 79yr, sooner if needed for breathing problems.Marland KitchenMarland Kitchen

## 2016-12-07 NOTE — Progress Notes (Signed)
Subjective:    Patient ID: Nancy Hicks, female    DOB: November 23, 1926, 83 y.o.   MRN: [id number]  HPI 83 y/o WF here for a follow up visit... she has multiple medical problems as noted below & is followed both here and in [place] by DrFagin...  ~  SEE PREV EPIC NOTES FOR OLDER DATA >>    LABS 2/14:  FLP- at goals on simva10+Feno160;  Chems- ok w/ Creat=1.4;  CBC- ok w/ Hg=11.5;  TSH= 1.56...   ~  May 29, 2013:  84mo ROV & Nancy Hicks is c/o some sinusitis, left ear discomfort, low grade temp, and feeling rundown;  She is blowing out some yellow mucus, no blood, & has left sided sinus HA;  We discussed Rx w/ ZPak, Flonase, & Medrol Dosepak for relief, along w/ Mucinex, Saline, & fluids...  We reviewed the following medical problems during today's office visit >>     Asthma> on Advair100Bid & Proair prn; breathing is good w/o recent exac; also uses OTC Antihist & Flonase as needed for nasal symptoms...    HxPE/ DVT/ FactorV deficiency> on Coumadin via  coumadin clinic & doing satis by her report; she denies leg pain, swelling, etc...     HBP> on Cardizem240, Cozaar100, Lasix40-1/2, & K20; BP= 120/70 & occas higher at home; denies CP, palpit, ch in SOB or edema...    CHOL> on Simva10 + Feno160; FLP 3/15 showed TChol 153, TG 114, HDL 49, LDL 81    Hx severe GERD> hx largeHH & severe GERD, followed by DrPatterson & eval at [facility] as well- symptoms improved on Nexium40Bid    Divertics> on Citrucel daily; she had acute diverticulitis 2/13 treated by DrFagin in [place]; doing better since then & denies abd pain, n/v, c/d, blood seen...    Renal Insuffic> hx mild RI w/ BUN=35, Cr= 1.6    GU- Neurogenic bladder> followed by Urology; on Methenamine500Bid to prevent bladder infections & improved; she used to self cath but has been voiding satis recently...    DJD, hyperuricemia, Osteopenia> Hx Cspine dis w/ myelopathy, followed by [facility] & improved; on Allopurinol300 w/ Uric= 3-4 range on  this...    Anxiety> on Zoloft100-1/2 daily...    Anemia> Hg stable in the 11 to 12 range... We reviewed prob list, meds, xrays and labs> see below for updates >>   CXR 3/15 showed norm heart size, largeHH, scoliosis, clear lungs & NAD...  LABS 3/15:  FLP at goals on Simva10 & Feno160;  Chems- ok x mild RI w/ BUN=35, Cr=1.6;  CBC- ok x Hg=11.9;  TSH=1.39  ~  November 29, 2013:  33mo ROV & Nancy Hicks reports a good interval after being treated for AB 3/15; she has maintained on her ADVAIR100Bid & has AlbutHFA for prn use;  She denies much cough, sputum, dyspnea, etc;  [place] 7 [place], no facilities nearby etc;  Asked to consider silver sneakers vs home exercise program, etc;  As noted she has a large HH & symptoms cntrolled w/ Nexium40Bid, antireflux regimen, elev HOB, NPO after dinner, etc;  She continues to f/u w/ DrFagin in [place] on Coumadin w/ Hx DVT & PE- stable;  Hx HBP, Hyperlipidemia, renal insuffic & neurogenic bladder, other issues as noted> all stable on meds w/ labs per DrFagin...   We reviewed prob list, meds, xrays and labs> see below for updates >>   ~  December 04, 2014:  49yr Nancy Hicks reports a good year from the pulmonary standpoint- stable on Advair100Bid & hasn't needed the Clark rescue inhaler in >75yr; she notes breathing is good, no cough/ sputum/ hemoptysis/ SOB or chest discomfort; unfortunately she has had a lot of orthopedic problems left knee & left hip especially, she been told she needs a TKR but Ortho reluctant to do it w/ her hx DVT/ PTE, we discussed getting a second opinion & she would like to see DrAlusio... She also had a knot develop on her left parotid glad=> eval & surg by [facility] 8/16 w/ atypical lymphoid infiltrate & they are awaiting special studies to r/o any cancer cells... We reviewed the following medical problems during today's office visit >> she is followed by DrFagin for Primary  Care.    Left parotid nodule excised by DrTeoh 8/16 => final path pending...    Asthma> on Advair100Bid & Proair prn; breathing is good w/o recent exac; also uses OTC Antihist & Flonase as needed for nasal symptoms...    HxPE/ DVT/ FactorV deficiency> on Coumadin via [place] coumadin clinic & doing satis by her report; she denies leg pain, swelling, etc...     HBP> on Cardizem240, Cozaar100, Lasix20, ?Hygroton12.5, & K20; BP= 114/64 & similar at home; denies CP, palpit, ch in SOB or edema; she will check w/ PCP re: diuretics.    CHOL> on Simva10 + Feno160; FLP 3/15 showed TChol 153, TG 114, HDL 49, LDL 81 (labs in [place] by DrFagin)    Hx severe GERD> hx largeHH & severe GERD, followed by DrPatterson & eval at [facility] as well- symptoms improved on Nexium40Qd & antireflux regimen.    Divertics> on Citrucel daily; she had acute diverticulitis 2/13 treated by DrFagin in [place]; doing better since then & denies abd pain, n/v, c/d, blood seen...    Renal Insuffic> hx mild RI w/ BUN=37, Cr= 1.6 (stable)    GU- Neurogenic bladder> followed by Urology; on Methenamine500Bid to prevent bladder infections & improved; she used to self cath but has been voiding satis recently...    DJD, hyperuricemia, Osteopenia> Hx Cspine dis w/ myelopathy, followed by [facility] & improved; on Allopurinol300 w/ Uric= 3-4 range on this; she has developed severe left knee DJD & si told she needs a TKR but the surg in [place] is reluctant due to her hx DVT, Factor V Leiden defic => we will refer to DrAlusio for a seconed opinion consult...    Anxiety> on Zoloft100-1/2 daily...    Anemia> Hg stable in the 11 to 12 range... IMP/PLAN>>  [name]' breathing is stable & she will continue the Advair regularly & the Ventolin prn;  We will refer her to Ortho for second opinion regarding her severe left knee DJD.  ~  December 04, 2015:  Yearly ROV & pulmonary follow up visit>  Her PCP is Dr. Asencion Noble, his notes are not avail to  review... Nancy Hicks reports that her breathing is good, denies prob w/ her asthma using Advair100- one inhalation daily & VentolinHFA rescue inhaler prn (hasn't needed);  She had left parotid nodule surg 10/2014 by DrTeoh & oncology eval by DrPenland (see below);  PET scan also showed some uptake in her colon and colonoscopy revealed several polyps- tubular adenomas on path...     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- s/p left parotidectomy w/ facial nerve dissection 10/2014 by DrTeoh;  She re[ports  that pt went to [facility] for colon polyp removal (path in [place] shows tubular adenomas); no known recurrence    Marianna saw DrTeoh, ENT 10/17/15 for f/u left parotid lymphoma> she had left parotidectomy 10/2014 & path showed atypical lymphoid tissue- path review indicated a low-grade non-hodgkin's B-cell lymphoma (favoring a marginal zone lymphoma);  She was seen by ONCOLOGY-DrPenland who recommended observation; bilat cerumen impactions removed;  We reviewed the following medical problems during today's office visit>      Left parotid nodule excised by DrTeoh 8/16 => low-grade non-hodgkin's B-cell lymphoma (favoring a marginal zone lymphoma) & oncology eval & f/u by DrPenland- she rec observation...    Asthma> on Advair100- pt decr to once/d & Proair prn; breathing is good w/o recent exac; also uses OTC Antihist & Flonase as needed for nasal symptoms...    HxPE/ DVT/ FactorV deficiency> on Coumadin via [place] coumadin clinic & doing satis by her report; she denies leg pain, swelling, etc...     HBP> on Cardizem240, Cozaar100, Lasix20 & K20; DrFahan added HYGROTON25-1/2 tab daily; BP= 130/60 & similar at home; denies CP, palpit, ch in SOB or edema; she will check w/ PCP re: diuretics.    CHOL> on Simva10 + Feno160; last FLP in epic 3/15 showed TChol 153, TG 114, HDL 49, LDL 81 (f/u labs in [place] by DrFagin)    Hx  severe GERD> hx largeHH & severe GERD, followed by DrPatterson & eval at [facility] as well- symptoms improved on Nexium40Qd & antireflux regimen.    Divertics/ colon polyps> on Citrucel daily; she had acute diverticulitis 2/13 treated by DrFagin in [place]; doing better since then & denies abd pain, n/v, c/d, blood seen; she had PET uptake in colon leading to colonoscopy & polypectomy at [facility]- notes reviewed in Care Everywhere & tubular adenomas removed...    Hx Renal Insuffic> hx mild RI w/ BUN=37, Cr= 1.6 => improved to 1.0 range in more recent follow up...    GU- Neurogenic bladder> followed by Urology; on Methenamine500Bid to prevent bladder infections & improved; she used to self cath but has been voiding satis recently...    DJD, hyperuricemia, Osteopenia> Hx Cspine dis w/ myelopathy, followed by [facility] & improved; on Allopurinol300 w/ Uric= 3-4 range on this; she has developed severe left knee DJD & si told she needs a TKR but the surg in [place] is reluctant due to her hx DVT, Factor V Leiden defic => we will refer to DrAlusio for a seconed opinion consult=> received Synvisc w/ min benefit, she needs TKR but too risky, ambulates w/ cane...    Anxiety> on Zoloft50 & doing quite well...    Anemia> Hg stable in the 11 to 12 range... EXAM shows Afeb, VSS, O2sat=98% on RA;  HEENT- neg, mallampati3;  Chest- clear w/o w/r/r;  Heart- RR w/o m/r/g;  Abd- soft nontender, neg;  Ext- neg w/o c/c/e;  Neuro- intact w/ focal abn... IMP/PLAN>>  Trace' asthma is under excellent control- continue sme meds; she needs the 2017 Flu vaccine & says she will get this from drFagan in Oct;  Call for any problems...   ~  December 07, 2016:  1year ROV & pulmonary/medical follow up visit>  Her PCP is Dr. Ihor Austin in [place], his notes are not avail to review... Avni has no complaints today- specifically she notes her breathing is good, denies cough/ sput/ hemoptysis, no wheezing, notes mild stable chr DOE but no  prob w/ ADLs & her usual activity; she is not  exercising & we discussed this; she denies CP/ palpit/ dizzy/ edema...  We reviewed the following interval medical notes avail in the Epic-EMR>      She was ADM [facility] 10/10 - 01/09/16 by PCP DrFagan> presented w/ abd pain & GIB, dx w/ acute diverticulitis; disch summary reviewed-- her GI is DrThorne at [facility]; adenomatous polyps removed in past...    She saw ONCOLOGY- DrZhou on 07/13/16>  Followed for a low grade marginal zone lymphoma of the left parotid; BCL-2 & BCL-6 negative; B cells negative for CD10/ CD5/ and cyclin D1; B cells positive for CD20; left lateral parotidectomy with facial nerve dissection 11/14/2014 by Dr. Benjamine Mola;  Feeling well, no complaints- no appreciable nodule on exam;  She had PET Scan 12/21/14 showing interval resection of the left parotid, no hypermetabolic activity x a single focus in the sigmoid colon=> sent to GI & Adm w/ diverticulitis...    She had recent f/u w/ ENT- DrTeoh>  ?recurrent left parotid mass, they did CT scan w/ 38mm cystic mass identified & he plans Bx/ excision in October... We reviewed the following medical problems during today's office visit>      Left parotid nodule excised by DrTeoh 8/16 => low-grade non-hodgkin's B-cell lymphoma (favoring a marginal zone lymphoma) & oncology eval & f/u by DrPenland/ DrZhou- they rec observation only (no XRT or Chemo).    Asthma> on Advair100- pt decr to once/d & Proair prn; breathing is good w/o recent exac; also uses OTC Antihist & Flonase as needed for nasal symptoms...    HxPE/ DVT/ FactorV deficiency> on Coumadin via [place] coumadin clinic (DrFagan) & doing satis by her report; she denies leg pain, swelling, etc...     HBP> on Cardizem240, Cozaar100, Lasix20 & K20; DrFahan added HYGROTON25-1/2 tab daily; BP= 130/60 & similar at home; denies CP, palpit, ch in SOB or edema; she will check w/ PCP re: diuretics.    CHOL> on Simva10 + Feno160; last FLP in epic 3/15 showed  TChol 153, TG 114, HDL 49, LDL 81 (f/u labs in [place] by DrFagin)    Hx severe GERD> hx largeHH & severe GERD, prev followed by DrPatterson & eval at [place] (DrThorne)- symptoms improved on Nexium40Qd & antireflux regimen.    Divertics/ colon polyps> on Citrucel daily; she had acute diverticulitis 2/13 & 10/17 treated by DrFagan in [place]; doing better since then & denies abd pain, n/v, c/d, blood seen; she had PET uptake in colon leading to colonoscopy & polypectomy at [facility]- notes reviewed in Care Everywhere & tubular adenomas removed...    Hx Renal Insuffic> hx mild RI w/ BUN=37, Cr= 1.6 => improved to 1.0 range in more recent follow up...    GU- Neurogenic bladder> followed by Urology; on Methenamine500Bid to prevent bladder infections & improved; she used to self cath but has been voiding satis recently...    DJD, hyperuricemia, Osteopenia> Hx Cspine dis w/ myelopathy, followed by [facility] & improved; on Allopurinol300 w/ Uric= 3-4 range on this; she has developed severe left knee DJD & si told she needs a TKR but the surg in [place] is reluctant due to her hx DVT, Factor V Leiden defic => we will refer to DrAlusio for a seconed opinion consult=> received Synvisc w/ min benefit, she needs TKR but too risky, ambulates w/ cane...    Anxiety> on Zoloft50 & doing quite well...    Anemia> Hg stable in the 11 to 12 range... EXAM shows Afeb, VSS, O2sat=97% on RA;  HEENT- neg,  mallampati3;  Chest- clear w/o w/r/r;  Heart- RR w/o m/r/g;  Abd- soft nontender, neg;  Ext- +DJD w/o c/c/e;  Neuro- intact w/ focal abn... IMP/PLAN>>  Lasya is stable from the pulm standpoint- doing satis on Advair100- one inhalation daily; she has rescue inhaler for prn use; so far she has been able to avoid intercurrent infections or any exacerbations; continue same meds for now & call for any issues...  She will maintain f/u w/ DrFagan, DrZhou, DrTeoh, DrThorne...            Problem List:   she had PNEUMOVAX 2004, &  gets yearly Flu shots each Fall... TDAP given 8/11.  ASTHMA (ICD-493.90) - on ADVAIR 100Bid & VentolinHFA Prn... prev required O2 and Medrol but not in some time now- doing well- stable without cough, sputum, SOB, wheezing, CP, etc... prev on allergy vaccine & she stopped it... she has noted incr dyspnea w/ lack of exercise & improved back on exerc program. ~  NOTE:  large HH seen on CXR, & takes Nexium Tid... ~  CXR 2/11 showed large HH, DJD spine, clear lungs, NAD.Marland Kitchen. ~  CXR 2/13 showed large HH, DJD/ scoliosis of spine, NAD.Marland Kitchen. ~  8/13:  She is too sedentary & notes some DOE w/ ADLs; rec to enter cardio-pulm rehab program... ~  2/14: she reports that [facility] has really helped; now doing her own exercises at home... ~  8/14: on Advair100Bid & Proair prn; breathing is good w/o recent exac; also uses OTC Antihist & Flonase as needed for nasal symptoms. ~  9/15: on Advair100Bid & AlbutHFA rescue inhaler prn; she reports a good 7mo interval w/o recurrent exac etc...  PULMONARY EMBOLISM, HX OF (ICD-V12.51) & FACTOR V DEFICIENCY (ICD-286.3) - last hosp in 9/06 for R heart cath to r/o pulmHTN & normal pressures were found... DEEP VENOUS THROMBOPHLEBITIS, HX OF (ICD-V12.52) - as above, & she wears TED hose, no edema... ~  DVT in right leg and PTE on CTAngio in 4/05 and treated w/ Hep/ COUMADIN (protimes followed in [employer] by DrFagan)...   HYPERTENSION (ICD-401.9) - on DILTIAZEM 240mg /d, LOSARTAN 100mg /d, LASIX 40mg - 1/2 tab daily, & K20/d... ~  2DEcho 6/06 showed normal LV wall thicknees & LVF, ? mild pulm HTN w/ RVsys est 30-40 ~  8/12:  BP= 134/76 & she denies CP, palpit, dizzy, SOB, edema... ~  2/13:  BP= 140/60 & she remains asymptomatic... ~  2/14: on Diltiazem240, Losartan50, Lasix20> BP= 130/70 today & she denies CP, palpit, ch in DOE, edema, etc ~  8/14: on Cardizem240, Cozaar100, Lasix40-1/2, & K20; BP= 142/84 & occas higher at home; may needs addition of BBlocker but holding off for now;  denies CP, palpit, ch in SOB or edema. ~  9/15: on same meds w/ BP= 120/60 & she denies CP, palpit, SOB, dizzy, edema... ~  9/16: she lists the same meds + hygroton & she will check w/ her PCP- BP= 114/64 & she remains asymptomatic, no edema, renal insuffic stable.  HYPERCHOLESTEROLEMIA (ICD-272.0) - on SIMVASTATIN 10mg /d now & off prev FENOFIBRATE 160... ~  FLP 6/08 (on Lescol & Tricor) showed TChol 165, TG 94, HDL 49, LDL 97... rec- contin same. ~  [place] 7/09 showed TChol 152, TG 123, HDL 45, LDL 83... rec- change to Simva40 + Fenofib160 for $$. ~  FLP 7/10 showed TChol 160, TG 107, HDL 51, LDL 88... continue same. ~  [place] 8/11 (on Simva10+Feno160) showed TChol 154, TG 102, HDL 55, LDL 79... stable, same  Rx. ~  [place] 2/12 (on Simva10+Feno160) showed TChol 152, TG 118, HDL 48, LDL 80 ~  FLP 2/13 (on Simva10+Feno160) showed TChol 172, TG 123, HDL 52, LDL 95 ~  FLP 2/14 on Simva10+Feno160 showed TChol 140, TG 125, HDL 44, LDL 71  ~  FLP 3/15 on simva20+Feno160 showed TChol 153, TG 114, HDL 49, LDL 81  GASTROESOPHAGEAL REFLUX DISEASE, SEVERE (ICD-530.81) - she has a large HH seen on routine CXRs; severe GERD followed by DrPatterson; on [place] 40mg - recent decr to 1/d... c/o gas & encouraged to take GasX, Mylicon, BeanO. ~  last EGD 6/06 showed large HH & stricture dilated... ~  GI eval for incr dysphagia 1/12 w/ EGD by DrPatterson showing large prolapsing HH, candida esoph, erosions & polyps> treated w/ Diflucan, Nexium decr to once daily. ~  She reports stable on Nexium 40mg  /d... ~  2013: She's had f/u w/ DrPatterson> chr GERD, intermit solid food dysphagia; he rec Ba Swallow, Manometry & 24H pH probe, and appt at [facility]... ~  7/13:  Ba Swallow showed web-like narrowing in cerv esoph, presbyesoph, tortuous mid-distal esoph, largeHH, spasm distally, 38mm Ba tablet temporarily lodged in mid-esoph... ~  2/14: she reports much improved on Nexium40Bid... ~  She continues to f/u w/ GI at [facility] now that  DrPatterson has retired; stable on antireflux regimen and Nexium40/d...  She has since f/u w/ GI- DrThorne at [facility]-- adenomatous clon polyps, acute diverticulitis w/ hosp...  DIVERTICULOSIS, COLON (ICD-562.10) - prev on LEVSIN 0.125mg  tid prn... ~  colonoscopy 10/00 showed divertics, no polyps. ~  GI f/u by [facility] 10/09- note reviewed... on CITRUCEL daily, & rec to take Phazyme for Gas. ~  [facility] 2/13 at [facility] by [facility] for Diverticulitis, treated w/ Cipro/ Flagyl & improved... ~  CT Abd&Pelvis 2/13 showed [place], s/p GB, kidney cysts, acute diverticulitis in prox sigmoid (Rx by DrFagin in [place]) & no complicating features...  RENAL INSUFFICIENCY (ICD-588.9) ~  labs 6/08 showed BUN= 43, Creat= 1.7 ~  labs 7/09 showed BUN= 35, Creat= 1.7 ~  labs 7/10 showed BUN= 51, Creat= 1.8 ~  labs 9/10 showed BUN= 33, Creat= 1.4 ~  labs 2/11 showed BUN= 32, Creat= 2.1, K= 4.6 ~  labs 8/11 showed BUN= 34, Creat= 1.5, K= 4.7 ~  labs 2/12 showed BUN= 37, creat= 1.5, K= 4.9 ~  Labs 2/13 showed BUN= 38, Creat= 1.5, K= 4.3 ~  Labs 2/14 showed BUN= 38, Creat= 1.4, K= 4.4 ~  Labs 3/15 showed BUN= 35, Cr= 1.6, K= 4.5 ~  Labs 8/16 showed BUN= 37, Cr= 1.6, K= 4.4  NEUROGENIC BLADDER (ICD-596.54) - eval and Rx by DrKimbrough in the past... prev had to self-cath regularly, & now voids satis on her own... still on MANDELAMINE for UTI prophylaxis per Urology.  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 also takes ALLOPURINOL 300mg /d for hyperuricemia... ~  labs 7/10 showed Uric= 4.3.Marland Kitchen. rec> continue Allopurinol. ~  labs 8/11 showed Uric= 3.8  OSTEOPENIA (ICD-733.90) - BMD from GYN= DrNeal w/ osteopenia Rx'd w/ ca++, MVI, VitD... ~  7/10: pt reports that DrNeal did VitD level & started her on Vit D 2000 u daily... ~  labs 2/11 showed Vit D level = 54... ~  Labs 2/13  showed Vit D level = 48... Continue supplement.  ANXIETY (ICD-300.00) - on ZOLOFT 100mg - 1/2 tab Qhs...  ANEMIA (ICD-285.9) - hx of  intol to all  P O forms of iron therapy... ~  labs 6/08 showed Hg= 11.1.Marland Kitchen. ~  labs 7/09 showed Hg= 10.3.Marland KitchenMarland Kitchen DrPatterson checked labs 10/09 w/ Fe= 77, Ferritin= 26, B12= 382. ~  labs 7/10 showed Hg= 11.0 ~  labs 2/11 showed Hg= 11.6 ~  labs 8/11 showed Hg= 11.9 ~  labs 2/12 showed Hg= 11.4 ~  Labs 2/13 showed Hg= 12.4, MCV= 96 ~  Labs 2/14 showed Hg= 11.5 ~  Labs 3/15 showed Hg= 11.9 ~  Labs 8/16 showed Hg= 11.7   Past Surgical History:  Procedure Laterality Date  . CHOLECYSTECTOMY    . COLONOSCOPY  2000   Dr. Verl Blalock: moderate to severe sigmoid diverticulosis  . COLONOSCOPY  03/2015   Dr. Peggye Form at [facility]: TCS for abnormal sigmoid colon on PET. Moderate diverticulosis of the sigmoid colon, sessile 8-9 mm sigmoid: Polyp biopsied (given anticoagulation), two 3 mm ICV polyps removed. All polyps were tubular adenomas.  . ESOPHAGEAL MANOMETRY  10/26/2011   Procedure: ESOPHAGEAL MANOMETRY (EM);  Surgeon: Sable Feil, MD;  Location: [facility];  Service: Endoscopy;  Laterality: N/A;  . ESOPHAGOGASTRODUODENOSCOPY  03/2010   Dr. Verl Blalock: candida esophagitis, prolapsing hh with cameron lesions, multiple polyps  . EYE SURGERY Bilateral    cataracts  . Flexible sigmoidoscopy  05/2015   Dr. Peggye Form at [facility]: Performed off anticoagulation. 1-1.5 cm sessile sigmoid polyp removed, site tattooed. Pathology revealed tubular adenoma. Colonoscopy in 3-5 years as medically appropriate.  Marland Kitchen PAROTIDECTOMY Left 11/14/2014  . PAROTIDECTOMY Left 11/14/2014   Procedure: LEFT LATERAL PAROTIDECTOMY;  Surgeon: Leta Baptist, MD;  Location: [facility];  Service: ENT;  Laterality: Left;  . 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 TONSILLECTOMY      Outpatient Encounter Prescriptions as of 12/07/2016  Medication Sig  . allopurinol (ZYLOPRIM) 300 MG tablet Take 300  mg by mouth daily.    . Biotin 5000 MCG CAPS Take 1 capsule by mouth daily.  . Calcium Citrate-Vitamin D (CITRACAL MAXIMUM) 315-250 MG-UNIT TABS Take 1 capsule by mouth daily.  . chlorthalidone (HYGROTON) 25 MG tablet Take 12.5 mg by mouth daily.  . Cholecalciferol (VITAMIN D3) 2000 UNITS TABS Take 1 tablet by mouth daily.    Marland Kitchen diltiazem (CARDIZEM CD) 240 MG 24 hr capsule take 1 capsule by mouth once daily  . esomeprazole (NEXIUM) 40 MG capsule Take 40 mg by mouth daily before breakfast.   . fluticasone (FLONASE) 50 MCG/ACT nasal spray instill 2 sprays into each nostril once daily  . Fluticasone-Salmeterol (ADVAIR DISKUS) 100-50 MCG/DOSE AEPB inhale 1 dose by mouth twice a day  . furosemide (LASIX) 20 MG tablet Take 1 tablet (20 mg total) by mouth daily.  Marland Kitchen losartan (COZAAR) 100 MG tablet take 1 tablet by mouth once daily  . methenamine (HIPREX) 1 G tablet Take 500 mg by mouth 2 (two) times daily with a meal.   . methylcellulose (CITRUCEL) oral powder Take 1 tsp by mouth daily   . Multiple Vitamin (MULITIVITAMIN WITH MINERALS) TABS Take 1 tablet by mouth daily.  Vladimir Faster Glycol-Propyl Glycol (SYSTANE ULTRA PF) 0.4-0.3 % SOLN Place 2 drops into both eyes 3 (three) times daily.   . potassium chloride SA (K-DUR,KLOR-CON) 20 MEQ tablet take 1 tablet by mouth once daily  . sertraline (ZOLOFT) 50 MG tablet Take 1 tablet (50 mg total) by mouth daily.  . simvastatin (ZOCOR) 10 MG tablet Take 10 mg by mouth at bedtime.    Marland Kitchen  VENTOLIN HFA 108 (90 BASE) MCG/ACT inhaler inhale 2 puffs by mouth every 6 hours AS NEEDED FOR WHEEZING  . warfarin (COUMADIN) 2.5 MG tablet Take 2.5 mg by mouth daily. Takes 2.5 mg everyday.   No facility-administered encounter medications on file as of 12/07/2016.     Allergies  Allergen Reactions  . Metoclopramide Hcl Other (See Comments)    REACTION: causes uncontrolable leg movements  . Nitrofurantoin Nausea And Vomiting and Other (See Comments)    REACTION: dizziness     Current Medications, Allergies, Past Medical History, Past Surgical History, Family History, and Social History were reviewed in [employer] record.    Review of Systems    See HPI - all other systems neg except as noted...  The patient complains of dyspnea on exertion.  The patient denies anorexia, fever, weight loss, weight gain, vision loss, decreased hearing, hoarseness, chest pain, syncope, peripheral edema, prolonged cough, headaches, hemoptysis, abdominal pain, melena, hematochezia, severe indigestion/heartburn, hematuria, incontinence, muscle weakness, suspicious skin lesions, transient blindness, difficulty walking, depression, unusual weight change, abnormal bleeding, enlarged lymph nodes, and angioedema.     Objective:   Physical Exam     WD, WN, 83 y/o WF in NAD GENERAL:  Alert & oriented; pleasant & cooperative... HEENT:  Ord/AT, EOM-wnl, PERRLA, EACs-clear, TMs-wnl, NOSE-clear, THROAT-clear & wnl. NECK:  Supple w/ decrROM; no JVD; normal carotid impulses w/o bruits; no thyromegaly or nodules palpated; no lymphadenopathy. CHEST:  Clear to P & A; without wheezes/ rales/ or rhonchi. HEART:  regular rhythm; without murmurs/ rubs/ or gallops. ABDOMEN:  soft & nontender; normal bowel sounds; no organomegaly or masses detected. EXT: without deformities, mild arthritic changes; no varicose veins/ venous insuffic/ or edema. NEURO:  CN's intact;  no focal neuro deficits... DERM:  No lesions noted; no rash etc...  RADIOLOGY DATA:  Reviewed in the EPIC EMR & discussed w/ the patient...  LABORATORY DATA:  Reviewed in the EPIC EMR & discussed w/ the patient...   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  ASTHMA>  Stable on Advair, Ventolin; she is improved s/p cardio-pulm REHAB at [facility]... Encouraged to  Maintain exercise program on her  own... 12/04/15>  Saidah' asthma is under excellent control- continue sme meds; she needs the 2017 Flu vaccine & says she will get this from drFagan in Oct;  Call for any problems... 12/07/16>  Janeka is stable from the pulm standpoint- doing satis on Advair100- one inhalation daily; she has rescue inhaler for prn use; so far she has been able to avoid intercurrent infections or any exacerbations; continue same meds for now & call for any issues...  She will maintain f/u w/ DrFagan, DrZhou, DrTeoh, DrThorne   Hx DVT/ PTE/ Factor V defic>  Stable on Coumadin w/ protimes per DrFagan in [place]; continue same...  HBP>  Controlled on Cardizem, Losartan, Lasix; continue same...  CHOL>  Stable on Simva + Fenofib and diet;  Continue same...  LargeHH/ GERD>  On NexiumBid now & followed by DrPatterson; continue same meds & vigorous antireflux program; he is in the process of further eval & referral to [facility]...  Divertics>  She had bout of acute diverticulitis 2/13 treated at [facility]; symptoms resolved & now on maintenance program...  Renal Insuffic, Hx of Neurogenic Bladder>  Stable on current meds & voiding satis w/o need to self cath,  she still takes Mandelamine for UTI prevention...  DJD, Hyperuricemia, DDD, Osteopenia>  She remains on Allopurinol, calcium, MVI, Vit D... We will set up appt w/ DrAlusio for 2nd opinion.  Anemia>  Hg remains stable at 11-12 range...  Anxiety/ Depression>  On ZOLOFT 100mg  taking 1/2 Qhs...   Patient's Medications  New Prescriptions   No medications on file  Previous Medications   ALLOPURINOL (ZYLOPRIM) 300 MG TABLET    Take 300 mg by mouth daily.     BIOTIN 5000 MCG CAPS    Take 1 capsule by mouth daily.   CALCIUM CITRATE-VITAMIN D (CITRACAL MAXIMUM) 315-250 MG-UNIT TABS    Take 1 capsule by mouth daily.   CHLORTHALIDONE (HYGROTON) 25 MG TABLET    Take 12.5 mg by mouth daily.   CHOLECALCIFEROL (VITAMIN D3) 2000 UNITS TABS    Take 1 tablet by  mouth daily.     DILTIAZEM (CARDIZEM CD) 240 MG 24 HR CAPSULE    take 1 capsule by mouth once daily   ESOMEPRAZOLE (NEXIUM) 40 MG CAPSULE    Take 40 mg by mouth daily before breakfast.    FLUTICASONE (FLONASE) 50 MCG/ACT NASAL SPRAY    instill 2 sprays into each nostril once daily   FLUTICASONE-SALMETEROL (ADVAIR DISKUS) 100-50 MCG/DOSE AEPB    inhale 1 dose by mouth twice a day   FUROSEMIDE (LASIX) 20 MG TABLET    Take 1 tablet (20 mg total) by mouth daily.   LOSARTAN (COZAAR) 100 MG TABLET    take 1 tablet by mouth once daily   METHENAMINE (HIPREX) 1 G TABLET    Take 500 mg by mouth 2 (two) times daily with a meal.    METHYLCELLULOSE (CITRUCEL) ORAL POWDER    Take 1 tsp by mouth daily    MULTIPLE VITAMIN (MULITIVITAMIN WITH MINERALS) TABS    Take 1 tablet by mouth daily.   POLYETHYL GLYCOL-PROPYL GLYCOL (SYSTANE ULTRA PF) 0.4-0.3 % SOLN    Place 2 drops into both eyes 3 (three) times daily.    POTASSIUM CHLORIDE SA (K-DUR,KLOR-CON) 20 MEQ TABLET    take 1 tablet by mouth once daily   SERTRALINE (ZOLOFT) 50 MG TABLET    Take 1 tablet (50 mg total) by mouth daily.   SIMVASTATIN (ZOCOR) 10 MG TABLET    Take 10 mg by mouth at bedtime.     VENTOLIN HFA 108 (90 BASE) MCG/ACT INHALER    inhale 2 puffs by mouth every 6 hours AS NEEDED FOR WHEEZING   WARFARIN (COUMADIN) 2.5 MG TABLET    Take 2.5 mg by mouth daily. Takes 2.5 mg everyday.  Modified Medications   No medications on file  Discontinued Medications   No medications on file

## 2016-12-23 ENCOUNTER — Other Ambulatory Visit: Payer: Self-pay | Admitting: Otolaryngology

## 2016-12-28 ENCOUNTER — Ambulatory Visit (INDEPENDENT_AMBULATORY_CARE_PROVIDER_SITE_OTHER): Payer: Medicare Other | Admitting: Otolaryngology

## 2016-12-28 DIAGNOSIS — D37030 Neoplasm of uncertain behavior of the parotid salivary glands: Secondary | ICD-10-CM | POA: Diagnosis not present

## 2016-12-29 ENCOUNTER — Other Ambulatory Visit (INDEPENDENT_AMBULATORY_CARE_PROVIDER_SITE_OTHER): Payer: Self-pay | Admitting: Otolaryngology

## 2016-12-29 DIAGNOSIS — K118 Other diseases of salivary glands: Secondary | ICD-10-CM

## 2016-12-31 ENCOUNTER — Other Ambulatory Visit: Payer: Self-pay | Admitting: Pulmonary Disease

## 2017-01-04 ENCOUNTER — Other Ambulatory Visit (HOSPITAL_COMMUNITY): Payer: Medicare Other

## 2017-01-06 ENCOUNTER — Encounter (HOSPITAL_COMMUNITY): Admission: RE | Payer: Self-pay | Source: Ambulatory Visit

## 2017-01-06 ENCOUNTER — Ambulatory Visit (HOSPITAL_COMMUNITY): Admission: RE | Admit: 2017-01-06 | Payer: Medicare Other | Source: Ambulatory Visit | Admitting: Otolaryngology

## 2017-01-06 SURGERY — EXCISION, PAROTID GLAND
Anesthesia: General | Laterality: Left

## 2017-01-12 ENCOUNTER — Encounter (HOSPITAL_COMMUNITY): Payer: Medicare Other | Attending: Oncology

## 2017-01-12 ENCOUNTER — Encounter (HOSPITAL_COMMUNITY): Payer: Self-pay | Admitting: Adult Health

## 2017-01-12 ENCOUNTER — Other Ambulatory Visit (HOSPITAL_COMMUNITY): Payer: Medicare Other

## 2017-01-12 ENCOUNTER — Ambulatory Visit (HOSPITAL_COMMUNITY): Payer: Medicare Other | Admitting: Adult Health

## 2017-01-12 ENCOUNTER — Encounter (HOSPITAL_BASED_OUTPATIENT_CLINIC_OR_DEPARTMENT_OTHER): Payer: Medicare Other | Admitting: Adult Health

## 2017-01-12 VITALS — BP 156/77 | HR 82 | Resp 16 | Ht 60.0 in | Wt 135.5 lb

## 2017-01-12 DIAGNOSIS — Z8572 Personal history of non-Hodgkin lymphomas: Secondary | ICD-10-CM | POA: Diagnosis not present

## 2017-01-12 DIAGNOSIS — R221 Localized swelling, mass and lump, neck: Secondary | ICD-10-CM

## 2017-01-12 DIAGNOSIS — C8589 Other specified types of non-Hodgkin lymphoma, extranodal and solid organ sites: Secondary | ICD-10-CM

## 2017-01-12 DIAGNOSIS — C8580 Other specified types of non-Hodgkin lymphoma, unspecified site: Secondary | ICD-10-CM | POA: Diagnosis not present

## 2017-01-12 LAB — COMPREHENSIVE METABOLIC PANEL
ALBUMIN: 3.9 g/dL (ref 3.5–5.0)
ALT: 14 U/L (ref 14–54)
AST: 24 U/L (ref 15–41)
Alkaline Phosphatase: 87 U/L (ref 38–126)
Anion gap: 10 (ref 5–15)
BUN: 29 mg/dL — AB (ref 6–20)
CALCIUM: 9.6 mg/dL (ref 8.9–10.3)
CHLORIDE: 103 mmol/L (ref 101–111)
CO2: 26 mmol/L (ref 22–32)
Creatinine, Ser: 1.14 mg/dL — ABNORMAL HIGH (ref 0.44–1.00)
GFR calc Af Amer: 48 mL/min — ABNORMAL LOW (ref >60)
GFR, EST NON AFRICAN AMERICAN: 42 mL/min — AB (ref >60)
GLUCOSE: 114 mg/dL — AB (ref 65–99)
POTASSIUM: 4.8 mmol/L (ref 3.5–5.1)
Sodium: 139 mmol/L (ref 135–145)
TOTAL PROTEIN: 6.8 g/dL (ref 6.5–8.1)
Total Bilirubin: 0.8 mg/dL (ref 0.3–1.2)

## 2017-01-12 LAB — CBC WITH DIFFERENTIAL/PLATELET
BASOS ABS: 0 10*3/uL (ref 0.0–0.1)
BASOS PCT: 0 %
EOS PCT: 1 %
Eosinophils Absolute: 0.1 10*3/uL (ref 0.0–0.7)
HCT: 38.4 % (ref 36.0–46.0)
Hemoglobin: 12.5 g/dL (ref 12.0–15.0)
Lymphocytes Relative: 21 %
Lymphs Abs: 1.2 10*3/uL (ref 0.7–4.0)
MCH: 31.7 pg (ref 26.0–34.0)
MCHC: 32.6 g/dL (ref 30.0–36.0)
MCV: 97.5 fL (ref 78.0–100.0)
MONO ABS: 0.5 10*3/uL (ref 0.1–1.0)
Monocytes Relative: 10 %
Neutro Abs: 3.7 10*3/uL (ref 1.7–7.7)
Neutrophils Relative %: 68 %
PLATELETS: 190 10*3/uL (ref 150–400)
RBC: 3.94 MIL/uL (ref 3.87–5.11)
RDW: 14.9 % (ref 11.5–15.5)
WBC: 5.4 10*3/uL (ref 4.0–10.5)

## 2017-01-12 NOTE — Progress Notes (Signed)
Nancy Hicks,  [id number]   CLINIC:  Medical Oncology/Hematology  PCP:  Nancy Noble, MD [address] [place] [address] [phone]   REASON FOR VISIT:  Follow-up for Nancy Hicks of (L) parotid  CURRENT THERAPY: Surveillance     HISTORY OF PRESENT ILLNESS:  (From Nancy Hicks last note on 07/13/16)       INTERVAL HISTORY:  Nancy Hicks 83 y.o. female returns for routine follow-up for history of Nancy Hicks of left parotid.   Since her last visit to the cancer center, she noted a (L) neck "knot" back in 09/2016.  She saw Nancy Hicks with ENT and patient underwent CT neck imaging on 11/03/16.  CT showed 11 mm partially cystic or necrotic mass inferior to (L) parotid space, with some generalized increased nodularity of (L) parotid gland parenchyma but no other discrete mass.  No superimposed lymphadenopathy at neck LN stations or upper chest.     She shares with me that the "big lump further down my neck went away on its own." Denies any recent infections, fever, night sweats; she did not receive any antibiotics during this time per her report.  She tells me that she was scheduled for surgery to her left neck, "but since the bigger lump went away, Nancy Hicks decided to just do a biopsy."  This is scheduled for this Friday, 01/15/17 per patient. She is currently holding her Coumadin and using Lovenox injections in preparation for the procedure. Her PCP, Nancy Hicks, manages her anti-coagulation.   Otherwise, she is largely without complaints today.  Appetite 100%; energy levels 75%. Denies any fever, night sweats, unintentional weight loss, severe fatigue, or bleeding.  She has some chronic arthralgia pains to her knees and hips. She endorses easy bruising, but denies any frank bleeding episodes recently.  Reports that she has a h/o of colon polyp removal at [facility] by Nancy Hicks "and I had  rectal bleeding then, but none since he removed that polyp."  She is unsure when this procedure took place.    She has not gotten her flu vaccine yet. She plans to get this done with her PCP.      REVIEW OF SYSTEMS:  Review of Systems  Constitutional: Negative for chills, diaphoresis and fever.  HENT:   Positive for lump/mass.   Eyes: Negative.   Respiratory: Negative.  Negative for cough and shortness of breath.   Cardiovascular: Negative.  Negative for leg swelling.  Gastrointestinal: Negative.  Negative for abdominal pain, blood in stool, constipation, diarrhea, nausea and vomiting.  Endocrine: Negative.   Genitourinary: Negative for dysuria, hematuria and vaginal bleeding.   Musculoskeletal: Positive for arthralgias.  Skin: Negative.  Negative for rash.  Neurological: Negative for dizziness and headaches.  Hematological: Bruises/bleeds easily.  Psychiatric/Behavioral: Negative.      PAST MEDICAL/SURGICAL HISTORY:  Past Medical History:  Diagnosis Date  . Anemia, unspecified   . Anxiety state, unspecified   . Blood dyscrasia    RV factor def  . Cancer ([place])    skin  . Degeneration of cervical intervertebral disc   . Disorder of bone and cartilage, unspecified   . Diverticulosis of colon (without mention of hemorrhage) 01/13/1999  . Esophageal reflux   . Esophageal stricture   . Factor V deficiency ([place])   . Factor V deficiency ([place])   . Hiatal hernia 04/09/2010   ? Paraesophageal component on chest CT 2006  .  HTN (hypertension)   . Irritable bowel syndrome   . Neurogenic bladder   . Osteoarthrosis, unspecified whether generalized or localized, unspecified site   . Peripheral vascular disease (HCC)    pe, dvt hx  . Personal history of thrombophlebitis   . Personal history of venous thrombosis and embolism   . 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 Pure hypercholesterolemia   . Raynaud phenomenon   . Shortness of breath dyspnea   . Unspecified  asthma(493.90)   . Unspecified disorder resulting from impaired renal function    cysts   Past Surgical History:  Procedure Laterality Date  . CHOLECYSTECTOMY    . COLONOSCOPY  2000   Nancy Hicks: moderate to severe sigmoid diverticulosis  . COLONOSCOPY  03/2015   Nancy Hicks at [facility]: TCS for abnormal sigmoid colon on PET. Moderate diverticulosis of the sigmoid colon, sessile 8-9 mm sigmoid: Polyp biopsied (given anticoagulation), two 3 mm ICV polyps removed. All polyps were tubular adenomas.  . ESOPHAGEAL MANOMETRY  10/26/2011   Procedure: ESOPHAGEAL MANOMETRY (EM);  Surgeon: Nancy Feil, MD;  Location: [facility];  Service: Endoscopy;  Laterality: N/A;  . ESOPHAGOGASTRODUODENOSCOPY  03/2010   Nancy Hicks: candida esophagitis, prolapsing hh with cameron lesions, multiple polyps  . EYE SURGERY Bilateral    cataracts  . Flexible sigmoidoscopy  05/2015   Nancy Hicks at [facility]: Performed off anticoagulation. 1-1.5 cm sessile sigmoid polyp removed, site tattooed. Pathology revealed tubular adenoma. Colonoscopy in 3-5 years as medically appropriate.  Marland Kitchen PAROTIDECTOMY Left 11/14/2014  . PAROTIDECTOMY Left 11/14/2014   Procedure: LEFT LATERAL PAROTIDECTOMY;  Surgeon: Nancy Baptist, MD;  Location: [facility];  Service: ENT;  Laterality: Left;  . 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 TONSILLECTOMY       SOCIAL HISTORY:  Social History   Social History  . Marital status: Widowed    Spouse name: N/A  . Number of children: 2  . Years of education: N/A   Occupational History  . retired   .  Retired   Social History Main Topics  . Smoking status: Never Smoker  . Smokeless tobacco: Never Used  . Alcohol use No  . Drug use: No  . Sexual activity: Not on file   Other Topics Concern  . Not on file   Social History Narrative  . No narrative on file    FAMILY HISTORY:  Family History  Problem Relation Age of Onset  . Parkinsonism Mother   . COPD Brother   .  Asthma Brother   . Lung cancer Brother   . Heart attack Brother   . Diabetes Paternal Aunt     CURRENT MEDICATIONS:  Outpatient Encounter Prescriptions as of 01/12/2017  Medication Sig  . ADVAIR DISKUS 100-50 MCG/DOSE AEPB take 1 tablet twice a day (Patient taking differently: 1 puff qd qam)  . allopurinol (ZYLOPRIM) 300 MG tablet Take 300 mg by mouth daily.    . Biotin 5000 MCG CAPS Take 1 capsule by mouth daily.  . Calcium Citrate-Vitamin D (CITRACAL MAXIMUM) 315-250 MG-UNIT TABS Take 1 capsule by mouth daily.  . chlorthalidone (HYGROTON) 25 MG tablet Take 12.5 mg by mouth daily.  . Cholecalciferol (VITAMIN D3) 2000 UNITS TABS Take 1 tablet by mouth daily.    Marland Kitchen DILT-XR 240 MG 24 hr capsule Take 240 mg by mouth daily.  Marland Kitchen diltiazem (CARDIZEM CD) 240 MG 24 hr capsule take 1 capsule by mouth once daily  .  esomeprazole (NEXIUM) 40 MG capsule Take 40 mg by mouth daily before breakfast.   . fluticasone (FLONASE) 50 MCG/ACT nasal spray instill 2 sprays into each nostril once daily (Patient taking differently: instill 2 sprays into each nostril once daily prn)  . furosemide (LASIX) 20 MG tablet Take 1 tablet (20 mg total) by mouth daily.  Marland Kitchen losartan (COZAAR) 100 MG tablet take 1 tablet by mouth once daily  . methenamine (HIPREX) 1 G tablet Take 500 mg by mouth 2 (two) times daily with a meal.   . methylcellulose (CITRUCEL) oral powder Take 1 tsp by mouth daily   . Multiple Vitamin (MULITIVITAMIN WITH MINERALS) TABS Take 1 tablet by mouth daily.  Vladimir Faster Glycol-Propyl Glycol (SYSTANE ULTRA PF) 0.4-0.3 % SOLN Place 2 drops into both eyes 3 (three) times daily.   . potassium chloride SA (K-DUR,KLOR-CON) 20 MEQ tablet take 1 tablet by mouth once daily  . sertraline (ZOLOFT) 50 MG tablet Take 1 tablet (50 mg total) by mouth daily.  . simvastatin (ZOCOR) 10 MG tablet Take 10 mg by mouth at bedtime.    . VENTOLIN HFA 108 (90 BASE) MCG/ACT inhaler inhale 2 puffs by mouth every 6 hours AS NEEDED FOR  WHEEZING  . warfarin (COUMADIN) 2.5 MG tablet Take 2.5 mg by mouth daily. Takes 2.5 mg everyday.   No facility-administered encounter medications on file as of 01/12/2017.     ALLERGIES:  Allergies  Allergen Reactions  . Metoclopramide Hcl Other (See Comments)    REACTION: causes uncontrolable leg movements  . Nitrofurantoin Nausea And Vomiting and Other (See Comments)    REACTION: dizziness     PHYSICAL EXAM:  ECOG Performance status: 1 - Symptomatic; remains independent   Vitals:   01/12/17 1132  BP: (!) 156/77  Pulse: 82  Resp: 16  SpO2: 100%   Filed Weights   01/12/17 1132  Weight: 135 lb 8 oz (61.5 kg)    Physical Exam  Constitutional: She is oriented to person, place, and time and well-developed, well-nourished, and in no distress.  HENT:  Head: Normocephalic.  Mouth/Throat: Oropharynx is clear and moist. No oropharyngeal exudate.  Eyes: Pupils are equal, round, and reactive to light. Conjunctivae are normal. No scleral icterus.  Neck: Normal range of motion.  Small submandibular mass, measuring ~1 cm palpable. Also, (L) cervical level 3 fullness, but no discrete mass appreciated.  No (R) cervical adenopathy.   Cardiovascular: Normal rate and regular rhythm.   Pulmonary/Chest: Effort normal and breath sounds normal. No respiratory distress.  Abdominal: Soft. Bowel sounds are normal. There is no tenderness.  Musculoskeletal: Normal range of motion. She exhibits no edema.  Ambulates with cane.  -Guarded assistance onto and off of exam table.   Lymphadenopathy:    She has no axillary adenopathy.       Right: No supraclavicular adenopathy present.       Left: No supraclavicular adenopathy present.  Neurological: She is alert and oriented to person, place, and time. No cranial nerve deficit.  Skin: Skin is warm and dry. No rash noted.  Psychiatric: Mood, memory, affect and judgment normal.  Nursing note and vitals reviewed.    LABORATORY DATA:  I have  reviewed the labs as listed.  CBC    Component Value Date/Time   WBC 5.4 01/12/2017 1002   RBC 3.94 01/12/2017 1002   HGB 12.5 01/12/2017 1002   HCT 38.4 01/12/2017 1002   PLT 190 01/12/2017 1002   MCV 97.5 01/12/2017 1002  MCH 31.7 01/12/2017 1002   MCHC 32.6 01/12/2017 1002   RDW 14.9 01/12/2017 1002   LYMPHSABS 1.2 01/12/2017 1002   MONOABS 0.5 01/12/2017 1002   EOSABS 0.1 01/12/2017 1002   BASOSABS 0.0 01/12/2017 1002   CMP Latest Ref Rng & Units 01/12/2017 11/03/2016 07/13/2016  Glucose 65 - 99 mg/dL 114(H) - 117(H)  BUN 6 - 20 mg/dL 29(H) - 41(H)  Creatinine 0.44 - 1.00 mg/dL 1.14(H) 1.20(H) 1.08(H)  Sodium 135 - 145 mmol/L 139 - 135  Potassium 3.5 - 5.1 mmol/L 4.8 - 4.2  Chloride 101 - 111 mmol/L 103 - 102  CO2 22 - 32 mmol/L 26 - 26  Calcium 8.9 - 10.3 mg/dL 9.6 - 9.5  Total Protein 6.5 - 8.1 g/dL 6.8 - 6.8  Total Bilirubin 0.3 - 1.2 mg/dL 0.8 - 0.4  Alkaline Phos 38 - 126 U/L 87 - 84  AST 15 - 41 U/L 24 - 25  ALT 14 - 54 U/L 14 - 17    PENDING LABS:    DIAGNOSTIC IMAGING:  *The following radiologic images and reports have been reviewed independently and agree with below findings.  CT neck: 11/03/16 CLINICAL DATA:  83 y/o female with left neck mass discovered 1-2 weeks ago. Personal history of non-Hodgkins B cell Hicks diagnosed on surgical biopsy of left parotid gland mass in 2016.  EXAM: CT NECK WITH CONTRAST  TECHNIQUE: Multidetector CT imaging of the neck was performed using the standard protocol following the bolus administration of intravenous contrast.  CONTRAST:  29mL ISOVUE-300 IOPAMIDOL (ISOVUE-300) INJECTION 61%  COMPARISON:  PET-CT 12/21/2014.  Neck CT 09/05/2014.  FINDINGS: Pharynx and larynx: The glottis is closed. Laryngeal and pharyngeal soft tissue contours are within normal limits. Negative parapharyngeal and retropharyngeal spaces.  Salivary glands: Palpable area of concern marked along the midportion of the left parotid  gland on series 2, image 29. Just caudal to the skin marker along the superficial inferior aspect of the left parotid space there is an 11 mm diameter heterogeneous soft tissue nodule/mass which is new since 2016, although occurs at the site of what may have been a small/ physiologic intra parotid node on the prior neck CT (series 2, image 41 of that study. The central aspect of this lesion appears cystic or necrotic (coronal image 49). No surrounding parotid gland inflammation is evident, but the superior aspect of the left parotid gland does appear somewhat more nodular and heterogeneous than that in 2016 (sagittal image 83).  Fatty atrophied submandibular glands as in 2016. Negative sublingual space. Smaller right parotid gland appears stable since 2016 and within normal limits.  Thyroid: Stable and within normal limits for age.  Lymph nodes: Outside of the left parotid findings, no cervical lymphadenopathy. Subcentimeter bilateral cervical lymph nodes appear stable to the 2016 CT and normal by CT criteria.  Vascular: Major vascular structures in the neck and at the skullbase are patent. Carotid and vertebral calcified atherosclerosis.  Limited intracranial: Negative.  Visualized orbits: Negative.  Mastoids and visualized paranasal sinuses: Visualized paranasal sinuses and mastoids are stable and well pneumatized.  Skeleton: Chronic left TMJ degeneration. Cervical spine degeneration. No acute or suspicious osseous lesion identified.  Upper chest: Stable lung parenchyma compared to the 2016 PET-CT. No superior mediastinal lymphadenopathy. Aortic arch and proximal great vessel tortuosity.  IMPRESSION: 1. Palpable abnormality appears to correspond to an 11 mm partially cystic or necrotic round mass in the inferior left parotid space. Top differential considerations in this clinical setting include recurrent  Hicks and metastatic nodal disease from regional  skin cancer. 2. Some generalized increased nodularity of the left parotid gland parenchyma compared to 2016, but no other discrete mass. 3. No superimposed lymphadenopathy at the other neck lymph node stations or in the visible upper chest.   Electronically Signed   By: Genevie Ann M.D.   On: 11/03/2016 17:24     PATHOLOGY:  (L) parotid gland mass pathology: 11/14/14           ASSESSMENT & PLAN:   Nancy Hicks of (L) parotid:  -Diagnosed in 2016. Treated with (L) lateral parotidectomy with facial nerve dissection on 11/14/14. Post-op PET scan revealed no residual disease in the neck.  -CT neck images reviewed with patient in detail today. New (L) neck mass; biopsy pending with Nancy Hicks on 01/15/17 per patient.  Based on pathology results, we are happy to see the patient back sooner than scheduled to discuss additional treatment options if malignancy confirmed.  She agrees with this plan.  Shared with patient that I will send a copy of this office visit note to Nancy Hicks for continuity of care.   -Labs collected today (CBC with diff & CMET) reviewed with patient in detail as well and are largely stable.   -For now, plan is to have patient return to cancer center in 6 months for continued follow-up.  Certainly, based on the pathology of upcoming (L) neck biopsy, we are happy to see her sooner if needed.    Health maintenance/Wellness:  -She is reportedly up-to-date with her age/gender appropriate cancer screenings including mammogram and colonoscopy.  She has not gotten her flu shot yet; plans to do so with PCP soon.   -Recommended continued follow-up with PCP as directed.       Dispo:  -Currently schedule to return to cancer center in 6 months with labs. However, if upcoming (L) neck biopsy proves to be malignant and additional medical oncology treatment recommendations are needed, we are happy to see the patient sooner per Nancy Hicks.   -I will share this note with Dr.  Benjamine Hicks for continuity of care. He is welcome to contact our office with any questions/concerns as well.    All questions were answered to patient's stated satisfaction. Encouraged patient to call with any new concerns or questions before her next visit to the cancer center and we can certain see her sooner, if needed.    Plan of care discussed with Dr. Talbert Cage, who agrees with the above aforementioned.    Orders placed this encounter:  Orders Placed This Encounter  Procedures  . CBC with Differential/Platelet  . Comprehensive metabolic panel  . Lactate dehydrogenase      Mike Craze, NP [place] [phone]

## 2017-01-12 NOTE — Patient Instructions (Signed)
[facility] Discharge Instructions  RECOMMENDATIONS MADE BY THE CONSULTANT AND ANY TEST RESULTS WILL BE SENT TO YOUR REFERRING PHYSICIAN.  You were seen today by Gretchen Dawson NP. Return in 6 months for labs and follow up.  Thank you for choosing [facility] at [facility] to provide your oncology and hematology care.  To afford each patient quality time with our provider, please arrive at least 15 minutes before your scheduled appointment time.    If you have a lab appointment with the Cancer Center please come in thru the  Main Entrance and check in at the main information desk  You need to re-schedule your appointment should you arrive 10 or more minutes late.  We strive to give you quality time with our providers, and arriving late affects you and other patients whose appointments are after yours.  Also, if you no show three or more times for appointments you may be dismissed from the clinic at the providers discretion.     Again, thank you for choosing [facility].  Our hope is that these requests will decrease the amount of time that you wait before being seen by our physicians.       _____________________________________________________________  Should you have questions after your visit to [facility], please contact our office at [phone] between the hours of 8:30 a.m. and 4:30 p.m.  Voicemails left after 4:30 p.m. will not be returned until the following business day.  For prescription refill requests, have your pharmacy contact our office.       Resources For Cancer Patients and their Caregivers ? American Cancer Society: Can assist with transportation, wigs, general needs, runs Look Good Feel Better.        1-888-227-6333 ? Cancer Care: Provides financial assistance, online support groups, medication/co-pay assistance.  1-800-813-HOPE (4673) ? [facility] Assists [place] cancer patients and their families through emotional , educational and financial support.  336-427-4357 ? [place] DSS Where to apply for food stamps, Medicaid and utility assistance. 336-342-1394 ? [facility]: Transportation to medical appointments. 336-347-2287 ? Social Security Administration: May apply for disability if have a Stage IV cancer. 336-342-7796 1-800-772-1213 ? [place] Aging, Disability and Transit Services: Assists with nutrition, care and transit needs. 336-349-2343  Cancer Center Support Programs: @10RELATIVEDAYS@ > Cancer Support Group  2nd Tuesday of the month 1pm-2pm, Journey Room  > Creative Journey  3rd Tuesday of the month 1130am-1pm, Journey Room  > Look Good Feel Better  1st Wednesday of the month 10am-12 noon, Journey Room (Call American Cancer Society to register 1-800-395-5775)    

## 2017-01-14 ENCOUNTER — Other Ambulatory Visit: Payer: Self-pay | Admitting: General Surgery

## 2017-01-15 ENCOUNTER — Ambulatory Visit (HOSPITAL_COMMUNITY)
Admission: RE | Admit: 2017-01-15 | Discharge: 2017-01-15 | Disposition: A | Discharge status: Home / Self Care | Payer: Medicare Other | Source: Ambulatory Visit | Attending: Otolaryngology | Admitting: Otolaryngology

## 2017-01-15 ENCOUNTER — Other Ambulatory Visit (INDEPENDENT_AMBULATORY_CARE_PROVIDER_SITE_OTHER): Payer: Self-pay | Admitting: Otolaryngology

## 2017-01-15 DIAGNOSIS — Z801 Family history of malignant neoplasm of trachea, bronchus and lung: Secondary | ICD-10-CM | POA: Insufficient documentation

## 2017-01-15 DIAGNOSIS — H269 Unspecified cataract: Secondary | ICD-10-CM | POA: Diagnosis not present

## 2017-01-15 DIAGNOSIS — D682 Hereditary deficiency of other clotting factors: Secondary | ICD-10-CM | POA: Insufficient documentation

## 2017-01-15 DIAGNOSIS — M1990 Unspecified osteoarthritis, unspecified site: Secondary | ICD-10-CM | POA: Insufficient documentation

## 2017-01-15 DIAGNOSIS — K219 Gastro-esophageal reflux disease without esophagitis: Secondary | ICD-10-CM | POA: Diagnosis not present

## 2017-01-15 DIAGNOSIS — Z86010 Personal history of colonic polyps: Secondary | ICD-10-CM | POA: Insufficient documentation

## 2017-01-15 DIAGNOSIS — Z7901 Long term (current) use of anticoagulants: Secondary | ICD-10-CM | POA: Diagnosis not present

## 2017-01-15 DIAGNOSIS — K589 Irritable bowel syndrome without diarrhea: Secondary | ICD-10-CM | POA: Insufficient documentation

## 2017-01-15 DIAGNOSIS — Z888 Allergy status to other drugs, medicaments and biological substances status: Secondary | ICD-10-CM | POA: Diagnosis not present

## 2017-01-15 DIAGNOSIS — F419 Anxiety disorder, unspecified: Secondary | ICD-10-CM | POA: Diagnosis not present

## 2017-01-15 DIAGNOSIS — Z9889 Other specified postprocedural states: Secondary | ICD-10-CM | POA: Insufficient documentation

## 2017-01-15 DIAGNOSIS — I7300 Raynaud's syndrome without gangrene: Secondary | ICD-10-CM | POA: Diagnosis not present

## 2017-01-15 DIAGNOSIS — N319 Neuromuscular dysfunction of bladder, unspecified: Secondary | ICD-10-CM | POA: Diagnosis not present

## 2017-01-15 DIAGNOSIS — E7800 Pure hypercholesterolemia, unspecified: Secondary | ICD-10-CM | POA: Insufficient documentation

## 2017-01-15 DIAGNOSIS — K119 Disease of salivary gland, unspecified: Secondary | ICD-10-CM | POA: Insufficient documentation

## 2017-01-15 DIAGNOSIS — Z9049 Acquired absence of other specified parts of digestive tract: Secondary | ICD-10-CM | POA: Diagnosis not present

## 2017-01-15 DIAGNOSIS — Z820 Family history of epilepsy and other diseases of the nervous system: Secondary | ICD-10-CM | POA: Diagnosis not present

## 2017-01-15 DIAGNOSIS — Z825 Family history of asthma and other chronic lower respiratory diseases: Secondary | ICD-10-CM | POA: Diagnosis not present

## 2017-01-15 DIAGNOSIS — Z85828 Personal history of other malignant neoplasm of skin: Secondary | ICD-10-CM | POA: Insufficient documentation

## 2017-01-15 DIAGNOSIS — I10 Essential (primary) hypertension: Secondary | ICD-10-CM | POA: Insufficient documentation

## 2017-01-15 DIAGNOSIS — Z833 Family history of diabetes mellitus: Secondary | ICD-10-CM | POA: Diagnosis not present

## 2017-01-15 DIAGNOSIS — Z86718 Personal history of other venous thrombosis and embolism: Secondary | ICD-10-CM | POA: Diagnosis not present

## 2017-01-15 DIAGNOSIS — K118 Other diseases of salivary glands: Secondary | ICD-10-CM

## 2017-01-15 DIAGNOSIS — J45909 Unspecified asthma, uncomplicated: Secondary | ICD-10-CM | POA: Diagnosis not present

## 2017-01-15 DIAGNOSIS — Z79899 Other long term (current) drug therapy: Secondary | ICD-10-CM | POA: Insufficient documentation

## 2017-01-15 LAB — CBC
HCT: 37.8 % (ref 36.0–46.0)
HEMOGLOBIN: 12.2 g/dL (ref 12.0–15.0)
MCH: 30.9 pg (ref 26.0–34.0)
MCHC: 32.3 g/dL (ref 30.0–36.0)
MCV: 95.7 fL (ref 78.0–100.0)
PLATELETS: 192 10*3/uL (ref 150–400)
RBC: 3.95 MIL/uL (ref 3.87–5.11)
RDW: 14.7 % (ref 11.5–15.5)
WBC: 4.4 10*3/uL (ref 4.0–10.5)

## 2017-01-15 LAB — PROTIME-INR
INR: 1.08
PROTHROMBIN TIME: 13.9 s (ref 11.4–15.2)

## 2017-01-15 MED ORDER — LIDOCAINE HCL (PF) 1 % IJ SOLN
INTRAMUSCULAR | Status: AC
  Filled 2017-01-15: qty 30

## 2017-01-15 MED ORDER — FENTANYL CITRATE (PF) 100 MCG/2ML IJ SOLN
INTRAMUSCULAR | Status: AC
  Filled 2017-01-15: qty 4

## 2017-01-15 MED ORDER — NALOXONE HCL 0.4 MG/ML IJ SOLN
INTRAMUSCULAR | Status: AC
  Filled 2017-01-15: qty 1

## 2017-01-15 MED ORDER — FLUMAZENIL 0.5 MG/5ML IV SOLN
INTRAVENOUS | Status: AC
  Filled 2017-01-15: qty 5

## 2017-01-15 MED ORDER — SODIUM CHLORIDE 0.9 % IV SOLN: INTRAVENOUS | Status: DC

## 2017-01-15 MED ORDER — MIDAZOLAM HCL 2 MG/2ML IJ SOLN
INTRAMUSCULAR | Status: AC
  Filled 2017-01-15: qty 4

## 2017-01-15 NOTE — H&P (Signed)
Chief Complaint: Patient was seen in consultation today for parotid mass  Referring Physician(s): Teoh,Su  Supervising Physician: Daryll Brod  Patient Status: [facility] - Out-pt  History of Present Illness: Nancy Hicks is a 83 y.o. female with past medical history of anxiety, GERD, Factor V deficiency, HTN, and parotid mass s/p lateral parotidectomy in 2016 who noted recurrence of swelling in the left neck. She was seen by Dr. Benjamine Mola who sent her for further imaging.   CT Soft Tissue Neck 11/03/16 showed: 1. Palpable abnormality appears to correspond to an 11 mm partially cystic or necrotic round mass in the inferior left parotid space. Top differential considerations in this clinical setting include recurrent lymphoma and metastatic nodal disease from regional skin cancer. 2. Some generalized increased nodularity of the left parotid gland parenchyma compared to 2016, but no other discrete mass. 3. No superimposed lymphadenopathy at the other neck lymph node stations or in the visible upper chest.  Patient deferred surgery, instead requesting biopsy.  IR consulted for left parotid mass biopsy at the request of Dr. Benjamine Mola.   She presents for procedure today in her usual state of health.  She has been NPO.  She does take coumadin- last took Sunday before transitioning to Lovenox.    Past Medical History:  Diagnosis Date  . Anemia, unspecified   . Anxiety state, unspecified   . Blood dyscrasia    RV factor def  . Cancer ([place])    skin  . Degeneration of cervical intervertebral disc   . Disorder of bone and cartilage, unspecified   . Diverticulosis of colon (without mention of hemorrhage) 01/13/1999  . Esophageal reflux   . Esophageal stricture   . Factor V deficiency ([place])   . Factor V deficiency ([place])   . Hiatal hernia 04/09/2010   ? Paraesophageal component on chest CT 2006  . HTN (hypertension)   . Irritable bowel syndrome   . Neurogenic bladder   .  Osteoarthrosis, unspecified whether generalized or localized, unspecified site   . Peripheral vascular disease (HCC)    pe, dvt hx  . Personal history of thrombophlebitis   . Personal history of venous thrombosis and embolism   . 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 Pure hypercholesterolemia   . Raynaud phenomenon   . Shortness of breath dyspnea   . Unspecified asthma(493.90)   . Unspecified disorder resulting from impaired renal function    cysts    Past Surgical History:  Procedure Laterality Date  . CHOLECYSTECTOMY    . COLONOSCOPY  2000   Dr. Verl Blalock: moderate to severe sigmoid diverticulosis  . COLONOSCOPY  03/2015   Dr. Peggye Form at [facility]: TCS for abnormal sigmoid colon on PET. Moderate diverticulosis of the sigmoid colon, sessile 8-9 mm sigmoid: Polyp biopsied (given anticoagulation), two 3 mm ICV polyps removed. All polyps were tubular adenomas.  . ESOPHAGEAL MANOMETRY  10/26/2011   Procedure: ESOPHAGEAL MANOMETRY (EM);  Surgeon: Sable Feil, MD;  Location: [facility];  Service: Endoscopy;  Laterality: N/A;  . ESOPHAGOGASTRODUODENOSCOPY  03/2010   Dr. Verl Blalock: candida esophagitis, prolapsing hh with cameron lesions, multiple polyps  . EYE SURGERY Bilateral    cataracts  . Flexible sigmoidoscopy  05/2015   Dr. Peggye Form at [facility]: Performed off anticoagulation. 1-1.5 cm sessile sigmoid polyp removed, site tattooed. Pathology revealed tubular adenoma. Colonoscopy in 3-5 years as medically appropriate.  Marland Kitchen PAROTIDECTOMY Left 11/14/2014  . PAROTIDECTOMY Left 11/14/2014   Procedure: LEFT LATERAL PAROTIDECTOMY;  Surgeon: Leta Baptist, MD;  Location: [place];  Service: ENT;  Laterality: Left;  . 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 TONSILLECTOMY      Allergies: Metoclopramide hcl and Nitrofurantoin  Medications: Prior to Admission medications   Medication Sig Start Date End Date Taking? Authorizing Provider  ADVAIR DISKUS 100-50 MCG/DOSE AEPB  take 1 tablet twice a day Patient taking differently: 1 puff qd qam 01/01/17  Yes Noralee Space, MD  allopurinol (ZYLOPRIM) 300 MG tablet Take 300 mg by mouth daily.     Yes [provider]  Biotin 5000 MCG CAPS Take 1 capsule by mouth daily.   Yes [provider]  Calcium Citrate-Vitamin D (CITRACAL MAXIMUM) 315-250 MG-UNIT TABS Take 1 capsule by mouth daily.   Yes [provider]  chlorthalidone (HYGROTON) 25 MG tablet Take 12.5 mg by mouth daily.   Yes [provider]  Cholecalciferol (VITAMIN D3) 2000 UNITS TABS Take 1 tablet by mouth daily.     Yes [provider]  DILT-XR 240 MG 24 hr capsule Take 240 mg by mouth daily. 10/20/16  Yes [provider]  esomeprazole (NEXIUM) 40 MG capsule Take 40 mg by mouth daily before breakfast.  06/09/11  Yes Sable Feil, MD  fluticasone [facility]) 50 MCG/ACT nasal spray instill 2 sprays into each nostril once daily Patient taking differently: instill 2 sprays into each nostril once daily prn 03/03/16  Yes Noralee Space, MD  furosemide (LASIX) 20 MG tablet Take 1 tablet (20 mg total) by mouth daily. 07/19/13  Yes Noralee Space, MD  losartan (COZAAR) 100 MG tablet take 1 tablet by mouth once daily   Yes Noralee Space, MD  methenamine (HIPREX) 1 G tablet Take 500 mg by mouth 2 (two) times daily with a meal.  04/11/11  Yes [provider]  methylcellulose (CITRUCEL) oral powder Take 1 tsp by mouth daily    Yes [provider]  Multiple Vitamin (MULITIVITAMIN WITH MINERALS) TABS Take 1 tablet by mouth daily.   Yes [provider]  Polyethyl Glycol-Propyl Glycol (SYSTANE ULTRA PF) 0.4-0.3 % SOLN Place 2 drops into both eyes 3 (three) times daily.    Yes [provider]  potassium chloride SA (K-DUR,KLOR-CON) 20 MEQ tablet take 1 tablet by mouth once daily   Yes Noralee Space, MD  sertraline (ZOLOFT) 50 MG tablet Take 1 tablet (50 mg total) by mouth daily. 05/29/13  Yes  Noralee Space, MD  simvastatin (ZOCOR) 10 MG tablet Take 10 mg by mouth at bedtime.     Yes [provider]  VENTOLIN HFA 108 (90 BASE) MCG/ACT inhaler inhale 2 puffs by mouth every 6 hours AS NEEDED FOR WHEEZING 11/09/14  Yes Noralee Space, MD  warfarin (COUMADIN) 2.5 MG tablet Take 2.5 mg by mouth daily. Takes 2.5 mg everyday.   Yes [provider]  diltiazem (CARDIZEM CD) 240 MG 24 hr capsule take 1 capsule by mouth once daily 06/20/14   Noralee Space, MD     Family History  Problem Relation Age of [place]  . Parkinsonism Mother   . COPD Brother   . Asthma Brother   . Lung cancer Brother   . Heart attack Brother   . Diabetes Paternal Aunt     Social History   Social History  . Marital status: Widowed    Spouse name: N/A  . Number of children: 2  . Years of education: N/A  Occupational History  . retired   .  Retired   Social History Main Topics  . Smoking status: Never Smoker  . Smokeless tobacco: Never Used  . Alcohol use No  . Drug use: No  . Sexual activity: Not on file   Other Topics Concern  . Not on file   Social History Narrative  . No narrative on file    Review of Systems  Constitutional: Negative for fatigue and fever.  Respiratory: Negative for cough and shortness of breath.   Cardiovascular: Negative for chest pain.  Gastrointestinal: Negative for abdominal pain.  Musculoskeletal: Negative for back pain.  Psychiatric/Behavioral: Negative for confusion.    Vital Signs: BP (!) 154/53   Pulse 65   Temp 98.4 F (36.9 C)   Resp 18   Ht 5' (1.524 m)   Wt 130 lb (59 kg)   SpO2 98%   BMI 25.39 kg/m   Physical Exam  Constitutional: She is oriented to person, place, and time. She appears well-developed.  Cardiovascular: Normal rate, regular rhythm and normal heart sounds.   Pulmonary/Chest: Effort normal and breath sounds normal. No respiratory distress.  Abdominal: Soft.  Neurological: She is alert and oriented to person,  place, and time.  Skin: Skin is warm and dry.  Psychiatric: She has a normal mood and affect. Her behavior is normal. Judgment and thought content normal.  Nursing note and vitals reviewed.   Imaging: No results found.  Labs:  CBC:  Recent Labs  07/13/16 1050 01/12/17 1002 01/15/17 1205  WBC 6.2 5.4 4.4  HGB 12.7 12.5 12.2  HCT 38.7 38.4 37.8  PLT 210 190 192    COAGS: No results for input(s): INR, APTT in the last 8760 hours.  BMP:  Recent Labs  07/13/16 1050 11/03/16 1511 01/12/17 1002  NA 135  --  139  K 4.2  --  4.8  CL 102  --  103  CO2 26  --  26  GLUCOSE 117*  --  114*  BUN 41*  --  29*  CALCIUM 9.5  --  9.6  CREATININE 1.08* 1.20* 1.14*  GFRNONAA 44*  --  42*  GFRAA 52*  --  48*    LIVER FUNCTION TESTS:  Recent Labs  07/13/16 1050 01/12/17 1002  BILITOT 0.4 0.8  AST 25 24  ALT 17 14  ALKPHOS 84 87  PROT 6.8 6.8  ALBUMIN 3.8 3.9    TUMOR MARKERS: No results for input(s): AFPTM, CEA, CA199, CHROMGRNA in the last 8760 hours.  Assessment and Plan: Patient with past medical history of parotid mass s/p lateral parotidectomy in 2016 presents with return of swelling in the neck.  IR consulted for parotid mass biopsy at the request of Dr. Benjamine Mola. Case reviewed by Dr. Anselm Pancoast who approves patient for procedure.  Patient presents today in their usual state of health.  She has been NPO and transitioned to lovenox for procedure. Her last dose was last PM.  Discussed with Dr. Annamaria Boots who is agreeable to proceed.  Risks and benefits discussed with the patient including, but not limited to bleeding, infection, damage to adjacent structures or low yield requiring additional tests. All of the patient's questions were answered, patient is agreeable to proceed. Consent signed and in chart.  Thank you for this interesting consult.  I greatly enjoyed meeting Nancy Hicks and look forward to participating in their care.  A copy of this report was sent to the  requesting provider on this date.  Electronically Signed: Docia Barrier, PA 01/15/2017, 12:42 PM   I spent a total of  30 Minutes   in face to face in clinical consultation, greater than 50% of which was counseling/coordinating care for parotid mass

## 2017-01-15 NOTE — Sedation Documentation (Signed)
Procedure cancelled. MD unable to locate area of interest. Ordering MD notified. Patient will be discharged from Interventional Radiology. Pt received no sedation.

## 2017-01-15 NOTE — Discharge Instructions (Signed)

## 2017-01-15 NOTE — Procedures (Signed)
[place] today of the left parotid shows no corresponding 11 lymph node or nodule.  Therefore bx not performed  D/w Dr Benjamine Mola

## 2017-04-14 ENCOUNTER — Other Ambulatory Visit: Payer: Self-pay | Admitting: Pulmonary Disease

## 2017-06-24 ENCOUNTER — Observation Stay (HOSPITAL_COMMUNITY)
Admission: EM | Admit: 2017-06-24 | Discharge: 2017-06-28 | Disposition: A | Discharge status: Home / Self Care | Payer: Medicare Other | Attending: Internal Medicine | Admitting: Internal Medicine

## 2017-06-24 ENCOUNTER — Emergency Department (HOSPITAL_COMMUNITY): Payer: Medicare Other

## 2017-06-24 ENCOUNTER — Other Ambulatory Visit: Payer: Self-pay

## 2017-06-24 ENCOUNTER — Encounter (HOSPITAL_COMMUNITY): Payer: Self-pay | Admitting: Emergency Medicine

## 2017-06-24 DIAGNOSIS — Z85828 Personal history of other malignant neoplasm of skin: Secondary | ICD-10-CM | POA: Diagnosis not present

## 2017-06-24 DIAGNOSIS — M549 Dorsalgia, unspecified: Secondary | ICD-10-CM

## 2017-06-24 DIAGNOSIS — N189 Chronic kidney disease, unspecified: Secondary | ICD-10-CM | POA: Diagnosis present

## 2017-06-24 DIAGNOSIS — Z79899 Other long term (current) drug therapy: Secondary | ICD-10-CM | POA: Diagnosis not present

## 2017-06-24 DIAGNOSIS — K219 Gastro-esophageal reflux disease without esophagitis: Secondary | ICD-10-CM | POA: Diagnosis present

## 2017-06-24 DIAGNOSIS — I129 Hypertensive chronic kidney disease with stage 1 through stage 4 chronic kidney disease, or unspecified chronic kidney disease: Secondary | ICD-10-CM | POA: Insufficient documentation

## 2017-06-24 DIAGNOSIS — J45909 Unspecified asthma, uncomplicated: Secondary | ICD-10-CM | POA: Insufficient documentation

## 2017-06-24 DIAGNOSIS — R51 Headache: Secondary | ICD-10-CM | POA: Diagnosis not present

## 2017-06-24 DIAGNOSIS — Y92012 Bathroom of single-family (private) house as the place of occurrence of the external cause: Secondary | ICD-10-CM | POA: Insufficient documentation

## 2017-06-24 DIAGNOSIS — Y998 Other external cause status: Secondary | ICD-10-CM | POA: Insufficient documentation

## 2017-06-24 DIAGNOSIS — J449 Chronic obstructive pulmonary disease, unspecified: Secondary | ICD-10-CM | POA: Diagnosis not present

## 2017-06-24 DIAGNOSIS — I10 Essential (primary) hypertension: Secondary | ICD-10-CM | POA: Diagnosis present

## 2017-06-24 DIAGNOSIS — S2242XA Multiple fractures of ribs, left side, initial encounter for closed fracture: Secondary | ICD-10-CM | POA: Diagnosis not present

## 2017-06-24 DIAGNOSIS — S3992XA Unspecified injury of lower back, initial encounter: Secondary | ICD-10-CM | POA: Diagnosis present

## 2017-06-24 DIAGNOSIS — S2232XA Fracture of one rib, left side, initial encounter for closed fracture: Secondary | ICD-10-CM

## 2017-06-24 DIAGNOSIS — W01198A Fall on same level from slipping, tripping and stumbling with subsequent striking against other object, initial encounter: Secondary | ICD-10-CM | POA: Diagnosis not present

## 2017-06-24 DIAGNOSIS — S2239XA Fracture of one rib, unspecified side, initial encounter for closed fracture: Secondary | ICD-10-CM | POA: Diagnosis present

## 2017-06-24 DIAGNOSIS — Z7901 Long term (current) use of anticoagulants: Secondary | ICD-10-CM | POA: Diagnosis not present

## 2017-06-24 DIAGNOSIS — S2249XA Multiple fractures of ribs, unspecified side, initial encounter for closed fracture: Secondary | ICD-10-CM | POA: Diagnosis present

## 2017-06-24 DIAGNOSIS — Y93E1 Activity, personal bathing and showering: Secondary | ICD-10-CM | POA: Diagnosis not present

## 2017-06-24 DIAGNOSIS — Z86711 Personal history of pulmonary embolism: Secondary | ICD-10-CM | POA: Diagnosis present

## 2017-06-24 DIAGNOSIS — D682 Hereditary deficiency of other clotting factors: Secondary | ICD-10-CM | POA: Diagnosis present

## 2017-06-24 LAB — CBC WITH DIFFERENTIAL/PLATELET
Basophils Absolute: 0 10*3/uL (ref 0.0–0.1)
Basophils Relative: 0 %
EOS ABS: 0.1 10*3/uL (ref 0.0–0.7)
EOS PCT: 1 %
HCT: 39.1 % (ref 36.0–46.0)
Hemoglobin: 12.6 g/dL (ref 12.0–15.0)
LYMPHS ABS: 1.3 10*3/uL (ref 0.7–4.0)
Lymphocytes Relative: 21 %
MCH: 31 pg (ref 26.0–34.0)
MCHC: 32.2 g/dL (ref 30.0–36.0)
MCV: 96.3 fL (ref 78.0–100.0)
MONOS PCT: 9 %
Monocytes Absolute: 0.5 10*3/uL (ref 0.1–1.0)
Neutro Abs: 4.1 10*3/uL (ref 1.7–7.7)
Neutrophils Relative %: 69 %
PLATELETS: 196 10*3/uL (ref 150–400)
RBC: 4.06 MIL/uL (ref 3.87–5.11)
RDW: 14.8 % (ref 11.5–15.5)
WBC: 5.9 10*3/uL (ref 4.0–10.5)

## 2017-06-24 LAB — BASIC METABOLIC PANEL
Anion gap: 11 (ref 5–15)
BUN: 33 mg/dL — AB (ref 6–20)
CHLORIDE: 100 mmol/L — AB (ref 101–111)
CO2: 22 mmol/L (ref 22–32)
CREATININE: 1.07 mg/dL — AB (ref 0.44–1.00)
Calcium: 9.6 mg/dL (ref 8.9–10.3)
GFR calc Af Amer: 52 mL/min — ABNORMAL LOW (ref >60)
GFR calc non Af Amer: 45 mL/min — ABNORMAL LOW (ref >60)
Glucose, Bld: 118 mg/dL — ABNORMAL HIGH (ref 65–99)
Potassium: 3.8 mmol/L (ref 3.5–5.1)
Sodium: 133 mmol/L — ABNORMAL LOW (ref 135–145)

## 2017-06-24 LAB — URINALYSIS, ROUTINE W REFLEX MICROSCOPIC
BILIRUBIN URINE: NEGATIVE
GLUCOSE, UA: NEGATIVE mg/dL
Hgb urine dipstick: NEGATIVE
KETONES UR: NEGATIVE mg/dL
Leukocytes, UA: NEGATIVE
Nitrite: NEGATIVE
PH: 6 (ref 5.0–8.0)
Protein, ur: NEGATIVE mg/dL
Specific Gravity, Urine: 1.009 (ref 1.005–1.030)

## 2017-06-24 LAB — PROTIME-INR
INR: 2.6
PROTHROMBIN TIME: 27.6 s — AB (ref 11.4–15.2)

## 2017-06-24 MED ORDER — METHENAMINE HIPPURATE 1 G PO TABS: 500.0000 mg | ORAL_TABLET | Freq: Two times a day (BID) | ORAL | Status: DC

## 2017-06-24 MED ORDER — FLUTICASONE PROPIONATE 50 MCG/ACT NA SUSP
2.0000 | Freq: Every day | NASAL | Status: DC
  Administered 2017-06-25 – 2017-06-28 (×4): 2 via NASAL
  Filled 2017-06-24: qty 16

## 2017-06-24 MED ORDER — KETOROLAC TROMETHAMINE 15 MG/ML IJ SOLN
15.0000 mg | Freq: Four times a day (QID) | INTRAMUSCULAR | Status: DC | PRN
  Administered 2017-06-25: 15 mg via INTRAVENOUS
  Filled 2017-06-24: qty 1

## 2017-06-24 MED ORDER — FUROSEMIDE 40 MG PO TABS
20.0000 mg | ORAL_TABLET | Freq: Every day | ORAL | Status: DC
  Administered 2017-06-25 – 2017-06-28 (×4): 20 mg via ORAL
  Filled 2017-06-24 (×4): qty 1

## 2017-06-24 MED ORDER — SODIUM CHLORIDE 0.9 % IV SOLN: 250.0000 mL | INTRAVENOUS | Status: DC | PRN

## 2017-06-24 MED ORDER — POLYVINYL ALCOHOL 1.4 % OP SOLN
2.0000 [drp] | Freq: Three times a day (TID) | OPHTHALMIC | Status: DC
  Administered 2017-06-25 – 2017-06-28 (×10): 2 [drp] via OPHTHALMIC
  Filled 2017-06-24 (×3): qty 15

## 2017-06-24 MED ORDER — POTASSIUM CHLORIDE CRYS ER 20 MEQ PO TBCR
20.0000 meq | EXTENDED_RELEASE_TABLET | Freq: Every day | ORAL | Status: DC
  Administered 2017-06-25 – 2017-06-28 (×4): 20 meq via ORAL
  Filled 2017-06-24 (×2): qty 1
  Filled 2017-06-24: qty 2
  Filled 2017-06-24: qty 1

## 2017-06-24 MED ORDER — ACETAMINOPHEN 325 MG PO TABS: 650.0000 mg | ORAL_TABLET | Freq: Four times a day (QID) | ORAL | Status: DC | PRN

## 2017-06-24 MED ORDER — ONDANSETRON HCL 4 MG/2ML IJ SOLN: 4.0000 mg | Freq: Four times a day (QID) | INTRAMUSCULAR | Status: DC | PRN

## 2017-06-24 MED ORDER — TIZANIDINE HCL 4 MG PO TABS
2.0000 mg | ORAL_TABLET | Freq: Three times a day (TID) | ORAL | Status: DC | PRN
  Administered 2017-06-25: 2 mg via ORAL
  Filled 2017-06-24 (×2): qty 1

## 2017-06-24 MED ORDER — LOSARTAN POTASSIUM 25 MG PO TABS
100.0000 mg | ORAL_TABLET | Freq: Every day | ORAL | Status: DC
  Administered 2017-06-25 – 2017-06-28 (×4): 100 mg via ORAL
  Filled 2017-06-24 (×4): qty 4

## 2017-06-24 MED ORDER — HYDROCODONE-ACETAMINOPHEN 5-325 MG PO TABS
1.0000 | ORAL_TABLET | ORAL | Status: DC | PRN
  Administered 2017-06-25 – 2017-06-27 (×7): 1 via ORAL
  Filled 2017-06-24 (×7): qty 1

## 2017-06-24 MED ORDER — WARFARIN SODIUM 2.5 MG PO TABS: 2.5000 mg | ORAL_TABLET | Freq: Every evening | ORAL | Status: DC

## 2017-06-24 MED ORDER — SIMVASTATIN 10 MG PO TABS
10.0000 mg | ORAL_TABLET | Freq: Every day | ORAL | Status: DC
  Administered 2017-06-25 – 2017-06-27 (×4): 10 mg via ORAL
  Filled 2017-06-24 (×4): qty 1

## 2017-06-24 MED ORDER — ACETAMINOPHEN 650 MG RE SUPP: 650.0000 mg | Freq: Four times a day (QID) | RECTAL | Status: DC | PRN

## 2017-06-24 MED ORDER — CALCIUM CITRATE-VITAMIN D 500-500 MG-UNIT PO CHEW
1.0000 | CHEWABLE_TABLET | Freq: Every day | ORAL | Status: DC
  Filled 2017-06-24: qty 1

## 2017-06-24 MED ORDER — SERTRALINE HCL 50 MG PO TABS
50.0000 mg | ORAL_TABLET | Freq: Every day | ORAL | Status: DC
  Administered 2017-06-25 – 2017-06-28 (×4): 50 mg via ORAL
  Filled 2017-06-24 (×4): qty 1

## 2017-06-24 MED ORDER — LIDOCAINE 5 % EX PTCH
1.0000 | MEDICATED_PATCH | CUTANEOUS | Status: DC
  Administered 2017-06-25 – 2017-06-27 (×4): 1 via TRANSDERMAL
  Filled 2017-06-24 (×5): qty 1

## 2017-06-24 MED ORDER — PANTOPRAZOLE SODIUM 40 MG PO TBEC
40.0000 mg | DELAYED_RELEASE_TABLET | Freq: Every day | ORAL | Status: DC
  Administered 2017-06-25 – 2017-06-28 (×4): 40 mg via ORAL
  Filled 2017-06-24 (×4): qty 1

## 2017-06-24 MED ORDER — ONDANSETRON HCL 4 MG PO TABS: 4.0000 mg | ORAL_TABLET | Freq: Four times a day (QID) | ORAL | Status: DC | PRN

## 2017-06-24 MED ORDER — VITAMIN D 1000 UNITS PO TABS
2000.0000 [IU] | ORAL_TABLET | Freq: Every day | ORAL | Status: DC
  Administered 2017-06-25 – 2017-06-28 (×4): 2000 [IU] via ORAL
  Filled 2017-06-24 (×4): qty 2

## 2017-06-24 MED ORDER — SODIUM CHLORIDE 0.9% FLUSH
3.0000 mL | Freq: Two times a day (BID) | INTRAVENOUS | Status: DC
  Administered 2017-06-25 – 2017-06-28 (×8): 3 mL via INTRAVENOUS

## 2017-06-24 MED ORDER — ALLOPURINOL 300 MG PO TABS
300.0000 mg | ORAL_TABLET | Freq: Every day | ORAL | Status: DC
  Administered 2017-06-25 – 2017-06-28 (×4): 300 mg via ORAL
  Filled 2017-06-24 (×5): qty 1

## 2017-06-24 MED ORDER — BIOTIN 5000 MCG PO CAPS: 1.0000 | ORAL_CAPSULE | Freq: Every day | ORAL | Status: DC

## 2017-06-24 MED ORDER — ADULT MULTIVITAMIN W/MINERALS CH
1.0000 | ORAL_TABLET | Freq: Every day | ORAL | Status: DC
  Administered 2017-06-25 – 2017-06-28 (×4): 1 via ORAL
  Filled 2017-06-24 (×4): qty 1

## 2017-06-24 MED ORDER — HYDROCODONE-ACETAMINOPHEN 5-325 MG PO TABS
1.0000 | ORAL_TABLET | Freq: Once | ORAL | Status: AC
  Administered 2017-06-24: 1 via ORAL
  Filled 2017-06-24: qty 1

## 2017-06-24 MED ORDER — SODIUM CHLORIDE 0.9% FLUSH: 3.0000 mL | INTRAVENOUS | Status: DC | PRN

## 2017-06-24 MED ORDER — CHLORTHALIDONE 25 MG PO TABS
12.5000 mg | ORAL_TABLET | Freq: Every day | ORAL | Status: DC
  Administered 2017-06-25 – 2017-06-28 (×4): 12.5 mg via ORAL
  Filled 2017-06-24 (×3): qty 1
  Filled 2017-06-24 (×2): qty 0.5
  Filled 2017-06-24: qty 1
  Filled 2017-06-24 (×2): qty 0.5

## 2017-06-24 MED ORDER — MOMETASONE FURO-FORMOTEROL FUM 100-5 MCG/ACT IN AERO
2.0000 | INHALATION_SPRAY | Freq: Two times a day (BID) | RESPIRATORY_TRACT | Status: DC
  Administered 2017-06-25 – 2017-06-28 (×6): 2 via RESPIRATORY_TRACT
  Filled 2017-06-24 (×2): qty 8.8

## 2017-06-24 MED ORDER — DILTIAZEM HCL ER COATED BEADS 240 MG PO CP24
240.0000 mg | ORAL_CAPSULE | Freq: Every day | ORAL | Status: DC
  Administered 2017-06-25 – 2017-06-28 (×4): 240 mg via ORAL
  Filled 2017-06-24 (×5): qty 1

## 2017-06-24 NOTE — ED Notes (Signed)
Pt got to a sitting position with maximum assistance and states due to pain she cannot stand. EDP aware

## 2017-06-24 NOTE — H&P (Addendum)
History and Physical    Nancy Hicks XBM:[id number] DOB: 06/29/1926 DOA: 06/24/2017  PCP: Asencion Noble, MD   Patient coming from: Home  Chief Complaint: Fall with left rib cage pain  HPI: Nancy Hicks is a 84 y.o. 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  She states that she simply slipped and fell out of her bathtub and did not have any lightheadedness, dizziness, or loss of consciousness surrounding the event.  She struck the left side of her chest wall as well as her head and did not notice any severe pain until she woke up this morning with sharp, stabbing left chest wall and thoracic spine pain.  She took Tylenol at home with no significant improvement and noted that as she was taking a car ride to [place] today, she had unrelenting pain and could not move very much at all due to the severity of her pain. She denies any chest pain, dyspnea, palpitations, dizziness, lightheadedness, nausea, vomiting, or abdominal pain.   ED Course: Vital signs noted to be stable and patient is on room air.  She is laying flat on the stretcher and is hesitant to move on account of her pain which is currently poorly controlled with oral narcotics that have been given in the ED.  Laboratory data demonstrates sodium 133, creatinine 1.07 which is at her usual baseline, and glucose 118.  CT the head negative for any acute findings aside from some chronic atrophy, CT of abdomen with no acute findings, CT of the lumbar spine demonstrates left ninth and 10th acute nondisplaced rib fractures along with some lumbar stenosis at the upper levels.  Review of Systems: All others reviewed and otherwise negative.  Past Medical History:  Diagnosis Date  . Anemia, unspecified   . Anxiety state, unspecified   . Blood dyscrasia    RV factor def  . Cancer ([place])    skin  . Degeneration of cervical intervertebral disc   . Disorder of bone and cartilage, unspecified   . Diverticulosis of colon (without mention of hemorrhage) 01/13/1999  . Esophageal reflux   . Esophageal stricture   . Factor V deficiency ([place])   . Factor V deficiency ([place])   . Hiatal hernia 04/09/2010   ? Paraesophageal component on chest CT 2006  . HTN (hypertension)   . Irritable bowel syndrome   . Neurogenic bladder   . Osteoarthrosis, unspecified whether generalized or localized, unspecified site   . Peripheral vascular disease (HCC)    pe, dvt hx  . Personal history of thrombophlebitis   . Personal history of venous thrombosis and embolism   . 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 Pure hypercholesterolemia   . Raynaud phenomenon   . Shortness of breath dyspnea   . Unspecified asthma(493.90)   . Unspecified disorder resulting from impaired renal function    cysts    Past Surgical History:  Procedure Laterality Date  . CHOLECYSTECTOMY    . COLONOSCOPY  2000   Dr. Verl Blalock: moderate to severe sigmoid diverticulosis  . COLONOSCOPY  03/2015   Dr. Peggye Form at [facility]: TCS for abnormal sigmoid colon on PET. Moderate diverticulosis of the sigmoid colon, sessile 8-9 mm sigmoid: Polyp biopsied (given anticoagulation), two 3 mm ICV polyps removed. All polyps were tubular adenomas.  . ESOPHAGEAL MANOMETRY  10/26/2011  Procedure: ESOPHAGEAL MANOMETRY (EM);  Surgeon: Sable Feil, MD;  Location: [facility];  Service: Endoscopy;  Laterality: N/A;  . ESOPHAGOGASTRODUODENOSCOPY  03/2010   Dr. Verl Blalock: candida esophagitis, prolapsing hh with cameron lesions, multiple polyps  . EYE SURGERY Bilateral    cataracts  . Flexible sigmoidoscopy  05/2015   Dr. Peggye Form at [facility]: Performed off anticoagulation. 1-1.5 cm sessile sigmoid polyp removed, site tattooed. Pathology revealed tubular adenoma. Colonoscopy in 3-5 years  as medically appropriate.  Marland Kitchen PAROTIDECTOMY Left 11/14/2014  . PAROTIDECTOMY Left 11/14/2014   Procedure: LEFT LATERAL PAROTIDECTOMY;  Surgeon: Leta Baptist, MD;  Location: [place];  Service: ENT;  Laterality: Left;  . 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 TONSILLECTOMY       reports that she has never smoked. She has never used smokeless tobacco. She reports that she does not drink alcohol or use drugs.  Allergies  Allergen Reactions  . Metoclopramide Hcl Other (See Comments)    REACTION: causes uncontrolable leg movements  . Nitrofurantoin Nausea And Vomiting and Other (See Comments)    REACTION: dizziness    Family History  Problem Relation Age of Onset  . Parkinsonism Mother   . COPD Brother   . Asthma Brother   . Lung cancer Brother   . Heart attack Brother   . Diabetes Paternal Aunt     Prior to Admission medications   Medication Sig Start Date End Date Taking? Authorizing Provider  ADVAIR DISKUS 100-50 MCG/DOSE AEPB take 1 tablet twice a day Patient taking differently: Inhale one puff by mouth daily in the morning 01/01/17  Yes Noralee Space, MD  allopurinol (ZYLOPRIM) 300 MG tablet Take 300 mg by mouth daily.     Yes [provider]  Biotin 5000 MCG CAPS Take 1 capsule by mouth daily.   Yes [provider]  Calcium Citrate-Vitamin D (CITRACAL MAXIMUM) 315-250 MG-UNIT TABS Take 1 capsule by mouth daily.   Yes [provider]  chlorthalidone (HYGROTON) 25 MG tablet Take 12.5 mg by mouth daily.   Yes [provider]  Cholecalciferol (VITAMIN D3) 2000 UNITS TABS Take 1 tablet by mouth daily.     Yes [provider]  DILT-XR 240 MG 24 hr capsule Take 240 mg by mouth daily. 10/20/16  Yes [provider]  esomeprazole (NEXIUM) 40 MG capsule Take 40 mg by mouth daily before breakfast.  06/09/11  Yes Sable Feil, MD  fluticasone [facility]) 50 MCG/ACT nasal spray instill 2 sprays into each nostril once daily Patient taking  differently: instill 2 sprays into each nostril once daily as needed for allergies 04/14/17  Yes Noralee Space, MD  furosemide (LASIX) 20 MG tablet Take 1 tablet (20 mg total) by mouth daily. 07/19/13  Yes Noralee Space, MD  losartan (COZAAR) 100 MG tablet take 1 tablet by mouth once daily   Yes Noralee Space, MD  methenamine (HIPREX) 1 G tablet Take 500 mg by mouth 2 (two) times daily with a meal.  04/11/11  Yes [provider]  methylcellulose (CITRUCEL) oral powder Take 1 tsp by mouth daily    Yes [provider]  Multiple Vitamin (MULITIVITAMIN WITH MINERALS) TABS Take 1 tablet by mouth daily.   Yes [provider]  Polyethyl Glycol-Propyl Glycol (SYSTANE ULTRA PF) 0.4-0.3 % SOLN Place 2 drops into both eyes 3 (three) times daily.    Yes [provider]  potassium chloride SA (K-DUR,KLOR-CON) 20 MEQ tablet  take 1 tablet by mouth once daily   Yes Noralee Space, MD  sertraline (ZOLOFT) 50 MG tablet Take 1 tablet (50 mg total) by mouth daily. 05/29/13  Yes Noralee Space, MD  simvastatin (ZOCOR) 10 MG tablet Take 10 mg by mouth at bedtime.     Yes [provider]  VENTOLIN HFA 108 (90 BASE) MCG/ACT inhaler inhale 2 puffs by mouth every 6 hours AS NEEDED FOR WHEEZING 11/09/14  Yes Noralee Space, MD  warfarin (COUMADIN) 2.5 MG tablet Take 2.5 mg by mouth every evening. Takes 2.5 mg everyday.   Yes [provider]    Physical Exam: Vitals:   06/24/17 1806 06/24/17 1807  BP:  (!) 143/62  Pulse:  76  Resp:  18  Temp:  98.4 F (36.9 C)  SpO2:  95%  Weight: 59 kg (130 lb)     Constitutional: NAD, calm, comfortable Vitals:   06/24/17 1806 06/24/17 1807  BP:  (!) 143/62  Pulse:  76  Resp:  18  Temp:  98.4 F (36.9 C)  SpO2:  95%  Weight: 59 kg (130 lb)    Eyes: lids and conjunctivae normal ENMT: Mucous membranes are moist.  Neck: normal, supple Respiratory: clear to auscultation bilaterally. Normal respiratory effort. No accessory  muscle use.  Cardiovascular: Regular rate and rhythm, no murmurs. No extremity edema. Abdomen: no tenderness, no distention. Bowel sounds positive.  Musculoskeletal:  No joint deformity upper and lower extremities.   Skin: no rashes, lesions, ulcers.  Psychiatric: Normal judgment and insight. Alert and oriented x 3. Normal mood.   Labs on Admission: I have personally reviewed following labs and imaging studies  CBC: Recent Labs  Lab 06/24/17 1916  WBC 5.9  NEUTROABS 4.1  HGB 12.6  HCT 39.1  MCV 96.3  PLT 409   Basic Metabolic Panel: Recent Labs  Lab 06/24/17 1916  NA 133*  K 3.8  CL 100*  CO2 22  GLUCOSE 118*  BUN 33*  CREATININE 1.07*  CALCIUM 9.6   GFR: Estimated Creatinine Clearance: 28.6 mL/min (A) (by C-G formula based on SCr of 1.07 mg/dL (H)). Liver Function Tests: No results for input(s): AST, ALT, ALKPHOS, BILITOT, PROT, ALBUMIN in the last 168 hours. No results for input(s): LIPASE, AMYLASE in the last 168 hours. No results for input(s): AMMONIA in the last 168 hours. Coagulation Profile: Recent Labs  Lab 06/24/17 1916  INR 2.60   Cardiac Enzymes: No results for input(s): CKTOTAL, CKMB, CKMBINDEX, TROPONINI in the last 168 hours. BNP (last 3 results) No results for input(s): PROBNP in the last 8760 hours. HbA1C: No results for input(s): HGBA1C in the last 72 hours. CBG: No results for input(s): GLUCAP in the last 168 hours. Lipid Profile: No results for input(s): CHOL, HDL, LDLCALC, TRIG, CHOLHDL, LDLDIRECT in the last 72 hours. Thyroid Function Tests: No results for input(s): TSH, T4TOTAL, FREET4, T3FREE, THYROIDAB in the last 72 hours. Anemia Panel: No results for input(s): VITAMINB12, FOLATE, FERRITIN, TIBC, IRON, RETICCTPCT in the last 72 hours. Urine analysis:    Component Value Date/Time   COLORURINE YELLOW 06/24/2017 1902   APPEARANCEUR CLEAR 06/24/2017 1902   LABSPEC 1.009 06/24/2017 1902   PHURINE 6.0 06/24/2017 1902   GLUCOSEU  NEGATIVE 06/24/2017 1902   HGBUR NEGATIVE 06/24/2017 1902   BILIRUBINUR NEGATIVE 06/24/2017 1902   KETONESUR NEGATIVE 06/24/2017 1902   PROTEINUR NEGATIVE 06/24/2017 1902   NITRITE NEGATIVE 06/24/2017 1902   LEUKOCYTESUR NEGATIVE 06/24/2017 1902    Radiological  Exams on Admission: Ct Abdomen Pelvis Wo Contrast  Result Date: 06/24/2017 CLINICAL DATA:  [place] at home.  Left-sided pain. EXAM: CT ABDOMEN AND PELVIS WITHOUT CONTRAST TECHNIQUE: Multidetector CT imaging of the abdomen and pelvis was performed following the standard protocol without IV contrast. COMPARISON:  01/07/2016 FINDINGS: Lower chest: Large hiatal hernia containing the stomach and mesentery. No sign of obstruction or torsion. Compressive atelectasis in the left lower lobe. Left lower lobe pneumonia not excluded. Hepatobiliary: Liver parenchyma is normal. Previous cholecystectomy. Pancreas: Normal Spleen: Normal Adrenals/Urinary Tract: Adrenal glands are normal. Some chronic renal atrophy. Chronic renal cysts, some of which are hyperdense. Mild chronic fullness of the left renal collecting system an ureter scratch mild chronic fullness of the renal collecting systems and ureters without evidence acute obstruction. The bladder is somewhat distended, possibly responsible for that finding. Stomach/Bowel: No acute bowel finding. Large hiatal hernia containing the stomach as noted above. No sign of bowel obstruction. No sign of inflammatory disease in the abdomen. Diverticulosis without evidence of diverticulitis. Vascular/Lymphatic: Aortic atherosclerosis. No aneurysm. IVC is normal. No retroperitoneal adenopathy. Reproductive: No pelvic mass. Other: No free fluid or air. Musculoskeletal: Curvature in chronic degenerative changes throughout the lumbar region. No acute bone finding. IMPRESSION: No acute or traumatic finding. Large chronic hiatal hernia containing most of the stomach. Compressive atelectasis in the left lower lobe. Cannot rule out  left base pneumonia. No acute abdominal or pelvic finding. Multiple renal cysts, some hyperdense. Diverticulosis without evidence of diverticulitis. Aortic atherosclerosis. Curvature and chronic degenerative changes of the spine. Electronically Signed   By: Nelson Chimes M.D.   On: 06/24/2017 20:54   Ct Head Wo Contrast  Result Date: 06/24/2017 CLINICAL DATA:  [place] at home last night. EXAM: CT HEAD WITHOUT CONTRAST TECHNIQUE: Contiguous axial images were obtained from the base of the skull through the vertex without intravenous contrast. COMPARISON:  12/22/2006 FINDINGS: Brain: Age related atrophy. Chronic small-vessel ischemic changes of the white matter. No sign of acute infarction, mass lesion, hemorrhage, hydrocephalus or extra-axial collection. Vascular: There is atherosclerotic calcification of the major vessels at the base of the brain. Skull: Negative Sinuses/Orbits: Clear/normal Other: None IMPRESSION: No acute or traumatic finding. Age related atrophy and chronic small-vessel ischemic changes. Electronically Signed   By: Nelson Chimes M.D.   On: 06/24/2017 20:48   Ct L-spine No Charge  Result Date: 06/24/2017 CLINICAL DATA:  Thoracic spine pain after fall last night. EXAM: CT LUMBAR SPINE WITHOUT CONTRAST TECHNIQUE: Multidetector CT imaging of the lumbar spine was performed without intravenous contrast administration. Multiplanar CT image reconstructions were also generated. COMPARISON:  None. FINDINGS: SEGMENTATION: For the purposes of this report the last well-formed intervertebral disc space is reported as L5-S1. ALIGNMENT: Maintained lumbar lordosis. Minimal grade 1 T12-L1 through L3-4 retrolisthesis. No spondylolysis. Broad levoscoliosis on this nonweightbearing examination. VERTEBRAE: Vertebral bodies and posterior elements are intact. Severe T12-L1 through L3-4 and L5-S1 disc height loss with vacuum disc, endplate sclerosis and marginal spurring compatible with degenerative discs. Moderate to  severe L4-5 degenerative disc. Multilevel severe facet arthropathy. Osteopenia without destructive bony lesions. Acute LEFT ninth and tenth nondisplaced rib fractures. PARASPINAL AND OTHER SOFT TISSUES: Please see CT of abdomen and pelvis from same day, reported separately. DISC LEVELS: L1-2: Retrolisthesis. Endplate spurring. Mild canal stenosis. Severe RIGHT and moderate to severe LEFT neural foraminal narrowing. L2-3: Retrolisthesis. Endplate spurring. Severe RIGHT facet arthropathy. Mild canal stenosis. Moderate RIGHT neural foraminal narrowing. L3-4: Retrolisthesis. Endplate spurring with mild to moderate facet arthropathy.  Mild canal stenosis. Severe RIGHT greater than LEFT neural foraminal narrowing. L4-5: Small broad-based disc osteophyte complex. Moderate to severe facet arthropathy. No canal stenosis. Severe RIGHT, moderate to severe LEFT neural foraminal narrowing. L5-S1: Small broad-based disc osteophyte complex. Severe facet arthropathy. No canal stenosis. Moderate RIGHT, severe LEFT neural foraminal narrowing. IMPRESSION: 1. No acute lumbar spine fracture. Multilevel retrolisthesis on degenerative basis. 2. Acute nondisplaced LEFT ninth and tenth rib fractures. 3. Mild canal stenosis L1-2 through L3-4. Multilevel severe neural foraminal narrowing. Electronically Signed   By: Elon Alas M.D.   On: 06/24/2017 20:53    Assessment/Plan Principal Problem:   Left rib fracture Active Problems:   Essential hypertension   GERD (gastroesophageal reflux disease)   COPD with chronic bronchitis (HCC)   Factor V deficiency (HCC)   CKD (chronic kidney disease)   Hx pulmonary embolism    1. Left acute nondisplaced rib fractures of ninth and 10th rib status post mechanical fall.  Patient has severe pain that is not controlled with oral medications at this time and she cannot sit up or ambulate on account of her pain for which she warrants observation and pain control with physical therapy  evaluation for rib belt.  We will place on Toradol as well as oral narcotics, lidoderm patch, and some muscle relaxers to see if the multimodal approach will help her pain control. IS.  May consider intercostal nerve block if there is no significant relief. 2. Mild hyponatremia.  Likely related to diuretic use.  Continue on current medications with repeat BMP in a.m. to monitor closely.  She appears euvolemic and does not appear symptomatic from this. 3. COPD.  Continue home Advair with breathing treatments as needed for wheezing or shortness of breath. 4. 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  Continue Coumadin with pharmacy to help follow.  INR is currently therapeutic at 2.6. 5. CKD stage III.  Currently at baseline.  Continue to monitor. 6. Hypertension.  Continue home medications. Heart healthy diet. 7. GERD.  Continue on Protonix.   DVT prophylaxis: Coumadin Code Status: Full Family Communication: Daughter at bedside Disposition Plan: Pain management and PT eval for possible rib belt. Likely DC in AM if improved. Consults called: None Admission status: Observation, MedSurg   Rodena Goldmann DO [facility] Pager [phone]  If 7PM-7AM, please contact night-coverage [email or web address] Password [facility]  06/24/2017, 10:46 PM

## 2017-06-24 NOTE — ED Provider Notes (Signed)
[facility] EMERGENCY DEPARTMENT Provider Note   CSN: [id number] Arrival date & time: 06/24/17  1803     History   Chief Complaint Chief Complaint  Patient presents with  . Back Pain    HPI Nancy Hicks is a 84 y.o. female.  She had a fall getting out of the bathtub last evening.  She struck her head and her back.  Today she noticed worsening low back pain which became more severe after a car ride.  She is complaining of severe sharp stabbing left low back pain worse with movement improved with rest.  She is took some Tylenol with minimal improvement.  It is not associated with any numbness weakness or tingling in her lower extremities.  She is not having any chest pain or shortness of breath.  She denies any headache or blurry vision.  She has no prior history of significant back problems.  The history is provided by the patient.  Back Pain   This is a new problem. The current episode started yesterday. The problem occurs constantly. The problem has been gradually worsening. The pain is associated with falling. The pain is present in the lumbar spine. The quality of the pain is described as stabbing and shooting. The pain does not radiate. The pain is severe. The symptoms are aggravated by bending, twisting and certain positions. Pertinent negatives include no chest pain, no fever, no numbness, no weight loss, no headaches, no abdominal pain, no bowel incontinence, no perianal numbness, no bladder incontinence, no dysuria, no pelvic pain, no leg pain, no paresthesias, no paresis, no tingling and no weakness.    Past Medical History:  Diagnosis Date  . Anemia, unspecified   . Anxiety state, unspecified   . Blood dyscrasia    RV factor def  . Cancer ([place])    skin  . Degeneration of cervical intervertebral disc   . Disorder of bone and cartilage, unspecified   . Diverticulosis of colon (without mention of hemorrhage) 01/13/1999  . Esophageal reflux   . Esophageal stricture     . Factor V deficiency ([place])   . Factor V deficiency ([place])   . Hiatal hernia 04/09/2010   ? Paraesophageal component on chest CT 2006  . HTN (hypertension)   . Irritable bowel syndrome   . Neurogenic bladder   . Osteoarthrosis, unspecified whether generalized or localized, unspecified site   . Peripheral vascular disease (HCC)    pe, dvt hx  . Personal history of thrombophlebitis   . Personal history of venous thrombosis and embolism   . 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 Pure hypercholesterolemia   . Raynaud phenomenon   . Shortness of breath dyspnea   . Unspecified asthma(493.90)   . Unspecified disorder resulting from impaired renal function    cysts    Patient Active Problem List   Diagnosis Date Noted  . History of DVT (deep vein thrombosis) 12/07/2016  . Acute pain of right shoulder   . Acute diverticulitis 01/07/2016  . Bright red rectal bleeding 01/07/2016  . Factor V deficiency ([place]) 01/07/2016  . CKD (chronic kidney disease) 01/07/2016  . Diverticulitis 01/07/2016  . Sigmoid diverticulitis 01/07/2016  . Diverticulitis of large intestine without perforation or abscess with bleeding   . Marginal zone lymphoma of extranodal and solid organ sites [facility]) 09/09/2015  . Hx pulmonary embolism 04/23/2015  . H/O superficial parotidectomy 11/14/2014  . Deep vein thrombosis of lower extremity ([place]) 07/27/2011  . Embolism, pulmonary with infarction (  [place]) 07/27/2011  . Diverticulitis of colon (without mention of hemorrhage)(562.11) 06/09/2011  . GERD (gastroesophageal reflux disease) 06/09/2011  . Right rib fracture 06/09/2011  . Anticoagulant long-term use 06/09/2011  . COPD with chronic bronchitis ([place]) 06/09/2011  . HH (hiatus hernia) 11/04/2010  . Dysphagia 03/11/2010  . RENAL INSUFFICIENCY 04/02/2008  . IBS 01/24/2008  . HYPERCHOLESTEROLEMIA 03/14/2007  . ASTHMA 03/14/2007  . NEUROGENIC BLADDER 03/14/2007  . [place] DISEASE, CERVICAL 03/14/2007  . DEEP  VENOUS THROMBOPHLEBITIS, HX OF 03/14/2007  . ANEMIA 01/10/2007  . ANXIETY 01/10/2007  . Essential hypertension 01/10/2007  . Colon, diverticulosis 01/10/2007  . DEGENERATIVE JOINT DISEASE 01/10/2007  . OSTEOPENIA 01/10/2007    Past Surgical History:  Procedure Laterality Date  . CHOLECYSTECTOMY    . COLONOSCOPY  2000   Dr. Verl Blalock: moderate to severe sigmoid diverticulosis  . COLONOSCOPY  03/2015   Dr. Peggye Form at [facility]: TCS for abnormal sigmoid colon on PET. Moderate diverticulosis of the sigmoid colon, sessile 8-9 mm sigmoid: Polyp biopsied (given anticoagulation), two 3 mm ICV polyps removed. All polyps were tubular adenomas.  . ESOPHAGEAL MANOMETRY  10/26/2011   Procedure: ESOPHAGEAL MANOMETRY (EM);  Surgeon: Sable Feil, MD;  Location: [facility];  Service: Endoscopy;  Laterality: N/A;  . ESOPHAGOGASTRODUODENOSCOPY  03/2010   Dr. Verl Blalock: candida esophagitis, prolapsing hh with cameron lesions, multiple polyps  . EYE SURGERY Bilateral    cataracts  . Flexible sigmoidoscopy  05/2015   Dr. Peggye Form at [facility]: Performed off anticoagulation. 1-1.5 cm sessile sigmoid polyp removed, site tattooed. Pathology revealed tubular adenoma. Colonoscopy in 3-5 years as medically appropriate.  Marland Kitchen PAROTIDECTOMY Left 11/14/2014  . PAROTIDECTOMY Left 11/14/2014   Procedure: LEFT LATERAL PAROTIDECTOMY;  Surgeon: Leta Baptist, MD;  Location: [place];  Service: ENT;  Laterality: Left;  . 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 TONSILLECTOMY       OB History    Gravida  3   Para  2   Term  2   Preterm      AB  1   Living        SAB  1   TAB      Ectopic      Multiple      Live Births               Home Medications    Prior to Admission medications   Medication Sig Start Date End Date Taking? Authorizing Provider  ADVAIR DISKUS 100-50 MCG/DOSE AEPB take 1 tablet twice a day Patient taking differently: Inhale one puff by mouth daily in the morning  01/01/17  Yes Noralee Space, MD  allopurinol (ZYLOPRIM) 300 MG tablet Take 300 mg by mouth daily.     Yes [provider]  Biotin 5000 MCG CAPS Take 1 capsule by mouth daily.   Yes [provider]  Calcium Citrate-Vitamin D (CITRACAL MAXIMUM) 315-250 MG-UNIT TABS Take 1 capsule by mouth daily.   Yes [provider]  chlorthalidone (HYGROTON) 25 MG tablet Take 12.5 mg by mouth daily.   Yes [provider]  Cholecalciferol (VITAMIN D3) 2000 UNITS TABS Take 1 tablet by mouth daily.     Yes [provider]  DILT-XR 240 MG 24 hr capsule Take 240 mg by mouth daily. 10/20/16  Yes [provider]  esomeprazole (NEXIUM) 40 MG capsule Take 40 mg by mouth daily before breakfast.  06/09/11  Yes Sable Feil, MD  fluticasone [facility])  50 MCG/ACT nasal spray instill 2 sprays into each nostril once daily Patient taking differently: instill 2 sprays into each nostril once daily as needed for allergies 04/14/17  Yes Noralee Space, MD  furosemide (LASIX) 20 MG tablet Take 1 tablet (20 mg total) by mouth daily. 07/19/13  Yes Noralee Space, MD  losartan (COZAAR) 100 MG tablet take 1 tablet by mouth once daily   Yes Noralee Space, MD  methenamine (HIPREX) 1 G tablet Take 500 mg by mouth 2 (two) times daily with a meal.  04/11/11  Yes [provider]  methylcellulose (CITRUCEL) oral powder Take 1 tsp by mouth daily    Yes [provider]  Multiple Vitamin (MULITIVITAMIN WITH MINERALS) TABS Take 1 tablet by mouth daily.   Yes [provider]  Polyethyl Glycol-Propyl Glycol (SYSTANE ULTRA PF) 0.4-0.3 % SOLN Place 2 drops into both eyes 3 (three) times daily.    Yes [provider]  potassium chloride SA (K-DUR,KLOR-CON) 20 MEQ tablet take 1 tablet by mouth once daily   Yes Noralee Space, MD  sertraline (ZOLOFT) 50 MG tablet Take 1 tablet (50 mg total) by mouth daily. 05/29/13  Yes Noralee Space, MD  simvastatin (ZOCOR) 10 MG  tablet Take 10 mg by mouth at bedtime.     Yes [provider]  VENTOLIN HFA 108 (90 BASE) MCG/ACT inhaler inhale 2 puffs by mouth every 6 hours AS NEEDED FOR WHEEZING 11/09/14  Yes Noralee Space, MD  warfarin (COUMADIN) 2.5 MG tablet Take 2.5 mg by mouth every evening. Takes 2.5 mg everyday.   Yes [provider]    Family History Family History  Problem Relation Age of Onset  . Parkinsonism Mother   . COPD Brother   . Asthma Brother   . Lung cancer Brother   . Heart attack Brother   . Diabetes Paternal Aunt     Social History Social History   Tobacco Use  . Smoking status: Never Smoker  . Smokeless tobacco: Never Used  Substance Use Topics  . Alcohol use: No  . Drug use: No     Allergies   Metoclopramide hcl and Nitrofurantoin   Review of Systems Review of Systems  Constitutional: Negative for chills, fever and weight loss.  HENT: Negative for ear pain and sore throat.   Eyes: Negative for pain and visual disturbance.  Respiratory: Negative for cough and shortness of breath.   Cardiovascular: Negative for chest pain and palpitations.  Gastrointestinal: Negative for abdominal pain, bowel incontinence and vomiting.  Genitourinary: Negative for bladder incontinence, dysuria, hematuria and pelvic pain.  Musculoskeletal: Positive for back pain. Negative for arthralgias.  Skin: Negative for color change and rash.  Neurological: Negative for tingling, seizures, syncope, weakness, numbness, headaches and paresthesias.  All other systems reviewed and are negative.    Physical Exam Updated Vital Signs BP (!) 143/62   Pulse 76   Temp 98.4 F (36.9 C)   Resp 18   Wt 59 kg (130 lb)   SpO2 95%   BMI 25.39 kg/m   Physical Exam  Constitutional: She appears well-developed and well-nourished. No distress.  HENT:  Head: Normocephalic and atraumatic.  Eyes: Conjunctivae are normal.  Neck: Neck supple.  Cardiovascular: Normal rate and regular rhythm.    No murmur heard. Pulmonary/Chest: Effort normal and breath sounds normal. No respiratory distress.  Abdominal: Soft. There is no tenderness.  Musculoskeletal: She exhibits no edema.       Cervical  back: Normal.       Thoracic back: Normal.       Lumbar back: She exhibits tenderness, pain and spasm. She exhibits no bony tenderness.       Arms: Neurological: She is alert.  Skin: Skin is warm and dry.  Psychiatric: She has a normal mood and affect.  Nursing note and vitals reviewed.    ED Treatments / Results  Labs (all labs ordered are listed, but only abnormal results are displayed) Labs Reviewed  BASIC METABOLIC PANEL - Abnormal; Notable for the following components:      Result Value   Sodium 133 (*)    Chloride 100 (*)    Glucose, Bld 118 (*)    BUN 33 (*)    Creatinine, Ser 1.07 (*)    GFR calc non Af Amer 45 (*)    GFR calc Af Amer 52 (*)    All other components within normal limits  PROTIME-INR - Abnormal; Notable for the following components:   Prothrombin Time 27.6 (*)    All other components within normal limits  CBC WITH DIFFERENTIAL/PLATELET  URINALYSIS, ROUTINE W REFLEX MICROSCOPIC    EKG None  Radiology Ct Abdomen Pelvis Wo Contrast  Result Date: 06/24/2017 CLINICAL DATA:  [place] at home.  Left-sided pain. EXAM: CT ABDOMEN AND PELVIS WITHOUT CONTRAST TECHNIQUE: Multidetector CT imaging of the abdomen and pelvis was performed following the standard protocol without IV contrast. COMPARISON:  01/07/2016 FINDINGS: Lower chest: Large hiatal hernia containing the stomach and mesentery. No sign of obstruction or torsion. Compressive atelectasis in the left lower lobe. Left lower lobe pneumonia not excluded. Hepatobiliary: Liver parenchyma is normal. Previous cholecystectomy. Pancreas: Normal Spleen: Normal Adrenals/Urinary Tract: Adrenal glands are normal. Some chronic renal atrophy. Chronic renal cysts, some of which are hyperdense. Mild chronic fullness of the left  renal collecting system an ureter scratch mild chronic fullness of the renal collecting systems and ureters without evidence acute obstruction. The bladder is somewhat distended, possibly responsible for that finding. Stomach/Bowel: No acute bowel finding. Large hiatal hernia containing the stomach as noted above. No sign of bowel obstruction. No sign of inflammatory disease in the abdomen. Diverticulosis without evidence of diverticulitis. Vascular/Lymphatic: Aortic atherosclerosis. No aneurysm. IVC is normal. No retroperitoneal adenopathy. Reproductive: No pelvic mass. Other: No free fluid or air. Musculoskeletal: Curvature in chronic degenerative changes throughout the lumbar region. No acute bone finding. IMPRESSION: No acute or traumatic finding. Large chronic hiatal hernia containing most of the stomach. Compressive atelectasis in the left lower lobe. Cannot rule out left base pneumonia. No acute abdominal or pelvic finding. Multiple renal cysts, some hyperdense. Diverticulosis without evidence of diverticulitis. Aortic atherosclerosis. Curvature and chronic degenerative changes of the spine. Electronically Signed   By: Nelson Chimes M.D.   On: 06/24/2017 20:54   Ct Head Wo Contrast  Result Date: 06/24/2017 CLINICAL DATA:  [place] at home last night. EXAM: CT HEAD WITHOUT CONTRAST TECHNIQUE: Contiguous axial images were obtained from the base of the skull through the vertex without intravenous contrast. COMPARISON:  12/22/2006 FINDINGS: Brain: Age related atrophy. Chronic small-vessel ischemic changes of the white matter. No sign of acute infarction, mass lesion, hemorrhage, hydrocephalus or extra-axial collection. Vascular: There is atherosclerotic calcification of the major vessels at the base of the brain. Skull: Negative Sinuses/Orbits: Clear/normal Other: None IMPRESSION: No acute or traumatic finding. Age related atrophy and chronic small-vessel ischemic changes. Electronically Signed   By: Nelson Chimes  M.D.   On: 06/24/2017 20:48  Ct L-spine No Charge  Result Date: 06/24/2017 CLINICAL DATA:  Thoracic spine pain after fall last night. EXAM: CT LUMBAR SPINE WITHOUT CONTRAST TECHNIQUE: Multidetector CT imaging of the lumbar spine was performed without intravenous contrast administration. Multiplanar CT image reconstructions were also generated. COMPARISON:  None. FINDINGS: SEGMENTATION: For the purposes of this report the last well-formed intervertebral disc space is reported as L5-S1. ALIGNMENT: Maintained lumbar lordosis. Minimal grade 1 T12-L1 through L3-4 retrolisthesis. No spondylolysis. Broad levoscoliosis on this nonweightbearing examination. VERTEBRAE: Vertebral bodies and posterior elements are intact. Severe T12-L1 through L3-4 and L5-S1 disc height loss with vacuum disc, endplate sclerosis and marginal spurring compatible with degenerative discs. Moderate to severe L4-5 degenerative disc. Multilevel severe facet arthropathy. Osteopenia without destructive bony lesions. Acute LEFT ninth and tenth nondisplaced rib fractures. PARASPINAL AND OTHER SOFT TISSUES: Please see CT of abdomen and pelvis from same day, reported separately. DISC LEVELS: L1-2: Retrolisthesis. Endplate spurring. Mild canal stenosis. Severe RIGHT and moderate to severe LEFT neural foraminal narrowing. L2-3: Retrolisthesis. Endplate spurring. Severe RIGHT facet arthropathy. Mild canal stenosis. Moderate RIGHT neural foraminal narrowing. L3-4: Retrolisthesis. Endplate spurring with mild to moderate facet arthropathy. Mild canal stenosis. Severe RIGHT greater than LEFT neural foraminal narrowing. L4-5: Small broad-based disc osteophyte complex. Moderate to severe facet arthropathy. No canal stenosis. Severe RIGHT, moderate to severe LEFT neural foraminal narrowing. L5-S1: Small broad-based disc osteophyte complex. Severe facet arthropathy. No canal stenosis. Moderate RIGHT, severe LEFT neural foraminal narrowing. IMPRESSION: 1. No acute  lumbar spine fracture. Multilevel retrolisthesis on degenerative basis. 2. Acute nondisplaced LEFT ninth and tenth rib fractures. 3. Mild canal stenosis L1-2 through L3-4. Multilevel severe neural foraminal narrowing. Electronically Signed   By: Elon Alas M.D.   On: 06/24/2017 20:53    Procedures Procedures (including critical care time)  Medications Ordered in ED Medications  HYDROcodone-acetaminophen (NORCO/VICODIN) 5-325 MG per tablet 1 tablet (1 tablet Oral Given 06/24/17 1922)     Initial Impression / Assessment and Plan / ED Course  I have reviewed the triage vital signs and the nursing notes.  Pertinent labs & imaging results that were available during my care of the patient were reviewed by me and considered in my medical decision making (see chart for details).  Clinical Course as of Jun 26 1712  Thu Jun 24, 2017  1932 Differential diagnosis includes fracture, musculoskeletal contusion, retroperitoneal bleed, intracerebral bleed.  On Coumadin with worsening pain feel that CT imaging is needed for her head although her mental status is completely normal, but more importantly for her back lumbar and abdomen to evaluate for retroperitoneal bleed and lumbar fracture.  She has neuro intact so I do not think this is a cord issue.   [MB]  2122 You to patient and updated her on the results of her imaging.  There was no obvious fracture and no bleeding.  She still having a moderate amount of pain.  We will going to re-dose her with another Vicodin and will try to ambulate her and see if she will be able to safely go home.   [MB]  2214 Patient given second dose of pain medicine and attempted to ambulate.  She was barely able to sit up on the side of the bed but unable to actually stand.  I do not feel that this lady would do well at home with her pain control and difficulty walking especially on Coumadin.  She would be at high risk for fall.  I paged the hospitalist for discussion  regarding admission.   [MB]    Clinical Course User Index [MB] Hayden Rasmussen, MD    Final Clinical Impressions(s) / ED Diagnoses   Final diagnoses:  Closed fracture of multiple ribs of left side, initial encounter    ED Discharge Orders    None       Hayden Rasmussen, MD 06/26/17 1715

## 2017-06-24 NOTE — ED Triage Notes (Signed)
Pt had fall at home last night and had no pain. This am she rode to [place] and back home now c/o left thoracic back pain that is unrelieved with tylenol.

## 2017-06-25 ENCOUNTER — Other Ambulatory Visit: Payer: Self-pay

## 2017-06-25 ENCOUNTER — Encounter (HOSPITAL_COMMUNITY): Payer: Self-pay | Admitting: *Deleted

## 2017-06-25 DIAGNOSIS — J449 Chronic obstructive pulmonary disease, unspecified: Secondary | ICD-10-CM | POA: Diagnosis not present

## 2017-06-25 DIAGNOSIS — D682 Hereditary deficiency of other clotting factors: Secondary | ICD-10-CM

## 2017-06-25 DIAGNOSIS — N189 Chronic kidney disease, unspecified: Secondary | ICD-10-CM

## 2017-06-25 DIAGNOSIS — S2242XD Multiple fractures of ribs, left side, subsequent encounter for fracture with routine healing: Secondary | ICD-10-CM | POA: Diagnosis not present

## 2017-06-25 DIAGNOSIS — K219 Gastro-esophageal reflux disease without esophagitis: Secondary | ICD-10-CM

## 2017-06-25 DIAGNOSIS — I10 Essential (primary) hypertension: Secondary | ICD-10-CM | POA: Diagnosis not present

## 2017-06-25 LAB — CBC
HCT: 37.3 % (ref 36.0–46.0)
Hemoglobin: 11.9 g/dL — ABNORMAL LOW (ref 12.0–15.0)
MCH: 31.2 pg (ref 26.0–34.0)
MCHC: 31.9 g/dL (ref 30.0–36.0)
MCV: 97.9 fL (ref 78.0–100.0)
Platelets: 198 K/uL (ref 150–400)
RBC: 3.81 MIL/uL — ABNORMAL LOW (ref 3.87–5.11)
RDW: 15 % (ref 11.5–15.5)
WBC: 5.4 K/uL (ref 4.0–10.5)

## 2017-06-25 LAB — BASIC METABOLIC PANEL WITH GFR
Anion gap: 11 (ref 5–15)
BUN: 33 mg/dL — ABNORMAL HIGH (ref 6–20)
CO2: 24 mmol/L (ref 22–32)
Calcium: 9.3 mg/dL (ref 8.9–10.3)
Chloride: 101 mmol/L (ref 101–111)
Creatinine, Ser: 1.2 mg/dL — ABNORMAL HIGH (ref 0.44–1.00)
GFR calc Af Amer: 45 mL/min — ABNORMAL LOW
GFR calc non Af Amer: 39 mL/min — ABNORMAL LOW
Glucose, Bld: 116 mg/dL — ABNORMAL HIGH (ref 65–99)
Potassium: 4.5 mmol/L (ref 3.5–5.1)
Sodium: 136 mmol/L (ref 135–145)

## 2017-06-25 MED ORDER — CALCIUM CARBONATE-VITAMIN D 500-200 MG-UNIT PO TABS
1.0000 | ORAL_TABLET | Freq: Every day | ORAL | Status: DC
  Administered 2017-06-25 – 2017-06-28 (×4): 1 via ORAL
  Filled 2017-06-25 (×4): qty 1

## 2017-06-25 MED ORDER — WARFARIN SODIUM 2.5 MG PO TABS
2.5000 mg | ORAL_TABLET | Freq: Once | ORAL | Status: AC
  Administered 2017-06-25: 2.5 mg via ORAL
  Filled 2017-06-25: qty 1

## 2017-06-25 MED ORDER — WARFARIN - PHARMACIST DOSING INPATIENT: Freq: Every day | Status: DC

## 2017-06-25 NOTE — Progress Notes (Signed)
PROGRESS NOTE    Nancy Hicks  CZY:[id number]  DOB: 1926-10-05  DOA: 06/24/2017 PCP: Asencion Noble, MD   Brief Admission Hx:  Nancy Hicks is a 84 y.o. 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  She states that she simply slipped and fell out of her bathtub and did not have any lightheadedness, dizziness, or loss of consciousness surrounding the event.   MDM/Assessment & Plan:   1. Left acute nondisplaced rib fractures of ninth and 10th rib status post mechanical fall.  Patient has severe pain that is not controlled with oral medications at this time and she cannot sit up or ambulate on account of her pain for which she warrants observation and pain control with physical therapy evaluation for rib belt.  We will place on Toradol as well as oral narcotics, lidoderm patch, and some muscle relaxers to see if the multimodal approach will help her pain control. IS.  May consider intercostal nerve block if there is no significant relief.  PT evaluation pending will defer to their recommendations about disposition.    2. Mild hyponatremia.  Resolved now.  Likely related to diuretic use.  Continue on current medications.  She appears euvolemic and does not appear symptomatic from this. 3. COPD.  Continue home Advair with breathing treatments as needed for wheezing or shortness of breath. 4. 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  Continue Coumadin with pharmacy to help follow.  INR is currently therapeutic at 2.6. 5. CKD stage III.  Currently at baseline.  Continue to monitor. 6. Hypertension.  Continue home medications. Heart healthy diet. 7. GERD.  Continue on Protonix.  DVT prophylaxis: Coumadin Code Status: Full Family Communication: Daughter at bedside Disposition Plan:  awaiting recommendations of PT eval.   Subjective: Pt says she does not have any pain when she is not moving but has severe pain with movement.  She has not gotten up yet.    Objective: Vitals:   06/24/17 2330 06/25/17 0039 06/25/17 0054 06/25/17 0549  BP: (!) 155/65 (!) 173/74  (!) 108/50  Pulse: 71 70  61  Resp:  18  17  Temp:  97.8 F (36.6 C)  97.7 F (36.5 C)  TempSrc:  Oral  Oral  SpO2: 92% 97%  95%  Weight:      Height:   5' (1.524 m)     Intake/Output Summary (Last 24 hours) at 06/25/2017 1313 Last data filed at 06/25/2017 0900 Gross per 24 hour  Intake 240 ml  Output -  Net 240 ml   Filed Weights   06/24/17 1806  Weight: 59 kg (130 lb)   REVIEW OF SYSTEMS  As per history otherwise all reviewed and reported negative  Exam:  General exam: awake, alert, NAD cooperative.   Respiratory system: shallow breathing due to rib pain. No increased work of breathing. Cardiovascular system: S1 & S2 heard. No JVD, murmurs, gallops, clicks or pedal edema. Gastrointestinal system: Abdomen is nondistended, soft and nontender. Normal bowel sounds heard. Central nervous system: Alert and oriented. No focal neurological deficits. Extremities: no CCE.  Data Reviewed: Basic Metabolic Panel: Recent Labs  Lab 06/24/17 1916 06/25/17 0450  NA 133* 136  K 3.8 4.5  CL 100* 101  CO2 22 24  GLUCOSE 118* 116*  BUN 33* 33*  CREATININE 1.07* 1.20*  CALCIUM 9.6 9.3   Liver Function Tests: No results for input(s): AST, ALT, ALKPHOS, BILITOT, PROT, ALBUMIN in the last 168 hours. No results for input(s): LIPASE, AMYLASE in the last 168 hours. No results for input(s): AMMONIA in the last 168 hours. CBC: Recent Labs  Lab 06/24/17 1916 06/25/17 0450  WBC 5.9 5.4  NEUTROABS 4.1  --   HGB 12.6 11.9*  HCT 39.1 37.3  MCV 96.3 97.9  PLT 196 198   Cardiac Enzymes: No results for input(s): CKTOTAL, CKMB, CKMBINDEX, TROPONINI in the last 168 hours. CBG (last 3)  No results for  input(s): GLUCAP in the last 72 hours. No results found for this or any previous visit (from the past 240 hour(s)).   Studies: Ct Abdomen Pelvis Wo Contrast  Result Date: 06/24/2017 CLINICAL DATA:  [place] at home.  Left-sided pain. EXAM: CT ABDOMEN AND PELVIS WITHOUT CONTRAST TECHNIQUE: Multidetector CT imaging of the abdomen and pelvis was performed following the standard protocol without IV contrast. COMPARISON:  01/07/2016 FINDINGS: Lower chest: Large hiatal hernia containing the stomach and mesentery. No sign of obstruction or torsion. Compressive atelectasis in the left lower lobe. Left lower lobe pneumonia not excluded. Hepatobiliary: Liver parenchyma is normal. Previous cholecystectomy. Pancreas: Normal Spleen: Normal Adrenals/Urinary Tract: Adrenal glands are normal. Some chronic renal atrophy. Chronic renal cysts, some of which are hyperdense. Mild chronic fullness of the left renal collecting system an ureter scratch mild chronic fullness of the renal collecting systems and ureters without evidence acute obstruction. The bladder is somewhat distended, possibly responsible for that finding. Stomach/Bowel: No acute bowel finding. Large hiatal hernia containing the stomach as noted above. No sign of bowel obstruction. No sign of inflammatory disease in the abdomen. Diverticulosis without evidence of diverticulitis. Vascular/Lymphatic: Aortic atherosclerosis. No aneurysm. IVC is normal. No retroperitoneal adenopathy. Reproductive: No pelvic mass. Other: No free fluid or air. Musculoskeletal: Curvature in chronic degenerative changes throughout the lumbar region. No acute bone finding. IMPRESSION: No acute or traumatic finding. Large chronic hiatal hernia containing most of the stomach. Compressive atelectasis in the left lower lobe. Cannot rule out left base pneumonia. No acute abdominal or pelvic finding. Multiple renal cysts, some hyperdense. Diverticulosis without evidence of diverticulitis. Aortic  atherosclerosis. Curvature and chronic degenerative changes of the spine. Electronically Signed   By: Nelson Chimes M.D.   On: 06/24/2017 20:54   Ct Head Wo Contrast  Result Date: 06/24/2017 CLINICAL DATA:  [place] at home last night. EXAM: CT HEAD WITHOUT CONTRAST TECHNIQUE: Contiguous axial images were obtained from the base of the skull through the vertex without intravenous contrast. COMPARISON:  12/22/2006 FINDINGS: Brain: Age related atrophy. Chronic small-vessel ischemic changes of the white matter. No sign of acute infarction, mass lesion, hemorrhage, hydrocephalus or extra-axial collection. Vascular: There is atherosclerotic calcification of the major vessels at the base of the brain. Skull: Negative Sinuses/Orbits: Clear/normal Other: None IMPRESSION: No acute or traumatic finding. Age related atrophy and chronic small-vessel ischemic changes. Electronically Signed   By: Nelson Chimes M.D.   On: 06/24/2017 20:48   Ct L-spine No Charge  Result Date: 06/24/2017 CLINICAL DATA:  Thoracic spine pain after fall last night. EXAM: CT LUMBAR SPINE WITHOUT CONTRAST TECHNIQUE: Multidetector CT imaging of the lumbar spine was performed without intravenous contrast administration. Multiplanar CT image reconstructions were also generated. COMPARISON:  None. FINDINGS: SEGMENTATION: For the purposes of this report the last well-formed intervertebral disc space is reported as L5-S1. ALIGNMENT: Maintained lumbar lordosis. Minimal grade 1 T12-L1 through  L3-4 retrolisthesis. No spondylolysis. Broad levoscoliosis on this nonweightbearing examination. VERTEBRAE: Vertebral bodies and posterior elements are intact. Severe T12-L1 through L3-4 and L5-S1 disc height loss with vacuum disc, endplate sclerosis and marginal spurring compatible with degenerative discs. Moderate to severe L4-5 degenerative disc. Multilevel severe facet arthropathy. Osteopenia without destructive bony lesions. Acute LEFT ninth and tenth nondisplaced rib  fractures. PARASPINAL AND OTHER SOFT TISSUES: Please see CT of abdomen and pelvis from same day, reported separately. DISC LEVELS: L1-2: Retrolisthesis. Endplate spurring. Mild canal stenosis. Severe RIGHT and moderate to severe LEFT neural foraminal narrowing. L2-3: Retrolisthesis. Endplate spurring. Severe RIGHT facet arthropathy. Mild canal stenosis. Moderate RIGHT neural foraminal narrowing. L3-4: Retrolisthesis. Endplate spurring with mild to moderate facet arthropathy. Mild canal stenosis. Severe RIGHT greater than LEFT neural foraminal narrowing. L4-5: Small broad-based disc osteophyte complex. Moderate to severe facet arthropathy. No canal stenosis. Severe RIGHT, moderate to severe LEFT neural foraminal narrowing. L5-S1: Small broad-based disc osteophyte complex. Severe facet arthropathy. No canal stenosis. Moderate RIGHT, severe LEFT neural foraminal narrowing. IMPRESSION: 1. No acute lumbar spine fracture. Multilevel retrolisthesis on degenerative basis. 2. Acute nondisplaced LEFT ninth and tenth rib fractures. 3. Mild canal stenosis L1-2 through L3-4. Multilevel severe neural foraminal narrowing. Electronically Signed   By: Elon Alas M.D.   On: 06/24/2017 20:53   Scheduled Meds: . allopurinol  300 mg Oral Daily  . calcium-vitamin D  1 tablet Oral Daily  . chlorthalidone  12.5 mg Oral Daily  . cholecalciferol  2,000 Units Oral Daily  . diltiazem  240 mg Oral Daily  . fluticasone  2 spray Each Nare Daily  . furosemide  20 mg Oral Daily  . lidocaine  1 patch Transdermal Q24H  . losartan  100 mg Oral Daily  . mometasone-formoterol  2 puff Inhalation BID  . multivitamin with minerals  1 tablet Oral Daily  . pantoprazole  40 mg Oral Daily  . polyvinyl alcohol  2 drop Both Eyes TID  . potassium chloride SA  20 mEq Oral Daily  . sertraline  50 mg Oral Daily  . simvastatin  10 mg Oral QHS  . sodium chloride flush  3 mL Intravenous Q12H  . warfarin  2.5 mg Oral ONCE-1800  . Warfarin -  Pharmacist Dosing Inpatient   Does not apply q1800   Continuous Infusions: . sodium chloride     Principal Problem:   Left rib fracture Active Problems:   Essential hypertension   GERD (gastroesophageal reflux disease)   COPD with chronic bronchitis (HCC)   Factor V deficiency (HCC)   CKD (chronic kidney disease)   Hx pulmonary embolism   Rib fractures  Time spent:   Irwin Brakeman, MD, FAAFP [facility] Pager [phone]  If 7PM-7AM, please contact night-coverage [email or web address] Password [email or web address] 06/25/2017, 1:13 PM    LOS: 0 days

## 2017-06-25 NOTE — Care Management Obs Status (Signed)
[place] NOTIFICATION   Patient Details  Name: Nancy Hicks MRN: [id number] Date of Birth: [birth year]   Medicare Observation Status Notification Given:  Yes    Lenisha Lacap, Chauncey Reading, RN 06/25/2017, 8:51 AM

## 2017-06-25 NOTE — Progress Notes (Signed)
ANTICOAGULATION CONSULT NOTE - Initial Consult  Pharmacy Consult for warfarin Indication: VTE  Allergies  Allergen Reactions  . Metoclopramide Hcl Other (See Comments)    REACTION: causes uncontrolable leg movements  . Nitrofurantoin Nausea And Vomiting and Other (See Comments)    REACTION: dizziness    Patient Measurements: Height: 5' (152.4 cm) Weight: 130 lb (59 kg) IBW/kg (Calculated) : 45.5  Vital Signs: Temp: 97.7 F (36.5 C) (03/29 0549) Temp Source: Oral (03/29 0549) BP: 108/50 (03/29 0549) Pulse Rate: 61 (03/29 0549)  Labs: Recent Labs    06/24/17 1916 06/25/17 0450  HGB 12.6 11.9*  HCT 39.1 37.3  PLT 196 198  LABPROT 27.6*  --   INR 2.60  --   CREATININE 1.07* 1.20*    Estimated Creatinine Clearance: 25.5 mL/min (A) (by C-G formula based on SCr of 1.2 mg/dL (H)).   Medical History: Past Medical History:  Diagnosis Date  . Anemia, unspecified   . Anxiety state, unspecified   . Blood dyscrasia    RV factor def  . Cancer ([place])    skin  . Degeneration of cervical intervertebral disc   . Disorder of bone and cartilage, unspecified   . Diverticulosis of colon (without mention of hemorrhage) 01/13/1999  . Esophageal reflux   . Esophageal stricture   . Factor V deficiency ([place])   . Factor V deficiency ([place])   . Hiatal hernia 04/09/2010   ? Paraesophageal component on chest CT 2006  . HTN (hypertension)   . Irritable bowel syndrome   . Neurogenic bladder   . Osteoarthrosis, unspecified whether generalized or localized, unspecified site   . Peripheral vascular disease (HCC)    pe, dvt hx  . Personal history of thrombophlebitis   . Personal history of venous thrombosis and embolism   . 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 Pure hypercholesterolemia   . Raynaud phenomenon   . Shortness of breath dyspnea   . Unspecified asthma(493.90)   . Unspecified disorder resulting from impaired renal function    cysts    Medications:   Medications Prior to Admission  Medication Sig Dispense Refill Last Dose  . ADVAIR DISKUS 100-50 MCG/DOSE AEPB take 1 tablet twice a day (Patient taking differently: Inhale one puff by mouth daily in the morning) 60 each 11 06/24/2017 at Unknown time  . allopurinol (ZYLOPRIM) 300 MG tablet Take 300 mg by mouth daily.     06/24/2017 at Unknown time  . Biotin 5000 MCG CAPS Take 1 capsule by mouth daily.   06/24/2017 at Unknown time  . Calcium Citrate-Vitamin D (CITRACAL MAXIMUM) 315-250 MG-UNIT TABS Take 1 capsule by mouth daily.   06/24/2017 at Unknown time  . chlorthalidone (HYGROTON) 25 MG tablet Take 12.5 mg by mouth daily.   06/24/2017 at Unknown time  . Cholecalciferol (VITAMIN D3) 2000 UNITS TABS Take 1 tablet by mouth daily.     06/24/2017 at Unknown time  . DILT-XR 240 MG 24 hr capsule Take 240 mg by mouth daily.  0 06/24/2017 at Unknown time  . esomeprazole (NEXIUM) 40 MG capsule Take 40 mg by mouth daily before breakfast.    06/24/2017 at Unknown time  . fluticasone (FLONASE) 50 MCG/ACT nasal spray instill 2 sprays into each nostril once daily (Patient taking differently: instill 2 sprays into each nostril once daily as needed for allergies) 16 g 5 unknown  . furosemide (LASIX) 20 MG tablet Take 1 tablet (20 mg total) by mouth daily. 90 tablet 1 06/24/2017  at Unknown time  . losartan (COZAAR) 100 MG tablet take 1 tablet by mouth once daily 90 tablet 1 06/24/2017 at Unknown time  . methenamine (HIPREX) 1 G tablet Take 500 mg by mouth 2 (two) times daily with a meal.    06/24/2017 at Unknown time  . methylcellulose (CITRUCEL) oral powder Take 1 tsp by mouth daily    06/24/2017 at Unknown time  . Multiple Vitamin (MULITIVITAMIN WITH MINERALS) TABS Take 1 tablet by mouth daily.   06/24/2017 at Unknown time  . Polyethyl Glycol-Propyl Glycol (SYSTANE ULTRA PF) 0.4-0.3 % SOLN Place 2 drops into both eyes 3 (three) times daily.    06/24/2017 at Unknown time  . potassium chloride SA (K-DUR,KLOR-CON) 20 MEQ  tablet take 1 tablet by mouth once daily 90 tablet 0 06/24/2017 at Unknown time  . sertraline (ZOLOFT) 50 MG tablet Take 1 tablet (50 mg total) by mouth daily. 90 tablet 1 06/24/2017 at Unknown time  . simvastatin (ZOCOR) 10 MG tablet Take 10 mg by mouth at bedtime.     06/23/2017 at Unknown time  . VENTOLIN HFA 108 (90 BASE) MCG/ACT inhaler inhale 2 puffs by mouth every 6 hours AS NEEDED FOR WHEEZING 18 Inhaler 2 unknown  . warfarin (COUMADIN) 2.5 MG tablet Take 2.5 mg by mouth every evening. Takes 2.5 mg everyday.   06/23/2017 at 1800    Assessment: Patient with history of factor V deficiency and PE- taking coumadin. INR on admission is 2.6.  Goal of Therapy:  INR 2-3 Monitor platelets by anticoagulation protocol: Yes   Plan:  Warfarin 2.5 mg x 1 dose Monitor daily INR and s/s of bleeding  Margot Ables, PharmD Clinical Pharmacist 06/25/2017 7:46 AM

## 2017-06-25 NOTE — NC FL2 (Signed)
[place] MEDICAID FL2 LEVEL OF CARE SCREENING TOOL     IDENTIFICATION  Patient Name: Nancy Hicks Birthdate: 04-05-1926 Sex: female Admission Date (Current Location): 06/24/2017  [facility] and [place] Number:  [employer] and Address:  [facility] [address] [place]      Provider Number: [id number]  Attending Physician Name and Address:  Murlean Iba, MD  Relative Name and Phone Number:       Current Level of Care: Other (Comment)(obs) Recommended Level of Care: Nursing Facility Prior Approval Number:    Date Approved/Denied:   PASRR Number:    Discharge Plan: SNF    Current Diagnoses: Patient Active Problem List   Diagnosis Date Noted  . Left rib fracture 06/24/2017  . Rib fractures 06/24/2017  . History of DVT (deep vein thrombosis) 12/07/2016  . Acute pain of right shoulder   . Acute diverticulitis 01/07/2016  . Bright red rectal bleeding 01/07/2016  . Factor V deficiency ([place]) 01/07/2016  . CKD (chronic kidney disease) 01/07/2016  . Diverticulitis 01/07/2016  . Sigmoid diverticulitis 01/07/2016  . Diverticulitis of large intestine without perforation or abscess with bleeding   . 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 Hx pulmonary embolism 04/23/2015  . H/O superficial parotidectomy 11/14/2014  . Deep vein thrombosis of lower extremity ([place]) 07/27/2011  . Embolism, pulmonary with infarction ([place]) 07/27/2011  . Diverticulitis of colon (without mention of hemorrhage)(562.11) 06/09/2011  . GERD (gastroesophageal reflux disease) 06/09/2011  . Right rib fracture 06/09/2011  . Anticoagulant long-term use 06/09/2011  . COPD with chronic bronchitis ([place]) 06/09/2011  . HH (hiatus hernia) 11/04/2010  . Dysphagia 03/11/2010  . RENAL INSUFFICIENCY 04/02/2008  . IBS 01/24/2008  . HYPERCHOLESTEROLEMIA 03/14/2007  . ASTHMA 03/14/2007  . NEUROGENIC BLADDER 03/14/2007  . [place]  DISEASE, CERVICAL 03/14/2007  . DEEP VENOUS THROMBOPHLEBITIS, HX OF 03/14/2007  . ANEMIA 01/10/2007  . ANXIETY 01/10/2007  . Essential hypertension 01/10/2007  . Colon, diverticulosis 01/10/2007  . DEGENERATIVE JOINT DISEASE 01/10/2007  . OSTEOPENIA 01/10/2007    Orientation RESPIRATION BLADDER Height & Weight     Self, Time, Situation, Place  Normal Continent Weight: 130 lb (59 kg) Height:  5' (152.4 cm)  BEHAVIORAL SYMPTOMS/MOOD NEUROLOGICAL BOWEL NUTRITION STATUS      Continent Diet(Heart Healthy)  AMBULATORY STATUS COMMUNICATION OF NEEDS Skin   Limited Assist Verbally Normal                       Personal Care Assistance Level of Assistance  Bathing, Feeding, Dressing Bathing Assistance: Limited assistance Feeding assistance: Independent Dressing Assistance: Limited assistance     Functional Limitations Info  Sight, Hearing, Speech Sight Info: Adequate Hearing Info: Adequate Speech Info: Adequate    SPECIAL CARE FACTORS FREQUENCY  PT (By licensed PT)     PT Frequency: 5x/week              Contractures Contractures Info: Not present    Additional Factors Info  Code Status, Allergies, Psychotropic Code Status Info: Full Code Allergies Info: Metoclopramide Hcl, Nitrofurantoin Psychotropic Info: Zoloft         Current Medications (06/25/2017):  This is the current hospital active medication list Current Facility-Administered Medications  Medication Dose Route Frequency Provider Last Rate Last Dose  . 0.9 %  sodium chloride infusion  250 mL Intravenous PRN Manuella Ghazi, Pratik D, DO      . acetaminophen (TYLENOL) tablet 650  mg  650 mg Oral Q6H PRN Manuella Ghazi, Pratik D, DO       Or  . acetaminophen (TYLENOL) suppository 650 mg  650 mg Rectal Q6H PRN Manuella Ghazi, Pratik D, DO      . allopurinol (ZYLOPRIM) tablet 300 mg  300 mg Oral Daily Manuella Ghazi, Pratik D, DO   300 mg at 06/25/17 0819  . calcium-vitamin D (OSCAL WITH D) 500-200 MG-UNIT per tablet 1 tablet  1 tablet Oral Daily  Johnson, Clanford L, MD   1 tablet at 06/25/17 1114  . chlorthalidone (HYGROTON) tablet 12.5 mg  12.5 mg Oral Daily Manuella Ghazi, Pratik D, DO   12.5 mg at 06/25/17 1116  . cholecalciferol (VITAMIN D) tablet 2,000 Units  2,000 Units Oral Daily Heath Lark D, DO   2,000 Units at 06/25/17 [phone]  . diltiazem (CARDIZEM CD) 24 hr capsule 240 mg  240 mg Oral Daily Manuella Ghazi, Pratik D, DO   240 mg at 06/25/17 0817  . fluticasone (FLONASE) 50 MCG/ACT nasal spray 2 spray  2 spray Each Nare Daily Manuella Ghazi, Pratik D, DO   2 spray at 06/25/17 [id number]  . furosemide (LASIX) tablet 20 mg  20 mg Oral Daily Manuella Ghazi, Pratik D, DO   20 mg at 06/25/17 0819  . HYDROcodone-acetaminophen (NORCO/VICODIN) 5-325 MG per tablet 1-2 tablet  1-2 tablet Oral Q4H PRN Manuella Ghazi, Pratik D, DO   1 tablet at 06/25/17 1113  . ketorolac (TORADOL) 15 MG/ML injection 15 mg  15 mg Intravenous Q6H PRN Manuella Ghazi, Pratik D, DO   15 mg at 06/25/17 0028  . lidocaine (LIDODERM) 5 % 1 patch  1 patch Transdermal Q24H Manuella Ghazi, Pratik D, DO   1 patch at 06/25/17 0027  . losartan (COZAAR) tablet 100 mg  100 mg Oral Daily Manuella Ghazi, Pratik D, DO   100 mg at 06/25/17 0818  . mometasone-formoterol (DULERA) 100-5 MCG/ACT inhaler 2 puff  2 puff Inhalation BID Manuella Ghazi, Pratik D, DO      . multivitamin with minerals tablet 1 tablet  1 tablet Oral Daily Manuella Ghazi, Pratik D, DO   1 tablet at 06/25/17 [phone]  . ondansetron (ZOFRAN) tablet 4 mg  4 mg Oral Q6H PRN Manuella Ghazi, Pratik D, DO       Or  . ondansetron (ZOFRAN) injection 4 mg  4 mg Intravenous Q6H PRN Manuella Ghazi, Pratik D, DO      . pantoprazole (PROTONIX) EC tablet 40 mg  40 mg Oral Daily Manuella Ghazi, Pratik D, DO   40 mg at 06/25/17 0818  . polyvinyl alcohol (LIQUIFILM TEARS) 1.4 % ophthalmic solution 2 drop  2 drop Both Eyes TID Manuella Ghazi, Pratik D, DO   2 drop at 06/25/17 0810  . potassium chloride SA (K-DUR,KLOR-CON) CR tablet 20 mEq  20 mEq Oral Daily Manuella Ghazi, Pratik D, DO   20 mEq at 06/25/17 0817  . sertraline (ZOLOFT) tablet 50 mg  50 mg Oral Daily Manuella Ghazi, Pratik D, DO    50 mg at 06/25/17 0817  . simvastatin (ZOCOR) tablet 10 mg  10 mg Oral QHS Shah, Pratik D, DO   10 mg at 06/25/17 0040  . sodium chloride flush (NS) 0.9 % injection 3 mL  3 mL Intravenous Q12H Shah, Pratik D, DO   3 mL at 06/25/17 0821  . sodium chloride flush (NS) 0.9 % injection 3 mL  3 mL Intravenous PRN Manuella Ghazi, Pratik D, DO      . tiZANidine (ZANAFLEX) tablet 2 mg  2 mg Oral Q8H PRN Manuella Ghazi, Pratik D, DO  2 mg at 06/25/17 0028  . warfarin (COUMADIN) tablet 2.5 mg  2.5 mg Oral ONCE-1800 Johnson, Eldridge Dace, MD      . Warfarin - Pharmacist Dosing Inpatient   Does not apply q1800 Murlean Iba, MD         Discharge Medications: Please see discharge summary for a list of discharge medications.  Relevant Imaging Results:  Relevant Lab Results:   Additional Information SSN [id number], Clydene Pugh, LCSW

## 2017-06-25 NOTE — Plan of Care (Signed)
  Problem: Acute Rehab PT Goals(only PT should resolve) Goal: Pt Will Go Sit To Supine/Side Outcome: Progressing Flowsheets (Taken 06/25/2017 1432) Pt will go Sit to Supine/Side: with minimal assist Goal: Patient Will Transfer Sit To/From Stand Outcome: Progressing Flowsheets (Taken 06/25/2017 1432) Patient will transfer sit to/from stand: with minimal assist Goal: Pt Will Transfer Bed To Chair/Chair To Bed Outcome: Progressing Flowsheets (Taken 06/25/2017 1432) Pt will Transfer Bed to Chair/Chair to Bed: with min assist Goal: Pt Will Ambulate Outcome: Progressing Flowsheets (Taken 06/25/2017 1432) Pt will Ambulate: with minimal assist;with rolling walker;50 feet   2:32 PM, 06/25/17 Lonell Grandchild, MPT Physical Therapist with [facility] [phone] office [phone] mobile phone

## 2017-06-25 NOTE — Care Management Note (Signed)
Case Management Note  Patient Details  Name: Nancy Hicks MRN: [id number] Date of Birth: 25-Sep-1926  Subjective/Objective: Adm with rib fractures. From home alone, ind with ADL's. Walks with cane. Will work with PT today. We discuss Home health, if recommended she would want [facility].                    Action/Plan: CM following. [name] with home health.   Expected Discharge Date:  06/25/17               Expected Discharge Plan:  [name]  In-House Referral:     Discharge planning Services  CM Consult  Post Acute Care Choice:  Home Health Choice offered to:  Patient  DME Arranged:    DME Agency:     HH Arranged:  PT [place]:  [place]  Status of Service:  In process, will continue to follow  If discussed at Long Length of Stay Meetings, dates discussed:    Additional Comments:  Dedee Liss, Chauncey Reading, RN 06/25/2017, 9:10 AM

## 2017-06-25 NOTE — Evaluation (Signed)
Physical Therapy Evaluation Patient Details Name: Nancy Hicks MRN: [id number] DOB: [birth year] Today's Date: 06/25/2017   History of Present Illness  Nancy Hicks is a 84 y.o. female.  She had a fall getting out of the bathtub last evening.  She struck her head and her back.  Today she noticed worsening low back pain which became more severe after a car ride.  She is complaining of severe sharp stabbing left low back pain worse with movement improved with rest.  She is took some Tylenol with minimal improvement.  It is not associated with any numbness weakness or tingling in her lower extremities.  She is not having any chest pain or shortness of breath.  She denies any headache or blurry vision.  She has no prior history of significant back problems.    Clinical Impression  Patient limited for functional mobility as stated below secondary to severe pain left side low back with movement, taking steps, BLE weakness, fatigue and poor standing balance.  Patient will benefit from continued physical therapy in hospital and recommended venue below to increase strength, balance, endurance for safe ADLs and gait.    Follow Up Recommendations SNF    Equipment Recommendations  None recommended by PT    Recommendations for Other Services       Precautions / Restrictions Precautions Precautions: Fall Restrictions Weight Bearing Restrictions: No      Mobility  Bed Mobility Overal bed mobility: Needs Assistance Bed Mobility: Supine to Sit     Supine to sit: Mod assist     General bed mobility comments: demonstrates slow labored movement with c/o severe increase in pain left side low back  Transfers Overall transfer level: Needs assistance Equipment used: Rolling walker (2 wheeled) Transfers: Sit to/from [facility] Sit to Stand: Mod assist Stand pivot transfers: Mod assist       General transfer comment: slow labored movement, increased  pain  Ambulation/Gait Ambulation/Gait assistance: Mod assist Ambulation Distance (Feet): 10 Feet Assistive device: Rolling walker (2 wheeled) Gait Pattern/deviations: Decreased step length - right;Decreased step length - left;Decreased stride length   Gait velocity interpretation: Below normal speed for age/gender General Gait Details: slow labored cadence, frequent gesturing with LUE when having shooting pain in left side low back, worsened when taking steps with LLE  Stairs            Wheelchair Mobility    Modified Rankin (Stroke Patients Only)       Balance Overall balance assessment: Needs assistance Sitting-balance support: Feet supported;No upper extremity supported Sitting balance-Leahy Scale: Fair     Standing balance support: Bilateral upper extremity supported;During functional activity Standing balance-Leahy Scale: Fair Standing balance comment: with RW                             Pertinent Vitals/Pain Pain Assessment: Faces Faces Pain Scale: Hurts whole lot Pain Location: left side low back at rib cage area with movement  Pain Descriptors / Indicators: Sharp;Grimacing;Guarding;Discomfort Pain Intervention(s): Limited activity within patient's tolerance;Monitored during session;Premedicated before session    Home Living Family/patient expects to be discharged to:: Private residence Living Arrangements: Alone Available Help at Discharge: Family Type of Home: House Home Access: Stairs to enter Entrance Stairs-Rails: Right;Left;Can reach both Entrance Stairs-Number of Steps: 3 Home Layout: One level Home Equipment: Walker - 4 wheels;Cane - single point;Shower seat - built in;Hand held shower head      Prior Function Level  of Independence: Independent with assistive device(s)         Comments: Ambulates community distances with SPC, drives     Hand Dominance        Extremity/Trunk Assessment   Upper Extremity Assessment Upper  Extremity Assessment: Generalized weakness    Lower Extremity Assessment Lower Extremity Assessment: Generalized weakness    Cervical / Trunk Assessment Cervical / Trunk Assessment: Normal  Communication   Communication: No difficulties  Cognition Arousal/Alertness: Awake/alert Behavior During Therapy: WFL for tasks assessed/performed Overall Cognitive Status: Within Functional Limits for tasks assessed                                        General Comments      Exercises     Assessment/Plan    PT Assessment Patient needs continued PT services  PT Problem List Decreased strength;Decreased range of motion;Decreased balance;Decreased mobility       PT Treatment Interventions Gait training;Stair training;Functional mobility training;Therapeutic activities;Therapeutic exercise;Patient/family education    PT Goals (Current goals can be found in the Care Plan section)  Acute Rehab PT Goals Patient Stated Goal: return home after rehab PT Goal Formulation: With patient/family Time For Goal Achievement: 07/09/17 Potential to Achieve Goals: Good    Frequency Min 3X/week   Barriers to discharge        Co-evaluation               AM-PAC PT "6 Clicks" Daily Activity  Outcome Measure Difficulty turning over in bed (including adjusting bedclothes, sheets and blankets)?: A Lot Difficulty moving from lying on back to sitting on the side of the bed? : A Lot Difficulty sitting down on and standing up from a chair with arms (e.g., wheelchair, bedside commode, etc,.)?: A Lot Help needed moving to and from a bed to chair (including a wheelchair)?: A Lot Help needed walking in hospital room?: A Lot Help needed climbing 3-5 steps with a railing? : Total 6 Click Score: 11    End of Session   Activity Tolerance: Patient limited by pain;Patient tolerated treatment well;Patient limited by fatigue Patient left: in chair;with call bell/phone within reach;with  family/visitor present Nurse Communication: Mobility status PT Visit Diagnosis: Unsteadiness on feet (R26.81);Other abnormalities of gait and mobility (R26.89);Muscle weakness (generalized) (M62.81)    Time: 7711-6579 PT Time Calculation (min) (ACUTE ONLY): 29 min   Charges:   PT Evaluation $PT Eval Moderate Complexity: 1 Mod PT Treatments $Therapeutic Activity: 23-37 mins   PT G Codes:        2:30 PM, Jul 11, 2017 Nancy Hicks, MPT Physical Therapist with [facility] [phone] office [phone] mobile phone

## 2017-06-26 DIAGNOSIS — S2242XD Multiple fractures of ribs, left side, subsequent encounter for fracture with routine healing: Secondary | ICD-10-CM | POA: Diagnosis not present

## 2017-06-26 DIAGNOSIS — I10 Essential (primary) hypertension: Secondary | ICD-10-CM | POA: Diagnosis not present

## 2017-06-26 DIAGNOSIS — J449 Chronic obstructive pulmonary disease, unspecified: Secondary | ICD-10-CM | POA: Diagnosis not present

## 2017-06-26 DIAGNOSIS — N189 Chronic kidney disease, unspecified: Secondary | ICD-10-CM | POA: Diagnosis not present

## 2017-06-26 LAB — PROTIME-INR
INR: 2.24
PROTHROMBIN TIME: 24.6 s — AB (ref 11.4–15.2)

## 2017-06-26 MED ORDER — WARFARIN SODIUM 2.5 MG PO TABS
2.5000 mg | ORAL_TABLET | Freq: Once | ORAL | Status: AC
  Administered 2017-06-26: 2.5 mg via ORAL
  Filled 2017-06-26: qty 1

## 2017-06-26 MED ORDER — SENNOSIDES-DOCUSATE SODIUM 8.6-50 MG PO TABS
1.0000 | ORAL_TABLET | Freq: Two times a day (BID) | ORAL | Status: DC
  Administered 2017-06-26 – 2017-06-28 (×5): 1 via ORAL
  Filled 2017-06-26 (×5): qty 1

## 2017-06-26 MED ORDER — WARFARIN - PHARMACIST DOSING INPATIENT
Status: DC
  Administered 2017-06-28: 16:00:00

## 2017-06-26 NOTE — Progress Notes (Signed)
[place] for warfarin Indication: VTE  Allergies  Allergen Reactions  . Metoclopramide Hcl Other (See Comments)    REACTION: causes uncontrolable leg movements  . Nitrofurantoin Nausea And Vomiting and Other (See Comments)    REACTION: dizziness   Patient Measurements: Height: 5' (152.4 cm) Weight: 130 lb (59 kg) IBW/kg (Calculated) : 45.5  Vital Signs: Temp: 98.2 F (36.8 C) (03/30 0548) Temp Source: Oral (03/30 0548) BP: 114/52 (03/30 0548) Pulse Rate: 64 (03/30 0548)  Labs: Recent Labs    06/24/17 1916 06/25/17 0450 06/26/17 0605  HGB 12.6 11.9*  --   HCT 39.1 37.3  --   PLT 196 198  --   LABPROT 27.6*  --  24.6*  INR 2.60  --  2.24  CREATININE 1.07* 1.20*  --    Estimated Creatinine Clearance: 25.5 mL/min (A) (by C-G formula based on SCr of 1.2 mg/dL (H)).  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 INR on admission is 2.6.  Home warfarin 2.5mg  daily.  Goal of Therapy:  INR 2-3 Monitor platelets by anticoagulation protocol: Yes   Plan:  Warfarin 2.5 mg x 1 dose Monitor daily INR and s/s of bleeding  Pricilla Larsson, [facility] 06/26/2017 2:08 PM

## 2017-06-26 NOTE — Plan of Care (Signed)
progressing 

## 2017-06-26 NOTE — Progress Notes (Signed)
PROGRESS NOTE  Nancy Hicks  BJS:[id number]  DOB: 1926/07/24  DOA: 06/24/2017 PCP: Asencion Noble, MD  Brief Admission Hx:  Nancy Hicks is a 84 y.o. 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  She states that she simply slipped and fell out of her bathtub and did not have any lightheadedness, dizziness, or loss of consciousness surrounding the event.   MDM/Assessment & Plan:   1. Left acute nondisplaced rib fractures of ninth and 10th rib status post mechanical fall.  Patient has severe pain that is not controlled with oral medications at this time and she cannot sit up or ambulate on account of her pain for which she warrants observation and pain control with physical therapy evaluation for rib belt.  Continue Toradol as well as oral narcotics, lidoderm patch, and some muscle relaxers to see if the multimodal approach will help her pain control. IS.   PT recommending SNF.    2. Mild hyponatremia.  Resolved now.  Likely related to diuretic use.  Continue on current medications.  She appears euvolemic and does not appear symptomatic from this. 3. COPD.  Continue home Advair with breathing treatments as needed for wheezing or shortness of breath. 4. 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  Continue Coumadin with pharmacy to help follow.  INR is currently therapeutic. 5. CKD stage III.  Currently at baseline.  Continue to monitor. 6. Hypertension.  Continue home medications. Heart healthy diet. 7. GERD.  Continue on Protonix.  DVT prophylaxis: Coumadin Code Status: Full Family Communication: Daughter at bedside Disposition Plan: awaiting SNF bed pt unable to ambulate without multiple assist   Subjective: Pt awaiting SNF. Pt has pain with any movement.  She is using incentive  spirometer.   Objective: Vitals:   06/25/17 2018 06/25/17 2100 06/26/17 0548 06/26/17 0951  BP:  (!) 123/42 (!) 114/52   Pulse:  79 64   Resp:  18 18   Temp:  (!) 97.5 F (36.4 C) 98.2 F (36.8 C)   TempSrc:  Oral Oral   SpO2: 96% 94% 96% 91%  Weight:      Height:        Intake/Output Summary (Last 24 hours) at 06/26/2017 1426 Last data filed at 06/26/2017 0500 Gross per 24 hour  Intake -  Output 75 ml  Net -75 ml   Filed Weights   06/24/17 1806  Weight: 59 kg (130 lb)   REVIEW OF SYSTEMS  As per history otherwise all reviewed and reported negative  Exam:  General exam: awake, alert, NAD cooperative.   Respiratory system: shallow breathing due to rib pain. No increased work of breathing. Cardiovascular system: S1 & S2 heard. No JVD, murmurs, gallops, clicks or pedal edema. Gastrointestinal system: Abdomen is nondistended, soft and nontender. Normal bowel sounds heard. Central nervous system: Alert and oriented. No focal neurological deficits. Extremities: no CCE.  Data Reviewed: Basic Metabolic Panel: Recent Labs  Lab 06/24/17 1916 06/25/17 0450  NA 133* 136  K 3.8 4.5  CL 100* 101  CO2 22 24  GLUCOSE 118* 116*  BUN 33* 33*  CREATININE 1.07* 1.20*  CALCIUM 9.6 9.3   Liver Function Tests: No results for input(s): AST, ALT, ALKPHOS, BILITOT, PROT, ALBUMIN in the last 168 hours. No results for input(s): LIPASE, AMYLASE in the last 168 hours.  No results for input(s): AMMONIA in the last 168 hours. CBC: Recent Labs  Lab 06/24/17 1916 06/25/17 0450  WBC 5.9 5.4  NEUTROABS 4.1  --   HGB 12.6 11.9*  HCT 39.1 37.3  MCV 96.3 97.9  PLT 196 198   Cardiac Enzymes: No results for input(s): CKTOTAL, CKMB, CKMBINDEX, TROPONINI in the last 168 hours. CBG (last 3)  No results for input(s): GLUCAP in the last 72 hours. No results found for this or any previous visit (from the past 240 hour(s)).   Studies: Ct Abdomen Pelvis Wo Contrast  Result Date:  06/24/2017 CLINICAL DATA:  [place] at home.  Left-sided pain. EXAM: CT ABDOMEN AND PELVIS WITHOUT CONTRAST TECHNIQUE: Multidetector CT imaging of the abdomen and pelvis was performed following the standard protocol without IV contrast. COMPARISON:  01/07/2016 FINDINGS: Lower chest: Large hiatal hernia containing the stomach and mesentery. No sign of obstruction or torsion. Compressive atelectasis in the left lower lobe. Left lower lobe pneumonia not excluded. Hepatobiliary: Liver parenchyma is normal. Previous cholecystectomy. Pancreas: Normal Spleen: Normal Adrenals/Urinary Tract: Adrenal glands are normal. Some chronic renal atrophy. Chronic renal cysts, some of which are hyperdense. Mild chronic fullness of the left renal collecting system an ureter scratch mild chronic fullness of the renal collecting systems and ureters without evidence acute obstruction. The bladder is somewhat distended, possibly responsible for that finding. Stomach/Bowel: No acute bowel finding. Large hiatal hernia containing the stomach as noted above. No sign of bowel obstruction. No sign of inflammatory disease in the abdomen. Diverticulosis without evidence of diverticulitis. Vascular/Lymphatic: Aortic atherosclerosis. No aneurysm. IVC is normal. No retroperitoneal adenopathy. Reproductive: No pelvic mass. Other: No free fluid or air. Musculoskeletal: Curvature in chronic degenerative changes throughout the lumbar region. No acute bone finding. IMPRESSION: No acute or traumatic finding. Large chronic hiatal hernia containing most of the stomach. Compressive atelectasis in the left lower lobe. Cannot rule out left base pneumonia. No acute abdominal or pelvic finding. Multiple renal cysts, some hyperdense. Diverticulosis without evidence of diverticulitis. Aortic atherosclerosis. Curvature and chronic degenerative changes of the spine. Electronically Signed   By: Nelson Chimes M.D.   On: 06/24/2017 20:54   Ct Head Wo Contrast  Result  Date: 06/24/2017 CLINICAL DATA:  [place] at home last night. EXAM: CT HEAD WITHOUT CONTRAST TECHNIQUE: Contiguous axial images were obtained from the base of the skull through the vertex without intravenous contrast. COMPARISON:  12/22/2006 FINDINGS: Brain: Age related atrophy. Chronic small-vessel ischemic changes of the white matter. No sign of acute infarction, mass lesion, hemorrhage, hydrocephalus or extra-axial collection. Vascular: There is atherosclerotic calcification of the major vessels at the base of the brain. Skull: Negative Sinuses/Orbits: Clear/normal Other: None IMPRESSION: No acute or traumatic finding. Age related atrophy and chronic small-vessel ischemic changes. Electronically Signed   By: Nelson Chimes M.D.   On: 06/24/2017 20:48   Ct L-spine No Charge  Result Date: 06/24/2017 CLINICAL DATA:  Thoracic spine pain after fall last night. EXAM: CT LUMBAR SPINE WITHOUT CONTRAST TECHNIQUE: Multidetector CT imaging of the lumbar spine was performed without intravenous contrast administration. Multiplanar CT image reconstructions were also generated. COMPARISON:  None. FINDINGS: SEGMENTATION: For the purposes of this report the last well-formed intervertebral disc space is reported as L5-S1. ALIGNMENT: Maintained lumbar lordosis. Minimal grade 1 T12-L1 through L3-4 retrolisthesis. No spondylolysis. Broad levoscoliosis on this nonweightbearing examination. VERTEBRAE: Vertebral bodies and posterior elements are intact. Severe T12-L1 through L3-4 and L5-S1 disc height loss with vacuum disc, endplate sclerosis and marginal  spurring compatible with degenerative discs. Moderate to severe L4-5 degenerative disc. Multilevel severe facet arthropathy. Osteopenia without destructive bony lesions. Acute LEFT ninth and tenth nondisplaced rib fractures. PARASPINAL AND OTHER SOFT TISSUES: Please see CT of abdomen and pelvis from same day, reported separately. DISC LEVELS: L1-2: Retrolisthesis. Endplate spurring. Mild  canal stenosis. Severe RIGHT and moderate to severe LEFT neural foraminal narrowing. L2-3: Retrolisthesis. Endplate spurring. Severe RIGHT facet arthropathy. Mild canal stenosis. Moderate RIGHT neural foraminal narrowing. L3-4: Retrolisthesis. Endplate spurring with mild to moderate facet arthropathy. Mild canal stenosis. Severe RIGHT greater than LEFT neural foraminal narrowing. L4-5: Small broad-based disc osteophyte complex. Moderate to severe facet arthropathy. No canal stenosis. Severe RIGHT, moderate to severe LEFT neural foraminal narrowing. L5-S1: Small broad-based disc osteophyte complex. Severe facet arthropathy. No canal stenosis. Moderate RIGHT, severe LEFT neural foraminal narrowing. IMPRESSION: 1. No acute lumbar spine fracture. Multilevel retrolisthesis on degenerative basis. 2. Acute nondisplaced LEFT ninth and tenth rib fractures. 3. Mild canal stenosis L1-2 through L3-4. Multilevel severe neural foraminal narrowing. Electronically Signed   By: Elon Alas M.D.   On: 06/24/2017 20:53   Scheduled Meds: . allopurinol  300 mg Oral Daily  . calcium-vitamin D  1 tablet Oral Daily  . chlorthalidone  12.5 mg Oral Daily  . cholecalciferol  2,000 Units Oral Daily  . diltiazem  240 mg Oral Daily  . fluticasone  2 spray Each Nare Daily  . furosemide  20 mg Oral Daily  . lidocaine  1 patch Transdermal Q24H  . losartan  100 mg Oral Daily  . mometasone-formoterol  2 puff Inhalation BID  . multivitamin with minerals  1 tablet Oral Daily  . pantoprazole  40 mg Oral Daily  . polyvinyl alcohol  2 drop Both Eyes TID  . potassium chloride SA  20 mEq Oral Daily  . sertraline  50 mg Oral Daily  . simvastatin  10 mg Oral QHS  . sodium chloride flush  3 mL Intravenous Q12H  . warfarin  2.5 mg Oral Once  . [START ON 06/27/2017] Warfarin - Pharmacist Dosing Inpatient   Does not apply Q24H   Continuous Infusions: . sodium chloride     Principal Problem:   Left rib fracture Active Problems:    Essential hypertension   GERD (gastroesophageal reflux disease)   COPD with chronic bronchitis (HCC)   Factor V deficiency (HCC)   CKD (chronic kidney disease)   Hx pulmonary embolism   Rib fractures  Time spent:   Irwin Brakeman, MD, FAAFP [facility] Pager [phone]  If 7PM-7AM, please contact night-coverage [email or web address] Password [email or web address] 06/26/2017, 2:26 PM    LOS: 0 days

## 2017-06-27 DIAGNOSIS — I10 Essential (primary) hypertension: Secondary | ICD-10-CM | POA: Diagnosis not present

## 2017-06-27 DIAGNOSIS — J449 Chronic obstructive pulmonary disease, unspecified: Secondary | ICD-10-CM | POA: Diagnosis not present

## 2017-06-27 DIAGNOSIS — N189 Chronic kidney disease, unspecified: Secondary | ICD-10-CM | POA: Diagnosis not present

## 2017-06-27 DIAGNOSIS — S2242XD Multiple fractures of ribs, left side, subsequent encounter for fracture with routine healing: Secondary | ICD-10-CM | POA: Diagnosis not present

## 2017-06-27 LAB — CBC
HCT: 37.8 % (ref 36.0–46.0)
Hemoglobin: 12.1 g/dL (ref 12.0–15.0)
MCH: 31.3 pg (ref 26.0–34.0)
MCHC: 32 g/dL (ref 30.0–36.0)
MCV: 97.7 fL (ref 78.0–100.0)
PLATELETS: 202 10*3/uL (ref 150–400)
RBC: 3.87 MIL/uL (ref 3.87–5.11)
RDW: 15 % (ref 11.5–15.5)
WBC: 6.1 10*3/uL (ref 4.0–10.5)

## 2017-06-27 LAB — GLUCOSE, CAPILLARY: Glucose-Capillary: 133 mg/dL — ABNORMAL HIGH (ref 65–99)

## 2017-06-27 LAB — PROTIME-INR
INR: 2.2
Prothrombin Time: 24.2 seconds — ABNORMAL HIGH (ref 11.4–15.2)

## 2017-06-27 MED ORDER — WARFARIN SODIUM 2.5 MG PO TABS
2.5000 mg | ORAL_TABLET | Freq: Once | ORAL | Status: AC
  Administered 2017-06-27: 2.5 mg via ORAL
  Filled 2017-06-27: qty 1

## 2017-06-27 NOTE — Plan of Care (Signed)
progressing 

## 2017-06-27 NOTE — Progress Notes (Signed)
[place] for warfarin Indication: VTE  Allergies  Allergen Reactions  . Metoclopramide Hcl Other (See Comments)    REACTION: causes uncontrolable leg movements  . Nitrofurantoin Nausea And Vomiting and Other (See Comments)    REACTION: dizziness   Patient Measurements: Height: 5' (152.4 cm) Weight: 130 lb (59 kg) IBW/kg (Calculated) : 45.5  Vital Signs: Temp: 98.3 F (36.8 C) (03/31 0500) Temp Source: Oral (03/31 0500) BP: 120/42 (03/31 0500) Pulse Rate: 72 (03/31 0500)  Labs: Recent Labs    06/24/17 1916 06/25/17 0450 06/26/17 0605 06/27/17 0505  HGB 12.6 11.9*  --  12.1  HCT 39.1 37.3  --  37.8  PLT 196 198  --  202  LABPROT 27.6*  --  24.6* 24.2*  INR 2.60  --  2.24 2.20  CREATININE 1.07* 1.20*  --   --    Estimated Creatinine Clearance: 25.5 mL/min (A) (by C-G formula based on SCr of 1.2 mg/dL (H)).  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 INR @ goal.  Home warfarin 2.5mg  daily.  Goal of Therapy:  INR 2-3 Monitor platelets by anticoagulation protocol: Yes   Plan:  Warfarin 2.5 mg x 1 dose Monitor daily INR and s/s of bleeding  Pricilla Larsson, [facility] 06/27/2017 9:57 AM

## 2017-06-27 NOTE — Progress Notes (Signed)
Patient refused to walk in her room.  Stated it hurts too much to move.  Explained she needs to get up and move so she does not develop pneumonia.

## 2017-06-27 NOTE — Progress Notes (Signed)
PROGRESS NOTE  Nancy Hicks  DJT:[id number]  DOB: [birth year]  DOA: 06/24/2017 PCP: Asencion Noble, MD  Brief Admission Hx:  Nancy Hicks is a 84 y.o. 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  She states that she simply slipped and fell out of her bathtub and did not have any lightheadedness, dizziness, or loss of consciousness surrounding the event.   MDM/Assessment & Plan:   1. Left acute nondisplaced rib fractures of ninth and 10th rib status post mechanical fall.  Patient has severe pain that is improving and better controlled.   Continue Toradol as well as oral narcotics, lidoderm patch, and some muscle relaxers to see if the multimodal approach will help her pain control. IS.   PT recommending SNF.  Ambulate in room.  Up in chair.    2. Mild hyponatremia.  Resolved now.  Likely related to diuretic use.  Continue on current medications.  She appears euvolemic and does not appear symptomatic from this. 3. COPD.  Continue home Advair with breathing treatments as needed for wheezing or shortness of breath. 4. 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  Continue Coumadin with pharmacy to help follow.  INR is currently therapeutic. 5. CKD stage III.  Currently at baseline.  Continue to monitor. 6. Hypertension.  Continue home medications. Heart healthy diet. 7. GERD.  Continue on Protonix.  DVT prophylaxis: Coumadin Code Status: Full Family Communication: Daughter at bedside Disposition Plan: DC SNF 4/1   Subjective: Pt was able to sit up in chair.   She is still not ambulating well.  Needs SNF rehab.    Objective: Vitals:   06/26/17 1941 06/26/17 2300 06/27/17 0500 06/27/17 0807  BP:  (!) 130/52 (!) 120/42   Pulse:  80 72   Resp:  19 18   Temp:  98.5 F (36.9 C) 98.3 F  (36.8 C)   TempSrc:  Oral Oral   SpO2: 96% 93% 98% 91%  Weight:      Height:        Intake/Output Summary (Last 24 hours) at 06/27/2017 3009 Last data filed at 06/26/2017 2021 Gross per 24 hour  Intake 480 ml  Output 450 ml  Net 30 ml   Filed Weights   06/24/17 1806  Weight: 59 kg (130 lb)   REVIEW OF SYSTEMS  As per history otherwise all reviewed and reported negative  Exam:  General exam: awake, alert, NAD cooperative.   Respiratory system: shallow breathing due to rib pain. No increased work of breathing. Cardiovascular system: S1 & S2 heard. No JVD, murmurs, gallops, clicks or pedal edema. Gastrointestinal system: Abdomen is nondistended, soft and nontender. Normal bowel sounds heard. Central nervous system: Alert and oriented. No focal neurological deficits. Extremities: no CCE.  Data Reviewed: Basic Metabolic Panel: Recent Labs  Lab 06/24/17 1916 06/25/17 0450  NA 133* 136  K 3.8 4.5  CL 100* 101  CO2 22 24  GLUCOSE 118* 116*  BUN 33* 33*  CREATININE 1.07* 1.20*  CALCIUM 9.6 9.3   Liver Function Tests: No results for input(s): AST, ALT, ALKPHOS, BILITOT, PROT, ALBUMIN in the last 168 hours. No results for input(s): LIPASE, AMYLASE in the last 168 hours. No results for input(s): AMMONIA in the last 168 hours. CBC: Recent Labs  Lab 06/24/17 1916 06/25/17 0450 06/27/17 0505  WBC  5.9 5.4 6.1  NEUTROABS 4.1  --   --   HGB 12.6 11.9* 12.1  HCT 39.1 37.3 37.8  MCV 96.3 97.9 97.7  PLT 196 198 202   Cardiac Enzymes: No results for input(s): CKTOTAL, CKMB, CKMBINDEX, TROPONINI in the last 168 hours. CBG (last 3)  Recent Labs    06/27/17 0819  GLUCAP 133*   No results found for this or any previous visit (from the past 240 hour(s)).   Studies: No results found. Scheduled Meds: . allopurinol  300 mg Oral Daily  . calcium-vitamin D  1 tablet Oral Daily  . chlorthalidone  12.5 mg Oral Daily  . cholecalciferol  2,000 Units Oral Daily  . diltiazem   240 mg Oral Daily  . fluticasone  2 spray Each Nare Daily  . furosemide  20 mg Oral Daily  . lidocaine  1 patch Transdermal Q24H  . losartan  100 mg Oral Daily  . mometasone-formoterol  2 puff Inhalation BID  . multivitamin with minerals  1 tablet Oral Daily  . pantoprazole  40 mg Oral Daily  . polyvinyl alcohol  2 drop Both Eyes TID  . potassium chloride SA  20 mEq Oral Daily  . senna-docusate  1 tablet Oral BID  . sertraline  50 mg Oral Daily  . simvastatin  10 mg Oral QHS  . sodium chloride flush  3 mL Intravenous Q12H  . Warfarin - Pharmacist Dosing Inpatient   Does not apply Q24H   Continuous Infusions: . sodium chloride     Principal Problem:   Left rib fracture Active Problems:   Essential hypertension   GERD (gastroesophageal reflux disease)   COPD with chronic bronchitis (HCC)   Factor V deficiency (HCC)   CKD (chronic kidney disease)   Hx pulmonary embolism   Rib fractures  Time spent:   Irwin Brakeman, MD, FAAFP [facility] Pager [phone]  If 7PM-7AM, please contact night-coverage [email or web address] Password [email or web address] 06/27/2017, 9:26 AM    LOS: 0 days

## 2017-06-28 DIAGNOSIS — K219 Gastro-esophageal reflux disease without esophagitis: Secondary | ICD-10-CM | POA: Diagnosis not present

## 2017-06-28 DIAGNOSIS — J449 Chronic obstructive pulmonary disease, unspecified: Secondary | ICD-10-CM | POA: Diagnosis not present

## 2017-06-28 DIAGNOSIS — S2242XD Multiple fractures of ribs, left side, subsequent encounter for fracture with routine healing: Secondary | ICD-10-CM | POA: Diagnosis not present

## 2017-06-28 DIAGNOSIS — I10 Essential (primary) hypertension: Secondary | ICD-10-CM | POA: Diagnosis not present

## 2017-06-28 DIAGNOSIS — N183 Chronic kidney disease, stage 3 (moderate): Secondary | ICD-10-CM

## 2017-06-28 DIAGNOSIS — D682 Hereditary deficiency of other clotting factors: Secondary | ICD-10-CM | POA: Diagnosis not present

## 2017-06-28 LAB — PROTIME-INR
INR: 2.07
Prothrombin Time: 23.1 seconds — ABNORMAL HIGH (ref 11.4–15.2)

## 2017-06-28 LAB — GLUCOSE, CAPILLARY: GLUCOSE-CAPILLARY: 140 mg/dL — AB (ref 65–99)

## 2017-06-28 MED ORDER — HYDROCODONE-ACETAMINOPHEN 5-325 MG PO TABS: 1.0000 | ORAL_TABLET | ORAL | 0 refills | Status: DC | PRN

## 2017-06-28 MED ORDER — WARFARIN SODIUM 2.5 MG PO TABS
2.5000 mg | ORAL_TABLET | Freq: Once | ORAL | Status: AC
  Administered 2017-06-28: 2.5 mg via ORAL
  Filled 2017-06-28: qty 1

## 2017-06-28 MED ORDER — TIZANIDINE HCL 2 MG PO TABS: 2.0000 mg | ORAL_TABLET | Freq: Three times a day (TID) | ORAL | 0 refills | Status: DC | PRN

## 2017-06-28 MED ORDER — LIDOCAINE 5 % EX PTCH: 1.0000 | MEDICATED_PATCH | CUTANEOUS | 0 refills | Status: DC

## 2017-06-28 MED ORDER — SENNOSIDES-DOCUSATE SODIUM 8.6-50 MG PO TABS: 1.0000 | ORAL_TABLET | Freq: Two times a day (BID) | ORAL | Status: DC

## 2017-06-28 NOTE — Progress Notes (Signed)
Report called to [facility]. 

## 2017-06-28 NOTE — Progress Notes (Signed)
[place] for warfarin Indication: VTE  Allergies  Allergen Reactions  . Metoclopramide Hcl Other (See Comments)    REACTION: causes uncontrolable leg movements  . Nitrofurantoin Nausea And Vomiting and Other (See Comments)    REACTION: dizziness   Patient Measurements: Height: 5' (152.4 cm) Weight: 130 lb (59 kg) IBW/kg (Calculated) : 45.5  Vital Signs: Temp: 97.7 F (36.5 C) (04/01 0500) Temp Source: Oral (04/01 0500) BP: 118/52 (04/01 0500) Pulse Rate: 73 (04/01 0500)  Labs: Recent Labs    06/26/17 0605 06/27/17 0505 06/28/17 0631  HGB  --  12.1  --   HCT  --  37.8  --   PLT  --  202  --   LABPROT 24.6* 24.2* 23.1*  INR 2.24 2.20 2.07   Estimated Creatinine Clearance: 25.5 mL/min (A) (by C-G formula based on SCr of 1.2 mg/dL (H)).  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 INR @ goal.  Home warfarin 2.5mg  daily.  Goal of Therapy:  INR 2-3 Monitor platelets by anticoagulation protocol: Yes   Plan:  Warfarin 2.5 mg x 1 dose Monitor daily INR and s/s of bleeding  Isac Sarna, BS [place], [place] Clinical Pharmacist Pager [phone] 06/28/2017 12:27 PM

## 2017-06-28 NOTE — Progress Notes (Signed)
Patient transferred from unit via stretcher by EMS, family at bedside

## 2017-06-28 NOTE — Clinical Social Work Placement (Signed)
   CLINICAL SOCIAL WORK PLACEMENT  NOTE  Date:  06/28/2017  Patient Details  Name: Nancy Hicks MRN: [id number] Date of Birth: 1927-01-12  Clinical Social Work is seeking post-discharge placement for this patient at the [place] level of care (*CSW will initial, date and re-position this form in  chart as items are completed):  Yes   Patient/family provided with [place] Work Department's list of facilities offering this level of care within the geographic area requested by the patient (or if unable, by the patient's family).  Yes   Patient/family informed of their freedom to choose among providers that offer the needed level of care, that participate in Medicare, Medicaid or managed care program needed by the patient, have an available bed and are willing to accept the patient.  Yes   Patient/family informed of [facility]'s ownership interest in [facility] and [facility], as well as of the fact that they are under no obligation to receive care at these facilities.  PASRR submitted to EDS on 06/25/17     PASRR number received on 06/25/17     Existing PASRR number confirmed on       FL2 transmitted to all facilities in geographic area requested by pt/family on 06/25/17     FL2 transmitted to all facilities within larger geographic area on       Patient informed that his/her managed care company has contracts with or will negotiate with certain facilities, including the following:        Yes   Patient/family informed of bed offers received.  Patient [facility] bed at [facility]     Physician recommends and patient chooses bed at      Patient to be transferred to [facility] on 06/28/17.  Patient to be transferred to facility by [facility]     Patient family notified on 06/28/17 of transfer.  Name of family member notified:  daughter     PHYSICIAN       Additional Comment:  LCSW signing off.    _______________________________________________ Ihor Gully, LCSW 06/28/2017, 3:44 PM

## 2017-06-28 NOTE — Discharge Summary (Signed)
Physician Discharge Summary  GORGEOUS NEWLUN TFT:[id number] DOB: 11-Nov-1926 DOA: 06/24/2017  PCP: Asencion Noble, MD  Admit date: 06/24/2017 Discharge date: 06/28/2017  Admitted From: home Disposition:  Skilled nursing facility  Recommendations for Outpatient Follow-up:  1. Follow up with PCP in 1-2 weeks 2. Please obtain BMP/CBC in one week 3. Losartan, lasix and chlorthalidone held due to mild bump in creatinine. Recheck labs in one week and if creatinine stable, consider resuming these medications  Home Health: Equipment/Devices:  Discharge Condition:stable CODE STATUS:full code Diet recommendation: Heart Healthy   Brief/Interim Summary: Nancy F Harrelsonis a 84 y.o.femalewith medical history significant forCKD, hypertension, COPD, GERD,andfactor V deficiency with prior PE on Coumadin, who presents to the emergency department after a fall getting out of the bathtub yesterday evening. She states that she simply slipped and fell out of her bathtub and did not have any lightheadedness, dizziness, or loss of consciousness surrounding the event.     Discharge Diagnoses:  Principal Problem:   Left rib fracture Active Problems:   Essential hypertension   GERD (gastroesophageal reflux disease)   COPD with chronic bronchitis (HCC)   Factor V deficiency (HCC)   CKD (chronic kidney disease)   Hx pulmonary embolism   Rib fractures  1. Acute nondisplaced rib fractures of left 9th and 10th ribs s/p mechanical fall.  Patient reports that she continues to have pain, but it is improving and better controlled.  Continue on oral narcotics, Lidoderm patch, muscle relaxants.  Would avoid NSAIDs since she had a mild bump in creatinine.  Seen by physical therapy recommended skilled nursing facility placement. 2. Mild hyponatremia.  Related to diuretic use.  Diuretics have since been held.  Follow-up sodium was better. 3. COPD.  Continue on home Advair.  As needed bronchodilators. 4. Factor  V deficiency with prior PE on chronic anticoagulation.  Continue on Coumadin per pharmacy. 5. Chronic kidney disease stage III.  Creatinine is currently near baseline.  Recheck in 1 week.  Continue to monitor. 6. Hypertension.  Losartan, chlorthalidone and Lasix currently on hold due to mild bump in creatinine.  Restart in 1 week if labs are stable. 7. GERD.  Continue on PPI  Discharge Instructions  Discharge Instructions    Diet - low sodium heart healthy   Complete by:  As directed    Increase activity slowly   Complete by:  As directed      Allergies as of 06/28/2017      Reactions   Metoclopramide Hcl Other (See Comments)   REACTION: causes uncontrolable leg movements   Nitrofurantoin Nausea And Vomiting, Other (See Comments)   REACTION: dizziness      Medication List    STOP taking these medications   chlorthalidone 25 MG tablet Commonly known as:  HYGROTON   furosemide 20 MG tablet Commonly known as:  LASIX   losartan 100 MG tablet Commonly known as:  COZAAR   potassium chloride SA 20 MEQ tablet Commonly known as:  K-DUR,KLOR-CON     TAKE these medications   ADVAIR DISKUS 100-50 MCG/DOSE Aepb Generic drug:  Fluticasone-Salmeterol take 1 tablet twice a day What changed:    how much to take  how to take this  when to take this   allopurinol 300 MG tablet Commonly known as:  ZYLOPRIM Take 300 mg by mouth daily.   Biotin 5000 MCG Caps Take 1 capsule by mouth daily.   CITRACAL MAXIMUM 315-250 MG-UNIT Tabs Generic drug:  Calcium Citrate-Vitamin D Take 1 capsule by  mouth daily.   CITRUCEL oral powder Generic drug:  methylcellulose Take 1 tsp by mouth daily   DILT-XR 240 MG 24 hr capsule Generic drug:  diltiazem Take 240 mg by mouth daily.   esomeprazole 40 MG capsule Commonly known as:  NEXIUM Take 40 mg by mouth daily before breakfast.   fluticasone 50 MCG/ACT nasal spray Commonly known as:  FLONASE instill 2 sprays into each nostril once  daily What changed:  See the new instructions.   HYDROcodone-acetaminophen 5-325 MG tablet Commonly known as:  NORCO/VICODIN Take 1-2 tablets by mouth every 4 (four) hours as needed for moderate pain.   lidocaine 5 % Commonly known as:  LIDODERM Place 1 patch onto the skin daily. Remove & Discard patch within 12 hours or as directed by MD   methenamine 1 g tablet Commonly known as:  HIPREX Take 500 mg by mouth 2 (two) times daily with a meal.   multivitamin with minerals Tabs tablet Take 1 tablet by mouth daily.   senna-docusate 8.6-50 MG tablet Commonly known as:  Senokot-S Take 1 tablet by mouth 2 (two) times daily.   sertraline 50 MG tablet Commonly known as:  ZOLOFT Take 1 tablet (50 mg total) by mouth daily.   simvastatin 10 MG tablet Commonly known as:  ZOCOR Take 10 mg by mouth at bedtime.   SYSTANE ULTRA PF 0.4-0.3 % Soln Generic drug:  Polyethyl Glycol-Propyl Glycol Place 2 drops into both eyes 3 (three) times daily.   tiZANidine 2 MG tablet Commonly known as:  ZANAFLEX Take 1 tablet (2 mg total) by mouth every 8 (eight) hours as needed for muscle spasms.   VENTOLIN HFA 108 (90 Base) MCG/ACT inhaler Generic drug:  albuterol inhale 2 puffs by mouth every 6 hours AS NEEDED FOR WHEEZING   Vitamin D3 2000 units Tabs Take 1 tablet by mouth daily.   warfarin 2.5 MG tablet Commonly known as:  COUMADIN Take 2.5 mg by mouth every evening. Takes 2.5 mg everyday.      Contact information for after-discharge care    [facility] .   Service:  Skilled Nursing Contact information: [address] [address] [phone]             Allergies  Allergen Reactions  . Metoclopramide Hcl Other (See Comments)    REACTION: causes uncontrolable leg movements  . Nitrofurantoin Nausea And Vomiting and Other (See Comments)    REACTION: dizziness    Consultations:     Procedures/Studies: Ct Abdomen Pelvis  Wo Contrast  Result Date: 06/24/2017 CLINICAL DATA:  [place] at home.  Left-sided pain. EXAM: CT ABDOMEN AND PELVIS WITHOUT CONTRAST TECHNIQUE: Multidetector CT imaging of the abdomen and pelvis was performed following the standard protocol without IV contrast. COMPARISON:  01/07/2016 FINDINGS: Lower chest: Large hiatal hernia containing the stomach and mesentery. No sign of obstruction or torsion. Compressive atelectasis in the left lower lobe. Left lower lobe pneumonia not excluded. Hepatobiliary: Liver parenchyma is normal. Previous cholecystectomy. Pancreas: Normal Spleen: Normal Adrenals/Urinary Tract: Adrenal glands are normal. Some chronic renal atrophy. Chronic renal cysts, some of which are hyperdense. Mild chronic fullness of the left renal collecting system an ureter scratch mild chronic fullness of the renal collecting systems and ureters without evidence acute obstruction. The bladder is somewhat distended, possibly responsible for that finding. Stomach/Bowel: No acute bowel finding. Large hiatal hernia containing the stomach as noted above. No sign of bowel obstruction. No sign of  inflammatory disease in the abdomen. Diverticulosis without evidence of diverticulitis. Vascular/Lymphatic: Aortic atherosclerosis. No aneurysm. IVC is normal. No retroperitoneal adenopathy. Reproductive: No pelvic mass. Other: No free fluid or air. Musculoskeletal: Curvature in chronic degenerative changes throughout the lumbar region. No acute bone finding. IMPRESSION: No acute or traumatic finding. Large chronic hiatal hernia containing most of the stomach. Compressive atelectasis in the left lower lobe. Cannot rule out left base pneumonia. No acute abdominal or pelvic finding. Multiple renal cysts, some hyperdense. Diverticulosis without evidence of diverticulitis. Aortic atherosclerosis. Curvature and chronic degenerative changes of the spine. Electronically Signed   By: Nelson Chimes M.D.   On: 06/24/2017 20:54   Ct  Head Wo Contrast  Result Date: 06/24/2017 CLINICAL DATA:  [place] at home last night. EXAM: CT HEAD WITHOUT CONTRAST TECHNIQUE: Contiguous axial images were obtained from the base of the skull through the vertex without intravenous contrast. COMPARISON:  12/22/2006 FINDINGS: Brain: Age related atrophy. Chronic small-vessel ischemic changes of the white matter. No sign of acute infarction, mass lesion, hemorrhage, hydrocephalus or extra-axial collection. Vascular: There is atherosclerotic calcification of the major vessels at the base of the brain. Skull: Negative Sinuses/Orbits: Clear/normal Other: None IMPRESSION: No acute or traumatic finding. Age related atrophy and chronic small-vessel ischemic changes. Electronically Signed   By: Nelson Chimes M.D.   On: 06/24/2017 20:48   Ct L-spine No Charge  Result Date: 06/24/2017 CLINICAL DATA:  Thoracic spine pain after fall last night. EXAM: CT LUMBAR SPINE WITHOUT CONTRAST TECHNIQUE: Multidetector CT imaging of the lumbar spine was performed without intravenous contrast administration. Multiplanar CT image reconstructions were also generated. COMPARISON:  None. FINDINGS: SEGMENTATION: For the purposes of this report the last well-formed intervertebral disc space is reported as L5-S1. ALIGNMENT: Maintained lumbar lordosis. Minimal grade 1 T12-L1 through L3-4 retrolisthesis. No spondylolysis. Broad levoscoliosis on this nonweightbearing examination. VERTEBRAE: Vertebral bodies and posterior elements are intact. Severe T12-L1 through L3-4 and L5-S1 disc height loss with vacuum disc, endplate sclerosis and marginal spurring compatible with degenerative discs. Moderate to severe L4-5 degenerative disc. Multilevel severe facet arthropathy. Osteopenia without destructive bony lesions. Acute LEFT ninth and tenth nondisplaced rib fractures. PARASPINAL AND OTHER SOFT TISSUES: Please see CT of abdomen and pelvis from same day, reported separately. DISC LEVELS: L1-2:  Retrolisthesis. Endplate spurring. Mild canal stenosis. Severe RIGHT and moderate to severe LEFT neural foraminal narrowing. L2-3: Retrolisthesis. Endplate spurring. Severe RIGHT facet arthropathy. Mild canal stenosis. Moderate RIGHT neural foraminal narrowing. L3-4: Retrolisthesis. Endplate spurring with mild to moderate facet arthropathy. Mild canal stenosis. Severe RIGHT greater than LEFT neural foraminal narrowing. L4-5: Small broad-based disc osteophyte complex. Moderate to severe facet arthropathy. No canal stenosis. Severe RIGHT, moderate to severe LEFT neural foraminal narrowing. L5-S1: Small broad-based disc osteophyte complex. Severe facet arthropathy. No canal stenosis. Moderate RIGHT, severe LEFT neural foraminal narrowing. IMPRESSION: 1. No acute lumbar spine fracture. Multilevel retrolisthesis on degenerative basis. 2. Acute nondisplaced LEFT ninth and tenth rib fractures. 3. Mild canal stenosis L1-2 through L3-4. Multilevel severe neural foraminal narrowing. Electronically Signed   By: Elon Alas M.D.   On: 06/24/2017 20:53       Subjective: Still having pain with movement in ribs.  No shortness of breath.  Discharge Exam: Vitals:   06/28/17 0500 06/28/17 0911  BP: (!) 118/52   Pulse: 73   Resp: 18   Temp: 97.7 F (36.5 C)   SpO2: 92% 92%   Vitals:   06/27/17 1920 06/27/17 2100 06/28/17 0500 06/28/17 2458  BP:  (!) 132/49 (!) 118/52   Pulse:  82 73   Resp:  18 18   Temp:  98.4 F (36.9 C) 97.7 F (36.5 C)   TempSrc:  Oral Oral   SpO2: 92% 92% 92% 92%  Weight:      Height:        General: Pt is alert, awake, not in acute distress Cardiovascular: RRR, S1/S2 +, no rubs, no gallops Respiratory: CTA bilaterally, no wheezing, no rhonchi Abdominal: Soft, NT, ND, bowel sounds + Extremities: no edema, no cyanosis    The results of significant diagnostics from this hospitalization (including imaging, microbiology, ancillary and laboratory) are listed below for  reference.     Microbiology: No results found for this or any previous visit (from the past 240 hour(s)).   Labs: BNP (last 3 results) No results for input(s): BNP in the last 8760 hours. Basic Metabolic Panel: Recent Labs  Lab 06/24/17 1916 06/25/17 0450  NA 133* 136  K 3.8 4.5  CL 100* 101  CO2 22 24  GLUCOSE 118* 116*  BUN 33* 33*  CREATININE 1.07* 1.20*  CALCIUM 9.6 9.3   Liver Function Tests: No results for input(s): AST, ALT, ALKPHOS, BILITOT, PROT, ALBUMIN in the last 168 hours. No results for input(s): LIPASE, AMYLASE in the last 168 hours. No results for input(s): AMMONIA in the last 168 hours. CBC: Recent Labs  Lab 06/24/17 1916 06/25/17 0450 06/27/17 0505  WBC 5.9 5.4 6.1  NEUTROABS 4.1  --   --   HGB 12.6 11.9* 12.1  HCT 39.1 37.3 37.8  MCV 96.3 97.9 97.7  PLT 196 198 202   Cardiac Enzymes: No results for input(s): CKTOTAL, CKMB, CKMBINDEX, TROPONINI in the last 168 hours. BNP: Invalid input(s): POCBNP CBG: Recent Labs  Lab 06/27/17 0819 06/28/17 0753  GLUCAP 133* 140*   D-Dimer No results for input(s): DDIMER in the last 72 hours. Hgb A1c No results for input(s): HGBA1C in the last 72 hours. Lipid Profile No results for input(s): CHOL, HDL, LDLCALC, TRIG, CHOLHDL, LDLDIRECT in the last 72 hours. Thyroid function studies No results for input(s): TSH, T4TOTAL, T3FREE, THYROIDAB in the last 72 hours.  Invalid input(s): FREET3 Anemia work up No results for input(s): VITAMINB12, FOLATE, FERRITIN, TIBC, IRON, RETICCTPCT in the last 72 hours. Urinalysis    Component Value Date/Time   COLORURINE YELLOW 06/24/2017 1902   APPEARANCEUR CLEAR 06/24/2017 1902   LABSPEC 1.009 06/24/2017 1902   PHURINE 6.0 06/24/2017 1902   GLUCOSEU NEGATIVE 06/24/2017 1902   HGBUR NEGATIVE 06/24/2017 1902   BILIRUBINUR NEGATIVE 06/24/2017 1902   KETONESUR NEGATIVE 06/24/2017 1902   PROTEINUR NEGATIVE 06/24/2017 1902   NITRITE NEGATIVE 06/24/2017 1902    LEUKOCYTESUR NEGATIVE 06/24/2017 1902   Sepsis Labs Invalid input(s): PROCALCITONIN,  WBC,  LACTICIDVEN Microbiology No results found for this or any previous visit (from the past 240 hour(s)).   Time coordinating discharge: 35 minutes  SIGNED:   Kathie Dike, MD  [facility] 06/28/2017, 3:14 PM Pager   If 7PM-7AM, please contact night-coverage [email or web address] Password [email or web address]

## 2017-07-13 ENCOUNTER — Other Ambulatory Visit (HOSPITAL_COMMUNITY): Payer: Medicare Other

## 2017-07-13 ENCOUNTER — Ambulatory Visit (HOSPITAL_COMMUNITY): Payer: Medicare Other | Admitting: Adult Health

## 2017-07-19 ENCOUNTER — Ambulatory Visit (INDEPENDENT_AMBULATORY_CARE_PROVIDER_SITE_OTHER): Payer: Medicare Other | Admitting: Otolaryngology

## 2017-07-19 DIAGNOSIS — Z85858 Personal history of malignant neoplasm of other endocrine glands: Secondary | ICD-10-CM

## 2017-07-28 ENCOUNTER — Inpatient Hospital Stay (HOSPITAL_COMMUNITY): Payer: Medicare Other | Admitting: Internal Medicine

## 2017-07-28 ENCOUNTER — Other Ambulatory Visit: Payer: Self-pay

## 2017-07-28 ENCOUNTER — Inpatient Hospital Stay (HOSPITAL_COMMUNITY): Payer: Medicare Other | Attending: Hematology

## 2017-07-28 VITALS — BP 142/61 | HR 71 | Temp 98.1°F | Resp 20 | Wt 134.7 lb

## 2017-07-28 DIAGNOSIS — D649 Anemia, unspecified: Secondary | ICD-10-CM

## 2017-07-28 DIAGNOSIS — Z8572 Personal history of non-Hodgkin lymphomas: Secondary | ICD-10-CM

## 2017-07-28 DIAGNOSIS — I10 Essential (primary) hypertension: Secondary | ICD-10-CM | POA: Insufficient documentation

## 2017-07-28 DIAGNOSIS — C8589 Other specified types of non-Hodgkin lymphoma, extranodal and solid organ sites: Secondary | ICD-10-CM

## 2017-07-28 DIAGNOSIS — C8580 Other specified types of non-Hodgkin lymphoma, unspecified site: Secondary | ICD-10-CM

## 2017-07-28 LAB — CBC WITH DIFFERENTIAL/PLATELET
BASOS PCT: 0 %
Basophils Absolute: 0 10*3/uL (ref 0.0–0.1)
Eosinophils Absolute: 0.1 10*3/uL (ref 0.0–0.7)
Eosinophils Relative: 1 %
HEMATOCRIT: 37.6 % (ref 36.0–46.0)
HEMOGLOBIN: 12.3 g/dL (ref 12.0–15.0)
Lymphocytes Relative: 22 %
Lymphs Abs: 1.4 10*3/uL (ref 0.7–4.0)
MCH: 31.4 pg (ref 26.0–34.0)
MCHC: 32.7 g/dL (ref 30.0–36.0)
MCV: 95.9 fL (ref 78.0–100.0)
MONOS PCT: 9 %
Monocytes Absolute: 0.6 10*3/uL (ref 0.1–1.0)
NEUTROS ABS: 4.3 10*3/uL (ref 1.7–7.7)
NEUTROS PCT: 68 %
Platelets: 225 10*3/uL (ref 150–400)
RBC: 3.92 MIL/uL (ref 3.87–5.11)
RDW: 14.1 % (ref 11.5–15.5)
WBC: 6.3 10*3/uL (ref 4.0–10.5)

## 2017-07-28 LAB — COMPREHENSIVE METABOLIC PANEL
ALK PHOS: 88 U/L (ref 38–126)
ALT: 14 U/L (ref 14–54)
ANION GAP: 10 (ref 5–15)
AST: 19 U/L (ref 15–41)
Albumin: 3.8 g/dL (ref 3.5–5.0)
BILIRUBIN TOTAL: 0.6 mg/dL (ref 0.3–1.2)
BUN: 36 mg/dL — AB (ref 6–20)
CALCIUM: 9.6 mg/dL (ref 8.9–10.3)
CO2: 22 mmol/L (ref 22–32)
CREATININE: 1.17 mg/dL — AB (ref 0.44–1.00)
Chloride: 102 mmol/L (ref 101–111)
GFR calc Af Amer: 46 mL/min — ABNORMAL LOW (ref >60)
GFR calc non Af Amer: 40 mL/min — ABNORMAL LOW (ref >60)
GLUCOSE: 102 mg/dL — AB (ref 65–99)
Potassium: 4.5 mmol/L (ref 3.5–5.1)
Sodium: 134 mmol/L — ABNORMAL LOW (ref 135–145)
TOTAL PROTEIN: 6.7 g/dL (ref 6.5–8.1)

## 2017-07-28 LAB — LACTATE DEHYDROGENASE: LDH: 134 U/L (ref 98–192)

## 2017-07-28 NOTE — Patient Instructions (Signed)
[place] at [facility]  Discharge Instructions:  You were seen today by Dr. Walden Field.   _______________________________________________________________  Thank you for choosing [place] at [facility] to provide your oncology and hematology care.  To afford each patient quality time with our providers, please arrive at least 15 minutes before your scheduled appointment.  You need to re-schedule your appointment if you arrive 10 or more minutes late.  We strive to give you quality time with our providers, and arriving late affects you and other patients whose appointments are after yours.  Also, if you no show three or more times for appointments you may be dismissed from the clinic.  Again, thank you for choosing [facility] hope is that these requests will allow you access to exceptional care and in a timely manner. _______________________________________________________________  If you have questions after your visit, please contact our office at [phone] between the hours of 8:30 a.m. and 5:00 p.m. Voicemails left after 4:30 p.m. will not be returned until the following business day. _______________________________________________________________  For prescription refill requests, have your pharmacy contact our office. _______________________________________________________________  Recommendations made by the consultant and any test results will be sent to your referring physician. _______________________________________________________________

## 2017-07-28 NOTE — Progress Notes (Signed)
Diagnosis Marginal zone lymphoma of extranodal and solid organ sites [facility]) - Plan: CBC with Differential/Platelet, Comprehensive metabolic panel, Lactate dehydrogenase  Staging Cancer Staging No matching staging information was found for the patient.  Assessment and Plan: 1. 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  She was previously followed by Dr. Talbert Cage.  Last PET scan was done in September 2016 and showed evidence of resection of lymph node of left parotid gland.    Labs done today 07/28/2017 show white count 6.3 hemoglobin 12.3 platelets 225,000.  LDH is within normal limits at 134.  Chemistries are within normal limits.  She had an ultrasound of the neck done 01/15/2017 that showed previously palpable lymph node was not visualized on ultrasound.  She is advised to notify the office if she knows any abnormalities in the neck prior to her next visit.  She will return to clinic in 6 months for follow-up and repeat labs.  2.  Hypertension.  BP  is 142/61.  Follow-up with PCP.  3.  Anemia.  Hemoglobin 12.3 today.  She will have repeat labs on return to clinic.    Interval History:  84 y.o. female followed by Dr. 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 She is s/p Left lateral parotidectomy with facial nerve dissection 11/14/2014 with Dr. 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    Current Status:  Pt is seen today for follow-up.  She denies any lesions.  Denies fever, chills, night sweats and has noted no adenopathy.  Reports she was recently seen by Dr. Benjamine Mola.  She had a recent ultrasound.  Problem List Patient Active Problem List   Diagnosis Date Noted  . Left rib fracture [S22.32XA] 06/24/2017  . Rib fractures [S22.39XA] 06/24/2017  . History of DVT (deep vein  thrombosis) [Z86.718] 12/07/2016  . Acute pain of right shoulder [M25.511]   . Acute diverticulitis [K57.92] 01/07/2016  . Bright red rectal bleeding [K62.5] 01/07/2016  . 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 CKD (chronic kidney disease) [N18.9] 01/07/2016  . Diverticulitis [K57.92] 01/07/2016  . Sigmoid diverticulitis [K57.32] 01/07/2016  . Diverticulitis of large intestine without perforation or abscess with bleeding [K57.33]   . Marginal zone lymphoma of extranodal and solid organ sites [facility]) [C85.89] 09/09/2015  . Hx pulmonary embolism [Z86.711] 04/23/2015  . H/O superficial parotidectomy [Z98.890] 11/14/2014  . Deep vein thrombosis of lower extremity ([place]) [I82.409] 07/27/2011  . Embolism, pulmonary with infarction ([place]) [I26.99] 07/27/2011  . Diverticulitis of colon (without mention of hemorrhage)(562.11) [K57.32] 06/09/2011  . GERD (gastroesophageal reflux disease) [K21.9] 06/09/2011  . Right rib fracture [S22.31XA] 06/09/2011  . Anticoagulant long-term use [Z79.01] 06/09/2011  . COPD with chronic bronchitis ([place]) [J44.9] 06/09/2011  . HH (hiatus hernia) [K44.9] 11/04/2010  . Dysphagia [R13.10] 03/11/2010  . RENAL INSUFFICIENCY [N25.9] 04/02/2008  . IBS [K58.9] 01/24/2008  . HYPERCHOLESTEROLEMIA [E78.00] 03/14/2007  . ASTHMA [J45.909] 03/14/2007  . NEUROGENIC BLADDER [N31.9] 03/14/2007  . DISC DISEASE, CERVICAL [M50.30] 03/14/2007  . DEEP VENOUS THROMBOPHLEBITIS, HX OF [Z86.72] 03/14/2007  . ANEMIA [D64.9] 01/10/2007  . ANXIETY [F41.1] 01/10/2007  . Essential hypertension [I10] 01/10/2007  . Colon, diverticulosis [K57.30] 01/10/2007  . DEGENERATIVE JOINT DISEASE [M19.90] 01/10/2007  . OSTEOPENIA [M89.9, M94.9] 01/10/2007    Past Medical History Past Medical History:  Diagnosis Date  .  Anemia, unspecified   . Anxiety state, unspecified   . Blood dyscrasia    RV factor def  . Cancer ([place])    skin  . Degeneration of cervical intervertebral disc   .  Disorder of bone and cartilage, unspecified   . Diverticulosis of colon (without mention of hemorrhage) 01/13/1999  . Esophageal reflux   . Esophageal stricture   . Factor V deficiency ([place])   . Factor V deficiency ([place])   . Hiatal hernia 04/09/2010   ? Paraesophageal component on chest CT 2006  . HTN (hypertension)   . Irritable bowel syndrome   . Neurogenic bladder   . Osteoarthrosis, unspecified whether generalized or localized, unspecified site   . Peripheral vascular disease (HCC)    pe, dvt hx  . Personal history of thrombophlebitis   . Personal history of venous thrombosis and embolism   . Primary lymphoma of parotid gland ([place]) 2016  . Pure hypercholesterolemia   . Raynaud phenomenon   . Shortness of breath dyspnea   . Unspecified asthma(493.90)   . Unspecified disorder resulting from impaired renal function    cysts    Past Surgical History Past Surgical History:  Procedure Laterality Date  . CHOLECYSTECTOMY    . COLONOSCOPY  2000   Dr. Verl Blalock: moderate to severe sigmoid diverticulosis  . COLONOSCOPY  03/2015   [facility]. Peggye [facility]: TCS for abnormal sigmoid colon on PET. Moderate diverticulosis of the sigmoid colon, sessile 8-9 mm sigmoid: Polyp biopsied (given anticoagulation), two 3 mm ICV polyps removed. All polyps were tubular adenomas.  . ESOPHAGEAL MANOMETRY  10/26/2011   Procedure: ESOPHAGEAL MANOMETRY (EM);  Surgeon: Sable Feil, MD;  Location: [facility];  Service: Endoscopy;  Laterality: N/A;  . ESOPHAGOGASTRODUODENOSCOPY  03/2010   Dr. Verl Blalock: candida esophagitis, prolapsing hh with cameron lesions, multiple polyps  . EYE SURGERY Bilateral    cataracts  . Flexible sigmoidoscopy  05/2015   Dr. Peggye Form at [facility]: Performed off anticoagulation. 1-1.5 cm sessile sigmoid polyp removed, site tattooed. Pathology revealed tubular adenoma. Colonoscopy in 3-5 years as medically appropriate.  Marland Kitchen PAROTIDECTOMY Left 11/14/2014  .  PAROTIDECTOMY Left 11/14/2014   Procedure: LEFT LATERAL PAROTIDECTOMY;  Surgeon: Leta Baptist, MD;  Location: [facility];  Service: ENT;  Laterality: Left;  . 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 TONSILLECTOMY      Family History Family History  Problem Relation Age of Onset  . Parkinsonism Mother   . COPD Brother   . Asthma Brother   . Lung cancer Brother   . Heart attack Brother   . Diabetes Paternal Aunt      Social History  reports that she has never smoked. She has never used smokeless tobacco. She reports that she does not drink alcohol or use drugs.  Medications  Current Outpatient Medications:  .  ADVAIR DISKUS 100-50 MCG/DOSE AEPB, take 1 tablet twice a day (Patient taking differently: Inhale one puff by mouth daily in the morning), Disp: 60 each, Rfl: 11 .  allopurinol (ZYLOPRIM) 300 MG tablet, Take 300 mg by mouth daily.  , Disp: , Rfl:  .  Biotin 5000 MCG CAPS, Take 1 capsule by mouth daily., Disp: , Rfl:  .  Calcium Citrate-Vitamin D (CITRACAL MAXIMUM) 315-250 MG-UNIT TABS, Take 1 capsule by mouth daily., Disp: , Rfl:  .  Cholecalciferol (VITAMIN D3) 2000 UNITS TABS, Take 1 tablet by mouth daily.  , Disp: , Rfl:  .  DILT-XR 240  MG 24 hr capsule, Take 240 mg by mouth daily., Disp: , Rfl: 0 .  esomeprazole (NEXIUM) 40 MG capsule, Take 40 mg by mouth daily before breakfast. , Disp: , Rfl:  .  fluticasone (FLONASE) 50 MCG/ACT nasal spray, instill 2 sprays into each nostril once daily (Patient taking differently: instill 2 sprays into each nostril once daily as needed for allergies), Disp: 16 g, Rfl: 5 .  HYDROcodone-acetaminophen (NORCO/VICODIN) 5-325 MG tablet, Take 1-2 tablets by mouth every 4 (four) hours as needed for moderate pain., Disp: 10 tablet, Rfl: 0 .  lidocaine (LIDODERM) 5 %, Place 1 patch onto the skin daily. Remove & Discard patch within 12 hours or as directed by MD, Disp: 30 patch, Rfl: 0 .  methenamine (HIPREX) 1 G tablet, Take 500 mg by mouth 2 (two) times daily  with a meal. , Disp: , Rfl:  .  methylcellulose (CITRUCEL) oral powder, Take 1 tsp by mouth daily , Disp: , Rfl:  .  Multiple Vitamin (MULITIVITAMIN WITH MINERALS) TABS, Take 1 tablet by mouth daily., Disp: , Rfl:  .  Polyethyl Glycol-Propyl Glycol (SYSTANE ULTRA PF) 0.4-0.3 % SOLN, Place 2 drops into both eyes 3 (three) times daily. , Disp: , Rfl:  .  senna-docusate (SENOKOT-S) 8.6-50 MG tablet, Take 1 tablet by mouth 2 (two) times daily., Disp: , Rfl:  .  sertraline (ZOLOFT) 50 MG tablet, Take 1 tablet (50 mg total) by mouth daily., Disp: 90 tablet, Rfl: 1 .  simvastatin (ZOCOR) 10 MG tablet, Take 10 mg by mouth at bedtime.  , Disp: , Rfl:  .  tiZANidine (ZANAFLEX) 2 MG tablet, Take 1 tablet (2 mg total) by mouth every 8 (eight) hours as needed for muscle spasms., Disp: 30 tablet, Rfl: 0 .  VENTOLIN HFA 108 (90 BASE) MCG/ACT inhaler, inhale 2 puffs by mouth every 6 hours AS NEEDED FOR WHEEZING, Disp: 18 Inhaler, Rfl: 2 .  warfarin (COUMADIN) 2.5 MG tablet, Take 2.5 mg by mouth every evening. Takes 2.5 mg everyday., Disp: , Rfl:   Allergies Metoclopramide hcl and Nitrofurantoin  Review of Systems Review of Systems - Oncology ROS as per HPI otherwise 12 point ROS is negative.   Physical Exam  Vitals Wt Readings from Last 3 Encounters:  07/28/17 134 lb 11.2 oz (61.1 kg)  06/24/17 130 lb (59 kg)  01/15/17 130 lb (59 kg)   Temp Readings from Last 3 Encounters:  07/28/17 98.1 F (36.7 C) (Oral)  06/28/17 98.6 F (37 C) (Oral)  01/15/17 98.4 F (36.9 C)   BP Readings from Last 3 Encounters:  07/28/17 (!) 142/61  06/28/17 (!) 151/61  01/15/17 (!) 155/68   Pulse Readings from Last 3 Encounters:  07/28/17 71  06/28/17 82  01/15/17 67   Constitutional: Well-developed, well-nourished, and in no distress.   HENT: Head: Normocephalic and atraumatic.  Mouth/Throat: No oropharyngeal exudate. Mucosa moist. Eyes: Pupils are equal, round, and reactive to light. Conjunctivae are normal.  No scleral icterus.  Neck: Normal range of motion. Neck supple. No JVD present.   No palpable abnormalities in the neck. Cardiovascular: Normal rate, regular rhythm and normal heart sounds.  Exam reveals no gallop and no friction rub.   No murmur heard. Pulmonary/Chest: Effort normal and breath sounds normal. No respiratory distress. No wheezes.No rales.  Abdominal: Soft. Bowel sounds are normal. No distension. There is no tenderness. There is no guarding.  Musculoskeletal: No edema or tenderness.  Lymphadenopathy: No cervical, axillary or supraclavicular adenopathy.  Neurological: Alert  and oriented to person, place, and time. No cranial nerve deficit.  Skin: Skin is warm and dry. No rash noted. No erythema. No pallor.  Psychiatric: Affect and judgment normal.   Labs Appointment on 07/28/2017  Component Date Value Ref Range Status  . WBC 07/28/2017 6.3  4.0 - 10.5 K/uL Final  . RBC 07/28/2017 3.92  3.87 - 5.11 MIL/uL Final  . Hemoglobin 07/28/2017 12.3  12.0 - 15.0 g/dL Final  . HCT 07/28/2017 37.6  36.0 - 46.0 % Final  . MCV 07/28/2017 95.9  78.0 - 100.0 fL Final  . MCH 07/28/2017 31.4  26.0 - 34.0 pg Final  . MCHC 07/28/2017 32.7  30.0 - 36.0 g/dL Final  . RDW 07/28/2017 14.1  11.5 - 15.5 % Final  . Platelets 07/28/2017 225  150 - 400 K/uL Final  . Neutrophils Relative % 07/28/2017 68  % Final  . Neutro Abs 07/28/2017 4.3  1.7 - 7.7 K/uL Final  . Lymphocytes Relative 07/28/2017 22  % Final  . Lymphs Abs 07/28/2017 1.4  0.7 - 4.0 K/uL Final  . Monocytes Relative 07/28/2017 9  % Final  . Monocytes Absolute 07/28/2017 0.6  0.1 - 1.0 K/uL Final  . Eosinophils Relative 07/28/2017 1  % Final  . Eosinophils Absolute 07/28/2017 0.1  0.0 - 0.7 K/uL Final  . Basophils Relative 07/28/2017 0  % Final  . Basophils Absolute 07/28/2017 0.0  0.0 - 0.1 K/uL Final   Performed at [facility], [address], [place], [place] [address]  . Sodium 07/28/2017 134* 135 - 145 mmol/L Final  . Potassium  07/28/2017 4.5  3.5 - 5.1 mmol/L Final  . Chloride 07/28/2017 102  101 - 111 mmol/L Final  . CO2 07/28/2017 22  22 - 32 mmol/L Final  . Glucose, Bld 07/28/2017 102* 65 - 99 mg/dL Final  . BUN 07/28/2017 36* 6 - 20 mg/dL Final  . Creatinine, Ser 07/28/2017 1.17* 0.44 - 1.00 mg/dL Final  . Calcium 07/28/2017 9.6  8.9 - 10.3 mg/dL Final  . Total Protein 07/28/2017 6.7  6.5 - 8.1 g/dL Final  . Albumin 07/28/2017 3.8  3.5 - 5.0 g/dL Final  . AST 07/28/2017 19  15 - 41 U/L Final  . ALT 07/28/2017 14  14 - 54 U/L Final  . Alkaline Phosphatase 07/28/2017 88  38 - 126 U/L Final  . Total Bilirubin 07/28/2017 0.6  0.3 - 1.2 mg/dL Final  . GFR calc non Af Amer 07/28/2017 40* >60 mL/min Final  . GFR calc Af Amer 07/28/2017 46* >60 mL/min Final   Comment: (NOTE) The eGFR has been calculated using the CKD EPI equation. This calculation has not been validated in all clinical situations. eGFR's persistently <60 mL/min signify possible Chronic Kidney Disease.   Georgiann Hahn gap 07/28/2017 10  5 - 15 Final   Performed at [facility], [address], [place], [place] [address]  . LDH 07/28/2017 134  98 - 192 U/L Final   Performed at [facility], [address], [place],  [address]     Pathology Orders Placed This Encounter  Procedures  . CBC with Differential/Platelet    Standing Status:   Future    Standing Expiration Date:   07/29/2018  . Comprehensive metabolic panel    Standing Status:   Future    Standing Expiration Date:   07/29/2018  . Lactate dehydrogenase    Standing Status:   Future    Standing Expiration Date:   07/29/2018  Zoila Shutter MD

## 2017-09-23 ENCOUNTER — Encounter: Payer: Self-pay | Admitting: Orthopaedic Surgery

## 2017-09-23 ENCOUNTER — Ambulatory Visit: Payer: Medicare Other | Admitting: Orthopaedic Surgery

## 2017-09-23 VITALS — BP 145/74 | HR 76 | Temp 97.4°F | Ht 60.0 in | Wt 133.0 lb

## 2017-09-23 DIAGNOSIS — M25562 Pain in left knee: Secondary | ICD-10-CM | POA: Diagnosis not present

## 2017-09-23 DIAGNOSIS — G8929 Other chronic pain: Secondary | ICD-10-CM | POA: Diagnosis not present

## 2017-09-23 NOTE — Progress Notes (Signed)
Subjective:    Patient ID: Nancy Hicks, female    DOB: 1926-05-20, 84 y.o.   MRN: [id number]  HPI She has chronic pain of the left knee.  I saw her three years ago for this.  She subsequently went to [facility] and had Synvisc injections. Her pain became tolerable and her swelling continued.  She has done fairly well until the last several weeks.  She has more swelling, popping and pain.  She has no giving way of the left knee.  She has no trauma,no numbness, no redness.   Review of Systems  Constitutional: Positive for activity change.  Respiratory: Negative for cough and shortness of breath.   Cardiovascular: Negative for chest pain and leg swelling.  Musculoskeletal: Positive for arthralgias, gait problem and joint swelling.  Psychiatric/Behavioral: The patient is nervous/anxious.   All other systems reviewed and are negative.  Past Medical History:  Diagnosis Date  . Anemia, unspecified   . Anxiety state, unspecified   . Blood dyscrasia    RV factor def  . Cancer ([place])    skin  . Degeneration of cervical intervertebral disc   . Disorder of bone and cartilage, unspecified   . Diverticulosis of colon (without mention of hemorrhage) 01/13/1999  . Esophageal reflux   . Esophageal stricture   . Factor V deficiency ([place])   . Factor V deficiency ([place])   . Hiatal hernia 04/09/2010   ? Paraesophageal component on chest CT 2006  . HTN (hypertension)   . Irritable bowel syndrome   . Neurogenic bladder   . Osteoarthrosis, unspecified whether generalized or localized, unspecified site   . Peripheral vascular disease (HCC)    pe, dvt hx  . Personal history of thrombophlebitis   . Personal history of venous thrombosis and embolism   . Primary lymphoma of parotid gland ([place]) 2016  . Pure hypercholesterolemia   . Raynaud phenomenon   . Shortness of breath dyspnea   . Unspecified asthma(493.90)   . Unspecified disorder resulting from impaired renal function    cysts    Past Surgical History:  Procedure Laterality Date  . CHOLECYSTECTOMY    . COLONOSCOPY  2000   Dr. Verl Blalock: moderate to severe sigmoid diverticulosis  . COLONOSCOPY  03/2015   Dr. Peggye Form at [facility]: TCS for abnormal sigmoid colon on PET. Moderate diverticulosis of the sigmoid colon, sessile 8-9 mm sigmoid: Polyp biopsied (given anticoagulation), two 3 mm ICV polyps removed. All polyps were tubular adenomas.  . ESOPHAGEAL MANOMETRY  10/26/2011   Procedure: ESOPHAGEAL MANOMETRY (EM);  Surgeon: Sable Feil, MD;  Location: [facility];  Service: Endoscopy;  Laterality: N/A;  . ESOPHAGOGASTRODUODENOSCOPY  03/2010   Dr. Verl Blalock: candida esophagitis, prolapsing hh with cameron lesions, multiple polyps  . EYE SURGERY Bilateral    cataracts  . Flexible sigmoidoscopy  05/2015   Dr. Peggye Form at [facility]: Performed off anticoagulation. 1-1.5 cm sessile sigmoid polyp removed, site tattooed. Pathology revealed tubular adenoma. Colonoscopy in 3-5 years as medically appropriate.  Marland Kitchen PAROTIDECTOMY Left 11/14/2014  . PAROTIDECTOMY Left 11/14/2014   Procedure: LEFT LATERAL PAROTIDECTOMY;  Surgeon: Leta Baptist, MD;  Location: [facility];  Service: ENT;  Laterality: Left;  . 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 TONSILLECTOMY      Current Outpatient Medications on File Prior to Visit  Medication Sig Dispense Refill  . ADVAIR DISKUS 100-50 MCG/DOSE AEPB take 1 tablet twice a day (Patient taking differently: Inhale one puff by  mouth daily in the morning) 60 each 11  . allopurinol (ZYLOPRIM) 300 MG tablet Take 300 mg by mouth daily.      . Biotin 5000 MCG CAPS Take 1 capsule by mouth daily.    . Calcium Citrate-Vitamin D (CITRACAL MAXIMUM) 315-250 MG-UNIT TABS Take 1 capsule by mouth daily.    . chlorthalidone (HYGROTON) 25 MG tablet TK 1 T PO QD  4  . Cholecalciferol (VITAMIN D3) 2000 UNITS TABS Take 1 tablet by mouth daily.      Marland Kitchen DILT-XR 240 MG 24 hr capsule Take 240 mg by mouth  daily.  0  . esomeprazole (NEXIUM) 40 MG capsule Take 40 mg by mouth daily before breakfast.     . losartan (COZAAR) 100 MG tablet TK 1 T PO QD  0  . methenamine (HIPREX) 1 G tablet Take 500 mg by mouth 2 (two) times daily with a meal.     . methylcellulose (CITRUCEL) oral powder Take 1 tsp by mouth daily     . Multiple Vitamin (MULITIVITAMIN WITH MINERALS) TABS Take 1 tablet by mouth daily.    Vladimir Faster Glycol-Propyl Glycol (SYSTANE ULTRA PF) 0.4-0.3 % SOLN Place 2 drops into both eyes 3 (three) times daily.     Marland Kitchen senna-docusate (SENOKOT-S) 8.6-50 MG tablet Take 1 tablet by mouth 2 (two) times daily.    . simvastatin (ZOCOR) 10 MG tablet Take 10 mg by mouth at bedtime.      Marland Kitchen spironolactone (ALDACTONE) 25 MG tablet Take 12.5 mg by mouth daily.  12  . warfarin (COUMADIN) 2.5 MG tablet Take 2.5 mg by mouth every evening. Takes 2.5 mg everyday.    . fluticasone (FLONASE) 50 MCG/ACT nasal spray instill 2 sprays into each nostril once daily (Patient not taking: Reported on 09/23/2017) 16 g 5  . HYDROcodone-acetaminophen (NORCO/VICODIN) 5-325 MG tablet Take 1-2 tablets by mouth every 4 (four) hours as needed for moderate pain. (Patient not taking: Reported on 09/23/2017) 10 tablet 0  . lidocaine (LIDODERM) 5 % Place 1 patch onto the skin daily. Remove & Discard patch within 12 hours or as directed by MD (Patient not taking: Reported on 09/23/2017) 30 patch 0  . tiZANidine (ZANAFLEX) 2 MG tablet Take 1 tablet (2 mg total) by mouth every 8 (eight) hours as needed for muscle spasms. (Patient not taking: Reported on 09/23/2017) 30 tablet 0  . VENTOLIN HFA 108 (90 BASE) MCG/ACT inhaler inhale 2 puffs by mouth every 6 hours AS NEEDED FOR WHEEZING (Patient not taking: Reported on 09/23/2017) 18 Inhaler 2   No current facility-administered medications on file prior to visit.     Social History   Socioeconomic History  . Marital status: Widowed    Spouse name: Not on file  . Number of children: 2  . Years  of education: Not on file  . Highest education level: Not on file  Occupational History  . Occupation: retired    Fish farm manager: RETIRED  Social Needs  . Financial resource strain: Not on file  . Food insecurity:    Worry: Not on file    Inability: Not on file  . Transportation needs:    Medical: Not on file    Non-medical: Not on file  Tobacco Use  . Smoking status: Never Smoker  . Smokeless tobacco: Never Used  Substance and Sexual Activity  . Alcohol use: No  . Drug use: No  . Sexual activity: Not Currently    Birth control/protection: Post-menopausal  Lifestyle  .  Physical activity:    Days per week: Not on file    Minutes per session: Not on file  . Stress: Not on file  Relationships  . Social connections:    Talks on phone: Not on file    Gets together: Not on file    Attends religious service: Not on file    Active member of club or organization: Not on file    Attends meetings of clubs or organizations: Not on file    Relationship status: Not on file  . Intimate partner violence:    Fear of current or ex partner: Not on file    Emotionally abused: Not on file    Physically abused: Not on file    Forced sexual activity: Not on file  Other Topics Concern  . Not on file  Social History Narrative  . Not on file    Family History  Problem Relation Age of Onset  . Parkinsonism Mother   . COPD Brother   . Asthma Brother   . Lung cancer Brother   . Heart attack Brother   . Diabetes Paternal Aunt     BP (!) 145/74   Pulse 76   Temp (!) 97.4 F (36.3 C)   Ht 5' (1.524 m)   Wt 133 lb (60.3 kg)   BMI 25.97 kg/m   Body mass index is 25.97 kg/m.      Objective:   Physical Exam  Constitutional: She is oriented to person, place, and time. She appears well-developed and well-nourished.  HENT:  Head: Normocephalic and atraumatic.  Eyes: Pupils are equal, round, and reactive to light. Conjunctivae and EOM are normal.  Neck: Normal range of motion. Neck  supple.  Cardiovascular: Normal rate, regular rhythm and intact distal pulses.  Pulmonary/Chest: Effort normal.  Abdominal: Soft.  Musculoskeletal:       Left knee: Tenderness found. Medial joint line tenderness noted.       Legs: Neurological: She is alert and oriented to person, place, and time. She has normal reflexes. She displays normal reflexes. No cranial nerve deficit. She exhibits normal muscle tone. Coordination normal.  Skin: Skin is warm and dry.  Psychiatric: She has a normal mood and affect. Her behavior is normal. Judgment and thought content normal.          Assessment & Plan:   Encounter Diagnosis  Name Primary?  . Chronic pain of left knee Yes   PROCEDURE NOTE:  The patient requests injections of the left knee , verbal consent was obtained.  The left knee was prepped appropriately after time out was performed.   Sterile technique was observed and injection of 1 cc of Depo-Medrol 40 mg with several cc's of plain xylocaine. Anesthesia was provided by ethyl chloride and a 20-gauge needle was used to inject the knee area. The injection was tolerated well.  A band aid dressing was applied.  The patient was advised to apply ice later today and tomorrow to the injection sight as needed.  Return in two weeks.  Call if any problem.  Precautions discussed.   Electronically Signed Sanjuana Kava, MD 6/27/201910:24 AM

## 2017-10-06 ENCOUNTER — Ambulatory Visit: Payer: Medicare Other | Admitting: Orthopaedic Surgery

## 2017-10-06 ENCOUNTER — Encounter: Payer: Self-pay | Admitting: Orthopaedic Surgery

## 2017-10-06 VITALS — BP 135/70 | HR 79 | Ht 60.0 in | Wt 134.0 lb

## 2017-10-06 DIAGNOSIS — M25562 Pain in left knee: Secondary | ICD-10-CM | POA: Diagnosis not present

## 2017-10-06 DIAGNOSIS — G8929 Other chronic pain: Secondary | ICD-10-CM | POA: Diagnosis not present

## 2017-10-06 NOTE — Progress Notes (Signed)
Patient Nancy Hicks, female DOB:December 30, 1926, 84 y.o. [id number]  Chief Complaint  Patient presents with  . Knee Pain    left     HPI  Nancy Hicks is a 84 y.o. female who has left knee pain.  She is better and has less pain of the left knee since the injection last time.  She still has some pain and swelling but it is less.  She is using her cane.  She fell last week and broke some ribs but her knee was not injured.   Body mass index is 26.17 kg/m.  ROS  Review of Systems  Constitutional: Positive for activity change.  Respiratory: Negative for cough and shortness of breath.   Cardiovascular: Negative for chest pain and leg swelling.  Musculoskeletal: Positive for arthralgias, gait problem and joint swelling.  Psychiatric/Behavioral: The patient is nervous/anxious.   All other systems reviewed and are negative.   All other systems reviewed and are negative.  Past Medical History:  Diagnosis Date  . Anemia, unspecified   . Anxiety state, unspecified   . Blood dyscrasia    RV factor def  . Cancer ([place])    skin  . Degeneration of cervical intervertebral disc   . Disorder of bone and cartilage, unspecified   . Diverticulosis of colon (without mention of hemorrhage) 01/13/1999  . Esophageal reflux   . Esophageal stricture   . Factor V deficiency ([place])   . Factor V deficiency ([place])   . Hiatal hernia 04/09/2010   ? Paraesophageal component on chest CT 2006  . HTN (hypertension)   . Irritable bowel syndrome   . Neurogenic bladder   . Osteoarthrosis, unspecified whether generalized or localized, unspecified site   . Peripheral vascular disease (HCC)    pe, dvt hx  . Personal history of thrombophlebitis   . Personal history of venous thrombosis and embolism   . 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 Pure hypercholesterolemia   . Raynaud phenomenon   . Shortness of breath dyspnea   . Unspecified asthma(493.90)   . Unspecified disorder  resulting from impaired renal function    cysts    Past Surgical History:  Procedure Laterality Date  . CHOLECYSTECTOMY    . COLONOSCOPY  2000   Dr. Verl Blalock: moderate to severe sigmoid diverticulosis  . COLONOSCOPY  03/2015   Dr. Peggye Form at [facility]: TCS for abnormal sigmoid colon on PET. Moderate diverticulosis of the sigmoid colon, sessile 8-9 mm sigmoid: Polyp biopsied (given anticoagulation), two 3 mm ICV polyps removed. All polyps were tubular adenomas.  . ESOPHAGEAL MANOMETRY  10/26/2011   Procedure: ESOPHAGEAL MANOMETRY (EM);  Surgeon: Sable Feil, MD;  Location: [facility];  Service: Endoscopy;  Laterality: N/A;  . ESOPHAGOGASTRODUODENOSCOPY  03/2010   Dr. Verl Blalock: candida esophagitis, prolapsing hh with cameron lesions, multiple polyps  . EYE SURGERY Bilateral    cataracts  . Flexible sigmoidoscopy  05/2015   Dr. Peggye Form at [facility]: Performed off anticoagulation. 1-1.5 cm sessile sigmoid polyp removed, site tattooed. Pathology revealed tubular adenoma. Colonoscopy in 3-5 years as medically appropriate.  Marland Kitchen PAROTIDECTOMY Left 11/14/2014  . PAROTIDECTOMY Left 11/14/2014   Procedure: LEFT LATERAL PAROTIDECTOMY;  Surgeon: Leta Baptist, MD;  Location: [facility];  Service: ENT;  Laterality: Left;  . 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 TONSILLECTOMY      Family History  Problem Relation Age of Onset  . Parkinsonism Mother   . COPD Brother   .  Asthma Brother   . Lung cancer Brother   . Heart attack Brother   . Diabetes Paternal Aunt     Social History Social History   Tobacco Use  . Smoking status: Never Smoker  . Smokeless tobacco: Never Used  Substance Use Topics  . Alcohol use: No  . Drug use: No    Allergies  Allergen Reactions  . Metoclopramide Hcl Other (See Comments)    REACTION: causes uncontrolable leg movements  . Nitrofurantoin Nausea And Vomiting and Other (See Comments)    REACTION: dizziness    Current Outpatient Medications   Medication Sig Dispense Refill  . ADVAIR DISKUS 100-50 MCG/DOSE AEPB take 1 tablet twice a day (Patient taking differently: Inhale one puff by mouth daily in the morning) 60 each 11  . allopurinol (ZYLOPRIM) 300 MG tablet Take 300 mg by mouth daily.      . Biotin 5000 MCG CAPS Take 1 capsule by mouth daily.    . Calcium Citrate-Vitamin D (CITRACAL MAXIMUM) 315-250 MG-UNIT TABS Take 1 capsule by mouth daily.    . chlorthalidone (HYGROTON) 25 MG tablet TK 1 T PO QD  4  . Cholecalciferol (VITAMIN D3) 2000 UNITS TABS Take 1 tablet by mouth daily.      Marland Kitchen DILT-XR 240 MG 24 hr capsule Take 240 mg by mouth daily.  0  . esomeprazole (NEXIUM) 40 MG capsule Take 40 mg by mouth daily before breakfast.     . losartan (COZAAR) 100 MG tablet TK 1 T PO QD  0  . methenamine (HIPREX) 1 G tablet Take 500 mg by mouth 2 (two) times daily with a meal.     . methylcellulose (CITRUCEL) oral powder Take 1 tsp by mouth daily     . Multiple Vitamin (MULITIVITAMIN WITH MINERALS) TABS Take 1 tablet by mouth daily.    Vladimir Faster Glycol-Propyl Glycol (SYSTANE ULTRA PF) 0.4-0.3 % SOLN Place 2 drops into both eyes 3 (three) times daily.     Marland Kitchen senna-docusate (SENOKOT-S) 8.6-50 MG tablet Take 1 tablet by mouth 2 (two) times daily.    . simvastatin (ZOCOR) 10 MG tablet Take 10 mg by mouth at bedtime.      Marland Kitchen spironolactone (ALDACTONE) 25 MG tablet Take 12.5 mg by mouth daily.  12  . warfarin (COUMADIN) 2.5 MG tablet Take 2.5 mg by mouth every evening. Takes 2.5 mg everyday.    . fluticasone (FLONASE) 50 MCG/ACT nasal spray instill 2 sprays into each nostril once daily (Patient not taking: Reported on 09/23/2017) 16 g 5  . lidocaine (LIDODERM) 5 % Place 1 patch onto the skin daily. Remove & Discard patch within 12 hours or as directed by MD (Patient not taking: Reported on 09/23/2017) 30 patch 0  . tiZANidine (ZANAFLEX) 2 MG tablet Take 1 tablet (2 mg total) by mouth every 8 (eight) hours as needed for muscle spasms. (Patient not  taking: Reported on 09/23/2017) 30 tablet 0  . VENTOLIN HFA 108 (90 BASE) MCG/ACT inhaler inhale 2 puffs by mouth every 6 hours AS NEEDED FOR WHEEZING (Patient not taking: Reported on 09/23/2017) 18 Inhaler 2   No current facility-administered medications for this visit.      Physical Exam  Blood pressure 135/70, pulse 79, height 5' (1.524 m), weight 134 lb (60.8 kg).  Constitutional: overall normal hygiene, normal nutrition, well developed, normal grooming, normal body habitus. Assistive device:cane  Musculoskeletal: gait and station Limp left, muscle tone and strength are normal, no tremors or atrophy is  present.  .  Neurological: coordination overall normal.  Deep tendon reflex/nerve stretch intact.  Sensation normal.  Cranial nerves II-XII intact.   Skin:   Normal overall no scars, lesions, ulcers or rashes. No psoriasis.  Psychiatric: Alert and oriented x 3.  Recent memory intact, remote memory unclear.  Normal mood and affect. Well groomed.  Good eye contact.  Cardiovascular: overall no swelling, no varicosities, no edema bilaterally, normal temperatures of the legs and arms, no clubbing, cyanosis and good capillary refill.  Lymphatic: palpation is normal.  Left knee is tender, 1+ effusion, ROM 0 to 105, crepitus, stable, limp to the left.   All other systems reviewed and are negative   The patient has been educated about the nature of the problem(s) and counseled on treatment options.  The patient appeared to understand what I have discussed and is in agreement with it.  Encounter Diagnosis  Name Primary?  . Chronic pain of left knee Yes    PLAN Call if any problems.  Precautions discussed.  Continue current medications.   Return to clinic 3 weeks   Electronically Signed Sanjuana Kava, MD 7/10/20191:45 PM

## 2017-10-27 ENCOUNTER — Ambulatory Visit: Payer: Medicare Other | Admitting: Orthopaedic Surgery

## 2017-11-02 ENCOUNTER — Ambulatory Visit: Payer: Medicare Other | Admitting: Orthopaedic Surgery

## 2017-11-09 ENCOUNTER — Encounter: Payer: Self-pay | Admitting: Orthopaedic Surgery

## 2017-11-09 ENCOUNTER — Ambulatory Visit: Payer: Medicare Other | Admitting: Orthopaedic Surgery

## 2017-11-09 VITALS — BP 148/72 | HR 76 | Temp 98.1°F | Ht 60.0 in | Wt 130.0 lb

## 2017-11-09 DIAGNOSIS — G8929 Other chronic pain: Secondary | ICD-10-CM

## 2017-11-09 DIAGNOSIS — M25562 Pain in left knee: Secondary | ICD-10-CM | POA: Diagnosis not present

## 2017-11-09 NOTE — Progress Notes (Signed)
Patient Nancy Hicks, female DOB:[birth year], 84 y.o. [id number]  Chief Complaint  Patient presents with  . Knee Pain    Recheck on left knee pain.    HPI  Nancy Hicks is a 84 y.o. female who has left knee pain.  She is much improved today and has little pain and little swelling.  She has no new trauma, no giving way.   Body mass index is 25.39 kg/m.  ROS  Review of Systems  All other systems reviewed and are negative.  Past Medical History:  Diagnosis Date  . Anemia, unspecified   . Anxiety state, unspecified   . Blood dyscrasia    RV factor def  . Cancer ([place])    skin  . Degeneration of cervical intervertebral disc   . Disorder of bone and cartilage, unspecified   . Diverticulosis of colon (without mention of hemorrhage) 01/13/1999  . Esophageal reflux   . Esophageal stricture   . Factor V deficiency ([place])   . Factor V deficiency ([place])   . Hiatal hernia 04/09/2010   ? Paraesophageal component on chest CT 2006  . HTN (hypertension)   . Irritable bowel syndrome   . Neurogenic bladder   . Osteoarthrosis, unspecified whether generalized or localized, unspecified site   . Peripheral vascular disease (HCC)    pe, dvt hx  . Personal history of thrombophlebitis   . Personal history of venous thrombosis and embolism   . 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 Pure hypercholesterolemia   . Raynaud phenomenon   . Shortness of breath dyspnea   . Unspecified asthma(493.90)   . Unspecified disorder resulting from impaired renal function    cysts    Past Surgical History:  Procedure Laterality Date  . CHOLECYSTECTOMY    . COLONOSCOPY  2000   Dr. Verl Blalock: moderate to severe sigmoid diverticulosis  . COLONOSCOPY  03/2015   [facility] Peggye Form [facility]: TCS for abnormal sigmoid colon on PET. Moderate diverticulosis of the sigmoid colon, sessile 8-9 mm sigmoid: Polyp biopsied (given anticoagulation), two 3 mm ICV polyps removed. All polyps  were tubular adenomas.  . ESOPHAGEAL MANOMETRY  10/26/2011   Procedure: ESOPHAGEAL MANOMETRY (EM);  Surgeon: Sable Feil, MD;  Location: [facility];  Service: Endoscopy;  Laterality: N/A;  . ESOPHAGOGASTRODUODENOSCOPY  03/2010   Dr. Verl Blalock: candida esophagitis, prolapsing hh with cameron lesions, multiple polyps  . EYE SURGERY Bilateral    cataracts  . Flexible sigmoidoscopy  05/2015   Dr. Peggye [facility] at [facility]: Performed off anticoagulation. 1-1.5 cm sessile sigmoid polyp removed, site tattooed. Pathology revealed tubular adenoma. Colonoscopy in 3-5 years as medically appropriate.  Marland Kitchen PAROTIDECTOMY Left 11/14/2014  . PAROTIDECTOMY Left 11/14/2014   Procedure: LEFT LATERAL PAROTIDECTOMY;  Surgeon: Leta Baptist, MD;  Location: [facility];  Service: ENT;  Laterality: Left;  . 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 TONSILLECTOMY      Family History  Problem Relation Age of Onset  . Parkinsonism Mother   . COPD Brother   . Asthma Brother   . Lung cancer Brother   . Heart attack Brother   . Diabetes Paternal Aunt     Social History Social History   Tobacco Use  . Smoking status: Never Smoker  . Smokeless tobacco: Never Used  Substance Use Topics  . Alcohol use: No  . Drug use: No    Allergies  Allergen Reactions  . Metoclopramide Hcl Other (See Comments)  REACTION: causes uncontrolable leg movements  . Nitrofurantoin Nausea And Vomiting and Other (See Comments)    REACTION: dizziness    Current Outpatient Medications  Medication Sig Dispense Refill  . ADVAIR DISKUS 100-50 MCG/DOSE AEPB take 1 tablet twice a day (Patient taking differently: Inhale one puff by mouth daily in the morning) 60 each 11  . allopurinol (ZYLOPRIM) 300 MG tablet Take 300 mg by mouth daily.      . Biotin 5000 MCG CAPS Take 1 capsule by mouth daily.    . Calcium Citrate-Vitamin D (CITRACAL MAXIMUM) 315-250 MG-UNIT TABS Take 1 capsule by mouth daily.    . chlorthalidone (HYGROTON) 25 MG tablet TK  1 T PO QD  4  . Cholecalciferol (VITAMIN D3) 2000 UNITS TABS Take 1 tablet by mouth daily.      Marland Kitchen DILT-XR 240 MG 24 hr capsule Take 240 mg by mouth daily.  0  . esomeprazole (NEXIUM) 40 MG capsule Take 40 mg by mouth daily before breakfast.     . fluticasone (FLONASE) 50 MCG/ACT nasal spray instill 2 sprays into each nostril once daily (Patient not taking: Reported on 09/23/2017) 16 g 5  . lidocaine (LIDODERM) 5 % Place 1 patch onto the skin daily. Remove & Discard patch within 12 hours or as directed by MD (Patient not taking: Reported on 09/23/2017) 30 patch 0  . losartan (COZAAR) 100 MG tablet TK 1 T PO QD  0  . methenamine (HIPREX) 1 G tablet Take 500 mg by mouth 2 (two) times daily with a meal.     . methylcellulose (CITRUCEL) oral powder Take 1 tsp by mouth daily     . Multiple Vitamin (MULITIVITAMIN WITH MINERALS) TABS Take 1 tablet by mouth daily.    Vladimir Faster Glycol-Propyl Glycol (SYSTANE ULTRA PF) 0.4-0.3 % SOLN Place 2 drops into both eyes 3 (three) times daily.     Marland Kitchen senna-docusate (SENOKOT-S) 8.6-50 MG tablet Take 1 tablet by mouth 2 (two) times daily.    . simvastatin (ZOCOR) 10 MG tablet Take 10 mg by mouth at bedtime.      Marland Kitchen spironolactone (ALDACTONE) 25 MG tablet Take 12.5 mg by mouth daily.  12  . tiZANidine (ZANAFLEX) 2 MG tablet Take 1 tablet (2 mg total) by mouth every 8 (eight) hours as needed for muscle spasms. (Patient not taking: Reported on 09/23/2017) 30 tablet 0  . VENTOLIN HFA 108 (90 BASE) MCG/ACT inhaler inhale 2 puffs by mouth every 6 hours AS NEEDED FOR WHEEZING (Patient not taking: Reported on 09/23/2017) 18 Inhaler 2  . warfarin (COUMADIN) 2.5 MG tablet Take 2.5 mg by mouth every evening. Takes 2.5 mg everyday.     No current facility-administered medications for this visit.      Physical Exam  Blood pressure (!) 148/72, pulse 76, temperature 98.1 F (36.7 C), height 5' (1.524 m), weight 130 lb (59 kg).  Constitutional: overall normal hygiene, normal  nutrition, well developed, normal grooming, normal body habitus. Assistive device:cane  Musculoskeletal: gait and station Limp left, muscle tone and strength are normal, no tremors or atrophy is present.  .  Neurological: coordination overall normal.  Deep tendon reflex/nerve stretch intact.  Sensation normal.  Cranial nerves II-XII intact.   Skin:   Normal overall no scars, lesions, ulcers or rashes. No psoriasis.  Psychiatric: Alert and oriented x 3.  Recent memory intact, remote memory unclear.  Normal mood and affect. Well groomed.  Good eye contact.  Cardiovascular: overall no swelling, no varicosities,  no edema bilaterally, normal temperatures of the legs and arms, no clubbing, cyanosis and good capillary refill.  Lymphatic: palpation is normal.  Left knee has ROM of 0 to 110, crepitus, no effusion today, slight limp left, slight medial joint line pain, NV intact.  She uses a cane.  All other systems reviewed and are negative   The patient has been educated about the nature of the problem(s) and counseled on treatment options.  The patient appeared to understand what I have discussed and is in agreement with it.  Encounter Diagnosis  Name Primary?  . Chronic pain of left knee Yes    PLAN Call if any problems.  Precautions discussed.  Continue current medications.   Return to clinic as needed.   Electronically Signed Sanjuana Kava, MD 8/13/20191:47 PM

## 2017-12-07 ENCOUNTER — Ambulatory Visit: Payer: Medicare Other | Admitting: Pulmonary Disease

## 2017-12-07 ENCOUNTER — Encounter: Payer: Self-pay | Admitting: Pulmonary Disease

## 2017-12-07 ENCOUNTER — Ambulatory Visit (INDEPENDENT_AMBULATORY_CARE_PROVIDER_SITE_OTHER)
Admission: RE | Admit: 2017-12-07 | Discharge: 2017-12-07 | Disposition: A | Discharge status: Home / Self Care | Payer: Medicare Other | Source: Ambulatory Visit | Attending: Pulmonary Disease | Admitting: Pulmonary Disease

## 2017-12-07 VITALS — BP 132/64 | HR 74 | Temp 98.0°F | Ht 60.0 in | Wt 134.0 lb

## 2017-12-07 DIAGNOSIS — Z86711 Personal history of pulmonary embolism: Secondary | ICD-10-CM

## 2017-12-07 DIAGNOSIS — K219 Gastro-esophageal reflux disease without esophagitis: Secondary | ICD-10-CM | POA: Diagnosis not present

## 2017-12-07 DIAGNOSIS — J449 Chronic obstructive pulmonary disease, unspecified: Secondary | ICD-10-CM | POA: Diagnosis not present

## 2017-12-07 DIAGNOSIS — D682 Hereditary deficiency of other clotting factors: Secondary | ICD-10-CM

## 2017-12-07 DIAGNOSIS — K449 Diaphragmatic hernia without obstruction or gangrene: Secondary | ICD-10-CM

## 2017-12-07 DIAGNOSIS — Z8672 Personal history of thrombophlebitis: Secondary | ICD-10-CM

## 2017-12-07 DIAGNOSIS — Z9889 Other specified postprocedural states: Secondary | ICD-10-CM

## 2017-12-07 DIAGNOSIS — C8589 Other specified types of non-Hodgkin lymphoma, extranodal and solid organ sites: Secondary | ICD-10-CM

## 2017-12-07 NOTE — Patient Instructions (Signed)
Today we updated your med list in our EPIC system...    Continue your current medications the same...  Today we did a follow up CXR...    We will contact you w/ the results when available...   Stay as active as possible, and be careful-- no falling allowed!!!  Call for any questions or if I can be of service in any way...  Lotta, it has been my great honor to have been one of your doctors over these many years!!    Wishing you good health & much happiness in the years to come.Marland KitchenMarland Kitchen

## 2017-12-07 NOTE — Progress Notes (Addendum)
Subjective:    Patient ID: Nancy Hicks, female    DOB: 05-22-1926, 84 y.o.   MRN: [id number]  HPI 84 y/o WF here for a follow up visit... she has multiple medical problems as noted below & is followed both here and in [place] by DrFagin...  ~  SEE PREV EPIC NOTES FOR OLDER DATA >>    LABS 2/14:  FLP- at goals on simva10+Feno160;  Chems- ok w/ Creat=1.4;  CBC- ok w/ Hg=11.5;  TSH= 1.56...   ~  May 29, 2013:  69mo ROV & Nancy Hicks is c/o some sinusitis, left ear discomfort, low grade temp, and feeling rundown;  She is blowing out some yellow mucus, no blood, & has left sided sinus HA;  We discussed Rx w/ ZPak, Flonase, & Medrol Dosepak for relief, along w/ Mucinex, Saline, & fluids...  We reviewed the following medical problems during today's office visit >>     Asthma> on Advair100Bid & Proair prn; breathing is good w/o recent exac; also uses OTC Antihist & Flonase as needed for nasal symptoms...    HxPE/ DVT/ FactorV deficiency> on Coumadin via [place] [facility] & doing satis by her report; she denies leg pain, swelling, etc...     HBP> on Cardizem240, Cozaar100, Lasix40-1/2, & K20; BP= 120/70 & occas higher at home; denies CP, palpit, ch in SOB or edema...    CHOL> on Simva10 + Feno160; FLP 3/15 showed TChol 153, TG 114, HDL 49, LDL 81    Hx severe GERD> hx largeHH & severe GERD, followed by DrPatterson & eval at [facility] as well- symptoms improved on Nexium40Bid    Divertics> on Citrucel daily; she had acute diverticulitis 2/13 treated by DrFagin in [place]; doing better since then & denies abd pain, n/v, c/d, blood seen...    Renal Insuffic> hx mild RI w/ BUN=35, Cr= 1.6    GU- Neurogenic bladder> followed by Urology; on Methenamine500Bid to prevent bladder infections & improved; she used to self cath but has been voiding satis recently...    DJD, hyperuricemia, Osteopenia> Hx Cspine dis w/ myelopathy, followed by [facility] & improved; on Allopurinol300 w/ Uric= 3-4 range on  this...    Anxiety> on Zoloft100-1/2 daily...    Anemia> Hg stable in the 11 to 12 range... We reviewed prob list, meds, xrays and labs> see below for updates >>   CXR 3/15 showed norm heart size, largeHH, scoliosis, clear lungs & NAD...  LABS 3/15:  FLP at goals on Simva10 & Feno160;  Chems- ok x mild RI w/ BUN=35, Cr=1.6;  CBC- ok x Hg=11.9;  TSH=1.39  ~  November 29, 2013:  93mo ROV & Nancy Hicks reports a good interval after being treated for AB 3/15; she has maintained on her ADVAIR100Bid & has AlbutHFA for prn use;  She denies much cough, sputum, dyspnea, etc;  She is [place] 7 [place], no facilities nearby etc;  Asked to consider silver sneakers vs home exercise program, etc;  As noted she has a large HH & symptoms cntrolled w/ Nexium40Bid, antireflux regimen, elev HOB, NPO after dinner, etc;  She continues to f/u w/ DrFagin in [place] on Coumadin w/ Hx DVT & PE- stable;  Hx HBP, Hyperlipidemia, renal insuffic & neurogenic bladder, other issues as noted> all stable on meds w/ labs per DrFagin...   We reviewed prob list, meds, xrays and labs> see below for updates >>   ~  December 04, 2014:  49yr Nancy Hicks reports a good year from the pulmonary standpoint- stable on Advair100Bid & hasn't needed the Clark rescue inhaler in >75yr; she notes breathing is good, no cough/ sputum/ hemoptysis/ SOB or chest discomfort; unfortunately she has had a lot of orthopedic problems left knee & left hip especially, she been told she needs a TKR but Ortho reluctant to do it w/ her hx DVT/ PTE, we discussed getting a second opinion & she would like to see DrAlusio... She also had a knot develop on her left parotid glad=> eval & surg by [facility] 8/16 w/ atypical lymphoid infiltrate & they are awaiting special studies to r/o any cancer cells... We reviewed the following medical problems during today's office visit >> she is followed by DrFagin for Primary  Care.    Left parotid nodule excised by DrTeoh 8/16 => final path pending...    Asthma> on Advair100Bid & Proair prn; breathing is good w/o recent exac; also uses OTC Antihist & Flonase as needed for nasal symptoms...    HxPE/ DVT/ FactorV deficiency> on Coumadin via [place] [facility] & doing satis by her report; she denies leg pain, swelling, etc...     HBP> on Cardizem240, Cozaar100, Lasix20, ?Hygroton12.5, & K20; BP= 114/64 & similar at home; denies CP, palpit, ch in SOB or edema; she will check w/ PCP re: diuretics.    CHOL> on Simva10 + Feno160; FLP 3/15 showed TChol 153, TG 114, HDL 49, LDL 81 (labs in [place] by DrFagin)    Hx severe GERD> hx largeHH & severe GERD, followed by DrPatterson & eval at [facility] as well- symptoms improved on Nexium40Qd & antireflux regimen.    Divertics> on Citrucel daily; she had acute diverticulitis 2/13 treated by DrFagin in [place]; doing better since then & denies abd pain, n/v, c/d, blood seen...    Renal Insuffic> hx mild RI w/ BUN=37, Cr= 1.6 (stable)    GU- Neurogenic bladder> followed by Urology; on Methenamine500Bid to prevent bladder infections & improved; she used to self cath but has been voiding satis recently...    DJD, hyperuricemia, Osteopenia> Hx Cspine dis w/ myelopathy, followed by [facility] & improved; on Allopurinol300 w/ Uric= 3-4 range on this; she has developed severe left knee DJD & si told she needs a TKR but the surg in [place] is reluctant due to her hx DVT, Factor V Leiden defic => we will refer to DrAlusio for a seconed opinion consult...    Anxiety> on Zoloft100-1/2 daily...    Anemia> Hg stable in the 11 to 12 range... IMP/PLAN>>  Nancy Hicks' breathing is stable & she will continue the Advair regularly & the Ventolin prn;  We will refer her to Ortho for second opinion regarding her severe left knee DJD.  ~  December 04, 2015:  Yearly ROV & pulmonary follow up visit>  Her PCP is Dr. Asencion Noble, his notes are not avail to  review... Nancy Hicks reports that her breathing is good, denies prob w/ her asthma using Advair100- one inhalation daily & VentolinHFA rescue inhaler prn (hasn't needed);  She had left parotid nodule surg 10/2014 by DrTeoh & oncology eval by DrPenland (see below);  PET scan also showed some uptake in her colon and colonoscopy revealed several polyps- tubular adenomas on path...     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- s/p left parotidectomy w/ facial nerve dissection 10/2014 by DrTeoh;  She re[ports  that pt went to [facility] for colon polyp removal (path in [place] shows tubular adenomas); no known recurrence    Nancy Hicks saw DrTeoh, ENT 10/17/15 for f/u left parotid lymphoma> she had left parotidectomy 10/2014 & path showed atypical lymphoid tissue- path review indicated a low-grade non-hodgkin's B-cell lymphoma (favoring a marginal zone lymphoma);  She was seen by ONCOLOGY-DrPenland who recommended observation; bilat cerumen impactions removed;  We reviewed the following medical problems during today's office visit>      Left parotid nodule excised by DrTeoh 8/16 => low-grade non-hodgkin's B-cell lymphoma (favoring a marginal zone lymphoma) & oncology eval & f/u by DrPenland- she rec observation...    Asthma> on Advair100- pt decr to once/d & Proair prn; breathing is good w/o recent exac; also uses OTC Antihist & Flonase as needed for nasal symptoms...    HxPE/ DVT/ FactorV deficiency> on Coumadin via [place] [facility] & doing satis by her report; she denies leg pain, swelling, etc...     HBP> on Cardizem240, Cozaar100, Lasix20 & K20; DrFahan added HYGROTON25-1/2 tab daily; BP= 130/60 & similar at home; denies CP, palpit, ch in SOB or edema; she will check w/ PCP re: diuretics.    CHOL> on Simva10 + Feno160; last FLP in epic 3/15 showed TChol 153, TG 114, HDL 49, LDL 81 (f/u labs in [place] by DrFagin)    Hx  severe GERD> hx largeHH & severe GERD, followed by DrPatterson & eval at [facility] as well- symptoms improved on Nexium40Qd & antireflux regimen.    Divertics/ colon polyps> on Citrucel daily; she had acute diverticulitis 2/13 treated by DrFagin in [place]; doing better since then & denies abd pain, n/v, c/d, blood seen; she had PET uptake in colon leading to colonoscopy & polypectomy at [facility]- notes reviewed in Care Everywhere & tubular adenomas removed...    Hx Renal Insuffic> hx mild RI w/ BUN=37, Cr= 1.6 => improved to 1.0 range in more recent follow up...    GU- Neurogenic bladder> followed by Urology; on Methenamine500Bid to prevent bladder infections & improved; she used to self cath but has been voiding satis recently...    DJD, hyperuricemia, Osteopenia> Hx Cspine dis w/ myelopathy, followed by [facility] & improved; on Allopurinol300 w/ Uric= 3-4 range on this; she has developed severe left knee DJD & si told she needs a TKR but the surg in [place] is reluctant due to her hx DVT, Factor V Leiden defic => we will refer to DrAlusio for a seconed opinion consult=> received Synvisc w/ min benefit, she needs TKR but too risky, ambulates w/ cane...    Anxiety> on Zoloft50 & doing quite well...    Anemia> Hg stable in the 11 to 12 range... EXAM shows Afeb, VSS, O2sat=98% on RA;  HEENT- neg, mallampati3;  Chest- clear w/o w/r/r;  Heart- RR w/o m/r/g;  Abd- soft nontender, neg;  Ext- neg w/o c/c/e;  Neuro- intact w/ focal abn... IMP/PLAN>>  Nancy Hicks' asthma is under excellent control- continue sme meds; she needs the 2017 Flu vaccine & says she will get this from drFagan in Oct;  Call for any problems...  ~  December 07, 2016:  1year ROV & pulmonary/medical follow up visit>  Her PCP is Dr. Ihor Austin in [place], his notes are not avail to review... Nancy Hicks has no complaints today- specifically she notes her breathing is good, denies cough/ sput/ hemoptysis, no wheezing, notes mild stable chr DOE but no  prob w/ ADLs & her usual activity; she is not exercising &  we discussed this; she denies CP/ palpit/ dizzy/ edema...  We reviewed the following interval medical notes avail in the Epic-EMR>      She was ADM [facility] 10/10 - 01/09/16 by PCP DrFagan> presented w/ abd pain & GIB, dx w/ acute diverticulitis; disch summary reviewed-- her GI is DrThorne at [facility]; adenomatous polyps removed in past...    She saw ONCOLOGY- DrZhou on 07/13/16>  Followed for a low grade marginal zone lymphoma of the left parotid; BCL-2 & BCL-6 negative; B cells negative for CD10/ CD5/ and cyclin D1; B cells positive for CD20; left lateral parotidectomy with facial nerve dissection 11/14/2014 by Dr. Benjamine Mola;  Feeling well, no complaints- no appreciable nodule on exam;  She had PET Scan 12/21/14 showing interval resection of the left parotid, no hypermetabolic activity x a single focus in the sigmoid colon=> sent to GI & Adm w/ diverticulitis...    She had recent f/u w/ ENT- DrTeoh>  ?recurrent left parotid mass, they did CT scan w/ 4mm cystic mass identified & he plans Bx/ excision in October... We reviewed the following medical problems during today's office visit>      Left parotid nodule excised by DrTeoh 8/16 => low-grade non-hodgkin's B-cell lymphoma (favoring a marginal zone lymphoma) & oncology eval & f/u by DrPenland/ DrZhou- they rec observation only (no XRT or Chemo).    Asthma> on Advair100- pt decr to once/d & Proair prn; breathing is good w/o recent exac; also uses OTC Antihist & Flonase as needed for nasal symptoms...    HxPE/ DVT/ FactorV deficiency> on Coumadin via [place] [facility] (DrFagan) & doing satis by her report; she denies leg pain, swelling, etc...     HBP> on Cardizem240, Cozaar100, Lasix20 & K20; DrFahan added HYGROTON25-1/2 tab daily; BP= 130/60 & similar at home; denies CP, palpit, ch in SOB or edema; she will check w/ PCP re: diuretics.    CHOL> on Simva10 + Feno160; last FLP in epic 3/15 showed  TChol 153, TG 114, HDL 49, LDL 81 (f/u labs in [place] by DrFagin)    Hx severe GERD> hx largeHH & severe GERD, prev followed by DrPatterson & eval at [place] (DrThorne)- symptoms improved on Nexium40Qd & antireflux regimen.    Divertics/ colon polyps> on Citrucel daily; she had acute diverticulitis 2/13 & 10/17 treated by DrFagan in [place]; doing better since then & denies abd pain, n/v, c/d, blood seen; she had PET uptake in colon leading to colonoscopy & polypectomy at [facility]- notes reviewed in Care Everywhere & tubular adenomas removed...    Hx Renal Insuffic> hx mild RI w/ BUN=37, Cr= 1.6 => improved to 1.0 range in more recent follow up...    GU- Neurogenic bladder> followed by Urology; on Methenamine500Bid to prevent bladder infections & improved; she used to self cath but has been voiding satis recently...    DJD, hyperuricemia, Osteopenia> Hx Cspine dis w/ myelopathy, followed by [facility] & improved; on Allopurinol300 w/ Uric= 3-4 range on this; she has developed severe left knee DJD & si told she needs a TKR but the surg in [place] is reluctant due to her hx DVT, Factor V Leiden defic => we will refer to DrAlusio for a seconed opinion consult=> received Synvisc w/ min benefit, she needs TKR but too risky, ambulates w/ cane...    Anxiety> on Zoloft50 & doing quite well...    Anemia> Hg stable in the 11 to 12 range... EXAM shows Afeb, VSS, O2sat=97% on RA;  HEENT- neg, mallampati3;  Chest- clear w/o w/r/r;  Heart- RR w/o m/r/g;  Abd- soft nontender, neg;  Ext- +DJD w/o c/c/e;  Neuro- intact w/ focal abn... IMP/PLAN>>  Nancy Hicks is stable from the pulm standpoint- doing satis on Advair100- one inhalation daily; she has rescue inhaler for prn use; so far she has been able to avoid intercurrent infections or any exacerbations; continue same meds for now & call for any issues...  She will maintain f/u w/ DrFagan, DrZhou, DrTeoh, DrThorne...    ~  December 07, 2017:  51yr ROV & pulmonary follow up  visit>      CXR 12/07/17>  Norm heart size, very large HH, some peribronch thickening & interstitial prom but no acute changes- no infiltrate, nodules, fluid... IMP/PLAN>>              Problem List:   she had PNEUMOVAX 2004, & gets yearly Flu shots each Fall... TDAP given 8/11.  ASTHMA (ICD-493.90) - on ADVAIR 100Bid & VentolinHFA Prn... prev required O2 and Medrol but not in some time now- doing well- stable without cough, sputum, SOB, wheezing, CP, etc... prev on allergy vaccine & she stopped it... she has noted incr dyspnea w/ lack of exercise & improved back on exerc program. ~  NOTE:  large HH seen on CXR, & takes Nexium Tid... ~  CXR 2/11 showed large HH, DJD spine, clear lungs, NAD.Marland Kitchen. ~  CXR 2/13 showed large HH, DJD/ scoliosis of spine, NAD.Marland Kitchen. ~  8/13:  She is too sedentary & notes some DOE w/ ADLs; rec to enter cardio-pulm rehab program... ~  2/14: she reports that [place] has really helped; now doing her own exercises at home... ~  8/14: on Advair100Bid & Proair prn; breathing is good w/o recent exac; also uses OTC Antihist & Flonase as needed for nasal symptoms. ~  9/15: on Advair100Bid & AlbutHFA rescue inhaler prn; she reports a good 72mo interval w/o recurrent exac etc...  PULMONARY EMBOLISM, HX OF (ICD-V12.51) & FACTOR V DEFICIENCY (ICD-286.3) - last hosp in 9/06 for R heart cath to r/o pulmHTN & normal pressures were found... DEEP VENOUS THROMBOPHLEBITIS, HX OF (ICD-V12.52) - as above, & she wears TED hose, no edema... ~  DVT in right leg and PTE on CTAngio in 4/05 and treated w/ Hep/ COUMADIN (protimes followed in [place] by DrFagan)...   HYPERTENSION (ICD-401.9) - on DILTIAZEM 240mg /d, LOSARTAN 100mg /d, LASIX 40mg - 1/2 tab daily, & K20/d... ~  2DEcho 6/06 showed normal LV wall thicknees & LVF, ? mild pulm HTN w/ RVsys est 30-40 ~  8/12:  BP= 134/76 & she denies CP, palpit, dizzy, SOB, edema... ~  2/13:  BP= 140/60 & she remains asymptomatic... ~  2/14: on  Diltiazem240, Losartan50, Lasix20> BP= 130/70 today & she denies CP, palpit, ch in DOE, edema, etc ~  8/14: on Cardizem240, Cozaar100, Lasix40-1/2, & K20; BP= 142/84 & occas higher at home; may needs addition of BBlocker but holding off for now; denies CP, palpit, ch in SOB or edema. ~  9/15: on same meds w/ BP= 120/60 & she denies CP, palpit, SOB, dizzy, edema... ~  9/16: she lists the same meds + hygroton & she will check w/ her PCP- BP= 114/64 & she remains asymptomatic, no edema, renal insuffic stable.  HYPERCHOLESTEROLEMIA (ICD-272.0) - on SIMVASTATIN 10mg /d now & off prev FENOFIBRATE 160... ~  FLP 6/08 (on Lescol & Tricor) showed TChol 165, TG 94, HDL 49, LDL 97... rec- contin same. ~  [place] 7/09 showed TChol 152, TG  123, HDL 45, LDL 83... rec- change to Simva40 + Fenofib160 for $$. ~  FLP 7/10 showed TChol 160, TG 107, HDL 51, LDL 88... continue same. ~  [place] 8/11 (on Simva10+Feno160) showed TChol 154, TG 102, HDL 55, LDL 79... stable, same Rx. ~  [place] 2/12 (on Simva10+Feno160) showed TChol 152, TG 118, HDL 48, LDL 80 ~  FLP 2/13 (on Simva10+Feno160) showed TChol 172, TG 123, HDL 52, LDL 95 ~  FLP 2/14 on Simva10+Feno160 showed TChol 140, TG 125, HDL 44, LDL 71  ~  FLP 3/15 on simva20+Feno160 showed TChol 153, TG 114, HDL 49, LDL 81  GASTROESOPHAGEAL REFLUX DISEASE, SEVERE (ICD-530.81) - she has a large HH seen on routine CXRs; severe GERD followed by DrPatterson; on [place] 40mg - recent decr to 1/d... c/o gas & encouraged to take GasX, Mylicon, BeanO. ~  last EGD 6/06 showed large HH & stricture dilated... ~  GI eval for incr dysphagia 1/12 w/ EGD by DrPatterson showing large prolapsing HH, candida esoph, erosions & polyps> treated w/ Diflucan, Nexium decr to once daily. ~  She reports stable on Nexium 40mg  /d... ~  2013: She's had f/u w/ DrPatterson> chr GERD, intermit solid food dysphagia; he rec Ba Swallow, Manometry & 24H pH probe, and appt at [facility]... ~  7/13:  Ba Swallow showed web-like  narrowing in cerv esoph, presbyesoph, tortuous mid-distal esoph, largeHH, spasm distally, 3mm Ba tablet temporarily lodged in mid-esoph... ~  2/14: she reports much improved on Nexium40Bid... ~  She continues to f/u w/ GI at [facility] now that DrPatterson has retired; stable on antireflux regimen and Nexium40/d...  She has since f/u w/ GI- DrThorne at [facility]-- adenomatous clon polyps, acute diverticulitis w/ hosp...  DIVERTICULOSIS, COLON (ICD-562.10) - prev on LEVSIN 0.125mg  tid prn... ~  colonoscopy 10/00 showed divertics, no polyps. ~  GI f/u by [facility] 10/09- note reviewed... on CITRUCEL daily, & rec to take Phazyme for Gas. ~  [facility] 2/13 at [facility] by [facility] for Diverticulitis, treated w/ Cipro/ Flagyl & improved... ~  CT Abd&Pelvis 2/13 showed [place], s/p GB, kidney cysts, acute diverticulitis in prox sigmoid (Rx by DrFagin in [place]) & no complicating features...  RENAL INSUFFICIENCY (ICD-588.9) ~  labs 6/08 showed BUN= 43, Creat= 1.7 ~  labs 7/09 showed BUN= 35, Creat= 1.7 ~  labs 7/10 showed BUN= 51, Creat= 1.8 ~  labs 9/10 showed BUN= 33, Creat= 1.4 ~  labs 2/11 showed BUN= 32, Creat= 2.1, K= 4.6 ~  labs 8/11 showed BUN= 34, Creat= 1.5, K= 4.7 ~  labs 2/12 showed BUN= 37, creat= 1.5, K= 4.9 ~  Labs 2/13 showed BUN= 38, Creat= 1.5, K= 4.3 ~  Labs 2/14 showed BUN= 38, Creat= 1.4, K= 4.4 ~  Labs 3/15 showed BUN= 35, Cr= 1.6, K= 4.5 ~  Labs 8/16 showed BUN= 37, Cr= 1.6, K= 4.4  NEUROGENIC BLADDER (ICD-596.54) - eval and Rx by DrKimbrough in the past... prev had to self-cath regularly, & now voids satis on her own... still on MANDELAMINE for UTI prophylaxis per Urology.  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 also takes ALLOPURINOL 300mg /d for hyperuricemia... ~  labs 7/10 showed Uric= 4.3.Marland Kitchen. rec> continue Allopurinol. ~  labs 8/11 showed Uric=  3.8  OSTEOPENIA (ICD-733.90) - BMD from GYN= DrNeal w/ osteopenia Rx'd w/ ca++, MVI, VitD... ~  7/10: pt reports that DrNeal did VitD level & started  her on Vit D 2000 u daily... ~  labs 2/11 showed Vit D level = 54... ~  Labs 2/13 showed Vit D level = 48... Continue supplement.  ANXIETY (ICD-300.00) - on ZOLOFT 100mg - 1/2 tab Qhs...  ANEMIA (ICD-285.9) - hx of intol to all  P O forms of iron therapy... ~  labs 6/08 showed Hg= 11.1.Marland Kitchen. ~  labs 7/09 showed Hg= 10.3.Marland KitchenMarland Kitchen DrPatterson checked labs 10/09 w/ Fe= 77, Ferritin= 26, B12= 382. ~  labs 7/10 showed Hg= 11.0 ~  labs 2/11 showed Hg= 11.6 ~  labs 8/11 showed Hg= 11.9 ~  labs 2/12 showed Hg= 11.4 ~  Labs 2/13 showed Hg= 12.4, MCV= 96 ~  Labs 2/14 showed Hg= 11.5 ~  Labs 3/15 showed Hg= 11.9 ~  Labs 8/16 showed Hg= 11.7   Past Surgical History:  Procedure Laterality Date  . CHOLECYSTECTOMY    . COLONOSCOPY  2000   Dr. Verl Blalock: moderate to severe sigmoid diverticulosis  . COLONOSCOPY  03/2015   Dr. Peggye Form at [facility]: TCS for abnormal sigmoid colon on PET. Moderate diverticulosis of the sigmoid colon, sessile 8-9 mm sigmoid: Polyp biopsied (given anticoagulation), two 3 mm ICV polyps removed. All polyps were tubular adenomas.  . ESOPHAGEAL MANOMETRY  10/26/2011   Procedure: ESOPHAGEAL MANOMETRY (EM);  Surgeon: Sable Feil, MD;  Location: [facility];  Service: Endoscopy;  Laterality: N/A;  . ESOPHAGOGASTRODUODENOSCOPY  03/2010   Dr. Verl Blalock: candida esophagitis, prolapsing hh with cameron lesions, multiple polyps  . EYE SURGERY Bilateral    cataracts  . Flexible sigmoidoscopy  05/2015   Dr. Peggye Form at [facility]: Performed off anticoagulation. 1-1.5 cm sessile sigmoid polyp removed, site tattooed. Pathology revealed tubular adenoma. Colonoscopy in 3-5 years as medically appropriate.  Marland Kitchen PAROTIDECTOMY Left 11/14/2014  . PAROTIDECTOMY Left 11/14/2014   Procedure: LEFT LATERAL PAROTIDECTOMY;  Surgeon: Leta Baptist, MD;   Location: [facility];  Service: ENT;  Laterality: Left;  . 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 TONSILLECTOMY      Outpatient Encounter Medications as of 12/07/2017  Medication Sig  . ADVAIR DISKUS 100-50 MCG/DOSE AEPB take 1 tablet twice a day (Patient taking differently: Inhale 1 puff into the lungs 2 (two) times daily. )  . allopurinol (ZYLOPRIM) 300 MG tablet Take 300 mg by mouth daily.    . Biotin 5000 MCG CAPS Take 1 capsule by mouth daily.  . Calcium Citrate-Vitamin D (CITRACAL MAXIMUM) 315-250 MG-UNIT TABS Take 1 capsule by mouth daily.  . chlorthalidone (HYGROTON) 25 MG tablet TK 1 T PO QD  . Cholecalciferol (VITAMIN D3) 2000 UNITS TABS Take 1 tablet by mouth daily.    Marland Kitchen DILT-XR 240 MG 24 hr capsule Take 240 mg by mouth daily.  Marland Kitchen esomeprazole (NEXIUM) 40 MG capsule Take 40 mg by mouth daily before breakfast.   . fluticasone (FLONASE) 50 MCG/ACT nasal spray instill 2 sprays into each nostril once daily  . losartan (COZAAR) 100 MG tablet TK 1 T PO QD  . methenamine (HIPREX) 1 G tablet Take 500 mg by mouth 2 (two) times daily with a meal.   . methylcellulose (CITRUCEL) oral powder Take 1 tsp by mouth daily   . Multiple Vitamin (MULITIVITAMIN WITH MINERALS) TABS Take 1 tablet by mouth daily.  Vladimir Faster Glycol-Propyl Glycol (SYSTANE ULTRA PF) 0.4-0.3 % SOLN Place 2 drops into both eyes 3 (three) times daily.   . simvastatin (ZOCOR) 10 MG tablet Take 10 mg by mouth at bedtime.    Marland Kitchen  spironolactone (ALDACTONE) 25 MG tablet Take 12.5 mg by mouth daily.  . VENTOLIN HFA 108 (90 BASE) MCG/ACT inhaler inhale 2 puffs by mouth every 6 hours AS NEEDED FOR WHEEZING  . warfarin (COUMADIN) 2.5 MG tablet Take 2.5 mg by mouth every evening. Takes 2.5 mg everyday.  . [DISCONTINUED] lidocaine (LIDODERM) 5 % Place 1 patch onto the skin daily. Remove & Discard patch within 12 hours or as directed by MD  . [DISCONTINUED] senna-docusate (SENOKOT-S) 8.6-50 MG tablet Take 1 tablet by mouth 2 (two) times daily.   . [DISCONTINUED] tiZANidine (ZANAFLEX) 2 MG tablet Take 1 tablet (2 mg total) by mouth every 8 (eight) hours as needed for muscle spasms.   No facility-administered encounter medications on file as of 12/07/2017.     Allergies  Allergen Reactions  . Metoclopramide Hcl Other (See Comments)    REACTION: causes uncontrolable leg movements  . Nitrofurantoin Nausea And Vomiting and Other (See Comments)    REACTION: dizziness    Current Medications, Allergies, Past Medical History, Past Surgical History, Family History, and Social History were reviewed in [employer] record.    Review of Systems    See HPI - all other systems neg except as noted...  The patient complains of dyspnea on exertion.  The patient denies anorexia, fever, weight loss, weight gain, vision loss, decreased hearing, hoarseness, chest pain, syncope, peripheral edema, prolonged cough, headaches, hemoptysis, abdominal pain, melena, hematochezia, severe indigestion/heartburn, hematuria, incontinence, muscle weakness, suspicious skin lesions, transient blindness, difficulty walking, depression, unusual weight change, abnormal bleeding, enlarged lymph nodes, and angioedema.     Objective:   Physical Exam     WD, WN, 84 y/o WF in NAD GENERAL:  Alert & oriented; pleasant & cooperative... HEENT:  [place]/AT, EOM-wnl, PERRLA, EACs-clear, TMs-wnl, NOSE-clear, THROAT-clear & wnl. NECK:  Supple w/ decrROM; no JVD; normal carotid impulses w/o bruits; no thyromegaly or nodules palpated; no lymphadenopathy. CHEST:  Clear to P & A; without wheezes/ rales/ or rhonchi. HEART:  regular rhythm; without murmurs/ rubs/ or gallops. ABDOMEN:  soft & nontender; normal bowel sounds; no organomegaly or masses detected. EXT: without deformities, mild arthritic changes; no varicose veins/ venous insuffic/ or edema. NEURO:  CN's intact;  no focal neuro deficits... DERM:  No lesions noted; no rash etc...  RADIOLOGY DATA:   Reviewed in the EPIC EMR & discussed w/ the patient...  LABORATORY DATA:  Reviewed in the EPIC EMR & discussed w/ the patient...   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  ASTHMA>  Stable on Advair, Ventolin; she is improved s/p cardio[facility]pulm REHAB at [facility]... Encouraged to  Maintain exercise program on her own... 12/04/15>  Nancy Hicks' asthma is under excellent control- continue sme meds; she needs the 2017 Flu vaccine & says she will get this from drFagan in Oct;  Call for any problems... 12/07/16>  Nancy Hicks is stable from the pulm standpoint- doing satis on Advair100- one inhalation daily; she has rescue inhaler for prn use; so far she has been able to avoid intercurrent infections or any exacerbations; continue same meds for now & call for any issues...  She will maintain f/u w/ DrFagan, DrZhou, DrTeoh, DrThorne   Hx DVT/ PTE/ Factor V defic>  Stable on Coumadin w/ protimes per DrFagan in [place]; continue same...  HBP>  Controlled on Cardizem, Losartan, Lasix; continue same...  CHOL>  Stable on  Simva + Fenofib and diet;  Continue same...  LargeHH/ GERD>  On NexiumBid now & followed by DrPatterson; continue same meds & vigorous antireflux program; he is in the process of further eval & referral to [facility]...  Divertics>  She had bout of acute diverticulitis 2/13 treated at [facility]; symptoms resolved & now on maintenance program...  Renal Insuffic, Hx of Neurogenic Bladder>  Stable on current meds & voiding satis w/o need to self cath, she still takes Mandelamine for UTI prevention...  DJD, Hyperuricemia, DDD, Osteopenia>  She remains on Allopurinol, calcium, MVI, Vit D... We will set up appt w/ DrAlusio for 2nd opinion.  Anemia>  Hg remains stable at 11-12 range...  Anxiety/ Depression>  On ZOLOFT 100mg  taking 1/2 Qhs...   Patient's Medications  New  Prescriptions   No medications on file  Previous Medications   ADVAIR DISKUS 100-50 MCG/DOSE AEPB    take 1 tablet twice a day   ALLOPURINOL (ZYLOPRIM) 300 MG TABLET    Take 300 mg by mouth daily.     BIOTIN 5000 MCG CAPS    Take 1 capsule by mouth daily.   CALCIUM CITRATE-VITAMIN D (CITRACAL MAXIMUM) 315-250 MG-UNIT TABS    Take 1 capsule by mouth daily.   CHLORTHALIDONE (HYGROTON) 25 MG TABLET    TK 1 T PO QD   CHOLECALCIFEROL (VITAMIN D3) 2000 UNITS TABS    Take 1 tablet by mouth daily.     DILT-XR 240 MG 24 HR CAPSULE    Take 240 mg by mouth daily.   ESOMEPRAZOLE (NEXIUM) 40 MG CAPSULE    Take 40 mg by mouth daily before breakfast.    FLUTICASONE (FLONASE) 50 MCG/ACT NASAL SPRAY    instill 2 sprays into each nostril once daily   LOSARTAN (COZAAR) 100 MG TABLET    TK 1 T PO QD   METHENAMINE (HIPREX) 1 G TABLET    Take 500 mg by mouth 2 (two) times daily with a meal.    METHYLCELLULOSE (CITRUCEL) ORAL POWDER    Take 1 tsp by mouth daily    MULTIPLE VITAMIN (MULITIVITAMIN WITH MINERALS) TABS    Take 1 tablet by mouth daily.   POLYETHYL GLYCOL-PROPYL GLYCOL (SYSTANE ULTRA PF) 0.4-0.3 % SOLN    Place 2 drops into both eyes 3 (three) times daily.    SIMVASTATIN (ZOCOR) 10 MG TABLET    Take 10 mg by mouth at bedtime.     SPIRONOLACTONE (ALDACTONE) 25 MG TABLET    Take 12.5 mg by mouth daily.   VENTOLIN HFA 108 (90 BASE) MCG/ACT INHALER    inhale 2 puffs by mouth every 6 hours AS NEEDED FOR WHEEZING   WARFARIN (COUMADIN) 2.5 MG TABLET    Take 2.5 mg by mouth every evening. Takes 2.5 mg everyday.  Modified Medications   No medications on file  Discontinued Medications   LIDOCAINE (LIDODERM) 5 %    Place 1 patch onto the skin daily. Remove & Discard patch within 12 hours or as directed by MD   SENNA-DOCUSATE (SENOKOT-S) 8.6-50 MG TABLET    Take 1 tablet by mouth 2 (two) times daily.   TIZANIDINE (ZANAFLEX) 2 MG TABLET    Take 1 tablet (2 mg total) by mouth every 8 (eight) hours as needed for  muscle spasms.

## 2017-12-14 ENCOUNTER — Ambulatory Visit: Payer: Medicare Other | Admitting: Urology

## 2017-12-14 DIAGNOSIS — N3020 Other chronic cystitis without hematuria: Secondary | ICD-10-CM | POA: Diagnosis not present

## 2018-01-04 ENCOUNTER — Ambulatory Visit: Payer: Medicare Other | Admitting: Orthopaedic Surgery

## 2018-01-04 ENCOUNTER — Encounter: Payer: Self-pay | Admitting: Orthopaedic Surgery

## 2018-01-04 DIAGNOSIS — M25562 Pain in left knee: Secondary | ICD-10-CM | POA: Diagnosis not present

## 2018-01-04 DIAGNOSIS — G8929 Other chronic pain: Secondary | ICD-10-CM | POA: Diagnosis not present

## 2018-01-04 NOTE — Progress Notes (Signed)
CC:  I have pain of my left knee. I would like an injection.  The patient has chronic pain of the left knee.  There is no recent trauma.  There is no redness.  Injections in the past have helped.  The knee has no redness, has an effusion and crepitus present.  ROM of the left knee is 0-100.  Impression:  Chronic knee pain left  Return: as needed  PROCEDURE NOTE:  The patient requests injections of the left knee, verbal consent was obtained.  The left knee was prepped appropriately after time out was performed.   Sterile technique was observed and injection of 1 cc of Depo-Medrol 40 mg with several cc's of plain xylocaine. Anesthesia was provided by ethyl chloride and a 20-gauge needle was used to inject the knee area. The injection was tolerated well.  A band aid dressing was applied.  The patient was advised to apply ice later today and tomorrow to the injection sight as needed.  Electronically Signed Sanjuana Kava, MD 10/8/20199:58 AM

## 2018-01-10 ENCOUNTER — Ambulatory Visit (INDEPENDENT_AMBULATORY_CARE_PROVIDER_SITE_OTHER): Payer: Medicare Other | Admitting: Otolaryngology

## 2018-01-10 DIAGNOSIS — D37030 Neoplasm of uncertain behavior of the parotid salivary glands: Secondary | ICD-10-CM | POA: Diagnosis not present

## 2018-01-20 ENCOUNTER — Inpatient Hospital Stay (HOSPITAL_COMMUNITY): Payer: Medicare Other | Attending: Internal Medicine

## 2018-01-20 DIAGNOSIS — I10 Essential (primary) hypertension: Secondary | ICD-10-CM | POA: Diagnosis not present

## 2018-01-20 DIAGNOSIS — C8589 Other specified types of non-Hodgkin lymphoma, extranodal and solid organ sites: Secondary | ICD-10-CM | POA: Diagnosis present

## 2018-01-20 LAB — CBC WITH DIFFERENTIAL/PLATELET
Abs Immature Granulocytes: 0.03 10*3/uL (ref 0.00–0.07)
Basophils Absolute: 0 10*3/uL (ref 0.0–0.1)
Basophils Relative: 0 %
EOS ABS: 0 10*3/uL (ref 0.0–0.5)
Eosinophils Relative: 1 %
HEMATOCRIT: 38.4 % (ref 36.0–46.0)
Hemoglobin: 12 g/dL (ref 12.0–15.0)
Immature Granulocytes: 0 %
LYMPHS ABS: 1.2 10*3/uL (ref 0.7–4.0)
Lymphocytes Relative: 15 %
MCH: 31.2 pg (ref 26.0–34.0)
MCHC: 31.3 g/dL (ref 30.0–36.0)
MCV: 99.7 fL (ref 80.0–100.0)
MONO ABS: 0.6 10*3/uL (ref 0.1–1.0)
MONOS PCT: 7 %
NRBC: 0 % (ref 0.0–0.2)
Neutro Abs: 6 10*3/uL (ref 1.7–7.7)
Neutrophils Relative %: 77 %
Platelets: 194 10*3/uL (ref 150–400)
RBC: 3.85 MIL/uL — ABNORMAL LOW (ref 3.87–5.11)
RDW: 14.9 % (ref 11.5–15.5)
WBC: 7.8 10*3/uL (ref 4.0–10.5)

## 2018-01-20 LAB — COMPREHENSIVE METABOLIC PANEL
ALT: 13 U/L (ref 0–44)
AST: 21 U/L (ref 15–41)
Albumin: 3.9 g/dL (ref 3.5–5.0)
Alkaline Phosphatase: 78 U/L (ref 38–126)
Anion gap: 8 (ref 5–15)
BILIRUBIN TOTAL: 0.7 mg/dL (ref 0.3–1.2)
BUN: 30 mg/dL — AB (ref 8–23)
CO2: 24 mmol/L (ref 22–32)
CREATININE: 1.03 mg/dL — AB (ref 0.44–1.00)
Calcium: 9.8 mg/dL (ref 8.9–10.3)
Chloride: 103 mmol/L (ref 98–111)
GFR calc Af Amer: 54 mL/min — ABNORMAL LOW (ref >60)
GFR, EST NON AFRICAN AMERICAN: 47 mL/min — AB (ref >60)
Glucose, Bld: 119 mg/dL — ABNORMAL HIGH (ref 70–99)
Potassium: 4.9 mmol/L (ref 3.5–5.1)
Sodium: 135 mmol/L (ref 135–145)
TOTAL PROTEIN: 6.8 g/dL (ref 6.5–8.1)

## 2018-01-20 LAB — LACTATE DEHYDROGENASE: LDH: 129 U/L (ref 98–192)

## 2018-01-27 ENCOUNTER — Other Ambulatory Visit: Payer: Self-pay

## 2018-01-27 ENCOUNTER — Inpatient Hospital Stay (HOSPITAL_BASED_OUTPATIENT_CLINIC_OR_DEPARTMENT_OTHER): Payer: Medicare Other | Admitting: Internal Medicine

## 2018-01-27 VITALS — BP 137/58 | HR 74 | Temp 98.4°F | Resp 16 | Wt 135.6 lb

## 2018-01-27 DIAGNOSIS — I10 Essential (primary) hypertension: Secondary | ICD-10-CM | POA: Diagnosis not present

## 2018-01-27 DIAGNOSIS — C8589 Other specified types of non-Hodgkin lymphoma, extranodal and solid organ sites: Secondary | ICD-10-CM

## 2018-01-27 NOTE — Progress Notes (Signed)
Diagnosis Marginal zone lymphoma of extranodal and solid organ sites [facility]) - Plan: CBC with Differential/Platelet, Comprehensive metabolic panel, Lactate dehydrogenase  Staging Cancer Staging No matching staging information was found for the patient.  Assessment and Plan:  1. 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  She was previously followed by Dr. Talbert Cage.  Last PET scan was done in September 2016 and showed evidence of resection of lymph node of left parotid gland.    Labs done 01/20/2018 reviewed and showed WBC 7.8 HB 12 plts 194,000.  Chemistries WNL with K+ 4.9 Cr 1 and normal LFTs.  LDH WNL at 129.   Pt had an ultrasound of the neck done 01/15/2017 that showed previously palpable lymph node was not visualized on ultrasound.  No palpable abnormalities noted on exam.  Pt is now  3 years out from diagnosis.  She will RTC in 6 months for follow-up and repeat labs.    2.  Hypertension.  BP  is 137/58.  Follow-up with PCP.  3.  Anemia.  Hemoglobin 12 which is WNL.   Will repeat labs on RTC.    Historical data obtained from note dated 07/28/2017:  83 y.o. female followed by Dr. 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 She is s/p Left lateral parotidectomy with facial nerve dissection 11/14/2014 with Dr. 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    Current Status:  Pt is seen today for follow-up.  She denies any  fever, chills, night sweats and has noted no adenopathy.  She continues to follow-up with Dr. Benjamine Mola.   Problem List Patient Active Problem List   Diagnosis Date Noted  . Left rib fracture [S22.32XA] 06/24/2017  . Rib fractures [S22.39XA] 06/24/2017  . History of DVT (deep vein thrombosis) [Z86.718] 12/07/2016  . Acute pain of right shoulder [M25.511]   . Acute  diverticulitis [K57.92] 01/07/2016  . Bright red rectal bleeding [K62.5] 01/07/2016  . Factor V deficiency ([place]) [D68.2] 01/07/2016  . CKD (chronic kidney disease) [N18.9] 01/07/2016  . Diverticulitis [K57.92] 01/07/2016  . Sigmoid diverticulitis [K57.32] 01/07/2016  . Diverticulitis of large intestine without perforation or abscess with bleeding [K57.33]   . Marginal zone lymphoma of extranodal and solid organ sites [facility]) [C85.89] 09/09/2015  . Hx pulmonary embolism [Z86.711] 04/23/2015  . H/O superficial parotidectomy [Z98.890] 11/14/2014  . Deep vein thrombosis of lower extremity ([place]) [I82.409] 07/27/2011  . Embolism, pulmonary with infarction ([place]) [I26.99] 07/27/2011  . Diverticulitis of colon (without mention of hemorrhage)(562.11) [K57.32] 06/09/2011  . GERD (gastroesophageal reflux disease) [K21.9] 06/09/2011  . Right rib fracture [S22.31XA] 06/09/2011  . Anticoagulant long-term use [Z79.01] 06/09/2011  . COPD with chronic bronchitis ([place]) [J44.9] 06/09/2011  . HH (hiatus hernia) [K44.9] 11/04/2010  . Dysphagia [R13.10] 03/11/2010  . RENAL INSUFFICIENCY [N25.9] 04/02/2008  . IBS [K58.9] 01/24/2008  . HYPERCHOLESTEROLEMIA [E78.00] 03/14/2007  . ASTHMA [J45.909] 03/14/2007  . NEUROGENIC BLADDER [N31.9] 03/14/2007  . DISC DISEASE, CERVICAL [M50.30] 03/14/2007  . DEEP VENOUS THROMBOPHLEBITIS, HX OF [Z86.72] 03/14/2007  . ANEMIA [D64.9] 01/10/2007  . ANXIETY [F41.1] 01/10/2007  . Essential hypertension [I10] 01/10/2007  . Colon, diverticulosis [K57.30] 01/10/2007  . DEGENERATIVE JOINT DISEASE [M19.90] 01/10/2007  . OSTEOPENIA [M89.9, M94.9] 01/10/2007    Past Medical History Past Medical History:  Diagnosis Date  . Anemia, unspecified   .  Anxiety state, unspecified   . Blood dyscrasia    RV factor def  . Cancer ([place])    skin  . Degeneration of cervical intervertebral disc   . Disorder of bone and cartilage, unspecified   . Diverticulosis of colon (without mention of  hemorrhage) 01/13/1999  . Esophageal reflux   . Esophageal stricture   . Factor V deficiency ([place])   . Factor V deficiency ([place])   . Hiatal hernia 04/09/2010   ? Paraesophageal component on chest CT 2006  . HTN (hypertension)   . Irritable bowel syndrome   . Neurogenic bladder   . Osteoarthrosis, unspecified whether generalized or localized, unspecified site   . Peripheral vascular disease (H[facility])    pe, dvt hx  . Personal history of thrombophlebitis   . Personal history of venous thrombosis and embolism   . Primary lymphoma of parotid gland ([place]) 2016  . Pure hypercholesterolemia   . Raynaud phenomenon   . Shortness of breath dyspnea   . Unspecified asthma(493.90)   . Unspecified disorder resulting from impaired renal function    cysts    Past Surgical History Past Surgical History:  Procedure Laterality Date  . CHOLECYSTECTOMY    . COLONOSCOPY  2000   Dr. Verl Blalock: moderate to severe sigmoid diverticulosis  . COLONOSCOPY  03/2015   Dr. Peggye Form at [facility]: TCS for abnormal sigmoid colon on PET. Moderate diverticulosis of the sigmoid colon, sessile 8-9 mm sigmoid: Polyp biopsied (given anticoagulation), two 3 mm ICV polyps removed. All polyps were tubular adenomas.  . ESOPHAGEAL MANOMETRY  10/26/2011   Procedure: ESOPHAGEAL MANOMETRY (EM);  Surgeon: Sable Feil, MD;  Location: [facility];  Service: Endoscopy;  Laterality: N/A;  . ESOPHAGOGASTRODUODENOSCOPY  03/2010   Dr. Verl Blalock: candida esophagitis, prolapsing hh with cameron lesions, multiple polyps  . EYE SURGERY Bilateral    cataracts  . Flexible sigmoidoscopy  05/2015   Dr. Peggye Form at [facility]: Performed off anticoagulation. 1-1.5 cm sessile sigmoid polyp removed, site tattooed. Pathology revealed tubular adenoma. Colonoscopy in 3-5 years as medically appropriate.  Marland Kitchen PAROTIDECTOMY Left 11/14/2014  . PAROTIDECTOMY Left 11/14/2014   Procedure: LEFT LATERAL PAROTIDECTOMY;  Surgeon: Leta Baptist, MD;   Location: [place];  Service: ENT;  Laterality: Left;  . 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 TONSILLECTOMY      Family History Family History  Problem Relation Age of Onset  . Parkinsonism Mother   . COPD Brother   . Asthma Brother   . Lung cancer Brother   . Heart attack Brother   . Diabetes Paternal Aunt      Social History  reports that she has never smoked. She has never used smokeless tobacco. She reports that she does not drink alcohol or use drugs.  Medications  Current Outpatient Medications:  .  ADVAIR DISKUS 100-50 MCG/DOSE AEPB, take 1 tablet twice a day (Patient taking differently: Inhale 1 puff into the lungs 2 (two) times daily. ), Disp: 60 each, Rfl: 11 .  allopurinol (ZYLOPRIM) 300 MG tablet, Take 300 mg by mouth daily.  , Disp: , Rfl:  .  Biotin 5000 MCG CAPS, Take 1 capsule by mouth daily., Disp: , Rfl:  .  Cholecalciferol (VITAMIN D3) 2000 UNITS TABS, Take 1 tablet by mouth daily.  , Disp: , Rfl:  .  DILT-XR 240 MG 24 hr capsule, Take 240 mg by mouth daily., Disp: , Rfl: 0 .  esomeprazole (NEXIUM) 40 MG capsule, Take 40  mg by mouth daily before breakfast. , Disp: , Rfl:  .  fluticasone (FLONASE) 50 MCG/ACT nasal spray, instill 2 sprays into each nostril once daily, Disp: 16 g, Rfl: 5 .  methenamine (HIPREX) 1 G tablet, Take 500 mg by mouth 2 (two) times daily with a meal. , Disp: , Rfl:  .  methylcellulose (CITRUCEL) oral powder, Take 1 tsp by mouth daily , Disp: , Rfl:  .  Multiple Vitamin (MULITIVITAMIN WITH MINERALS) TABS, Take 1 tablet by mouth daily., Disp: , Rfl:  .  Polyethyl Glycol-Propyl Glycol (SYSTANE ULTRA PF) 0.4-0.3 % SOLN, Place 2 drops into both eyes 3 (three) times daily. , Disp: , Rfl:  .  simvastatin (ZOCOR) 10 MG tablet, Take 10 mg by mouth at bedtime.  , Disp: , Rfl:  .  VENTOLIN HFA 108 (90 BASE) MCG/ACT inhaler, inhale 2 puffs by mouth every 6 hours AS NEEDED FOR WHEEZING, Disp: 18 Inhaler, Rfl: 2 .  warfarin (COUMADIN) 2.5 MG tablet, Take  2.5 mg by mouth every evening. Takes 2.5 mg everyday., Disp: , Rfl:   Allergies Metoclopramide hcl and Nitrofurantoin  Review of Systems Review of Systems - Oncology ROS negative   Physical Exam  Vitals Wt Readings from Last 3 Encounters:  01/27/18 135 lb 9.6 oz (61.5 kg)  12/07/17 134 lb (60.8 kg)  11/09/17 130 lb (59 kg)   Temp Readings from Last 3 Encounters:  01/27/18 98.4 F (36.9 C) (Oral)  12/07/17 98 F (36.7 C) (Oral)  11/09/17 98.1 F (36.7 C)   BP Readings from Last 3 Encounters:  01/27/18 (!) 137/58  12/07/17 132/64  11/09/17 (!) 148/72   Pulse Readings from Last 3 Encounters:  01/27/18 74  12/07/17 74  11/09/17 76   Constitutional: Well-developed, well-nourished, and in no distress.   HENT: Head: Normocephalic and atraumatic.  Mouth/Throat: No oropharyngeal exudate. Mucosa moist. Eyes: Pupils are equal, round, and reactive to light. Conjunctivae are normal. No scleral icterus.  Neck: Normal range of motion. Neck supple. No JVD present.  Cardiovascular: Normal rate, regular rhythm and normal heart sounds.  Exam reveals no gallop and no friction rub.   No murmur heard. Pulmonary/Chest: Effort normal and breath sounds normal. No respiratory distress. No wheezes.No rales.  Abdominal: Soft. Bowel sounds are normal. No distension. There is no tenderness. There is no guarding.  Musculoskeletal: No edema or tenderness.  Lymphadenopathy: No cervical, axillary or supraclavicular adenopathy.  Neurological: Alert and oriented to person, place, and time. No cranial nerve deficit.  Skin: Skin is warm and dry. No rash noted. No erythema. No pallor.  Psychiatric: Affect and judgment normal.   Labs No visits with results within 3 Day(s) from this visit.  Latest known visit with results is:  Appointment on 01/20/2018  Component Date Value Ref Range Status  . WBC 01/20/2018 7.8  4.0 - 10.5 K/uL Final  . RBC 01/20/2018 3.85* 3.87 - 5.11 MIL/uL Final  . Hemoglobin  01/20/2018 12.0  12.0 - 15.0 g/dL Final  . HCT 01/20/2018 38.4  36.0 - 46.0 % Final  . MCV 01/20/2018 99.7  80.0 - 100.0 fL Final  . MCH 01/20/2018 31.2  26.0 - 34.0 pg Final  . MCHC 01/20/2018 31.3  30.0 - 36.0 g/dL Final  . RDW 01/20/2018 14.9  11.5 - 15.5 % Final  . Platelets 01/20/2018 194  150 - 400 K/uL Final  . nRBC 01/20/2018 0.0  0.0 - 0.2 % Final  . Neutrophils Relative % 01/20/2018 77  %  Final  . Neutro Abs 01/20/2018 6.0  1.7 - 7.7 K/uL Final  . Lymphocytes Relative 01/20/2018 15  % Final  . Lymphs Abs 01/20/2018 1.2  0.7 - 4.0 K/uL Final  . Monocytes Relative 01/20/2018 7  % Final  . Monocytes Absolute 01/20/2018 0.6  0.1 - 1.0 K/uL Final  . Eosinophils Relative 01/20/2018 1  % Final  . Eosinophils Absolute 01/20/2018 0.0  0.0 - 0.5 K/uL Final  . Basophils Relative 01/20/2018 0  % Final  . Basophils Absolute 01/20/2018 0.0  0.0 - 0.1 K/uL Final  . Immature Granulocytes 01/20/2018 0  % Final  . Abs Immature Granulocytes 01/20/2018 0.03  0.00 - 0.07 K/uL Final   Performed at [facility] [address] [place], [place] [address]  . Sodium 01/20/2018 135  135 - 145 mmol/L Final  . Potassium 01/20/2018 4.9  3.5 - 5.1 mmol/L Final  . Chloride 01/20/2018 103  98 - 111 mmol/L Final  . CO2 01/20/2018 24  22 - 32 mmol/L Final  . Glucose, Bld 01/20/2018 119* 70 - 99 mg/dL Final  . BUN 01/20/2018 30* 8 - 23 mg/dL Final  . Creatinine, Ser 01/20/2018 1.03* 0.44 - 1.00 mg/dL Final  . Calcium 01/20/2018 9.8  8.9 - 10.3 mg/dL Final  . Total Protein 01/20/2018 6.8  6.5 - 8.1 g/dL Final  . Albumin 01/20/2018 3.9  3.5 - 5.0 g/dL Final  . AST 01/20/2018 21  15 - 41 U/L Final  . ALT 01/20/2018 13  0 - 44 U/L Final  . Alkaline Phosphatase 01/20/2018 78  38 - 126 U/L Final  . Total Bilirubin 01/20/2018 0.7  0.3 - 1.2 mg/dL Final  . GFR calc non Af Amer 01/20/2018 47* >60 mL/min Final  . GFR calc Af Amer 01/20/2018 54* >60 mL/min Final   Comment: (NOTE) The eGFR has been calculated using  the CKD EPI equation. This calculation has not been validated in all clinical situations. eGFR's persistently <60 mL/min signify possible Chronic Kidney Disease.   Georgiann Hahn gap 01/20/2018 8  5 - 15 Final   Performed at [facility], [address], [place], [place] [address]  . LDH 01/20/2018 129  98 - 192 U/L Final   Performed at [facility], [address], [place], [place] [address]     Pathology Orders Placed This Encounter  Procedures  . CBC with Differential/Platelet    Standing Status:   Future    Standing Expiration Date:   01/28/2020  . Comprehensive metabolic panel    Standing Status:   Future    Standing Expiration Date:   01/28/2020  . Lactate dehydrogenase    Standing Status:   Future    Standing Expiration Date:   01/28/2020       Zoila Shutter MD

## 2018-03-08 ENCOUNTER — Encounter: Payer: Self-pay | Admitting: Orthopaedic Surgery

## 2018-03-08 ENCOUNTER — Ambulatory Visit: Payer: Medicare Other | Admitting: Orthopaedic Surgery

## 2018-03-08 DIAGNOSIS — M25562 Pain in left knee: Secondary | ICD-10-CM | POA: Diagnosis not present

## 2018-03-08 DIAGNOSIS — G8929 Other chronic pain: Secondary | ICD-10-CM

## 2018-03-08 NOTE — Progress Notes (Signed)
CC:  I have pain of my left knee. I would like an injection.  The patient has chronic pain of the left knee.  There is no recent trauma.  There is no redness.  Injections in the past have helped.  The knee has no redness, has an effusion and crepitus present.  ROM of the left knee is 0-100.  Impression:  Chronic knee pain left  Return: prn  PROCEDURE NOTE:  The patient requests injections of the left knee, verbal consent was obtained.  The left knee was prepped appropriately after time out was performed.   Sterile technique was observed and injection of 1 cc of Depo-Medrol 40 mg with several cc's of plain xylocaine. Anesthesia was provided by ethyl chloride and a 20-gauge needle was used to inject the knee area. The injection was tolerated well.  A band aid dressing was applied.  The patient was advised to apply ice later today and tomorrow to the injection sight as needed.  Electronically Signed Sanjuana Kava, MD 12/10/20192:34 PM

## 2018-04-02 ENCOUNTER — Emergency Department (HOSPITAL_COMMUNITY): Payer: Medicare Other

## 2018-04-02 ENCOUNTER — Encounter (HOSPITAL_COMMUNITY): Payer: Self-pay

## 2018-04-02 ENCOUNTER — Emergency Department (HOSPITAL_COMMUNITY)
Admission: EM | Admit: 2018-04-02 | Discharge: 2018-04-03 | Disposition: A | Discharge status: Home / Self Care | Payer: Medicare Other | Attending: Emergency Medicine | Admitting: Emergency Medicine

## 2018-04-02 DIAGNOSIS — H547 Unspecified visual loss: Secondary | ICD-10-CM | POA: Diagnosis present

## 2018-04-02 DIAGNOSIS — N189 Chronic kidney disease, unspecified: Secondary | ICD-10-CM | POA: Insufficient documentation

## 2018-04-02 DIAGNOSIS — H5460 Unqualified visual loss, one eye, unspecified: Secondary | ICD-10-CM

## 2018-04-02 DIAGNOSIS — I6522 Occlusion and stenosis of left carotid artery: Secondary | ICD-10-CM | POA: Diagnosis not present

## 2018-04-02 DIAGNOSIS — I129 Hypertensive chronic kidney disease with stage 1 through stage 4 chronic kidney disease, or unspecified chronic kidney disease: Secondary | ICD-10-CM | POA: Diagnosis not present

## 2018-04-02 DIAGNOSIS — J449 Chronic obstructive pulmonary disease, unspecified: Secondary | ICD-10-CM | POA: Insufficient documentation

## 2018-04-02 DIAGNOSIS — Z79899 Other long term (current) drug therapy: Secondary | ICD-10-CM | POA: Diagnosis not present

## 2018-04-02 DIAGNOSIS — H43399 Other vitreous opacities, unspecified eye: Secondary | ICD-10-CM | POA: Diagnosis not present

## 2018-04-02 LAB — COMPREHENSIVE METABOLIC PANEL
ALT: 12 U/L (ref 0–44)
AST: 22 U/L (ref 15–41)
Albumin: 3.5 g/dL (ref 3.5–5.0)
Alkaline Phosphatase: 77 U/L (ref 38–126)
Anion gap: 8 (ref 5–15)
BUN: 25 mg/dL — ABNORMAL HIGH (ref 8–23)
CO2: 25 mmol/L (ref 22–32)
Calcium: 9.7 mg/dL (ref 8.9–10.3)
Chloride: 108 mmol/L (ref 98–111)
Creatinine, Ser: 1.04 mg/dL — ABNORMAL HIGH (ref 0.44–1.00)
GFR calc Af Amer: 55 mL/min — ABNORMAL LOW (ref >60)
GFR, EST NON AFRICAN AMERICAN: 48 mL/min — AB (ref >60)
Glucose, Bld: 146 mg/dL — ABNORMAL HIGH (ref 70–99)
Potassium: 4.6 mmol/L (ref 3.5–5.1)
Sodium: 141 mmol/L (ref 135–145)
Total Bilirubin: 0.3 mg/dL (ref 0.3–1.2)
Total Protein: 6.9 g/dL (ref 6.5–8.1)

## 2018-04-02 LAB — CBC
HCT: 39.7 % (ref 36.0–46.0)
HEMOGLOBIN: 12.5 g/dL (ref 12.0–15.0)
MCH: 30.9 pg (ref 26.0–34.0)
MCHC: 31.5 g/dL (ref 30.0–36.0)
MCV: 98 fL (ref 80.0–100.0)
Platelets: 259 10*3/uL (ref 150–400)
RBC: 4.05 MIL/uL (ref 3.87–5.11)
RDW: 14.6 % (ref 11.5–15.5)
WBC: 7.7 10*3/uL (ref 4.0–10.5)
nRBC: 0 % (ref 0.0–0.2)

## 2018-04-02 LAB — I-STAT CHEM 8, ED
BUN: 26 mg/dL — ABNORMAL HIGH (ref 8–23)
CALCIUM ION: 1.23 mmol/L (ref 1.15–1.40)
Chloride: 107 mmol/L (ref 98–111)
Creatinine, Ser: 1 mg/dL (ref 0.44–1.00)
Glucose, Bld: 136 mg/dL — ABNORMAL HIGH (ref 70–99)
HCT: 40 % (ref 36.0–46.0)
Hemoglobin: 13.6 g/dL (ref 12.0–15.0)
POTASSIUM: 4.3 mmol/L (ref 3.5–5.1)
Sodium: 141 mmol/L (ref 135–145)
TCO2: 24 mmol/L (ref 22–32)

## 2018-04-02 LAB — I-STAT TROPONIN, ED: Troponin i, poc: 0 ng/mL (ref 0.00–0.08)

## 2018-04-02 LAB — DIFFERENTIAL
ABS IMMATURE GRANULOCYTES: 0.03 10*3/uL (ref 0.00–0.07)
Basophils Absolute: 0 10*3/uL (ref 0.0–0.1)
Basophils Relative: 0 %
Eosinophils Absolute: 0.1 10*3/uL (ref 0.0–0.5)
Eosinophils Relative: 1 %
Immature Granulocytes: 0 %
LYMPHS ABS: 1 10*3/uL (ref 0.7–4.0)
Lymphocytes Relative: 13 %
Monocytes Absolute: 0.6 10*3/uL (ref 0.1–1.0)
Monocytes Relative: 8 %
Neutro Abs: 6 10*3/uL (ref 1.7–7.7)
Neutrophils Relative %: 78 %

## 2018-04-02 LAB — PROTIME-INR
INR: 1.94
Prothrombin Time: 21.9 seconds — ABNORMAL HIGH (ref 11.4–15.2)

## 2018-04-02 LAB — APTT: aPTT: 36 seconds (ref 24–36)

## 2018-04-02 MED ORDER — GADOBUTROL 1 MMOL/ML IV SOLN
6.0000 mL | Freq: Once | INTRAVENOUS | Status: AC | PRN
  Administered 2018-04-03: 6 mL via INTRAVENOUS

## 2018-04-02 MED ORDER — FLUORESCEIN SODIUM 1 MG OP STRP
2.0000 | ORAL_STRIP | Freq: Once | OPHTHALMIC | Status: AC
  Administered 2018-04-02: 2 via OPHTHALMIC
  Filled 2018-04-02: qty 2

## 2018-04-02 MED ORDER — TETRACAINE HCL 0.5 % OP SOLN
2.0000 [drp] | Freq: Once | OPHTHALMIC | Status: AC
  Administered 2018-04-02: 2 [drp] via OPHTHALMIC
  Filled 2018-04-02: qty 4

## 2018-04-02 NOTE — ED Provider Notes (Addendum)
[facility] EMERGENCY DEPARTMENT Provider Note   CSN: [id number] Arrival date & time: 04/02/18  1410     History   Chief Complaint Chief Complaint  Patient presents with  . Eye Problem    HPI Nancy Hicks is a 85 y.o. female with h/o HTN, COPD, GERD, factor V deficiency, PE on coumadin, presents for evaluation of right eye vision changes, sudden around 6 pm last night while she was cooking.  Describes it as sudden "gray film" over entire right eye vision.  Associated with seeing floaters.  Now cannot see anything other than gray. This was painless, sudden, persistent.  No associated headache, nausea, vomiting, eye redness or discharge. No interventions. No alleviating or aggravating factors. No h/o strokes or TIA in the past. H/o cataract surgery bilaterally many years ago.   HPI  Past Medical History:  Diagnosis Date  . Anemia, unspecified   . Anxiety state, unspecified   . Blood dyscrasia    RV factor def  . Cancer ([place])    skin  . Degeneration of cervical intervertebral disc   . Disorder of bone and cartilage, unspecified   . Diverticulosis of colon (without mention of hemorrhage) 01/13/1999  . Esophageal reflux   . Esophageal stricture   . Factor V deficiency ([place])   . Factor V deficiency ([place])   . Hiatal hernia 04/09/2010   ? Paraesophageal component on chest CT 2006  . HTN (hypertension)   . Irritable bowel syndrome   . Neurogenic bladder   . Osteoarthrosis, unspecified whether generalized or localized, unspecified site   . Peripheral vascular disease (HCC)    pe, dvt hx  . Personal history of thrombophlebitis   . Personal history of venous thrombosis and embolism   . 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 Pure hypercholesterolemia   . Raynaud phenomenon   . Shortness of breath dyspnea   . Unspecified asthma(493.90)   . Unspecified disorder resulting from impaired renal function    cysts    Patient Active Problem List   Diagnosis Date Noted  . Left rib fracture 06/24/2017  . Rib fractures 06/24/2017  . History of DVT (deep vein thrombosis) 12/07/2016  . Acute pain of right shoulder   . Acute diverticulitis 01/07/2016  . Bright red rectal bleeding 01/07/2016  . Factor V deficiency ([place]) 01/07/2016  . CKD (chronic kidney disease) 01/07/2016  . Diverticulitis 01/07/2016  . Sigmoid diverticulitis 01/07/2016  . Diverticulitis of large intestine without perforation or abscess with bleeding   . Marginal zone lymphoma of extranodal and solid organ sites [facility]) 09/09/2015  . Hx pulmonary embolism 04/23/2015  . H/O superficial parotidectomy 11/14/2014  . Deep vein thrombosis of lower extremity ([place]) 07/27/2011  . Embolism, pulmonary with infarction ([place]) 07/27/2011  . Diverticulitis of colon (without mention of hemorrhage)(562.11) 06/09/2011  . GERD (gastroesophageal reflux disease) 06/09/2011  . Right rib fracture 06/09/2011  . Anticoagulant long-term use 06/09/2011  . COPD with chronic bronchitis ([place]) 06/09/2011  . HH (hiatus hernia) 11/04/2010  . Dysphagia 03/11/2010  . RENAL INSUFFICIENCY 04/02/2008  . IBS 01/24/2008  . HYPERCHOLESTEROLEMIA 03/14/2007  . ASTHMA 03/14/2007  . NEUROGENIC BLADDER 03/14/2007  . [place] DISEASE, CERVICAL 03/14/2007  . DEEP VENOUS THROMBOPHLEBITIS, HX OF 03/14/2007  . ANEMIA 01/10/2007  . ANXIETY 01/10/2007  . Essential hypertension 01/10/2007  . Colon, diverticulosis 01/10/2007  . DEGENERATIVE JOINT DISEASE 01/10/2007  . OSTEOPENIA 01/10/2007    Past Surgical History:  Procedure Laterality Date  . CHOLECYSTECTOMY    .  COLONOSCOPY  2000   Dr. Verl Blalock: moderate to severe sigmoid diverticulosis  . COLONOSCOPY  03/2015   Dr. Peggye Form at [facility]: TCS for abnormal sigmoid colon on PET. Moderate diverticulosis of the sigmoid colon, sessile 8-9 mm sigmoid: Polyp biopsied (given anticoagulation), two 3 mm ICV polyps removed. All polyps were tubular adenomas.  .  ESOPHAGEAL MANOMETRY  10/26/2011   Procedure: ESOPHAGEAL MANOMETRY (EM);  Surgeon: Sable Feil, MD;  Location: [facility];  Service: Endoscopy;  Laterality: N/A;  . ESOPHAGOGASTRODUODENOSCOPY  03/2010   Dr. Verl Blalock: candida esophagitis, prolapsing hh with cameron lesions, multiple polyps  . EYE SURGERY Bilateral    cataracts  . Flexible sigmoidoscopy  05/2015   Dr. Peggye Form at [facility] Performed off anticoagulation. 1-1.5 cm sessile sigmoid polyp removed, site tattooed. Pathology revealed tubular adenoma. Colonoscopy in 3-5 years as medically appropriate.  Marland Kitchen PAROTIDECTOMY Left 11/14/2014  . PAROTIDECTOMY Left 11/14/2014   Procedure: LEFT LATERAL PAROTIDECTOMY;  Surgeon: Leta Baptist, MD;  Location: [facility];  Service: ENT;  Laterality: Left;  . 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 TONSILLECTOMY       OB History    Gravida  3   Para  2   Term  2   Preterm      AB  1   Living        SAB  1   TAB      Ectopic      Multiple      Live Births               Home Medications    Prior to Admission medications   Medication Sig Start Date End Date Taking? Authorizing Provider  ADVAIR DISKUS 100-50 MCG/DOSE AEPB take 1 tablet twice a day Patient taking differently: Inhale 1 puff into the lungs 2 (two) times daily.  01/01/17  Yes Noralee Space, MD  allopurinol (ZYLOPRIM) 300 MG tablet Take 300 mg by mouth daily.     Yes [provider]  Biotin 5000 MCG CAPS Take 1 capsule by mouth daily.   Yes [provider]  CARTIA XT 300 MG 24 hr capsule Take 300 mg by mouth daily.  02/03/18  Yes [provider]  chlorthalidone (HYGROTON) 25 MG tablet Take 25 mg by mouth daily.   Yes [provider]  Cholecalciferol (VITAMIN D3) 2000 UNITS TABS Take 1 tablet by mouth daily.     Yes [provider]  esomeprazole (NEXIUM) 40 MG capsule Take 40 mg by mouth daily before breakfast.  06/09/11  Yes Sable Feil, MD  fluticasone [facility])  50 MCG/ACT nasal spray instill 2 sprays into each nostril once daily Patient taking differently: Place 2 sprays into both nostrils daily as needed for allergies or rhinitis.  04/14/17  Yes Noralee Space, MD  losartan (COZAAR) 100 MG tablet Take 100 mg by mouth daily.  02/23/18  Yes [provider]  methenamine (HIPREX) 1 G tablet Take 500 mg by mouth 2 (two) times daily with a meal.  04/11/11  Yes [provider]  methylcellulose (CITRUCEL) oral powder Take 1 packet by mouth 3 (three) times daily. Take 1 tsp by mouth daily    Yes [provider]  Multiple Vitamin (MULITIVITAMIN WITH MINERALS) TABS Take 1 tablet by mouth daily.   Yes [provider]  Multiple Vitamins-Minerals (OCUVITE-LUTEIN PO) Take 1 tablet by mouth daily.   Yes [provider]  Polyethyl Glycol-Propyl Glycol (  SYSTANE ULTRA PF) 0.4-0.3 % SOLN Place 2 drops into both eyes 3 (three) times daily.    Yes [provider]  sertraline (ZOLOFT) 50 MG tablet Take 50 mg by mouth at bedtime.  03/03/18  Yes [provider]  simvastatin (ZOCOR) 10 MG tablet Take 10 mg by mouth at bedtime.     Yes [provider]  VENTOLIN HFA 108 (90 BASE) MCG/ACT inhaler inhale 2 puffs by mouth every 6 hours AS NEEDED FOR WHEEZING Patient taking differently: Inhale 2 puffs into the lungs every 6 (six) hours as needed for wheezing or shortness of breath.  11/09/14  Yes Noralee Space, MD  warfarin (COUMADIN) 2.5 MG tablet Take 2.5 mg by mouth every evening. Takes 2.5 mg everyday.   Yes [provider]    Family History Family History  Problem Relation Age of Onset  . Parkinsonism Mother   . COPD Brother   . Asthma Brother   . Lung cancer Brother   . Heart attack Brother   . Diabetes Paternal Aunt     Social History Social History   Tobacco Use  . Smoking status: Never Smoker  . Smokeless tobacco: Never Used  Substance Use Topics  . Alcohol use: No  . Drug use: No      Allergies   Metoclopramide hcl and Nitrofurantoin   Review of Systems Review of Systems  Eyes: Positive for visual disturbance.  All other systems reviewed and are negative.    Physical Exam Updated Vital Signs BP 124/70 (BP Location: Right Arm)   Pulse 83   Temp 98.2 F (36.8 C) (Oral)   Resp 18   SpO2 98%   Physical Exam Vitals signs and nursing note reviewed.  Constitutional:      General: She is not in acute distress.    Appearance: She is well-developed.     Comments: NAD.  HENT:     Head: Normocephalic and atraumatic.     Right Ear: External ear normal.     Left Ear: External ear normal.     Nose: Nose normal.  Eyes:     General: No scleral icterus.    Conjunctiva/sclera: Conjunctivae normal.     Pupils: Pupils are unequal.     Comments: R pupils smaller than right. R pupil approx 2 mm. L pupil approx 4 mm. Both round, reactive.   RIGHT EYE: PRRL.  Grossly intact visual fields (pt can see movement/shadows). No consensual photosensitivity. EOMs intact, painless. Upper/lower lids without erythema, edema, tenderness, warmth, palpable mass, lesions.  Conjunctiva and sclera white slightly injected with prominent vessels.  Clear tearing. No limbic flush.  IOP 13. Fluorescein uptake: none. Visual acuity: none, can see shadows and movement only. Pupil small and difficult to evaluate but no obvious cell or flare on slit lamp exam.   LEFT EYE: PRRL.  Visual fields intact. No consensual photosensitivity. EOMs intact, painless. Upper/lower lids without erythema, edema, tenderness, warmth, palpable mass, lesions.  Conjunctiva and sclera white without prominent vessels.  No limbic flush.  IOP 14. Fluorescein uptake: none. Visual acuity 10/16.  No cell or flare on slit lamp exam:.   Neck:     Musculoskeletal: Normal range of motion and neck supple.  Cardiovascular:     Rate and Rhythm: Normal rate and regular rhythm.     Heart sounds: Normal heart sounds, S1 normal and S2  normal. No murmur.  Pulmonary:     Effort: Pulmonary effort is normal.     Breath  sounds: Normal breath sounds. No decreased breath sounds or wheezing.  Musculoskeletal: Normal range of motion.        General: No deformity.  Skin:    General: Skin is warm and dry.     Capillary Refill: Capillary refill takes less than 2 seconds.  Neurological:     Mental Status: She is alert, oriented to person, place, and time and easily aroused.     Comments: Alert and oriented to self, place, time and event.  Speech is fluent without obvious dysarthria or aphasia. Strength 5/5 in upper and lower extremities   Sensation to light touch intact in bilateral face, upper and lower extremities Normal gait CN II-XII grossly intact bilaterally.   Psychiatric:        Behavior: Behavior normal. Behavior is cooperative.        Thought Content: Thought content normal.        Judgment: Judgment normal.      ED Treatments / Results  Labs (all labs ordered are listed, but only abnormal results are displayed) Labs Reviewed  PROTIME-INR - Abnormal; Notable for the following components:      Result Value   Prothrombin Time 21.9 (*)    All other components within normal limits  COMPREHENSIVE METABOLIC PANEL - Abnormal; Notable for the following components:   Glucose, Bld 146 (*)    BUN 25 (*)    Creatinine, Ser 1.04 (*)    GFR calc non Af Amer 48 (*)    GFR calc Af Amer 55 (*)    All other components within normal limits  SEDIMENTATION RATE - Abnormal; Notable for the following components:   Sed Rate 24 (*)    All other components within normal limits  I-STAT CHEM 8, ED - Abnormal; Notable for the following components:   BUN 26 (*)    Glucose, Bld 136 (*)    All other components within normal limits  APTT  CBC  DIFFERENTIAL  C-REACTIVE PROTEIN  I-STAT TROPONIN, ED  CBG MONITORING, ED    EKG EKG Interpretation  Date/Time:  Saturday April 02 2018 15:19:49 EST Ventricular Rate:  80 PR  Interval:  144 QRS Duration: 68 QT Interval:  386 QTC Calculation: 445 R Axis:   57 Text Interpretation:  Sinus rhythm with Premature atrial complexes Low voltage QRS Septal infarct , age undetermined Abnormal ECG No significant change since last tracing Confirmed by Theotis Burrow [phone]) on 04/03/2018 1:04:51 PM   Radiology Ct Head Wo Contrast  Result Date: 04/02/2018 CLINICAL DATA:  Acute onset loss of vision in the right eye last night at 7 p.m. EXAM: CT HEAD WITHOUT CONTRAST TECHNIQUE: Contiguous axial images were obtained from the base of the skull through the vertex without intravenous contrast. COMPARISON:  Head CT scan 06/24/2017. FINDINGS: Brain: No evidence of acute infarction, hemorrhage, hydrocephalus, extra-axial collection or mass lesion/mass effect. Atrophy and chronic microvascular ischemic change are noted. Vascular: No hyperdense vessel or unexpected calcification. Skull: Intact.  No focal lesion. Sinuses/Orbits: Status post cataract surgery. Small mucous retention cysts or polyps in the maxillary sinuses noted. Other: None. IMPRESSION: No acute abnormality. Atrophy and chronic microvascular ischemic change. Electronically Signed   By: Inge Rise M.D.   On: 04/02/2018 15:49   Mr Jodene Nam Head Wo Contrast  Result Date: 04/03/2018 CLINICAL DATA:  Initial evaluation for acute right ocular visual loss. EXAM: MRI HEAD WITHOUT AND WITH CONTRAST MRA HEAD WITHOUT CONTRAST MRA NECK WITHOUT AND WITH CONTRAST TECHNIQUE: Multiplanar, multiecho pulse sequences of  the brain and surrounding structures were obtained without intravenous contrast. Angiographic images of the Circle of Willis were obtained using MRA technique without intravenous contrast. Angiographic images of the neck were obtained using MRA technique without and with intravenous contrast. Carotid stenosis measurements (when applicable) are obtained utilizing NASCET criteria, using the distal internal carotid diameter as the  denominator. CONTRAST:  60 cc of Gadavist. COMPARISON:  Prior CT from earlier the same day. FINDINGS: MRI HEAD FINDINGS Diffuse prominence of the CSF containing spaces compatible with generalized age-related cerebral atrophy. Patchy and confluent T2/FLAIR hyperintensity within the periventricular and deep white matter both cerebral hemispheres most consistent with chronic small vessel ischemic disease, moderate nature. No abnormal foci of restricted diffusion to suggest acute or subacute ischemia. Gray-white matter differentiation maintained. No encephalomalacia to suggest chronic infarction. No foci of susceptibility artifact to suggest acute or chronic intracranial hemorrhage. No mass lesion, midline shift or mass effect. No hydrocephalus. No extra-axial fluid collection. Pituitary gland and suprasellar region within normal limits. Midline structures intact and normal. No abnormal enhancement. Major intracranial vascular flow voids maintained. Degenerative thickening at the tectorial membrane noted. Craniocervical junction otherwise unremarkable. Multilevel degenerative spondylolysis noted within the upper cervical spine with resultant mild diffuse spinal stenosis. Bone marrow signal intensity within normal limits. No scalp soft tissue abnormality. No mastoid effusion.  Inner ear structures grossly normal. MRI ORBITS FINDINGS Globes are symmetric in size with normal appearance and morphology bilaterally. Patient status post bilateral ocular lens replacement. Optic nerves symmetric in size in appearance. No intrinsic optic nerve edema or enhancement. No abnormality along the optic nerve sheaths. No abnormality at the orbital apices are about the cavernous sinus. Optic chiasm normally situated within the suprasellar cistern without abnormality. Visible optic radiations within normal limits. Extra-ocular muscles symmetric and normal in appearance bilaterally. Lacrimal glands normal. Superior orbital veins symmetric  and normal. Intraconal and extraconal fat well-maintained. Mild scattered mucosal thickening within the ethmoidal air cells. Hypoplastic right sphenoid sinus largely opacified. Paranasal sinuses are otherwise clear. Periorbital soft tissues are normal bilaterally. MRA HEAD FINDINGS ANTERIOR CIRCULATION: Distal cervical segments of the internal carotid arteries are widely patent with symmetric antegrade flow. Petrous, cavernous, and supraclinoid segments widely patent bilaterally. Origin of the ophthalmic arteries patent. ICA termini well perfused and symmetric. A1 segments patent bilaterally. Normal anterior communicating artery. Anterior cerebral arteries widely patent to their distal aspects without stenosis. M1 segments widely patent bilaterally. Normal MCA bifurcations. Distal MCA branches well perfused and symmetric. POSTERIOR CIRCULATION: Vertebral arteries widely patent to the vertebrobasilar junction. Right PICA patent proximally. Left PICA not visualized. Basilar widely patent to its distal aspect. Superior cerebral arteries patent bilaterally. Right PCA primarily supplied via the basilar. Hypoplastic left P1 with prominent right posterior communicating artery. PCAs widely patent to their distal aspects without stenosis. No intracranial aneurysm or other vascular abnormality. MRA NECK FINDINGS Visualized aortic arch normal in caliber with normal branch pattern. No hemodynamically significant stenosis about the origin of the great vessels. Visualized subclavian arteries widely patent bilaterally. Right common carotid artery tortuous proximally but widely patent to the bifurcation without stenosis. No significant atheromatous narrowing about the right bifurcation. Right ICA widely patent from the bifurcation to the circle-of-Willis without stenosis, dissection, or occlusion. Left common carotid artery mildly tortuous but widely patent to the bifurcation without stenosis. Short-segment mild atheromatous  narrowing at the left bifurcation of no more than 30-40% by NASCET criteria. Left ICA widely patent distally to the circle-of-Willis without stenosis, dissection, or occlusion. Both  vertebral arteries arise from the subclavian arteries. Vertebral arteries mildly tortuous but widely patent within the neck without stenosis or occlusion. Vertebral arteries largely codominant. IMPRESSION: MRI HEAD IMPRESSION: 1. No acute intracranial abnormality. 2. Age-related cerebral atrophy with moderate chronic microvascular ischemic disease. MRI ORBITS IMPRESSION: No acute abnormality identified about the orbits. No structural findings to explain patient's symptoms identified. MRA HEAD IMPRESSION: Normal intracranial MRA. No large vessel occlusion. No hemodynamically significant or correctable stenosis. MRA NECK IMPRESSION: 1. Mild atheromatous narrowing of approximately 30-40% at the origin of the left ICA. Left carotid artery system otherwise widely patent. 2. Widely patent right carotid artery system without significant stenosis. 3. Vertebral arteries widely patent within the neck. Electronically Signed   By: Jeannine Boga M.D.   On: 04/03/2018 02:37   Mr Angiogram Neck W Or Wo Contrast  Result Date: 04/03/2018 CLINICAL DATA:  Initial evaluation for acute right ocular visual loss. EXAM: MRI HEAD WITHOUT AND WITH CONTRAST MRA HEAD WITHOUT CONTRAST MRA NECK WITHOUT AND WITH CONTRAST TECHNIQUE: Multiplanar, multiecho pulse sequences of the brain and surrounding structures were obtained without intravenous contrast. Angiographic images of the Circle of Willis were obtained using MRA technique without intravenous contrast. Angiographic images of the neck were obtained using MRA technique without and with intravenous contrast. Carotid stenosis measurements (when applicable) are obtained utilizing NASCET criteria, using the distal internal carotid diameter as the denominator. CONTRAST:  60 cc of Gadavist. COMPARISON:  Prior  CT from earlier the same day. FINDINGS: MRI HEAD FINDINGS Diffuse prominence of the CSF containing spaces compatible with generalized age-related cerebral atrophy. Patchy and confluent T2/FLAIR hyperintensity within the periventricular and deep white matter both cerebral hemispheres most consistent with chronic small vessel ischemic disease, moderate nature. No abnormal foci of restricted diffusion to suggest acute or subacute ischemia. Gray-white matter differentiation maintained. No encephalomalacia to suggest chronic infarction. No foci of susceptibility artifact to suggest acute or chronic intracranial hemorrhage. No mass lesion, midline shift or mass effect. No hydrocephalus. No extra-axial fluid collection. Pituitary gland and suprasellar region within normal limits. Midline structures intact and normal. No abnormal enhancement. Major intracranial vascular flow voids maintained. Degenerative thickening at the tectorial membrane noted. Craniocervical junction otherwise unremarkable. Multilevel degenerative spondylolysis noted within the upper cervical spine with resultant mild diffuse spinal stenosis. Bone marrow signal intensity within normal limits. No scalp soft tissue abnormality. No mastoid effusion.  Inner ear structures grossly normal. MRI ORBITS FINDINGS Globes are symmetric in size with normal appearance and morphology bilaterally. Patient status post bilateral ocular lens replacement. Optic nerves symmetric in size in appearance. No intrinsic optic nerve edema or enhancement. No abnormality along the optic nerve sheaths. No abnormality at the orbital apices are about the cavernous sinus. Optic chiasm normally situated within the suprasellar cistern without abnormality. Visible optic radiations within normal limits. Extra-ocular muscles symmetric and normal in appearance bilaterally. Lacrimal glands normal. Superior orbital veins symmetric and normal. Intraconal and extraconal fat well-maintained. Mild  scattered mucosal thickening within the ethmoidal air cells. Hypoplastic right sphenoid sinus largely opacified. Paranasal sinuses are otherwise clear. Periorbital soft tissues are normal bilaterally. MRA HEAD FINDINGS ANTERIOR CIRCULATION: Distal cervical segments of the internal carotid arteries are widely patent with symmetric antegrade flow. Petrous, cavernous, and supraclinoid segments widely patent bilaterally. Origin of the ophthalmic arteries patent. ICA termini well perfused and symmetric. A1 segments patent bilaterally. Normal anterior communicating artery. Anterior cerebral arteries widely patent to their distal aspects without stenosis. M1 segments widely patent bilaterally. Normal MCA bifurcations.  Distal MCA branches well perfused and symmetric. POSTERIOR CIRCULATION: Vertebral arteries widely patent to the vertebrobasilar junction. Right PICA patent proximally. Left PICA not visualized. Basilar widely patent to its distal aspect. Superior cerebral arteries patent bilaterally. Right PCA primarily supplied via the basilar. Hypoplastic left P1 with prominent right posterior communicating artery. PCAs widely patent to their distal aspects without stenosis. No intracranial aneurysm or other vascular abnormality. MRA NECK FINDINGS Visualized aortic arch normal in caliber with normal branch pattern. No hemodynamically significant stenosis about the origin of the great vessels. Visualized subclavian arteries widely patent bilaterally. Right common carotid artery tortuous proximally but widely patent to the bifurcation without stenosis. No significant atheromatous narrowing about the right bifurcation. Right ICA widely patent from the bifurcation to the circle-of-Willis without stenosis, dissection, or occlusion. Left common carotid artery mildly tortuous but widely patent to the bifurcation without stenosis. Short-segment mild atheromatous narrowing at the left bifurcation of no more than 30-40% by NASCET  criteria. Left ICA widely patent distally to the circle-of-Willis without stenosis, dissection, or occlusion. Both vertebral arteries arise from the subclavian arteries. Vertebral arteries mildly tortuous but widely patent within the neck without stenosis or occlusion. Vertebral arteries largely codominant. IMPRESSION: MRI HEAD IMPRESSION: 1. No acute intracranial abnormality. 2. Age-related cerebral atrophy with moderate chronic microvascular ischemic disease. MRI ORBITS IMPRESSION: No acute abnormality identified about the orbits. No structural findings to explain patient's symptoms identified. MRA HEAD IMPRESSION: Normal intracranial MRA. No large vessel occlusion. No hemodynamically significant or correctable stenosis. MRA NECK IMPRESSION: 1. Mild atheromatous narrowing of approximately 30-40% at the origin of the left ICA. Left carotid artery system otherwise widely patent. 2. Widely patent right carotid artery system without significant stenosis. 3. Vertebral arteries widely patent within the neck. Electronically Signed   By: Jeannine Boga M.D.   On: 04/03/2018 02:37   Mr Jeri Cos WN Contrast  Result Date: 04/03/2018 CLINICAL DATA:  Initial evaluation for acute right ocular visual loss. EXAM: MRI HEAD WITHOUT AND WITH CONTRAST MRA HEAD WITHOUT CONTRAST MRA NECK WITHOUT AND WITH CONTRAST TECHNIQUE: Multiplanar, multiecho pulse sequences of the brain and surrounding structures were obtained without intravenous contrast. Angiographic images of the Circle of Willis were obtained using MRA technique without intravenous contrast. Angiographic images of the neck were obtained using MRA technique without and with intravenous contrast. Carotid stenosis measurements (when applicable) are obtained utilizing NASCET criteria, using the distal internal carotid diameter as the denominator. CONTRAST:  60 cc of Gadavist. COMPARISON:  Prior CT from earlier the same day. FINDINGS: MRI HEAD FINDINGS Diffuse prominence of  the CSF containing spaces compatible with generalized age-related cerebral atrophy. Patchy and confluent T2/FLAIR hyperintensity within the periventricular and deep white matter both cerebral hemispheres most consistent with chronic small vessel ischemic disease, moderate nature. No abnormal foci of restricted diffusion to suggest acute or subacute ischemia. Gray-white matter differentiation maintained. No encephalomalacia to suggest chronic infarction. No foci of susceptibility artifact to suggest acute or chronic intracranial hemorrhage. No mass lesion, midline shift or mass effect. No hydrocephalus. No extra-axial fluid collection. Pituitary gland and suprasellar region within normal limits. Midline structures intact and normal. No abnormal enhancement. Major intracranial vascular flow voids maintained. Degenerative thickening at the tectorial membrane noted. Craniocervical junction otherwise unremarkable. Multilevel degenerative spondylolysis noted within the upper cervical spine with resultant mild diffuse spinal stenosis. Bone marrow signal intensity within normal limits. No scalp soft tissue abnormality. No mastoid effusion.  Inner ear structures grossly normal. MRI ORBITS FINDINGS Globes are symmetric  in size with normal appearance and morphology bilaterally. Patient status post bilateral ocular lens replacement. Optic nerves symmetric in size in appearance. No intrinsic optic nerve edema or enhancement. No abnormality along the optic nerve sheaths. No abnormality at the orbital apices are about the cavernous sinus. Optic chiasm normally situated within the suprasellar cistern without abnormality. Visible optic radiations within normal limits. Extra-ocular muscles symmetric and normal in appearance bilaterally. Lacrimal glands normal. Superior orbital veins symmetric and normal. Intraconal and extraconal fat well-maintained. Mild scattered mucosal thickening within the ethmoidal air cells. Hypoplastic right  sphenoid sinus largely opacified. Paranasal sinuses are otherwise clear. Periorbital soft tissues are normal bilaterally. MRA HEAD FINDINGS ANTERIOR CIRCULATION: Distal cervical segments of the internal carotid arteries are widely patent with symmetric antegrade flow. Petrous, cavernous, and supraclinoid segments widely patent bilaterally. Origin of the ophthalmic arteries patent. ICA termini well perfused and symmetric. A1 segments patent bilaterally. Normal anterior communicating artery. Anterior cerebral arteries widely patent to their distal aspects without stenosis. M1 segments widely patent bilaterally. Normal MCA bifurcations. Distal MCA branches well perfused and symmetric. POSTERIOR CIRCULATION: Vertebral arteries widely patent to the vertebrobasilar junction. Right PICA patent proximally. Left PICA not visualized. Basilar widely patent to its distal aspect. Superior cerebral arteries patent bilaterally. Right PCA primarily supplied via the basilar. Hypoplastic left P1 with prominent right posterior communicating artery. PCAs widely patent to their distal aspects without stenosis. No intracranial aneurysm or other vascular abnormality. MRA NECK FINDINGS Visualized aortic arch normal in caliber with normal branch pattern. No hemodynamically significant stenosis about the origin of the great vessels. Visualized subclavian arteries widely patent bilaterally. Right common carotid artery tortuous proximally but widely patent to the bifurcation without stenosis. No significant atheromatous narrowing about the right bifurcation. Right ICA widely patent from the bifurcation to the circle-of-Willis without stenosis, dissection, or occlusion. Left common carotid artery mildly tortuous but widely patent to the bifurcation without stenosis. Short-segment mild atheromatous narrowing at the left bifurcation of no more than 30-40% by NASCET criteria. Left ICA widely patent distally to the circle-of-Willis without  stenosis, dissection, or occlusion. Both vertebral arteries arise from the subclavian arteries. Vertebral arteries mildly tortuous but widely patent within the neck without stenosis or occlusion. Vertebral arteries largely codominant. IMPRESSION: MRI HEAD IMPRESSION: 1. No acute intracranial abnormality. 2. Age-related cerebral atrophy with moderate chronic microvascular ischemic disease. MRI ORBITS IMPRESSION: No acute abnormality identified about the orbits. No structural findings to explain patient's symptoms identified. MRA HEAD IMPRESSION: Normal intracranial MRA. No large vessel occlusion. No hemodynamically significant or correctable stenosis. MRA NECK IMPRESSION: 1. Mild atheromatous narrowing of approximately 30-40% at the origin of the left ICA. Left carotid artery system otherwise widely patent. 2. Widely patent right carotid artery system without significant stenosis. 3. Vertebral arteries widely patent within the neck. Electronically Signed   By: Jeannine Boga M.D.   On: 04/03/2018 02:37   Mr Rosealee Albee JG Contrast  Result Date: 04/03/2018 CLINICAL DATA:  Initial evaluation for acute right ocular visual loss. EXAM: MRI HEAD WITHOUT AND WITH CONTRAST MRA HEAD WITHOUT CONTRAST MRA NECK WITHOUT AND WITH CONTRAST TECHNIQUE: Multiplanar, multiecho pulse sequences of the brain and surrounding structures were obtained without intravenous contrast. Angiographic images of the Circle of Willis were obtained using MRA technique without intravenous contrast. Angiographic images of the neck were obtained using MRA technique without and with intravenous contrast. Carotid stenosis measurements (when applicable) are obtained utilizing NASCET criteria, using the distal internal carotid diameter as the denominator. CONTRAST:  60 cc of Gadavist. COMPARISON:  Prior CT from earlier the same day. FINDINGS: MRI HEAD FINDINGS Diffuse prominence of the CSF containing spaces compatible with generalized age-related  cerebral atrophy. Patchy and confluent T2/FLAIR hyperintensity within the periventricular and deep white matter both cerebral hemispheres most consistent with chronic small vessel ischemic disease, moderate nature. No abnormal foci of restricted diffusion to suggest acute or subacute ischemia. Gray-white matter differentiation maintained. No encephalomalacia to suggest chronic infarction. No foci of susceptibility artifact to suggest acute or chronic intracranial hemorrhage. No mass lesion, midline shift or mass effect. No hydrocephalus. No extra-axial fluid collection. Pituitary gland and suprasellar region within normal limits. Midline structures intact and normal. No abnormal enhancement. Major intracranial vascular flow voids maintained. Degenerative thickening at the tectorial membrane noted. Craniocervical junction otherwise unremarkable. Multilevel degenerative spondylolysis noted within the upper cervical spine with resultant mild diffuse spinal stenosis. Bone marrow signal intensity within normal limits. No scalp soft tissue abnormality. No mastoid effusion.  Inner ear structures grossly normal. MRI ORBITS FINDINGS Globes are symmetric in size with normal appearance and morphology bilaterally. Patient status post bilateral ocular lens replacement. Optic nerves symmetric in size in appearance. No intrinsic optic nerve edema or enhancement. No abnormality along the optic nerve sheaths. No abnormality at the orbital apices are about the cavernous sinus. Optic chiasm normally situated within the suprasellar cistern without abnormality. Visible optic radiations within normal limits. Extra-ocular muscles symmetric and normal in appearance bilaterally. Lacrimal glands normal. Superior orbital veins symmetric and normal. Intraconal and extraconal fat well-maintained. Mild scattered mucosal thickening within the ethmoidal air cells. Hypoplastic right sphenoid sinus largely opacified. Paranasal sinuses are otherwise  clear. Periorbital soft tissues are normal bilaterally. MRA HEAD FINDINGS ANTERIOR CIRCULATION: Distal cervical segments of the internal carotid arteries are widely patent with symmetric antegrade flow. Petrous, cavernous, and supraclinoid segments widely patent bilaterally. Origin of the ophthalmic arteries patent. ICA termini well perfused and symmetric. A1 segments patent bilaterally. Normal anterior communicating artery. Anterior cerebral arteries widely patent to their distal aspects without stenosis. M1 segments widely patent bilaterally. Normal MCA bifurcations. Distal MCA branches well perfused and symmetric. POSTERIOR CIRCULATION: Vertebral arteries widely patent to the vertebrobasilar junction. Right PICA patent proximally. Left PICA not visualized. Basilar widely patent to its distal aspect. Superior cerebral arteries patent bilaterally. Right PCA primarily supplied via the basilar. Hypoplastic left P1 with prominent right posterior communicating artery. PCAs widely patent to their distal aspects without stenosis. No intracranial aneurysm or other vascular abnormality. MRA NECK FINDINGS Visualized aortic arch normal in caliber with normal branch pattern. No hemodynamically significant stenosis about the origin of the great vessels. Visualized subclavian arteries widely patent bilaterally. Right common carotid artery tortuous proximally but widely patent to the bifurcation without stenosis. No significant atheromatous narrowing about the right bifurcation. Right ICA widely patent from the bifurcation to the circle-of-Willis without stenosis, dissection, or occlusion. Left common carotid artery mildly tortuous but widely patent to the bifurcation without stenosis. Short-segment mild atheromatous narrowing at the left bifurcation of no more than 30-40% by NASCET criteria. Left ICA widely patent distally to the circle-of-Willis without stenosis, dissection, or occlusion. Both vertebral arteries arise from the  subclavian arteries. Vertebral arteries mildly tortuous but widely patent within the neck without stenosis or occlusion. Vertebral arteries largely codominant. IMPRESSION: MRI HEAD IMPRESSION: 1. No acute intracranial abnormality. 2. Age-related cerebral atrophy with moderate chronic microvascular ischemic disease. MRI ORBITS IMPRESSION: No acute abnormality identified about the orbits. No structural findings to explain patient's symptoms  identified. MRA HEAD IMPRESSION: Normal intracranial MRA. No large vessel occlusion. No hemodynamically significant or correctable stenosis. MRA NECK IMPRESSION: 1. Mild atheromatous narrowing of approximately 30-40% at the origin of the left ICA. Left carotid artery system otherwise widely patent. 2. Widely patent right carotid artery system without significant stenosis. 3. Vertebral arteries widely patent within the neck. Electronically Signed   By: Jeannine Boga M.D.   On: 04/03/2018 02:37    Procedures Procedures (including critical care time)  Medications Ordered in ED Medications  tetracaine (PONTOCAINE) 0.5 % ophthalmic solution 2 drop (2 drops Both Eyes Given 04/02/18 1655)  fluorescein ophthalmic strip 2 strip (2 strips Both Eyes Given 04/02/18 1655)  gadobutrol (GADAVIST) 1 MMOL/ML injection 6 mL (6 mLs Intravenous Contrast Given 04/03/18 0016)     Initial Impression / Assessment and Plan / ED Course  I have reviewed the triage vital signs and the nursing notes.  Pertinent labs & imaging results that were available during my care of the patient were reviewed by me and considered in my medical decision making (see chart for details).  Clinical Course as of Apr 03 1510  Sat Apr 02, 2018  1915 Spoke to Dr Cheral Marker. Recommends eye exam.  Agrees with MRI/MRA.     [CG]  2019 Spoke to Dr Coralyn Pear.  He recommends going forward with imaging, call him back with results for further recommendations.    [CG]  4196 Spoke to MRI tech pt en route to MRI now     [CG]  2316 Plan:  Pt pending MRI/MRA.  Reconsult Ophtho with results regardless. If positive will consult Neuro again as well.     [HM]  Sun Apr 03, 2018  0520 Discussed with Dr. Coralyn Pear, he will see her in the office this morning at 6:30am   [HM]    Clinical Course User Index [CG] Kinnie Feil, PA-C [HM] Muthersbaugh, Jarrett Soho, PA-C    Highest on ddx is retinal detachment. Ddx also includes lens dislocation vs vitreous hemorrhage vs RA or RV occlusion.  Labs and head CT at triage reviewed and unremarkable.  Plan to do slit exam, IOPs, visual acuity and bedside US to visualized retina.    2255: Pt now en route to MRI. Neurology and ophthalmology has been consulted.  Plan is to obtain MRI/MRAs. Consider admission for concerning findings on MRI or RAO for stroke work up.  Dr Coralyn Pear would like reconsult with MRI/MRA results regardless. Family has been updated.  Final Clinical Impressions(s) / ED Diagnoses   Final diagnoses:  Monocular vision loss    ED Discharge Orders    None       Kinnie Feil, PA-C 04/02/18 2257    Kinnie Feil, PA-C 04/03/18 1512    Margette Fast, MD 04/04/18 1734

## 2018-04-02 NOTE — ED Provider Notes (Signed)
Care assumed from [place], [place].  Please see her full H&P.  In short,  Nancy Hicks is a 85 y.o. female presents for sudden onset monocular vision loss yesterday at 6pm.  Initial work-up with Neg head CT, reassuring labs.  Consult with Ophtho and Neurology who recommend MRI/MRA.  Neither have yet evaluated the patient. Pt with otherwise normal neuro exam by initial provider.  Vision out of the right eye is shadows only.    Physical Exam  BP (!) 159/64   Pulse 71   Temp 98.2 F (36.8 C) (Oral)   Resp 16   SpO2 95%   Physical Exam Vitals signs and nursing note reviewed.  Constitutional:      General: She is not in acute distress.    Appearance: She is well-developed.  HENT:     Head: Normocephalic.  Eyes:     General: No scleral icterus.    Conjunctiva/sclera: Conjunctivae normal.  Neck:     Musculoskeletal: Normal range of motion.  Cardiovascular:     Rate and Rhythm: Normal rate.  Pulmonary:     Effort: Pulmonary effort is normal.  Musculoskeletal: Normal range of motion.  Skin:    General: Skin is warm and dry.  Neurological:     Mental Status: She is alert.     ED Course/Procedures   Clinical Course as of Apr 03 521  Sat Apr 02, 2018  1915 Spoke to Dr Cheral Marker. Recommends eye exam.  Agrees with MRI/MRA.     [CG]  2019 Spoke to Dr Coralyn Pear.  He recommends going forward with imaging, call him back with results for further recommendations.    [CG]  5498 Spoke to MRI tech pt en route to MRI now    [CG]  2316 Plan:  Pt pending MRI/MRA.  Reconsult Ophtho with results regardless. If positive will consult Neuro again as well.     [HM]  Sun Apr 03, 2018  0520 Discussed with Dr. Coralyn Pear, he will see her in the office this morning at 6:30am   [HM]    Clinical Course User Index [CG] Kinnie Feil, PA-C [HM] Jatorian Renault, Gwenlyn Perking    Results for orders placed or performed during the hospital encounter of 04/02/18  Protime-INR  Result Value Ref  Range   Prothrombin Time 21.9 (H) 11.4 - 15.2 seconds   INR 1.94   APTT  Result Value Ref Range   aPTT 36 24 - 36 seconds  CBC  Result Value Ref Range   WBC 7.7 4.0 - 10.5 K/uL   RBC 4.05 3.87 - 5.11 MIL/uL   Hemoglobin 12.5 12.0 - 15.0 g/dL   HCT 39.7 36.0 - 46.0 %   MCV 98.0 80.0 - 100.0 fL   MCH 30.9 26.0 - 34.0 pg   MCHC 31.5 30.0 - 36.0 g/dL   RDW 14.6 11.5 - 15.5 %   Platelets 259 150 - 400 K/uL   nRBC 0.0 0.0 - 0.2 %  Differential  Result Value Ref Range   Neutrophils Relative % 78 %   Neutro Abs 6.0 1.7 - 7.7 K/uL   Lymphocytes Relative 13 %   Lymphs Abs 1.0 0.7 - 4.0 K/uL   Monocytes Relative 8 %   Monocytes Absolute 0.6 0.1 - 1.0 K/uL   Eosinophils Relative 1 %   Eosinophils Absolute 0.1 0.0 - 0.5 K/uL   Basophils Relative 0 %   Basophils Absolute 0.0 0.0 - 0.1 K/uL   Immature Granulocytes 0 %  Abs Immature Granulocytes 0.03 0.00 - 0.07 K/uL  Comprehensive metabolic panel  Result Value Ref Range   Sodium 141 135 - 145 mmol/L   Potassium 4.6 3.5 - 5.1 mmol/L   Chloride 108 98 - 111 mmol/L   CO2 25 22 - 32 mmol/L   Glucose, Bld 146 (H) 70 - 99 mg/dL   BUN 25 (H) 8 - 23 mg/dL   Creatinine, Ser 1.04 (H) 0.44 - 1.00 mg/dL   Calcium 9.7 8.9 - 10.3 mg/dL   Total Protein 6.9 6.5 - 8.1 g/dL   Albumin 3.5 3.5 - 5.0 g/dL   AST 22 15 - 41 U/L   ALT 12 0 - 44 U/L   Alkaline Phosphatase 77 38 - 126 U/L   Total Bilirubin 0.3 0.3 - 1.2 mg/dL   GFR calc non Af Amer 48 (L) >60 mL/min   GFR calc Af Amer 55 (L) >60 mL/min   Anion gap 8 5 - 15  I-stat troponin, ED  Result Value Ref Range   Troponin i, poc 0.00 0.00 - 0.08 ng/mL   Comment 3          I-Stat Chem 8, ED  Result Value Ref Range   Sodium 141 135 - 145 mmol/L   Potassium 4.3 3.5 - 5.1 mmol/L   Chloride 107 98 - 111 mmol/L   BUN 26 (H) 8 - 23 mg/dL   Creatinine, Ser 1.00 0.44 - 1.00 mg/dL   Glucose, Bld 136 (H) 70 - 99 mg/dL   Calcium, Ion 1.23 1.15 - 1.40 mmol/L   TCO2 24 22 - 32 mmol/L   Hemoglobin  13.6 12.0 - 15.0 g/dL   HCT 40.0 36.0 - 46.0 %   Ct Head Wo Contrast  Result Date: 04/02/2018 CLINICAL DATA:  Acute onset loss of vision in the right eye last night at 7 p.m. EXAM: CT HEAD WITHOUT CONTRAST TECHNIQUE: Contiguous axial images were obtained from the base of the skull through the vertex without intravenous contrast. COMPARISON:  Head CT scan 06/24/2017. FINDINGS: Brain: No evidence of acute infarction, hemorrhage, hydrocephalus, extra-axial collection or mass lesion/mass effect. Atrophy and chronic microvascular ischemic change are noted. Vascular: No hyperdense vessel or unexpected calcification. Skull: Intact.  No focal lesion. Sinuses/Orbits: Status post cataract surgery. Small mucous retention cysts or polyps in the maxillary sinuses noted. Other: None. IMPRESSION: No acute abnormality. Atrophy and chronic microvascular ischemic change. Electronically Signed   By: Inge Rise M.D.   On: 04/02/2018 15:49   Mr Jodene Nam Head Wo Contrast  Result Date: 04/03/2018 CLINICAL DATA:  Initial evaluation for acute right ocular visual loss. EXAM: MRI HEAD WITHOUT AND WITH CONTRAST MRA HEAD WITHOUT CONTRAST MRA NECK WITHOUT AND WITH CONTRAST TECHNIQUE: Multiplanar, multiecho pulse sequences of the brain and surrounding structures were obtained without intravenous contrast. Angiographic images of the Circle of Willis were obtained using MRA technique without intravenous contrast. Angiographic images of the neck were obtained using MRA technique without and with intravenous contrast. Carotid stenosis measurements (when applicable) are obtained utilizing NASCET criteria, using the distal internal carotid diameter as the denominator. CONTRAST:  60 cc of Gadavist. COMPARISON:  Prior CT from earlier the same day. FINDINGS: MRI HEAD FINDINGS Diffuse prominence of the CSF containing spaces compatible with generalized age-related cerebral atrophy. Patchy and confluent T2/FLAIR hyperintensity within the  periventricular and deep white matter both cerebral hemispheres most consistent with chronic small vessel ischemic disease, moderate nature. No abnormal foci of restricted diffusion to suggest acute or subacute  ischemia. Gray-white matter differentiation maintained. No encephalomalacia to suggest chronic infarction. No foci of susceptibility artifact to suggest acute or chronic intracranial hemorrhage. No mass lesion, midline shift or mass effect. No hydrocephalus. No extra-axial fluid collection. Pituitary gland and suprasellar region within normal limits. Midline structures intact and normal. No abnormal enhancement. Major intracranial vascular flow voids maintained. Degenerative thickening at the tectorial membrane noted. Craniocervical junction otherwise unremarkable. Multilevel degenerative spondylolysis noted within the upper cervical spine with resultant mild diffuse spinal stenosis. Bone marrow signal intensity within normal limits. No scalp soft tissue abnormality. No mastoid effusion.  Inner ear structures grossly normal. MRI ORBITS FINDINGS Globes are symmetric in size with normal appearance and morphology bilaterally. Patient status post bilateral ocular lens replacement. Optic nerves symmetric in size in appearance. No intrinsic optic nerve edema or enhancement. No abnormality along the optic nerve sheaths. No abnormality at the orbital apices are about the cavernous sinus. Optic chiasm normally situated within the suprasellar cistern without abnormality. Visible optic radiations within normal limits. Extra-ocular muscles symmetric and normal in appearance bilaterally. Lacrimal glands normal. Superior orbital veins symmetric and normal. Intraconal and extraconal fat well-maintained. Mild scattered mucosal thickening within the ethmoidal air cells. Hypoplastic right sphenoid sinus largely opacified. Paranasal sinuses are otherwise clear. Periorbital soft tissues are normal bilaterally. MRA HEAD FINDINGS  ANTERIOR CIRCULATION: Distal cervical segments of the internal carotid arteries are widely patent with symmetric antegrade flow. Petrous, cavernous, and supraclinoid segments widely patent bilaterally. Origin of the ophthalmic arteries patent. ICA termini well perfused and symmetric. A1 segments patent bilaterally. Normal anterior communicating artery. Anterior cerebral arteries widely patent to their distal aspects without stenosis. M1 segments widely patent bilaterally. Normal MCA bifurcations. Distal MCA branches well perfused and symmetric. POSTERIOR CIRCULATION: Vertebral arteries widely patent to the vertebrobasilar junction. Right PICA patent proximally. Left PICA not visualized. Basilar widely patent to its distal aspect. Superior cerebral arteries patent bilaterally. Right PCA primarily supplied via the basilar. Hypoplastic left P1 with prominent right posterior communicating artery. PCAs widely patent to their distal aspects without stenosis. No intracranial aneurysm or other vascular abnormality. MRA NECK FINDINGS Visualized aortic arch normal in caliber with normal branch pattern. No hemodynamically significant stenosis about the origin of the great vessels. Visualized subclavian arteries widely patent bilaterally. Right common carotid artery tortuous proximally but widely patent to the bifurcation without stenosis. No significant atheromatous narrowing about the right bifurcation. Right ICA widely patent from the bifurcation to the circle-of-Willis without stenosis, dissection, or occlusion. Left common carotid artery mildly tortuous but widely patent to the bifurcation without stenosis. Short-segment mild atheromatous narrowing at the left bifurcation of no more than 30-40% by NASCET criteria. Left ICA widely patent distally to the circle-of-Willis without stenosis, dissection, or occlusion. Both vertebral arteries arise from the subclavian arteries. Vertebral arteries mildly tortuous but widely patent  within the neck without stenosis or occlusion. Vertebral arteries largely codominant. IMPRESSION: MRI HEAD IMPRESSION: 1. No acute intracranial abnormality. 2. Age-related cerebral atrophy with moderate chronic microvascular ischemic disease. MRI ORBITS IMPRESSION: No acute abnormality identified about the orbits. No structural findings to explain patient's symptoms identified. MRA HEAD IMPRESSION: Normal intracranial MRA. No large vessel occlusion. No hemodynamically significant or correctable stenosis. MRA NECK IMPRESSION: 1. Mild atheromatous narrowing of approximately 30-40% at the origin of the left ICA. Left carotid artery system otherwise widely patent. 2. Widely patent right carotid artery system without significant stenosis. 3. Vertebral arteries widely patent within the neck. Electronically Signed   By: Jeannine Boga  M.D.   On: 04/03/2018 02:37   Mr Angiogram Neck W Or Wo Contrast  Result Date: 04/03/2018 CLINICAL DATA:  Initial evaluation for acute right ocular visual loss. EXAM: MRI HEAD WITHOUT AND WITH CONTRAST MRA HEAD WITHOUT CONTRAST MRA NECK WITHOUT AND WITH CONTRAST TECHNIQUE: Multiplanar, multiecho pulse sequences of the brain and surrounding structures were obtained without intravenous contrast. Angiographic images of the Circle of Willis were obtained using MRA technique without intravenous contrast. Angiographic images of the neck were obtained using MRA technique without and with intravenous contrast. Carotid stenosis measurements (when applicable) are obtained utilizing NASCET criteria, using the distal internal carotid diameter as the denominator. CONTRAST:  60 cc of Gadavist. COMPARISON:  Prior CT from earlier the same day. FINDINGS: MRI HEAD FINDINGS Diffuse prominence of the CSF containing spaces compatible with generalized age-related cerebral atrophy. Patchy and confluent T2/FLAIR hyperintensity within the periventricular and deep white matter both cerebral hemispheres most  consistent with chronic small vessel ischemic disease, moderate nature. No abnormal foci of restricted diffusion to suggest acute or subacute ischemia. Gray-white matter differentiation maintained. No encephalomalacia to suggest chronic infarction. No foci of susceptibility artifact to suggest acute or chronic intracranial hemorrhage. No mass lesion, midline shift or mass effect. No hydrocephalus. No extra-axial fluid collection. Pituitary gland and suprasellar region within normal limits. Midline structures intact and normal. No abnormal enhancement. Major intracranial vascular flow voids maintained. Degenerative thickening at the tectorial membrane noted. Craniocervical junction otherwise unremarkable. Multilevel degenerative spondylolysis noted within the upper cervical spine with resultant mild diffuse spinal stenosis. Bone marrow signal intensity within normal limits. No scalp soft tissue abnormality. No mastoid effusion.  Inner ear structures grossly normal. MRI ORBITS FINDINGS Globes are symmetric in size with normal appearance and morphology bilaterally. Patient status post bilateral ocular lens replacement. Optic nerves symmetric in size in appearance. No intrinsic optic nerve edema or enhancement. No abnormality along the optic nerve sheaths. No abnormality at the orbital apices are about the cavernous sinus. Optic chiasm normally situated within the suprasellar cistern without abnormality. Visible optic radiations within normal limits. Extra-ocular muscles symmetric and normal in appearance bilaterally. Lacrimal glands normal. Superior orbital veins symmetric and normal. Intraconal and extraconal fat well-maintained. Mild scattered mucosal thickening within the ethmoidal air cells. Hypoplastic right sphenoid sinus largely opacified. Paranasal sinuses are otherwise clear. Periorbital soft tissues are normal bilaterally. MRA HEAD FINDINGS ANTERIOR CIRCULATION: Distal cervical segments of the internal  carotid arteries are widely patent with symmetric antegrade flow. Petrous, cavernous, and supraclinoid segments widely patent bilaterally. Origin of the ophthalmic arteries patent. ICA termini well perfused and symmetric. A1 segments patent bilaterally. Normal anterior communicating artery. Anterior cerebral arteries widely patent to their distal aspects without stenosis. M1 segments widely patent bilaterally. Normal MCA bifurcations. Distal MCA branches well perfused and symmetric. POSTERIOR CIRCULATION: Vertebral arteries widely patent to the vertebrobasilar junction. Right PICA patent proximally. Left PICA not visualized. Basilar widely patent to its distal aspect. Superior cerebral arteries patent bilaterally. Right PCA primarily supplied via the basilar. Hypoplastic left P1 with prominent right posterior communicating artery. PCAs widely patent to their distal aspects without stenosis. No intracranial aneurysm or other vascular abnormality. MRA NECK FINDINGS Visualized aortic arch normal in caliber with normal branch pattern. No hemodynamically significant stenosis about the origin of the great vessels. Visualized subclavian arteries widely patent bilaterally. Right common carotid artery tortuous proximally but widely patent to the bifurcation without stenosis. No significant atheromatous narrowing about the right bifurcation. Right ICA widely patent from the bifurcation  to the circle-of-Willis without stenosis, dissection, or occlusion. Left common carotid artery mildly tortuous but widely patent to the bifurcation without stenosis. Short-segment mild atheromatous narrowing at the left bifurcation of no more than 30-40% by NASCET criteria. Left ICA widely patent distally to the circle-of-Willis without stenosis, dissection, or occlusion. Both vertebral arteries arise from the subclavian arteries. Vertebral arteries mildly tortuous but widely patent within the neck without stenosis or occlusion. Vertebral  arteries largely codominant. IMPRESSION: MRI HEAD IMPRESSION: 1. No acute intracranial abnormality. 2. Age-related cerebral atrophy with moderate chronic microvascular ischemic disease. MRI ORBITS IMPRESSION: No acute abnormality identified about the orbits. No structural findings to explain patient's symptoms identified. MRA HEAD IMPRESSION: Normal intracranial MRA. No large vessel occlusion. No hemodynamically significant or correctable stenosis. MRA NECK IMPRESSION: 1. Mild atheromatous narrowing of approximately 30-40% at the origin of the left ICA. Left carotid artery system otherwise widely patent. 2. Widely patent right carotid artery system without significant stenosis. 3. Vertebral arteries widely patent within the neck. Electronically Signed   By: Jeannine Boga M.D.   On: 04/03/2018 02:37   Mr Jeri Cos TM Contrast  Result Date: 04/03/2018 CLINICAL DATA:  Initial evaluation for acute right ocular visual loss. EXAM: MRI HEAD WITHOUT AND WITH CONTRAST MRA HEAD WITHOUT CONTRAST MRA NECK WITHOUT AND WITH CONTRAST TECHNIQUE: Multiplanar, multiecho pulse sequences of the brain and surrounding structures were obtained without intravenous contrast. Angiographic images of the Circle of Willis were obtained using MRA technique without intravenous contrast. Angiographic images of the neck were obtained using MRA technique without and with intravenous contrast. Carotid stenosis measurements (when applicable) are obtained utilizing NASCET criteria, using the distal internal carotid diameter as the denominator. CONTRAST:  60 cc of Gadavist. COMPARISON:  Prior CT from earlier the same day. FINDINGS: MRI HEAD FINDINGS Diffuse prominence of the CSF containing spaces compatible with generalized age-related cerebral atrophy. Patchy and confluent T2/FLAIR hyperintensity within the periventricular and deep white matter both cerebral hemispheres most consistent with chronic small vessel ischemic disease, moderate  nature. No abnormal foci of restricted diffusion to suggest acute or subacute ischemia. Gray-white matter differentiation maintained. No encephalomalacia to suggest chronic infarction. No foci of susceptibility artifact to suggest acute or chronic intracranial hemorrhage. No mass lesion, midline shift or mass effect. No hydrocephalus. No extra-axial fluid collection. Pituitary gland and suprasellar region within normal limits. Midline structures intact and normal. No abnormal enhancement. Major intracranial vascular flow voids maintained. Degenerative thickening at the tectorial membrane noted. Craniocervical junction otherwise unremarkable. Multilevel degenerative spondylolysis noted within the upper cervical spine with resultant mild diffuse spinal stenosis. Bone marrow signal intensity within normal limits. No scalp soft tissue abnormality. No mastoid effusion.  Inner ear structures grossly normal. MRI ORBITS FINDINGS Globes are symmetric in size with normal appearance and morphology bilaterally. Patient status post bilateral ocular lens replacement. Optic nerves symmetric in size in appearance. No intrinsic optic nerve edema or enhancement. No abnormality along the optic nerve sheaths. No abnormality at the orbital apices are about the cavernous sinus. Optic chiasm normally situated within the suprasellar cistern without abnormality. Visible optic radiations within normal limits. Extra-ocular muscles symmetric and normal in appearance bilaterally. Lacrimal glands normal. Superior orbital veins symmetric and normal. Intraconal and extraconal fat well-maintained. Mild scattered mucosal thickening within the ethmoidal air cells. Hypoplastic right sphenoid sinus largely opacified. Paranasal sinuses are otherwise clear. Periorbital soft tissues are normal bilaterally. MRA HEAD FINDINGS ANTERIOR CIRCULATION: Distal cervical segments of the internal carotid arteries are widely patent with  symmetric antegrade flow.  Petrous, cavernous, and supraclinoid segments widely patent bilaterally. Origin of the ophthalmic arteries patent. ICA termini well perfused and symmetric. A1 segments patent bilaterally. Normal anterior communicating artery. Anterior cerebral arteries widely patent to their distal aspects without stenosis. M1 segments widely patent bilaterally. Normal MCA bifurcations. Distal MCA branches well perfused and symmetric. POSTERIOR CIRCULATION: Vertebral arteries widely patent to the vertebrobasilar junction. Right PICA patent proximally. Left PICA not visualized. Basilar widely patent to its distal aspect. Superior cerebral arteries patent bilaterally. Right PCA primarily supplied via the basilar. Hypoplastic left P1 with prominent right posterior communicating artery. PCAs widely patent to their distal aspects without stenosis. No intracranial aneurysm or other vascular abnormality. MRA NECK FINDINGS Visualized aortic arch normal in caliber with normal branch pattern. No hemodynamically significant stenosis about the origin of the great vessels. Visualized subclavian arteries widely patent bilaterally. Right common carotid artery tortuous proximally but widely patent to the bifurcation without stenosis. No significant atheromatous narrowing about the right bifurcation. Right ICA widely patent from the bifurcation to the circle-of-Willis without stenosis, dissection, or occlusion. Left common carotid artery mildly tortuous but widely patent to the bifurcation without stenosis. Short-segment mild atheromatous narrowing at the left bifurcation of no more than 30-40% by NASCET criteria. Left ICA widely patent distally to the circle-of-Willis without stenosis, dissection, or occlusion. Both vertebral arteries arise from the subclavian arteries. Vertebral arteries mildly tortuous but widely patent within the neck without stenosis or occlusion. Vertebral arteries largely codominant. IMPRESSION: MRI HEAD IMPRESSION: 1. No  acute intracranial abnormality. 2. Age-related cerebral atrophy with moderate chronic microvascular ischemic disease. MRI ORBITS IMPRESSION: No acute abnormality identified about the orbits. No structural findings to explain patient's symptoms identified. MRA HEAD IMPRESSION: Normal intracranial MRA. No large vessel occlusion. No hemodynamically significant or correctable stenosis. MRA NECK IMPRESSION: 1. Mild atheromatous narrowing of approximately 30-40% at the origin of the left ICA. Left carotid artery system otherwise widely patent. 2. Widely patent right carotid artery system without significant stenosis. 3. Vertebral arteries widely patent within the neck. Electronically Signed   By: Jeannine Boga M.D.   On: 04/03/2018 02:37   Mr Rosealee Albee XB Contrast  Result Date: 04/03/2018 CLINICAL DATA:  Initial evaluation for acute right ocular visual loss. EXAM: MRI HEAD WITHOUT AND WITH CONTRAST MRA HEAD WITHOUT CONTRAST MRA NECK WITHOUT AND WITH CONTRAST TECHNIQUE: Multiplanar, multiecho pulse sequences of the brain and surrounding structures were obtained without intravenous contrast. Angiographic images of the Circle of Willis were obtained using MRA technique without intravenous contrast. Angiographic images of the neck were obtained using MRA technique without and with intravenous contrast. Carotid stenosis measurements (when applicable) are obtained utilizing NASCET criteria, using the distal internal carotid diameter as the denominator. CONTRAST:  60 cc of Gadavist. COMPARISON:  Prior CT from earlier the same day. FINDINGS: MRI HEAD FINDINGS Diffuse prominence of the CSF containing spaces compatible with generalized age-related cerebral atrophy. Patchy and confluent T2/FLAIR hyperintensity within the periventricular and deep white matter both cerebral hemispheres most consistent with chronic small vessel ischemic disease, moderate nature. No abnormal foci of restricted diffusion to suggest acute or  subacute ischemia. Gray-white matter differentiation maintained. No encephalomalacia to suggest chronic infarction. No foci of susceptibility artifact to suggest acute or chronic intracranial hemorrhage. No mass lesion, midline shift or mass effect. No hydrocephalus. No extra-axial fluid collection. Pituitary gland and suprasellar region within normal limits. Midline structures intact and normal. No abnormal enhancement. Major intracranial vascular flow voids maintained. Degenerative thickening  at the tectorial membrane noted. Craniocervical junction otherwise unremarkable. Multilevel degenerative spondylolysis noted within the upper cervical spine with resultant mild diffuse spinal stenosis. Bone marrow signal intensity within normal limits. No scalp soft tissue abnormality. No mastoid effusion.  Inner ear structures grossly normal. MRI ORBITS FINDINGS Globes are symmetric in size with normal appearance and morphology bilaterally. Patient status post bilateral ocular lens replacement. Optic nerves symmetric in size in appearance. No intrinsic optic nerve edema or enhancement. No abnormality along the optic nerve sheaths. No abnormality at the orbital apices are about the cavernous sinus. Optic chiasm normally situated within the suprasellar cistern without abnormality. Visible optic radiations within normal limits. Extra-ocular muscles symmetric and normal in appearance bilaterally. Lacrimal glands normal. Superior orbital veins symmetric and normal. Intraconal and extraconal fat well-maintained. Mild scattered mucosal thickening within the ethmoidal air cells. Hypoplastic right sphenoid sinus largely opacified. Paranasal sinuses are otherwise clear. Periorbital soft tissues are normal bilaterally. MRA HEAD FINDINGS ANTERIOR CIRCULATION: Distal cervical segments of the internal carotid arteries are widely patent with symmetric antegrade flow. Petrous, cavernous, and supraclinoid segments widely patent bilaterally.  Origin of the ophthalmic arteries patent. ICA termini well perfused and symmetric. A1 segments patent bilaterally. Normal anterior communicating artery. Anterior cerebral arteries widely patent to their distal aspects without stenosis. M1 segments widely patent bilaterally. Normal MCA bifurcations. Distal MCA branches well perfused and symmetric. POSTERIOR CIRCULATION: Vertebral arteries widely patent to the vertebrobasilar junction. Right PICA patent proximally. Left PICA not visualized. Basilar widely patent to its distal aspect. Superior cerebral arteries patent bilaterally. Right PCA primarily supplied via the basilar. Hypoplastic left P1 with prominent right posterior communicating artery. PCAs widely patent to their distal aspects without stenosis. No intracranial aneurysm or other vascular abnormality. MRA NECK FINDINGS Visualized aortic arch normal in caliber with normal branch pattern. No hemodynamically significant stenosis about the origin of the great vessels. Visualized subclavian arteries widely patent bilaterally. Right common carotid artery tortuous proximally but widely patent to the bifurcation without stenosis. No significant atheromatous narrowing about the right bifurcation. Right ICA widely patent from the bifurcation to the circle-of-Willis without stenosis, dissection, or occlusion. Left common carotid artery mildly tortuous but widely patent to the bifurcation without stenosis. Short-segment mild atheromatous narrowing at the left bifurcation of no more than 30-40% by NASCET criteria. Left ICA widely patent distally to the circle-of-Willis without stenosis, dissection, or occlusion. Both vertebral arteries arise from the subclavian arteries. Vertebral arteries mildly tortuous but widely patent within the neck without stenosis or occlusion. Vertebral arteries largely codominant. IMPRESSION: MRI HEAD IMPRESSION: 1. No acute intracranial abnormality. 2. Age-related cerebral atrophy with  moderate chronic microvascular ischemic disease. MRI ORBITS IMPRESSION: No acute abnormality identified about the orbits. No structural findings to explain patient's symptoms identified. MRA HEAD IMPRESSION: Normal intracranial MRA. No large vessel occlusion. No hemodynamically significant or correctable stenosis. MRA NECK IMPRESSION: 1. Mild atheromatous narrowing of approximately 30-40% at the origin of the left ICA. Left carotid artery system otherwise widely patent. 2. Widely patent right carotid artery system without significant stenosis. 3. Vertebral arteries widely patent within the neck. Electronically Signed   By: Jeannine Boga M.D.   On: 04/03/2018 02:37     Procedures    MDM   Presents with monocular vision loss.  Ophthalmology and neurology were consulted.  Questionable abnormality on the CT scan.  MRI/MRA was obtained.  Orbits appear within normal limits.  No specific evidence of retinal artery occlusion.  No swelling of the optic nerve.  Some narrowing of the left ICA but left carotid artery system is widely patent.  No evidence of stroke on MRI.  Discussed with Dr. Coralyn Pear who would like to see patient in the office this morning for eye exam.  Discussed with patient who is amenable to this.  CRP and ESR were added to lab work to rule out giant cell arteritis.  Patient reports no significant improvement in her vision.  5:38 AM Pt to be discharged to Dr. Dairl Ponder office.  ESR and Sed Rate pending.  Dr. Coralyn Pear to follow labs.  Discussed with patient who states understanding.      Monocular vision loss      Agapito Games 04/03/18 [place], MD 04/03/18 [phone]

## 2018-04-02 NOTE — ED Notes (Signed)
Visual acuity screening complete:  Results at 25ft: Left eye 10/16.                          Right eye does no perceive nothing in visual field.

## 2018-04-02 NOTE — ED Notes (Signed)
Patient transported to MRI 

## 2018-04-02 NOTE — ED Triage Notes (Signed)
Onset last night 7pm pt was standing in kitchen, lost vision in right eye.  Pt can see light, floaters, and a film but unable to make out any objects.  PERRLA.

## 2018-04-02 NOTE — ED Notes (Signed)
ED Provider at bedside. 

## 2018-04-03 ENCOUNTER — Encounter (INDEPENDENT_AMBULATORY_CARE_PROVIDER_SITE_OTHER): Payer: Self-pay | Admitting: Ophthalmology

## 2018-04-03 ENCOUNTER — Ambulatory Visit (INDEPENDENT_AMBULATORY_CARE_PROVIDER_SITE_OTHER): Payer: Medicare Other | Admitting: Ophthalmology

## 2018-04-03 DIAGNOSIS — I10 Essential (primary) hypertension: Secondary | ICD-10-CM | POA: Diagnosis not present

## 2018-04-03 DIAGNOSIS — H33322 Round hole, left eye: Secondary | ICD-10-CM

## 2018-04-03 DIAGNOSIS — H4311 Vitreous hemorrhage, right eye: Secondary | ICD-10-CM

## 2018-04-03 DIAGNOSIS — H35033 Hypertensive retinopathy, bilateral: Secondary | ICD-10-CM

## 2018-04-03 DIAGNOSIS — D682 Hereditary deficiency of other clotting factors: Secondary | ICD-10-CM | POA: Diagnosis not present

## 2018-04-03 DIAGNOSIS — Z961 Presence of intraocular lens: Secondary | ICD-10-CM

## 2018-04-03 DIAGNOSIS — H43812 Vitreous degeneration, left eye: Secondary | ICD-10-CM

## 2018-04-03 LAB — C-REACTIVE PROTEIN: CRP: <0.8 mg/dL (ref <1.0)

## 2018-04-03 LAB — SEDIMENTATION RATE: Sed Rate: 24 mm/hr — ABNORMAL HIGH (ref 0–22)

## 2018-04-03 NOTE — Discharge Instructions (Addendum)
Go directly to Dr. Dairl Ponder office at [facility] can wait in the car.  He will come to the front door, open it and motion for you to come in.  He will meet you there at 6:30 AM.  Please return immediately to the emergency department at any time if you have new or worsening symptoms.

## 2018-04-03 NOTE — Progress Notes (Signed)
[facility] Note  04/03/2018     CHIEF COMPLAINT Patient presents for Retina Evaluation   HISTORY OF PRESENT ILLNESS: Nancy Hicks is a 85 y.o. female who presents to the clinic today for:   HPI    Retina Evaluation    In right eye.  This started 2 days ago.  Associated Symptoms Floaters.  Negative for Pain, Photophobia, Scalp Tenderness and Trauma.  I, the attending physician,  performed the HPI with the patient and updated documentation appropriately.          Comments    ED referral for decreased vision OD. Pt reports "gray blur" came over vision while in kitchen Friday evening ~6pm. Presented to Urgent Care in [place] on Saturday who then referred pt to [facility] ED. Underwent stroke work up with CT, MRI/MRA -- all of which was negative. Pt reports +floaters. No photopsias. States that following MRI, she felt like vision improved slightly from prior. 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       Last edited by Bernarda Caffey, MD on 04/03/2018  8:09 AM. (History)      Referring physician: Asencion Noble, MD [address] [place], [place] [address]  HISTORICAL INFORMATION:   Selected notes from the MEDICAL RECORD NUMBER    CURRENT MEDICATIONS: Current Outpatient Medications (Ophthalmic Drugs)  Medication Sig  . Polyethyl Glycol-Propyl Glycol (SYSTANE ULTRA PF) 0.4-0.3 % SOLN Place 2 drops into both eyes 3 (three) times daily.    No current facility-administered medications for this visit.  (Ophthalmic Drugs)   Current Outpatient Medications (Other)  Medication Sig  . ADVAIR DISKUS 100-50 MCG/DOSE AEPB take 1 tablet twice a day (Patient taking differently: Inhale 1 puff into the lungs 2 (two) times daily. )  . allopurinol (ZYLOPRIM) 300 MG tablet Take 300 mg by mouth daily.    . Biotin 5000 MCG CAPS Take 1 capsule by mouth daily.  Marland Kitchen CARTIA XT 300 MG 24 hr capsule Take 300 mg by mouth daily.    . chlorthalidone (HYGROTON) 25 MG tablet Take 25 mg by mouth daily.  . Cholecalciferol (VITAMIN D3) 2000 UNITS TABS Take 1 tablet by mouth daily.    Marland Kitchen esomeprazole (NEXIUM) 40 MG capsule Take 40 mg by mouth daily before breakfast.   . fluticasone (FLONASE) 50 MCG/ACT nasal spray instill 2 sprays into each nostril once daily (Patient taking differently: Place 2 sprays into both nostrils daily as needed for allergies or rhinitis. )  . losartan (COZAAR) 100 MG tablet Take 100 mg by mouth daily.   . methenamine (HIPREX) 1 G tablet Take 500 mg by mouth 2 (two) times daily with a meal.   . methylcellulose (CITRUCEL) oral powder Take 1 packet by mouth 3 (three) times daily. Take 1 tsp by mouth daily   . Multiple Vitamin (MULITIVITAMIN WITH MINERALS) TABS Take 1 tablet by mouth daily.  . Multiple Vitamins-Minerals (OCUVITE-LUTEIN PO) Take 1 tablet by mouth daily.  . sertraline (ZOLOFT) 50 MG tablet Take 50 mg by mouth at bedtime.   . simvastatin (ZOCOR) 10 MG tablet Take 10 mg by mouth at bedtime.    . VENTOLIN HFA 108 (90 BASE) MCG/ACT inhaler inhale 2 puffs by mouth every 6 hours AS NEEDED FOR WHEEZING (Patient taking differently: Inhale 2 puffs into the lungs every 6 (six) hours as needed for wheezing or shortness of breath. )  . warfarin (COUMADIN) 2.5 MG  tablet Take 2.5 mg by mouth every evening. Takes 2.5 mg everyday.   No current facility-administered medications for this visit.  (Other)      REVIEW OF SYSTEMS: ROS    Positive for: Musculoskeletal, Cardiovascular, Eyes, Respiratory, Heme/Lymph   Negative for: Constitutional, Gastrointestinal, Neurological, Skin, Genitourinary, HENT, Endocrine, Psychiatric, Allergic/Imm   Last edited by Bernarda Caffey, MD on 04/03/2018  7:33 AM. (History)       ALLERGIES Allergies  Allergen Reactions  . Metoclopramide Hcl Other (See Comments)    REACTION: causes uncontrolable leg movements  . Nitrofurantoin Nausea And Vomiting and Other (See Comments)     REACTION: dizziness    PAST MEDICAL HISTORY Past Medical History:  Diagnosis Date  . Anemia, unspecified   . Anxiety state, unspecified   . Blood dyscrasia    RV factor def  . Cancer ([place])    skin  . Degeneration of cervical intervertebral disc   . Disorder of bone and cartilage, unspecified   . Diverticulosis of colon (without mention of hemorrhage) 01/13/1999  . Esophageal reflux   . Esophageal stricture   . Factor V deficiency ([place])   . Factor V deficiency ([place])   . Hiatal hernia 04/09/2010   ? Paraesophageal component on chest CT 2006  . HTN (hypertension)   . Irritable bowel syndrome   . Neurogenic bladder   . Osteoarthrosis, unspecified whether generalized or localized, unspecified site   . Peripheral vascular disease (HCC)    pe, dvt hx  . Personal history of thrombophlebitis   . Personal history of venous thrombosis and embolism   . 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 Pure hypercholesterolemia   . Raynaud phenomenon   . Shortness of breath dyspnea   . Unspecified asthma(493.90)   . Unspecified disorder resulting from impaired renal function    cysts   Past Surgical History:  Procedure Laterality Date  . CHOLECYSTECTOMY    . COLONOSCOPY  2000   Dr. Verl Blalock: moderate to severe sigmoid diverticulosis  . COLONOSCOPY  03/2015   Dr. Peggye [facility] at [facility]: TCS for abnormal sigmoid colon on PET. Moderate diverticulosis of the sigmoid colon, sessile 8-9 mm sigmoid: Polyp biopsied (given anticoagulation), two 3 mm ICV polyps removed. All polyps were tubular adenomas.  . ESOPHAGEAL MANOMETRY  10/26/2011   Procedure: ESOPHAGEAL MANOMETRY (EM);  Surgeon: Sable Feil, MD;  Location: [facility];  Service: Endoscopy;  Laterality: N/A;  . ESOPHAGOGASTRODUODENOSCOPY  03/2010   Dr. Verl Blalock: candida esophagitis, prolapsing hh with cameron lesions, multiple polyps  . EYE SURGERY Bilateral    cataracts  . Flexible sigmoidoscopy  05/2015   Dr.  Peggye Form at [facility]: Performed off anticoagulation. 1-1.5 cm sessile sigmoid polyp removed, site tattooed. Pathology revealed tubular adenoma. Colonoscopy in 3-5 years as medically appropriate.  Marland Kitchen PAROTIDECTOMY Left 11/14/2014  . PAROTIDECTOMY Left 11/14/2014   Procedure: LEFT LATERAL PAROTIDECTOMY;  Surgeon: Leta Baptist, MD;  Location: [facility];  Service: ENT;  Laterality: Left;  . 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 TONSILLECTOMY      FAMILY HISTORY Family History  Problem Relation Age of Onset  . Parkinsonism Mother   . COPD Brother   . Asthma Brother   . Lung cancer Brother   . Heart attack Brother   . Diabetes Paternal Aunt     SOCIAL HISTORY Social History   Tobacco Use  . Smoking status: Never Smoker  . Smokeless tobacco: Never Used  Substance Use  Topics  . Alcohol use: No  . Drug use: No         OPHTHALMIC EXAM:  Base Eye Exam    Visual Acuity (Snellen - Linear)      Right Left   Dist cc 20/80 -2 20/20  Slow acuity OD       Tonometry (Tonopen, 6:50 AM)      Right Left   Pressure 7 8       Pupils      Dark Light Shape React APD   Right 3 1.5 Round + None   Left 3 1.5 Round + None       Visual Fields (Counting fingers)      Left Right    Full Full       Extraocular Movement      Right Left    Full, Ortho Full, Ortho       Neuro/Psych    Oriented x3:  Yes   Mood/Affect:  Normal       Dilation    Both eyes:  1.0% Mydriacyl, 2.5% Phenylephrine @ 7:01 AM        Slit Lamp and Fundus Exam    Slit Lamp Exam      Right Left   Lids/Lashes dermato dermato   Conjunctiva/Sclera White and quiet White and quiet   Cornea arcus; 3+ PEE--greatest centrally arcus; 2+ PEE   Anterior Chamber Deep; 3+ RBC Deep and quiet   Iris Round and reactive Round and reactive   Lens PCIOL PCIOL   Vitreous Vitreous syneresis; diffuse RBC syneresis, Posterior vitreous detachment       Fundus Exam      Right Left   Disc hazy view; perfused; no details Normal    C/D Ratio  0.1   Macula hazy view; no details; red reflex flat; blunted foveal reflex; mild RPE mottling   Vessels hazy view; no details attenuated   Periphery hazy view; no details; red reflex attached; pigmented operculated hole at 0130; scattered reticular degeneration          IMAGING AND PROCEDURES  Imaging and Procedures for 04/03/18  B-Scan Ultrasound - OD - Right Eye       Quality was good. Findings included vitreous hemorrhage, vitreous opacities.   Notes **Images stored on drive**  Impression: OD: vitreous opacities consistent with hemorrhage; focal hyperechoic signal at optic disc ?disc drusen; no obvious RT/RD or mass                ASSESSMENT/PLAN:    ICD-10-CM   1. Vitreous hemorrhage, right eye (HCC) H43.11 B-Scan Ultrasound - OD - Right Eye  2. Essential hypertension I10   3. Hypertensive retinopathy of both eyes H35.033   4. Factor V deficiency ([place]) D68.2   5. Retinal hole, left H33.322   6. Posterior vitreous detachment of left eye H43.812   7. Pseudophakia of both eyes Z96.1     1. Vitreous hemorrhage OD - acute onset on Friday evening ~0600 pm -- no pain or photopsias - diffuse VH with spillover into AC - BCVA 20/80 OD - b-scan ultrasound without RT/RD or mass - pt with history of Factor V Leiden deficiency -- on coumadin - also has history of HTN which was recently uncontrolled due to medication changes - differential includes hemorrhagic PVD - VH precautions reviewed -- minimize activities, keep head elevated, avoid ASA/NSAIDs/blood thinners as able - f/u Wednesday 815 for repeat exam  2,3. Hypertensive retinopathy OU - discussed importance of tight  BP control - monitor  4. Factor V Leiden - history of DVT and PE - currently on coumadin, 2.5 mg daily - INR in ED was 1.97  5. Operculated retinal hole OS - pigmented operculated hole without SRF at 0130 -- appears chronic and stable but recommend laser retinopexy to retinal break  once VH a little more stable - The incidence, risk factors, and natural history of retinal tear was discussed with patient.   - Potential treatment options including laser retinopexy and cryotherapy discussed with patient. - monitor for now -- laser retinopexy once VH more stable  6. PVD / vitreous syneresis  Discussed findings and prognosis  No RT or RD on 360 scleral depressed exam  Reviewed s/s of RT/RD  Strict return precautions for any such RT/RD signs/symptoms  7. Pseudophakia OU  - s/p CE/IOL OU by Dr. Dolores Lory  - doing well  - monitor   Ophthalmic Meds Ordered this visit:  No orders of the defined types were placed in this encounter.      Return in about 3 days (around 04/06/2018) for Dilated Exam, OCT.  There are no Patient Instructions on file for this visit.   Explained the diagnoses, plan, and follow up with the patient and they expressed understanding.  Patient expressed understanding of the importance of proper follow up care.   Gardiner Sleeper, M.D., Ph.D. Diseases & Surgery of the Retina and Vitreous [facility] 04/03/18     Abbreviations: M myopia (nearsighted); A astigmatism; H hyperopia (farsighted); P presbyopia; Mrx spectacle prescription;  CTL contact lenses; OD right eye; OS left eye; OU both eyes  XT exotropia; ET esotropia; PEK punctate epithelial keratitis; PEE punctate epithelial erosions; DES dry eye syndrome; MGD meibomian gland dysfunction; ATs artificial tears; PFAT's preservative free artificial tears; [place]or nuclear sclerotic cataract; PSC posterior subcapsular cataract; ERM epi-retinal membrane; PVD posterior vitreous detachment; RD retinal detachment; DM diabetes mellitus; DR diabetic retinopathy; NPDR non-proliferative diabetic retinopathy; PDR proliferative diabetic retinopathy; CSME clinically significant macular edema; DME diabetic macular edema; dbh dot blot hemorrhages; CWS cotton wool spot; POAG primary open angle  glaucoma; C/D cup-to-disc ratio; HVF humphrey visual field; GVF goldmann visual field; OCT optical coherence tomography; IOP intraocular pressure; BRVO Branch retinal vein occlusion; CRVO central retinal vein occlusion; CRAO central retinal artery occlusion; BRAO branch retinal artery occlusion; RT retinal tear; SB scleral buckle; PPV pars plana vitrectomy; VH Vitreous hemorrhage; PRP panretinal laser photocoagulation; IVK intravitreal kenalog; VMT vitreomacular traction; MH Macular hole;  NVD neovascularization of the disc; NVE neovascularization elsewhere; AREDS age related eye disease study; ARMD age related macular degeneration; POAG primary open angle glaucoma; EBMD epithelial/anterior basement membrane dystrophy; ACIOL anterior chamber intraocular lens; IOL intraocular lens; PCIOL posterior chamber intraocular lens; Phaco/IOL phacoemulsification with intraocular lens placement; [place] photorefractive keratectomy; LASIK laser assisted in situ keratomileusis; HTN hypertension; DM diabetes mellitus; COPD chronic obstructive pulmonary disease

## 2018-04-03 NOTE — ED Notes (Signed)
Pt discharged from ED; instructions provided; Pt encouraged to return to ED if symptoms worsen and to f/u with PCP; Pt verbalized understanding of all instructions 

## 2018-04-05 NOTE — Progress Notes (Signed)
[facility] Note  04/06/2018     CHIEF COMPLAINT Patient presents for Retina Follow Up   HISTORY OF PRESENT ILLNESS: Nancy Hicks is a 85 y.o. female who presents to the clinic today for:   HPI    Retina Follow Up    Patient presents with  Other.  In right eye.  This started 3 days ago.  Severity is moderate.  Duration of 3 days.  Since onset it is gradually improving.  I, the attending physician,  performed the HPI with the patient and updated documentation appropriately.          Comments    85 y/o female pt here for 3 day f/u for vit heme OD.  VA OD gradually improving.  No change in [place] OS.  Denies pain, flashes, floaters.  No gtts.       Last edited by Bernarda Caffey, MD on 04/06/2018 11:53 AM. (History)    Pt's daughter states that pt has been having violent coughing spells recently. Reports compliance with keeping head upright and limiting activities and head movements  Referring physician: Asencion Noble, MD [address] [place], [place] [address]  HISTORICAL INFORMATION:   Selected notes from the MEDICAL RECORD NUMBER    CURRENT MEDICATIONS: Current Outpatient Medications (Ophthalmic Drugs)  Medication Sig  . Polyethyl Glycol-Propyl Glycol (SYSTANE ULTRA PF) 0.4-0.3 % SOLN Place 2 drops into both eyes 3 (three) times daily.    No current facility-administered medications for this visit.  (Ophthalmic Drugs)   Current Outpatient Medications (Other)  Medication Sig  . ADVAIR DISKUS 100-50 MCG/DOSE AEPB take 1 tablet twice a day (Patient taking differently: Inhale 1 puff into the lungs 2 (two) times daily. )  . allopurinol (ZYLOPRIM) 300 MG tablet Take 300 mg by mouth daily.    . Biotin 5000 MCG CAPS Take 1 capsule by mouth daily.  Marland Kitchen CARTIA XT 300 MG 24 hr capsule Take 300 mg by mouth daily.   . chlorthalidone (HYGROTON) 25 MG tablet Take 25 mg by mouth daily.  . Cholecalciferol (VITAMIN D3) 2000 UNITS TABS Take 1 tablet by mouth  daily.    Marland Kitchen esomeprazole (NEXIUM) 40 MG capsule Take 40 mg by mouth daily before breakfast.   . fluticasone (FLONASE) 50 MCG/ACT nasal spray instill 2 sprays into each nostril once daily (Patient taking differently: Place 2 sprays into both nostrils daily as needed for allergies or rhinitis. )  . losartan (COZAAR) 100 MG tablet Take 100 mg by mouth daily.   . methenamine (HIPREX) 1 G tablet Take 500 mg by mouth 2 (two) times daily with a meal.   . methylcellulose (CITRUCEL) oral powder Take 1 packet by mouth 3 (three) times daily. Take 1 tsp by mouth daily   . Multiple Vitamin (MULITIVITAMIN WITH MINERALS) TABS Take 1 tablet by mouth daily.  . Multiple Vitamins-Minerals (OCUVITE-LUTEIN PO) Take 1 tablet by mouth daily.  . sertraline (ZOLOFT) 50 MG tablet Take 50 mg by mouth at bedtime.   . simvastatin (ZOCOR) 10 MG tablet Take 10 mg by mouth at bedtime.    . VENTOLIN HFA 108 (90 BASE) MCG/ACT inhaler inhale 2 puffs by mouth every 6 hours AS NEEDED FOR WHEEZING (Patient taking differently: Inhale 2 puffs into the lungs every 6 (six) hours as needed for wheezing or shortness of breath. )  . warfarin (COUMADIN) 2.5 MG tablet Take 2.5 mg by mouth every evening. Takes 2.5 mg everyday.   No current  facility-administered medications for this visit.  (Other)      REVIEW OF SYSTEMS: ROS    Positive for: Gastrointestinal, Musculoskeletal, Cardiovascular, Eyes, Respiratory, Psychiatric   Negative for: Constitutional, Neurological, Skin, Genitourinary, HENT, Endocrine, Allergic/Imm, Heme/Lymph   Last edited by Matthew Folks, COA on 04/06/2018  8:38 AM. (History)       ALLERGIES Allergies  Allergen Reactions  . Metoclopramide Hcl Other (See Comments)    REACTION: causes uncontrolable leg movements  . Nitrofurantoin Nausea And Vomiting and Other (See Comments)    REACTION: dizziness    PAST MEDICAL HISTORY Past Medical History:  Diagnosis Date  . Anemia, unspecified   . Anxiety state,  unspecified   . Blood dyscrasia    RV factor def  . Cancer ([place])    skin  . Degeneration of cervical intervertebral disc   . Disorder of bone and cartilage, unspecified   . Diverticulosis of colon (without mention of hemorrhage) 01/13/1999  . Esophageal reflux   . Esophageal stricture   . Factor V deficiency ([place])   . Factor V deficiency ([place])   . Hiatal hernia 04/09/2010   ? Paraesophageal component on chest CT 2006  . HTN (hypertension)   . Irritable bowel syndrome   . Neurogenic bladder   . Osteoarthrosis, unspecified whether generalized or localized, unspecified site   . Peripheral vascular disease (HCC)    pe, dvt hx  . Personal history of thrombophlebitis   . Personal history of venous thrombosis and embolism   . 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 Pure hypercholesterolemia   . Raynaud phenomenon   . Shortness of breath dyspnea   . Unspecified asthma(493.90)   . Unspecified disorder resulting from impaired renal function    cysts   Past Surgical History:  Procedure Laterality Date  . CHOLECYSTECTOMY    . COLONOSCOPY  2000   Dr. Verl Blalock: moderate to severe sigmoid diverticulosis  . COLONOSCOPY  03/2015   Dr. Peggye Form at [facility]: TCS for abnormal sigmoid colon on PET. Moderate diverticulosis of the sigmoid colon, sessile 8-9 mm sigmoid: Polyp biopsied (given anticoagulation), two 3 mm ICV polyps removed. All polyps were tubular adenomas.  . ESOPHAGEAL MANOMETRY  10/26/2011   Procedure: ESOPHAGEAL MANOMETRY (EM);  Surgeon: Sable Feil, MD;  Location: [facility];  Service: Endoscopy;  Laterality: N/A;  . ESOPHAGOGASTRODUODENOSCOPY  03/2010   Dr. Verl Blalock: candida esophagitis, prolapsing hh with cameron lesions, multiple polyps  . EYE SURGERY Bilateral    cataracts  . Flexible sigmoidoscopy  05/2015   Dr. Peggye Form at [facility]: Performed off anticoagulation. 1-1.5 cm sessile sigmoid polyp removed, site tattooed. Pathology revealed tubular  adenoma. Colonoscopy in 3-5 years as medically appropriate.  Marland Kitchen PAROTIDECTOMY Left 11/14/2014  . PAROTIDECTOMY Left 11/14/2014   Procedure: LEFT LATERAL PAROTIDECTOMY;  Surgeon: Leta Baptist, MD;  Location: [facility];  Service: ENT;  Laterality: Left;  . 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 TONSILLECTOMY      FAMILY HISTORY Family History  Problem Relation Age of Onset  . Parkinsonism Mother   . COPD Brother   . Asthma Brother   . Lung cancer Brother   . Heart attack Brother   . Diabetes Paternal Aunt     SOCIAL HISTORY Social History   Tobacco Use  . Smoking status: Never Smoker  . Smokeless tobacco: Never Used  Substance Use Topics  . Alcohol use: No  . Drug use: No  OPHTHALMIC EXAM:  Base Eye Exam    Visual Acuity (Snellen - Linear)      Right Left   Dist cc 20/70 -2 20/20   Dist ph cc NI    Correction:  Glasses       Tonometry (Tonopen, 8:39 AM)      Right Left   Pressure 13 10       Pupils      Dark Light Shape React APD   Right 3 1.5 Round Brisk None   Left 3 1.5 Round Brisk None       Visual Fields (Counting fingers)      Left Right    Full Full       Extraocular Movement      Right Left    Full, Ortho Full, Ortho       Neuro/Psych    Oriented x3:  Yes   Mood/Affect:  Normal       Dilation    Both eyes:  1.0% Mydriacyl, 2.5% Phenylephrine @ 8:40 AM        Slit Lamp and Fundus Exam    Slit Lamp Exam      Right Left   Lids/Lashes dermato dermato   Conjunctiva/Sclera White and quiet White and quiet   Cornea arcus; 2-3+ PEE greatest inf arcus; 2+ PEE   Anterior Chamber Deep; 3+ RBC Deep and quiet   Iris round, dilated; +iridodonesis Round and reactive   Lens PCIOL; no pseudophakodonesis PCIOL   Vitreous Vitreous syneresis; diffuse RBC -- slightly improved syneresis, Posterior vitreous detachment       Fundus Exam      Right Left   Disc hazy view; perfused; no details Normal   C/D Ratio  0.1   Macula hazy view slightly improved;  no details; grossly attached flat; blunted foveal reflex; mild RPE mottling   Vessels hazy view slightly improved; no details attenuated   Periphery hazy view slightly improved; grossly attached; no details attached; pigmented operculated hole at 0130; scattered reticular degeneration; stable from prior        Refraction    Wearing Rx      Sphere Cylinder Axis Add   Right +0.50 +1.00 017 +2.50   Left -0.25 +0.50 170 +2.50   Age:  20yrs   Type:  PAL       Manifest Refraction      Sphere Cylinder Axis Dist VA   Right +0.50 +1.25 010 20/70   Left -0.25 +0.50 170 20/20          IMAGING AND PROCEDURES  Imaging and Procedures for 04/03/18  OCT, Retina - OU - Both Eyes       Right Eye Quality was borderline. Central Foveal Thickness: 342. Progression has no prior data. Findings include normal foveal contour, no SRF, epiretinal membrane, no IRF (Mild cystic changes).   Left Eye Quality was good. Central Foveal Thickness: 289. Progression has no prior data. Findings include normal foveal contour, no IRF, no SRF (Tr ERM).   Notes *Images captured and stored on drive  Diagnosis / Impression:  NFP; no IRF/SRF OU Mild ERM OU Vitreous opacities OD  Clinical management:  See below  Abbreviations: NFP - Normal foveal profile. CME - cystoid macular edema. PED - pigment epithelial detachment. IRF - intraretinal fluid. SRF - subretinal fluid. EZ - ellipsoid zone. ERM - epiretinal membrane. ORA - outer retinal atrophy. ORT - outer retinal tubulation. SRHM - subretinal hyper-reflective material  ASSESSMENT/PLAN:    ICD-10-CM   1. Vitreous hemorrhage, right eye (HCC) H43.11   2. Essential hypertension I10   3. Hypertensive retinopathy of both eyes H35.033   4. Factor V deficiency ([place]) D68.2   5. Retinal hole, left H33.322   6. Posterior vitreous detachment of left eye H43.812   7. Pseudophakia of both eyes Z96.1   8. Retinal edema H35.81 OCT, Retina - OU  - Both Eyes    1. Vitreous hemorrhage OD - acute onset on Friday evening ~0600 pm -- no pain or photopsias - diffuse VH with spillover into AC - BCVA at initial presentation 20/80 OD - b-scan ultrasound without RT/RD or mass - pt with history of Factor V Leiden deficiency -- on coumadin - also has history of HTN which was recently uncontrolled due to medication changes - also reports recent violent coughing spells - differential includes hemorrhagic PVD - today, VH mildly improved and BCVA improved to 20/70 - +iridodonesis noted today OD -- ?UGH syndrome picture contributing? - VH precautions reviewed -- minimize activities, keep head elevated, avoid ASA/NSAIDs/blood thinners as able - f/u Tuesday 9am for repeat exam  2,3. Hypertensive retinopathy OU - discussed importance of tight BP control - monitor  4. Factor V Leiden - history of DVT and PE - currently on coumadin, 2.5 mg daily - INR in ED was 1.97  5. Operculated retinal hole OS - pigmented operculated hole without SRF at 0130 -- appears chronic and stable but recommend laser retinopexy to retinal break once VH a little more stable - The incidence, risk factors, and natural history of retinal tear was discussed with patient.   - Potential treatment options including laser retinopexy and cryotherapy discussed with patient. - monitor for now -- laser retinopexy once VH more stable  6. PVD / vitreous syneresis  Discussed findings and prognosis  No RT or RD on 360 scleral depressed exam  Reviewed s/s of RT/RD  Strict return precautions for any such RT/RD signs/symptoms  7. Pseudophakia OU  - s/p CE/IOL OU by Dr. Dolores Lory  - doing well  - monitor  8. No retinal edema on exam or OCT   Ophthalmic Meds Ordered this visit:  No orders of the defined types were placed in this encounter.      Return in about 6 days (around 04/12/2018) for f/u VH OS - Dilated Exam, OCT.  There are no Patient Instructions on file for this  visit.   Explained the diagnoses, plan, and follow up with the patient and they expressed understanding.  Patient expressed understanding of the importance of proper follow up care.   This document serves as a record of services personally performed by Gardiner Sleeper, MD, PhD. It was created on their behalf by Ernest Mallick, OA, an ophthalmic assistant. The creation of this record is the provider's dictation and/or activities during the visit.    Electronically signed by: Ernest Mallick, OA  01.07.2020 12:04 PM    Gardiner Sleeper, M.D., Ph.D. Diseases & Surgery of the Retina and Vitreous [facility]  I have reviewed the above documentation for accuracy and completeness, and I agree with the above. Gardiner Sleeper, M.D., Ph.D. 04/06/18 12:04 PM    Abbreviations: M myopia (nearsighted); A astigmatism; H hyperopia (farsighted); P presbyopia; Mrx spectacle prescription;  CTL contact lenses; OD right eye; OS left eye; OU both eyes  XT exotropia; ET esotropia; PEK punctate epithelial keratitis; PEE punctate epithelial erosions; DES dry eye  syndrome; MGD meibomian gland dysfunction; ATs artificial tears; PFAT's preservative free artificial tears; Roy nuclear sclerotic cataract; PSC posterior subcapsular cataract; ERM epi-retinal membrane; PVD posterior vitreous detachment; RD retinal detachment; DM diabetes mellitus; DR diabetic retinopathy; NPDR non-proliferative diabetic retinopathy; PDR proliferative diabetic retinopathy; CSME clinically significant macular edema; DME diabetic macular edema; dbh dot blot hemorrhages; CWS cotton wool spot; POAG primary open angle glaucoma; C/D cup-to-disc ratio; HVF humphrey visual field; GVF goldmann visual field; OCT optical coherence tomography; IOP intraocular pressure; BRVO Branch retinal vein occlusion; CRVO central retinal vein occlusion; CRAO central retinal artery occlusion; BRAO branch retinal artery occlusion; RT retinal tear; SB scleral  buckle; PPV pars plana vitrectomy; VH Vitreous hemorrhage; PRP panretinal laser photocoagulation; IVK intravitreal kenalog; VMT vitreomacular traction; MH Macular hole;  NVD neovascularization of the disc; NVE neovascularization elsewhere; AREDS age related eye disease study; ARMD age related macular degeneration; POAG primary open angle glaucoma; EBMD epithelial/anterior basement membrane dystrophy; ACIOL anterior chamber intraocular lens; IOL intraocular lens; PCIOL posterior chamber intraocular lens; Phaco/IOL phacoemulsification with intraocular lens placement; [place] photorefractive keratectomy; LASIK laser assisted in situ keratomileusis; HTN hypertension; DM diabetes mellitus; COPD chronic obstructive pulmonary disease

## 2018-04-06 ENCOUNTER — Ambulatory Visit (INDEPENDENT_AMBULATORY_CARE_PROVIDER_SITE_OTHER): Payer: Medicare Other | Admitting: Ophthalmology

## 2018-04-06 ENCOUNTER — Encounter (INDEPENDENT_AMBULATORY_CARE_PROVIDER_SITE_OTHER): Payer: Self-pay | Admitting: Ophthalmology

## 2018-04-06 DIAGNOSIS — H33322 Round hole, left eye: Secondary | ICD-10-CM

## 2018-04-06 DIAGNOSIS — D682 Hereditary deficiency of other clotting factors: Secondary | ICD-10-CM | POA: Diagnosis not present

## 2018-04-06 DIAGNOSIS — H4311 Vitreous hemorrhage, right eye: Secondary | ICD-10-CM

## 2018-04-06 DIAGNOSIS — I10 Essential (primary) hypertension: Secondary | ICD-10-CM | POA: Diagnosis not present

## 2018-04-06 DIAGNOSIS — H43812 Vitreous degeneration, left eye: Secondary | ICD-10-CM

## 2018-04-06 DIAGNOSIS — H35033 Hypertensive retinopathy, bilateral: Secondary | ICD-10-CM | POA: Diagnosis not present

## 2018-04-06 DIAGNOSIS — H3581 Retinal edema: Secondary | ICD-10-CM

## 2018-04-06 DIAGNOSIS — Z961 Presence of intraocular lens: Secondary | ICD-10-CM

## 2018-04-09 NOTE — Progress Notes (Signed)
[facility] Note  04/12/2018     CHIEF COMPLAINT Patient presents for Retina Follow Up   HISTORY OF PRESENT ILLNESS: Nancy Hicks is a 85 y.o. female who presents to the clinic today for:   HPI    Retina Follow Up    Patient presents with  Other.  In right eye.  This started 4 days ago.  Severity is moderate.  Duration of 3 weeks.  Since onset it is gradually worsening.  I, the attending physician,  performed the HPI with the patient and updated documentation appropriately.          Comments    Patient here for 3 week retina follow up for VH OD. Patient states vision doing awful. Can't see anything. Has eye pain. Hurts to open eye. The eye doesn't open wide. Started 4 days ago.        Last edited by Bernarda Caffey, MD on 04/16/2018  3:32 PM. (History)    pt states that since Friday she has not been able to see out of her right eye and it's been very painful, she states it hurts to open it and it hurts to touch it, pt added AT's and has been using them about 6 times a day, pts daughter states she has been taking it easy  Referring physician: Asencion Noble, MD [address] [place], [place] [address]  HISTORICAL INFORMATION:   Selected notes from the [facility]: Current Outpatient Medications (Ophthalmic Drugs)  Medication Sig  . brimonidine (ALPHAGAN) 0.2 % ophthalmic solution Place 1 drop into the right eye 2 (two) times daily.  . dorzolamide (TRUSOPT) 2 % ophthalmic solution Place 1 drop into the right eye 2 (two) times daily.  Vladimir Faster Glycol-Propyl Glycol (SYSTANE ULTRA PF) 0.4-0.3 % SOLN Place 2 drops into both eyes 3 (three) times daily.   . prednisoLONE acetate (PRED FORTE) 1 % ophthalmic suspension Place 1 drop into the right eye every hour while awake.   No current facility-administered medications for this visit.  (Ophthalmic Drugs)   Current Outpatient Medications (Other)  Medication Sig   . ADVAIR DISKUS 100-50 MCG/DOSE AEPB take 1 tablet twice a day (Patient taking differently: Inhale 1 puff into the lungs 2 (two) times daily. )  . allopurinol (ZYLOPRIM) 300 MG tablet Take 300 mg by mouth daily.    . Biotin 5000 MCG CAPS Take 1 capsule by mouth daily.  Marland Kitchen CARTIA XT 300 MG 24 hr capsule Take 300 mg by mouth daily.   . chlorthalidone (HYGROTON) 25 MG tablet Take 25 mg by mouth daily.  . Cholecalciferol (VITAMIN D3) 2000 UNITS TABS Take 1 tablet by mouth daily.    Marland Kitchen esomeprazole (NEXIUM) 40 MG capsule Take 40 mg by mouth daily before breakfast.   . fluticasone (FLONASE) 50 MCG/ACT nasal spray instill 2 sprays into each nostril once daily (Patient taking differently: Place 2 sprays into both nostrils daily as needed for allergies or rhinitis. )  . losartan (COZAAR) 100 MG tablet Take 100 mg by mouth daily.   . methenamine (HIPREX) 1 G tablet Take 500 mg by mouth 2 (two) times daily with a meal.   . methylcellulose (CITRUCEL) oral powder Take 1 packet by mouth 3 (three) times daily. Take 1 tsp by mouth daily   . Multiple Vitamin (MULITIVITAMIN WITH MINERALS) TABS Take 1 tablet by mouth daily.  . Multiple Vitamins-Minerals (OCUVITE-LUTEIN PO) Take 1 tablet by mouth  daily.  . sertraline (ZOLOFT) 50 MG tablet Take 50 mg by mouth at bedtime.   . simvastatin (ZOCOR) 10 MG tablet Take 10 mg by mouth at bedtime.    . VENTOLIN HFA 108 (90 BASE) MCG/ACT inhaler inhale 2 puffs by mouth every 6 hours AS NEEDED FOR WHEEZING (Patient taking differently: Inhale 2 puffs into the lungs every 6 (six) hours as needed for wheezing or shortness of breath. )  . warfarin (COUMADIN) 2.5 MG tablet Take 2.5 mg by mouth every evening. Takes 2.5 mg everyday.   No current facility-administered medications for this visit.  (Other)      REVIEW OF SYSTEMS: ROS    Positive for: Gastrointestinal, Musculoskeletal, Cardiovascular, Eyes, Respiratory, Psychiatric   Negative for: Constitutional, Neurological, Skin,  Genitourinary, HENT, Endocrine, Allergic/Imm, Heme/Lymph   Last edited by Bernarda Caffey, MD on 04/12/2018 10:25 AM. (History)       ALLERGIES Allergies  Allergen Reactions  . Metoclopramide Hcl Other (See Comments)    REACTION: causes uncontrolable leg movements  . Nitrofurantoin Nausea And Vomiting and Other (See Comments)    REACTION: dizziness    PAST MEDICAL HISTORY Past Medical History:  Diagnosis Date  . Anemia, unspecified   . Anxiety state, unspecified   . Blood dyscrasia    RV factor def  . Cancer ([place])    skin  . Degeneration of cervical intervertebral disc   . Disorder of bone and cartilage, unspecified   . Diverticulosis of colon (without mention of hemorrhage) 01/13/1999  . Esophageal reflux   . Esophageal stricture   . Factor V deficiency ([place])   . Factor V deficiency ([place])   . Hiatal hernia 04/09/2010   ? Paraesophageal component on chest CT 2006  . HTN (hypertension)   . Irritable bowel syndrome   . Neurogenic bladder   . Osteoarthrosis, unspecified whether generalized or localized, unspecified site   . Peripheral vascular disease (HCC)    pe, dvt hx  . Personal history of thrombophlebitis   . Personal history of venous thrombosis and embolism   . 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 Pure hypercholesterolemia   . Raynaud phenomenon   . Shortness of breath dyspnea   . Unspecified asthma(493.90)   . Unspecified disorder resulting from impaired renal function    cysts   Past Surgical History:  Procedure Laterality Date  . CHOLECYSTECTOMY    . COLONOSCOPY  2000   Dr. Verl Blalock: moderate to severe sigmoid diverticulosis  . COLONOSCOPY  03/2015   Dr. Peggye Form at [facility]: TCS for abnormal sigmoid colon on PET. Moderate diverticulosis of the sigmoid colon, sessile 8-9 mm sigmoid: Polyp biopsied (given anticoagulation), two 3 mm ICV polyps removed. All polyps were tubular adenomas.  . ESOPHAGEAL MANOMETRY  10/26/2011   Procedure:  ESOPHAGEAL MANOMETRY (EM);  Surgeon: Sable Feil, MD;  Location: [facility];  Service: Endoscopy;  Laterality: N/A;  . ESOPHAGOGASTRODUODENOSCOPY  03/2010   Dr. Verl Blalock: candida esophagitis, prolapsing hh with cameron lesions, multiple polyps  . EYE SURGERY Bilateral    cataracts  . Flexible sigmoidoscopy  05/2015   Dr. Peggye Form at [facility]: Performed off anticoagulation. 1-1.5 cm sessile sigmoid polyp removed, site tattooed. Pathology revealed tubular adenoma. Colonoscopy in 3-5 years as medically appropriate.  Marland Kitchen PAROTIDECTOMY Left 11/14/2014  . PAROTIDECTOMY Left 11/14/2014   Procedure: LEFT LATERAL PAROTIDECTOMY;  Surgeon: Leta Baptist, MD;  Location: [facility];  Service: ENT;  Laterality: Left;  . PARS PLANA VITRECTOMY Right 04/15/2018  Procedure: PARS PLANA VITRECTOMY WITH 25 GAUGE;  Surgeon: Bernarda Caffey, MD;  Location: [facility];  Service: Ophthalmology;  Laterality: Right;  . PHOTOCOAGULATION WITH LASER Right 04/15/2018   Procedure: PHOTOCOAGULATION WITH LASER;  Surgeon: Bernarda Caffey, MD;  Location: [facility];  Service: Ophthalmology;  Laterality: Right;  . 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 TONSILLECTOMY      FAMILY HISTORY Family History  Problem Relation Age of Onset  . Parkinsonism Mother   . COPD Brother   . Asthma Brother   . Lung cancer Brother   . Heart attack Brother   . Diabetes Paternal Aunt     SOCIAL HISTORY Social History   Tobacco Use  . Smoking status: Never Smoker  . Smokeless tobacco: Never Used  Substance Use Topics  . Alcohol use: No  . Drug use: No         OPHTHALMIC EXAM:  Base Eye Exam    Visual Acuity (Snellen - Linear)      Right Left   Dist cc LP 20/25 -1   Dist ph cc NI        Tonometry (Tonopen, 9:32 AM)      Right Left   Pressure 17 13       Pupils      Dark Light Shape React APD   Right 3 3 Round Minimal None   Left 3 1.5 Round Brisk None       Visual Fields (Counting fingers)      Left Right     Full    Restrictions Total inferior temporal, superior nasal, inferior nasal deficiencies; Partial inner superior temporal deficiency        Extraocular Movement      Right Left    Full, Ortho Full, Ortho       Neuro/Psych    Oriented x3:  Yes   Mood/Affect:  Normal       Dilation    Both eyes:  1.0% Mydriacyl, 2.5% Phenylephrine @ 9:32 AM        Slit Lamp and Fundus Exam    Slit Lamp Exam      Right Left   Lids/Lashes dermato dermato   Conjunctiva/Sclera White and quiet White and quiet   Cornea arcus; 3+ PEE greatest inf, 1+ Descemet's folds arcus; 2+ PEE   Anterior Chamber Deep; 4+ RBC, Layered hyphema 58mm Deep and quiet   Iris round, dilated; +iridodonesis Round and reactive   Lens PCIOL; no pseudophakodonesis PCIOL   Vitreous Vitreous syneresis; diffuse RBC -- slightly improved syneresis, Posterior vitreous detachment       Fundus Exam      Right Left   Disc No view Normal   C/D Ratio  0.1   Macula No view flat; blunted foveal reflex; mild RPE mottling   Vessels No view attenuated   Periphery No view attached; pigmented operculated hole at 0130; scattered reticular degeneration; stable from prior        Refraction    Wearing Rx      Sphere Cylinder Axis Add   Right +0.50 +1.00 017 +2.50   Left -0.25 +0.50 170 +2.50   Type:  PAL          IMAGING AND PROCEDURES  Imaging and Procedures for 04/03/18  OCT, Retina - OU - Both Eyes       Right Eye Quality was poor. Central Foveal Thickness: (No image obtained). Progression has no prior data. Findings include (No image obtained).  Left Eye Quality was good. Central Foveal Thickness: 284. Progression has no prior data. Findings include normal foveal contour, no IRF, no SRF (Tr ERM).   Notes *Images captured and stored on drive  Diagnosis / Impression:  No image obtained OD today NFP; no IRF/SRF OS Mild ERM OS   Clinical management:  See below  Abbreviations: NFP - Normal foveal profile. CME - cystoid  macular edema. PED - pigment epithelial detachment. IRF - intraretinal fluid. SRF - subretinal fluid. EZ - ellipsoid zone. ERM - epiretinal membrane. ORA - outer retinal atrophy. ORT - outer retinal tubulation. SRHM - subretinal hyper-reflective material                 ASSESSMENT/PLAN:    ICD-10-CM   1. Vitreous hemorrhage, right eye (HCC) H43.11   2. Essential hypertension I10   3. Hypertensive retinopathy of both eyes H35.033   4. Factor V deficiency ([place]) D68.2   5. Retinal hole, left H33.322   6. Posterior vitreous detachment of left eye H43.812   7. Pseudophakia of both eyes Z96.1   8. Retinal edema H35.81 OCT, Retina - OU - Both Eyes    1,2. Vitreous hemorrhage OD -- now with hyphema - acute onset on Friday evening ~0600 pm -- no pain or photopsias - diffuse VH with spillover into AC - BCVA at initial presentation 20/80 OD - b-scan ultrasound without RT/RD or mass - pt with history of Factor V Leiden deficiency -- on coumadin - also has history of HTN which was recently uncontrolled due to medication changes - differential includes hemorrhagic PVD - today, VH significantly worse with increased spill over into AC (+hyphema), BCVA OD: LP - +iridodonesis noted today OD -- ?UGH syndrome picture contributing? - VH precautions reviewed -- minimize activities, keep head elevated, avoid ASA/NSAIDs/blood thinners as able - start Pred Forte Q1H OD  - Atropine BID OD  - dorzolamide BID OD  - brimonidine BID OD - f/u Friday for recheck -- may need PPV if VH and hyphema remain dense  3,4. Hypertensive retinopathy OU - discussed importance of tight BP control - monitor  5. Factor V Leiden - history of DVT and PE - currently on coumadin, 2.5 mg daily - INR in ED was 1.97  6. Operculated retinal hole OS - pigmented operculated hole without SRF at 0130 -- appears chronic and stable but recommend laser retinopexy to retinal break once VH a little more stable - The incidence,  risk factors, and natural history of retinal tear was discussed with patient.   - Potential treatment options including laser retinopexy and cryotherapy discussed with patient. - monitor for now -- laser retinopexy once VH more stable  7. PVD / vitreous syneresis  Discussed findings and prognosis  No RT or RD on 360 scleral depressed exam  Reviewed s/s of RT/RD  Strict return precautions for any such RT/RD signs/symptoms  8. Pseudophakia OU  - s/p CE/IOL OU by Dr. Dolores Lory  - doing well  - monior  9. No retinal edema on exam or OCT   Ophthalmic Meds Ordered this visit:  Meds ordered this encounter  Medications  . prednisoLONE acetate (PRED FORTE) 1 % ophthalmic suspension    Sig: Place 1 drop into the right eye every hour while awake.    Dispense:  15 mL    Refill:  1  . brimonidine (ALPHAGAN) 0.2 % ophthalmic solution    Sig: Place 1 drop into the right eye 2 (two) times daily.  Dispense:  10 mL    Refill:  3  . dorzolamide (TRUSOPT) 2 % ophthalmic solution    Sig: Place 1 drop into the right eye 2 (two) times daily.    Dispense:  10 mL    Refill:  3       Return in about 3 days (around 04/15/2018) for f/u VH, DFE, OCT.  There are no Patient Instructions on file for this visit.   Explained the diagnoses, plan, and follow up with the patient and they expressed understanding.  Patient expressed understanding of the importance of proper follow up care.   This document serves as a record of services personally performed by Gardiner Sleeper, MD, PhD. It was created on their behalf by Ernest Mallick, OA, an ophthalmic assistant. The creation of this record is the provider's dictation and/or activities during the visit.    Electronically signed by: Ernest Mallick, OA  01.11.2020 3:33 PM    Gardiner Sleeper, M.D., Ph.D. Diseases & Surgery of the Retina and Vitreous [facility]  I have reviewed the above documentation for accuracy and completeness, and I  agree with the above. Gardiner Sleeper, M.D., Ph.D. 04/16/18 3:38 PM    Abbreviations: M myopia (nearsighted); A astigmatism; H hyperopia (farsighted); P presbyopia; Mrx spectacle prescription;  CTL contact lenses; OD right eye; OS left eye; OU both eyes  XT exotropia; ET esotropia; PEK punctate epithelial keratitis; PEE punctate epithelial erosions; DES dry eye syndrome; MGD meibomian gland dysfunction; ATs artificial tears; PFAT's preservative free artificial tears; [place] nuclear sclerotic cataract; PSC posterior subcapsular cataract; ERM epi-retinal membrane; PVD posterior vitreous detachment; RD retinal detachment; DM diabetes mellitus; DR diabetic retinopathy; NPDR non-proliferative diabetic retinopathy; PDR proliferative diabetic retinopathy; CSME clinically significant macular edema; DME diabetic macular edema; dbh dot blot hemorrhages; CWS cotton wool spot; POAG primary open angle glaucoma; C/D cup-to-disc ratio; HVF humphrey visual field; GVF goldmann visual field; OCT optical coherence tomography; IOP intraocular pressure; BRVO Branch retinal vein occlusion; CRVO central retinal vein occlusion; CRAO central retinal artery occlusion; BRAO branch retinal artery occlusion; RT retinal tear; SB scleral buckle; PPV pars plana vitrectomy; VH Vitreous hemorrhage; PRP panretinal laser photocoagulation; IVK intravitreal kenalog; VMT vitreomacular traction; MH Macular hole;  NVD neovascularization of the disc; NVE neovascularization elsewhere; AREDS age related eye disease study; ARMD age related macular degeneration; POAG primary open angle glaucoma; EBMD epithelial/anterior basement membrane dystrophy; ACIOL anterior chamber intraocular lens; IOL intraocular lens; PCIOL posterior chamber intraocular lens; Phaco/IOL phacoemulsification with intraocular lens placement; [place] photorefractive keratectomy; LASIK laser assisted in situ keratomileusis; HTN hypertension; DM diabetes mellitus; COPD chronic obstructive  pulmonary disease

## 2018-04-12 ENCOUNTER — Ambulatory Visit (INDEPENDENT_AMBULATORY_CARE_PROVIDER_SITE_OTHER): Payer: Medicare Other | Admitting: Ophthalmology

## 2018-04-12 DIAGNOSIS — H4311 Vitreous hemorrhage, right eye: Secondary | ICD-10-CM

## 2018-04-12 DIAGNOSIS — D682 Hereditary deficiency of other clotting factors: Secondary | ICD-10-CM

## 2018-04-12 DIAGNOSIS — Z961 Presence of intraocular lens: Secondary | ICD-10-CM

## 2018-04-12 DIAGNOSIS — H3581 Retinal edema: Secondary | ICD-10-CM | POA: Diagnosis not present

## 2018-04-12 DIAGNOSIS — I10 Essential (primary) hypertension: Secondary | ICD-10-CM

## 2018-04-12 DIAGNOSIS — H35033 Hypertensive retinopathy, bilateral: Secondary | ICD-10-CM | POA: Diagnosis not present

## 2018-04-12 DIAGNOSIS — H43812 Vitreous degeneration, left eye: Secondary | ICD-10-CM

## 2018-04-12 DIAGNOSIS — H2101 Hyphema, right eye: Secondary | ICD-10-CM | POA: Diagnosis not present

## 2018-04-12 DIAGNOSIS — H33322 Round hole, left eye: Secondary | ICD-10-CM

## 2018-04-12 MED ORDER — BRIMONIDINE TARTRATE 0.2 % OP SOLN: 1.0000 [drp] | Freq: Two times a day (BID) | OPHTHALMIC | 3 refills | Status: AC

## 2018-04-12 MED ORDER — DORZOLAMIDE HCL 2 % OP SOLN: 1.0000 [drp] | Freq: Two times a day (BID) | OPHTHALMIC | 3 refills | Status: AC

## 2018-04-12 MED ORDER — PREDNISOLONE ACETATE 1 % OP SUSP: 1.0000 [drp] | OPHTHALMIC | 1 refills | Status: DC

## 2018-04-15 ENCOUNTER — Encounter (HOSPITAL_COMMUNITY)
Admission: RE | Disposition: A | Discharge status: Home / Self Care | Payer: Self-pay | Source: Ambulatory Visit | Attending: Ophthalmology

## 2018-04-15 ENCOUNTER — Encounter (HOSPITAL_COMMUNITY): Payer: Self-pay | Admitting: Certified Registered"

## 2018-04-15 ENCOUNTER — Ambulatory Visit (HOSPITAL_COMMUNITY)
Admission: RE | Admit: 2018-04-15 | Discharge: 2018-04-15 | Disposition: A | Discharge status: Home / Self Care | Payer: Medicare Other | Source: Ambulatory Visit | Attending: Ophthalmology | Admitting: Ophthalmology

## 2018-04-15 ENCOUNTER — Encounter (INDEPENDENT_AMBULATORY_CARE_PROVIDER_SITE_OTHER): Payer: Self-pay | Admitting: Ophthalmology

## 2018-04-15 ENCOUNTER — Ambulatory Visit (HOSPITAL_COMMUNITY): Payer: Medicare Other | Admitting: Anesthesiology

## 2018-04-15 ENCOUNTER — Other Ambulatory Visit: Payer: Self-pay

## 2018-04-15 ENCOUNTER — Ambulatory Visit (INDEPENDENT_AMBULATORY_CARE_PROVIDER_SITE_OTHER): Payer: Medicare Other | Admitting: Ophthalmology

## 2018-04-15 DIAGNOSIS — Z820 Family history of epilepsy and other diseases of the nervous system: Secondary | ICD-10-CM | POA: Diagnosis not present

## 2018-04-15 DIAGNOSIS — H35033 Hypertensive retinopathy, bilateral: Secondary | ICD-10-CM | POA: Diagnosis not present

## 2018-04-15 DIAGNOSIS — H33021 Retinal detachment with multiple breaks, right eye: Secondary | ICD-10-CM | POA: Diagnosis not present

## 2018-04-15 DIAGNOSIS — Z9889 Other specified postprocedural states: Secondary | ICD-10-CM | POA: Diagnosis not present

## 2018-04-15 DIAGNOSIS — H33322 Round hole, left eye: Secondary | ICD-10-CM

## 2018-04-15 DIAGNOSIS — Z9049 Acquired absence of other specified parts of digestive tract: Secondary | ICD-10-CM | POA: Insufficient documentation

## 2018-04-15 DIAGNOSIS — E1151 Type 2 diabetes mellitus with diabetic peripheral angiopathy without gangrene: Secondary | ICD-10-CM | POA: Diagnosis not present

## 2018-04-15 DIAGNOSIS — F419 Anxiety disorder, unspecified: Secondary | ICD-10-CM | POA: Insufficient documentation

## 2018-04-15 DIAGNOSIS — Z801 Family history of malignant neoplasm of trachea, bronchus and lung: Secondary | ICD-10-CM | POA: Insufficient documentation

## 2018-04-15 DIAGNOSIS — Z85828 Personal history of other malignant neoplasm of skin: Secondary | ICD-10-CM | POA: Diagnosis not present

## 2018-04-15 DIAGNOSIS — K589 Irritable bowel syndrome without diarrhea: Secondary | ICD-10-CM | POA: Diagnosis not present

## 2018-04-15 DIAGNOSIS — I10 Essential (primary) hypertension: Secondary | ICD-10-CM | POA: Diagnosis not present

## 2018-04-15 DIAGNOSIS — M5030 Other cervical disc degeneration, unspecified cervical region: Secondary | ICD-10-CM | POA: Insufficient documentation

## 2018-04-15 DIAGNOSIS — Z86718 Personal history of other venous thrombosis and embolism: Secondary | ICD-10-CM | POA: Diagnosis not present

## 2018-04-15 DIAGNOSIS — M1990 Unspecified osteoarthritis, unspecified site: Secondary | ICD-10-CM | POA: Insufficient documentation

## 2018-04-15 DIAGNOSIS — Z825 Family history of asthma and other chronic lower respiratory diseases: Secondary | ICD-10-CM | POA: Diagnosis not present

## 2018-04-15 DIAGNOSIS — I739 Peripheral vascular disease, unspecified: Secondary | ICD-10-CM | POA: Diagnosis not present

## 2018-04-15 DIAGNOSIS — N319 Neuromuscular dysfunction of bladder, unspecified: Secondary | ICD-10-CM | POA: Diagnosis not present

## 2018-04-15 DIAGNOSIS — E7800 Pure hypercholesterolemia, unspecified: Secondary | ICD-10-CM | POA: Insufficient documentation

## 2018-04-15 DIAGNOSIS — H4311 Vitreous hemorrhage, right eye: Secondary | ICD-10-CM | POA: Diagnosis not present

## 2018-04-15 DIAGNOSIS — D6851 Activated protein C resistance: Secondary | ICD-10-CM | POA: Diagnosis not present

## 2018-04-15 DIAGNOSIS — Z961 Presence of intraocular lens: Secondary | ICD-10-CM

## 2018-04-15 DIAGNOSIS — Z7901 Long term (current) use of anticoagulants: Secondary | ICD-10-CM | POA: Insufficient documentation

## 2018-04-15 DIAGNOSIS — K219 Gastro-esophageal reflux disease without esophagitis: Secondary | ICD-10-CM | POA: Diagnosis not present

## 2018-04-15 DIAGNOSIS — H2101 Hyphema, right eye: Secondary | ICD-10-CM | POA: Diagnosis not present

## 2018-04-15 DIAGNOSIS — K449 Diaphragmatic hernia without obstruction or gangrene: Secondary | ICD-10-CM | POA: Insufficient documentation

## 2018-04-15 DIAGNOSIS — Z833 Family history of diabetes mellitus: Secondary | ICD-10-CM | POA: Insufficient documentation

## 2018-04-15 DIAGNOSIS — Z888 Allergy status to other drugs, medicaments and biological substances status: Secondary | ICD-10-CM | POA: Insufficient documentation

## 2018-04-15 DIAGNOSIS — D682 Hereditary deficiency of other clotting factors: Secondary | ICD-10-CM

## 2018-04-15 DIAGNOSIS — H43812 Vitreous degeneration, left eye: Secondary | ICD-10-CM

## 2018-04-15 DIAGNOSIS — Z881 Allergy status to other antibiotic agents status: Secondary | ICD-10-CM | POA: Insufficient documentation

## 2018-04-15 DIAGNOSIS — I7300 Raynaud's syndrome without gangrene: Secondary | ICD-10-CM | POA: Insufficient documentation

## 2018-04-15 DIAGNOSIS — H3581 Retinal edema: Secondary | ICD-10-CM | POA: Diagnosis not present

## 2018-04-15 DIAGNOSIS — J449 Chronic obstructive pulmonary disease, unspecified: Secondary | ICD-10-CM | POA: Insufficient documentation

## 2018-04-15 DIAGNOSIS — Z8249 Family history of ischemic heart disease and other diseases of the circulatory system: Secondary | ICD-10-CM | POA: Insufficient documentation

## 2018-04-15 HISTORY — PX: PHOTOCOAGULATION WITH LASER: SHX6027

## 2018-04-15 HISTORY — PX: PARS PLANA VITRECTOMY: SHX2166

## 2018-04-15 LAB — PROTIME-INR
INR: 1.65
Prothrombin Time: 19.4 seconds — ABNORMAL HIGH (ref 11.4–15.2)

## 2018-04-15 SURGERY — PARS PLANA VITRECTOMY WITH 25 GAUGE
Anesthesia: General | Laterality: Right

## 2018-04-15 SURGERY — PARS PLANA VITRECTOMY WITH 25 GAUGE
Anesthesia: General | Site: Eye | Laterality: Right

## 2018-04-15 MED ORDER — NA CHONDROIT SULF-NA HYALURON 40-30 MG/ML IO SOLN
INTRAOCULAR | Status: AC
  Filled 2018-04-15: qty 1

## 2018-04-15 MED ORDER — TROPICAMIDE 1 % OP SOLN
1.0000 [drp] | OPHTHALMIC | Status: AC | PRN
  Administered 2018-04-15 (×3): 1 [drp] via OPHTHALMIC
  Filled 2018-04-15 (×2): qty 15

## 2018-04-15 MED ORDER — MEPERIDINE HCL 50 MG/ML IJ SOLN: 6.2500 mg | INTRAMUSCULAR | Status: DC | PRN

## 2018-04-15 MED ORDER — ROCURONIUM BROMIDE 10 MG/ML (PF) SYRINGE
PREFILLED_SYRINGE | INTRAVENOUS | Status: DC | PRN
  Administered 2018-04-15: 20 mg via INTRAVENOUS
  Administered 2018-04-15: 50 mg via INTRAVENOUS
  Administered 2018-04-15: 10 mg via INTRAVENOUS

## 2018-04-15 MED ORDER — PREDNISOLONE ACETATE 1 % OP SUSP
OPHTHALMIC | Status: DC | PRN
  Administered 2018-04-15: 1 [drp] via OPHTHALMIC

## 2018-04-15 MED ORDER — PROMETHAZINE HCL 25 MG/ML IJ SOLN: 6.2500 mg | INTRAMUSCULAR | Status: DC | PRN

## 2018-04-15 MED ORDER — BRIMONIDINE TARTRATE 0.2 % OP SOLN
OPHTHALMIC | Status: AC
  Filled 2018-04-15: qty 5

## 2018-04-15 MED ORDER — GATIFLOXACIN 0.5 % OP SOLN OPTIME - NO CHARGE
OPHTHALMIC | Status: DC | PRN
  Administered 2018-04-15: 1 [drp] via OPHTHALMIC

## 2018-04-15 MED ORDER — EPINEPHRINE PF 1 MG/ML IJ SOLN
INTRAOCULAR | Status: DC | PRN
  Administered 2018-04-15: 16:00:00
  Administered 2018-04-15: 500 mL

## 2018-04-15 MED ORDER — NA CHONDROIT SULF-NA HYALURON 40-30 MG/ML IO SOLN
INTRAOCULAR | Status: DC | PRN
  Administered 2018-04-15: 0.5 mL via INTRAOCULAR

## 2018-04-15 MED ORDER — INDOCYANINE GREEN 25 MG IV SOLR
INTRAVENOUS | Status: AC
  Filled 2018-04-15: qty 25

## 2018-04-15 MED ORDER — CARBACHOL 0.01 % IO SOLN
INTRAOCULAR | Status: AC
  Filled 2018-04-15: qty 1.5

## 2018-04-15 MED ORDER — CEFTAZIDIME 1 G IJ SOLR
INTRAMUSCULAR | Status: AC
  Filled 2018-04-15: qty 1

## 2018-04-15 MED ORDER — BACITRACIN-POLYMYXIN B 500-10000 UNIT/GM OP OINT
TOPICAL_OINTMENT | OPHTHALMIC | Status: DC | PRN
  Administered 2018-04-15: 1 via OPHTHALMIC

## 2018-04-15 MED ORDER — DEXAMETHASONE SODIUM PHOSPHATE 10 MG/ML IJ SOLN
INTRAMUSCULAR | Status: DC | PRN
  Administered 2018-04-15: 10 mg via INTRAVENOUS

## 2018-04-15 MED ORDER — POLYMYXIN B SULFATE 500000 UNITS IJ SOLR
INTRAMUSCULAR | Status: AC
  Filled 2018-04-15: qty 500000

## 2018-04-15 MED ORDER — BUPIVACAINE HCL (PF) 0.75 % IJ SOLN
INTRAMUSCULAR | Status: AC
  Filled 2018-04-15: qty 10

## 2018-04-15 MED ORDER — BACITRACIN-POLYMYXIN B 500-10000 UNIT/GM OP OINT
TOPICAL_OINTMENT | OPHTHALMIC | Status: AC
  Filled 2018-04-15: qty 3.5

## 2018-04-15 MED ORDER — DORZOLAMIDE HCL-TIMOLOL MAL 2-0.5 % OP SOLN
OPHTHALMIC | Status: DC | PRN
  Administered 2018-04-15: 1 [drp] via OPHTHALMIC

## 2018-04-15 MED ORDER — DOUBLE ANTIBIOTIC 500-10000 UNIT/GM EX OINT
TOPICAL_OINTMENT | CUTANEOUS | Status: AC
  Filled 2018-04-15: qty 1

## 2018-04-15 MED ORDER — BSS PLUS IO SOLN
INTRAOCULAR | Status: AC
  Filled 2018-04-15: qty 500

## 2018-04-15 MED ORDER — STERILE WATER FOR INJECTION IJ SOLN
INTRAMUSCULAR | Status: AC
  Filled 2018-04-15: qty 10

## 2018-04-15 MED ORDER — DEXTROSE 5 % IV SOLN
Freq: Once | INTRAVENOUS | Status: DC
  Filled 2018-04-15: qty 1000

## 2018-04-15 MED ORDER — LACTATED RINGERS IV SOLN
INTRAVENOUS | Status: DC | PRN
  Administered 2018-04-15: 16:00:00 via INTRAVENOUS

## 2018-04-15 MED ORDER — BSS IO SOLN
INTRAOCULAR | Status: DC | PRN
  Administered 2018-04-15: 15 mL via INTRAOCULAR

## 2018-04-15 MED ORDER — SODIUM CHLORIDE (PF) 0.9 % IJ SOLN
INTRAMUSCULAR | Status: AC
  Filled 2018-04-15: qty 10

## 2018-04-15 MED ORDER — TRIAMCINOLONE ACETONIDE 40 MG/ML IJ SUSP
INTRAMUSCULAR | Status: DC | PRN
  Administered 2018-04-15: 5 mL

## 2018-04-15 MED ORDER — PROPOFOL 10 MG/ML IV BOLUS
INTRAVENOUS | Status: DC | PRN
  Administered 2018-04-15: 120 mg via INTRAVENOUS

## 2018-04-15 MED ORDER — LIDOCAINE HCL (PF) 4 % IJ SOLN
INTRAMUSCULAR | Status: AC
  Filled 2018-04-15: qty 5

## 2018-04-15 MED ORDER — LIDOCAINE 2% (20 MG/ML) 5 ML SYRINGE
INTRAMUSCULAR | Status: DC | PRN
  Administered 2018-04-15: 80 mg via INTRAVENOUS

## 2018-04-15 MED ORDER — FENTANYL CITRATE (PF) 250 MCG/5ML IJ SOLN
INTRAMUSCULAR | Status: AC
  Filled 2018-04-15: qty 5

## 2018-04-15 MED ORDER — GATIFLOXACIN 0.5 % OP SOLN
OPHTHALMIC | Status: AC
  Filled 2018-04-15: qty 2.5

## 2018-04-15 MED ORDER — TRIAMCINOLONE ACETONIDE 40 MG/ML IJ SUSP
INTRAMUSCULAR | Status: AC
  Filled 2018-04-15: qty 5

## 2018-04-15 MED ORDER — PREDNISOLONE ACETATE 1 % OP SUSP
OPHTHALMIC | Status: AC
  Filled 2018-04-15: qty 5

## 2018-04-15 MED ORDER — FENTANYL CITRATE (PF) 100 MCG/2ML IJ SOLN: 25.0000 ug | INTRAMUSCULAR | Status: DC | PRN

## 2018-04-15 MED ORDER — PROPARACAINE HCL 0.5 % OP SOLN
1.0000 [drp] | OPHTHALMIC | Status: AC | PRN
  Administered 2018-04-15 (×3): 1 [drp] via OPHTHALMIC
  Filled 2018-04-15 (×2): qty 15

## 2018-04-15 MED ORDER — SODIUM CHLORIDE 0.9 % IV SOLN
INTRAVENOUS | Status: DC | PRN
  Administered 2018-04-15: 15 ug/min via INTRAVENOUS

## 2018-04-15 MED ORDER — ESMOLOL HCL 100 MG/10ML IV SOLN
INTRAVENOUS | Status: AC
  Filled 2018-04-15: qty 10

## 2018-04-15 MED ORDER — ONDANSETRON HCL 4 MG/2ML IJ SOLN
INTRAMUSCULAR | Status: DC | PRN
  Administered 2018-04-15: 4 mg via INTRAVENOUS

## 2018-04-15 MED ORDER — BRIMONIDINE TARTRATE 0.2 % OP SOLN
OPHTHALMIC | Status: DC | PRN
  Administered 2018-04-15: 1 [drp] via OPHTHALMIC

## 2018-04-15 MED ORDER — PHENYLEPHRINE 40 MCG/ML (10ML) SYRINGE FOR IV PUSH (FOR BLOOD PRESSURE SUPPORT)
PREFILLED_SYRINGE | INTRAVENOUS | Status: DC | PRN
  Administered 2018-04-15 (×2): 80 ug via INTRAVENOUS

## 2018-04-15 MED ORDER — ACETAZOLAMIDE SODIUM 500 MG IJ SOLR
INTRAMUSCULAR | Status: AC
  Filled 2018-04-15: qty 500

## 2018-04-15 MED ORDER — LIDOCAINE HCL (PF) 2 % IJ SOLN
INTRAMUSCULAR | Status: AC
  Filled 2018-04-15: qty 10

## 2018-04-15 MED ORDER — ATROPINE SULFATE 1 % OP SOLN
OPHTHALMIC | Status: DC | PRN
  Administered 2018-04-15: 1 [drp] via OPHTHALMIC

## 2018-04-15 MED ORDER — BSS IO SOLN
INTRAOCULAR | Status: AC
  Filled 2018-04-15: qty 15

## 2018-04-15 MED ORDER — STERILE WATER FOR INJECTION IJ SOLN
INTRAMUSCULAR | Status: DC | PRN
  Administered 2018-04-15: 20 mL

## 2018-04-15 MED ORDER — SUGAMMADEX SODIUM 200 MG/2ML IV SOLN
INTRAVENOUS | Status: DC | PRN
  Administered 2018-04-15: 200 mg via INTRAVENOUS

## 2018-04-15 MED ORDER — PROPOFOL 10 MG/ML IV BOLUS
INTRAVENOUS | Status: AC
  Filled 2018-04-15: qty 20

## 2018-04-15 MED ORDER — FENTANYL CITRATE (PF) 250 MCG/5ML IJ SOLN
INTRAMUSCULAR | Status: DC | PRN
  Administered 2018-04-15: 50 ug via INTRAVENOUS
  Administered 2018-04-15: 25 ug via INTRAVENOUS
  Administered 2018-04-15 (×2): 50 ug via INTRAVENOUS
  Administered 2018-04-15: 25 ug via INTRAVENOUS

## 2018-04-15 MED ORDER — ESMOLOL HCL 100 MG/10ML IV SOLN
INTRAVENOUS | Status: DC | PRN
  Administered 2018-04-15: 30 mg via INTRAVENOUS

## 2018-04-15 MED ORDER — ROCURONIUM BROMIDE 50 MG/5ML IV SOSY
PREFILLED_SYRINGE | INTRAVENOUS | Status: AC
  Filled 2018-04-15: qty 15

## 2018-04-15 MED ORDER — DEXAMETHASONE SODIUM PHOSPHATE 10 MG/ML IJ SOLN
INTRAMUSCULAR | Status: AC
  Filled 2018-04-15: qty 1

## 2018-04-15 MED ORDER — ATROPINE SULFATE 1 % OP SOLN
OPHTHALMIC | Status: AC
  Filled 2018-04-15: qty 5

## 2018-04-15 MED ORDER — TOBRAMYCIN-DEXAMETHASONE 0.3-0.1 % OP OINT
TOPICAL_OINTMENT | OPHTHALMIC | Status: AC
  Filled 2018-04-15: qty 3.5

## 2018-04-15 MED ORDER — DORZOLAMIDE HCL-TIMOLOL MAL 2-0.5 % OP SOLN
OPHTHALMIC | Status: AC
  Filled 2018-04-15: qty 10

## 2018-04-15 MED ORDER — EPINEPHRINE PF 1 MG/ML IJ SOLN
INTRAMUSCULAR | Status: AC
  Filled 2018-04-15: qty 1

## 2018-04-15 MED ORDER — SODIUM CHLORIDE 0.9 % IV SOLN
INTRAVENOUS | Status: DC | PRN
  Administered 2018-04-15: 16:00:00 via INTRAVENOUS

## 2018-04-15 MED ORDER — ATROPINE SULFATE 1 % OP SOLN
1.0000 [drp] | OPHTHALMIC | Status: AC | PRN
  Administered 2018-04-15 (×3): 1 [drp] via OPHTHALMIC
  Filled 2018-04-15 (×2): qty 2

## 2018-04-15 MED ORDER — PHENYLEPHRINE HCL 10 % OP SOLN
1.0000 [drp] | OPHTHALMIC | Status: AC | PRN
  Administered 2018-04-15 (×3): 1 [drp] via OPHTHALMIC
  Filled 2018-04-15 (×2): qty 5

## 2018-04-15 SURGICAL SUPPLY — 73 items
APL SWBSTK 6 STRL LF DISP (MISCELLANEOUS) ×4
APPLICATOR COTTON TIP 6 STRL (MISCELLANEOUS) ×4 IMPLANT
APPLICATOR COTTON TIP 6IN STRL (MISCELLANEOUS) ×12
BALL CTTN LRG ABS STRL LF (GAUZE/BANDAGES/DRESSINGS) ×3
BLADE EYE CATARACT 19 1.4 BEAV (BLADE) IMPLANT
BNDG EYE OVAL (GAUZE/BANDAGES/DRESSINGS) ×3 IMPLANT
CABLE BIPOLOR RESECTION CORD (MISCELLANEOUS) ×3 IMPLANT
CANNULA ANT CHAM MAIN (OPHTHALMIC RELATED) IMPLANT
CANNULA FLEX TIP 25G (CANNULA) ×3 IMPLANT
CANNULA TROCAR 23 GA VLV (OPHTHALMIC) IMPLANT
CANNULA TROCAR 23G VLV (OPHTHALMIC) IMPLANT
CANNULA VLV SOFT TIP 25G (OPHTHALMIC) IMPLANT
CANNULA VLV SOFT TIP 25GA (OPHTHALMIC) IMPLANT
CLOSURE STERI-STRIP 1/2X4 (GAUZE/BANDAGES/DRESSINGS) ×1
CLSR STERI-STRIP ANTIMIC 1/2X4 (GAUZE/BANDAGES/DRESSINGS) ×2 IMPLANT
COTTONBALL LRG STERILE PKG (GAUZE/BANDAGES/DRESSINGS) ×9 IMPLANT
COVER WAND RF STERILE (DRAPES) ×3 IMPLANT
DRAPE MICROSCOPE LEICA 46X105 (MISCELLANEOUS) ×3 IMPLANT
DRAPE OPHTHALMIC 77X100 STRL (CUSTOM PROCEDURE TRAY) ×3 IMPLANT
ERASER HMR WETFIELD 23G BP (MISCELLANEOUS) IMPLANT
FILTER BLUE MILLIPORE (MISCELLANEOUS) ×2 IMPLANT
FILTER STRAW FLUID ASPIR (MISCELLANEOUS) IMPLANT
FORCEPS GRIESHABER ILM 25G A (INSTRUMENTS) IMPLANT
GAS AUTO FILL CONSTEL (OPHTHALMIC)
GAS AUTO FILL CONSTELLATION (OPHTHALMIC) IMPLANT
GLOVE BIO SURGEON STRL SZ7.5 (GLOVE) ×6 IMPLANT
GLOVE BIOGEL M 7.0 STRL (GLOVE) ×3 IMPLANT
GOWN STRL REUS W/ TWL LRG LVL3 (GOWN DISPOSABLE) ×2 IMPLANT
GOWN STRL REUS W/ TWL XL LVL3 (GOWN DISPOSABLE) ×1 IMPLANT
GOWN STRL REUS W/TWL LRG LVL3 (GOWN DISPOSABLE) ×6
GOWN STRL REUS W/TWL XL LVL3 (GOWN DISPOSABLE) ×3
KIT BASIN OR (CUSTOM PROCEDURE TRAY) ×3 IMPLANT
KIT PERFLUORON PROCEDURE 5ML (MISCELLANEOUS) IMPLANT
LENS MACULAR ASPHERIC CONSTEL (OPHTHALMIC) IMPLANT
LENS VITRECTOMY FLAT OCLR DISP (MISCELLANEOUS) IMPLANT
LOOP FINESSE 25 GA (MISCELLANEOUS) IMPLANT
MICROPICK 25G (MISCELLANEOUS)
NDL 18GX1X1/2 (RX/OR ONLY) (NEEDLE) ×1 IMPLANT
NDL 25GX 5/8IN NON SAFETY (NEEDLE) ×3 IMPLANT
NDL FILTER BLUNT 18X1 1/2 (NEEDLE) IMPLANT
NDL HYPO 30X.5 LL (NEEDLE) ×2 IMPLANT
NDL PRECISIONGLIDE 27X1.5 (NEEDLE) IMPLANT
NEEDLE 18GX1X1/2 (RX/OR ONLY) (NEEDLE) ×9 IMPLANT
NEEDLE 25GX 5/8IN NON SAFETY (NEEDLE) ×9 IMPLANT
NEEDLE FILTER BLUNT 18X 1/2SAF (NEEDLE) ×2
NEEDLE FILTER BLUNT 18X1 1/2 (NEEDLE) ×1 IMPLANT
NEEDLE HYPO 30X.5 LL (NEEDLE) ×6 IMPLANT
NEEDLE PRECISIONGLIDE 27X1.5 (NEEDLE) IMPLANT
NS IRRIG 1000ML POUR BTL (IV SOLUTION) ×3 IMPLANT
PACK VITRECTOMY CUSTOM (CUSTOM PROCEDURE TRAY) ×3 IMPLANT
PAD ARMBOARD 7.5X6 YLW CONV (MISCELLANEOUS) ×6 IMPLANT
PAK PIK VITRECTOMY CVS 25GA (OPHTHALMIC) ×3 IMPLANT
PENCIL BIPOLAR 25GA STR DISP (OPHTHALMIC RELATED) ×3 IMPLANT
PICK MICROPICK 25G (MISCELLANEOUS) IMPLANT
PROBE DIATHERMY DSP 27GA (MISCELLANEOUS) IMPLANT
PROBE ENDO DIATHERMY 25G (MISCELLANEOUS) IMPLANT
PROBE LASER ILLUM FLEX CVD 25G (OPHTHALMIC) IMPLANT
REPL STRA BRUSH NDL (NEEDLE) IMPLANT
REPL STRA BRUSH NEEDLE (NEEDLE) IMPLANT
RESERVOIR BACK FLUSH (MISCELLANEOUS) IMPLANT
RETRACTOR IRIS FLEX 25G GRIESH (INSTRUMENTS) IMPLANT
SET INJECTOR OIL FLUID CONSTEL (OPHTHALMIC) IMPLANT
SPONGE SURGIFOAM ABS GEL 12-7 (HEMOSTASIS) IMPLANT
STOPCOCK 4 WAY LG BORE MALE ST (IV SETS) IMPLANT
SUT VICRYL 7 0 TG140 8 (SUTURE) ×3 IMPLANT
SYR 10ML LL (SYRINGE) ×5 IMPLANT
SYR 20CC LL (SYRINGE) ×3 IMPLANT
SYR 5ML LL (SYRINGE) ×3 IMPLANT
SYR BULB 3OZ (MISCELLANEOUS) ×3 IMPLANT
SYR TB 1ML LUER SLIP (SYRINGE) ×8 IMPLANT
TOWEL NATURAL 6PK STERILE (DISPOSABLE) ×3 IMPLANT
TUBING HIGH PRESS EXTEN 6IN (TUBING) ×3 IMPLANT
WATER STERILE IRR 1000ML POUR (IV SOLUTION) ×3 IMPLANT

## 2018-04-15 NOTE — Anesthesia Procedure Notes (Signed)
Procedure Name: Intubation Date/Time: 04/15/2018 4:13 PM Performed by: Jearld Pies, CRNA Pre-anesthesia Checklist: Patient identified, Emergency Drugs available, Suction available and Patient being monitored Patient Re-evaluated:Patient Re-evaluated prior to induction Oxygen Delivery Method: Circle System Utilized Preoxygenation: Pre-oxygenation with 100% oxygen Induction Type: IV induction Ventilation: Mask ventilation without difficulty Laryngoscope Size: Glidescope and 3 Grade View: Grade I Tube type: Oral Tube size: 7.0 mm Number of attempts: 1 Airway Equipment and Method: Stylet and Oral airway Placement Confirmation: ETT inserted through vocal cords under direct vision,  positive ETCO2 and breath sounds checked- equal and bilateral Secured at: 21 cm Tube secured with: Tape Dental Injury: Teeth and Oropharynx as per pre-operative assessment  Comments: Glidescope utilized d/t prominent incisors, high arched palate, and history of cervical disc herniation. Cervical spine maintained within midline during intubation.

## 2018-04-15 NOTE — Progress Notes (Signed)
[facility] Note  04/15/2018     CHIEF COMPLAINT Patient presents for Retina Follow Up   HISTORY OF PRESENT ILLNESS: Nancy Hicks is a 85 y.o. female who presents to the clinic today for:   HPI    Retina Follow Up    In right eye.  This started 2 weeks ago.  Severity is mild.  Since onset it is stable.  I, the attending physician,  performed the HPI with the patient and updated documentation appropriately.          Comments    F/U VH OD , Patient states floaters have decreased, but she can only see light. Denies flashes and ocular pain. Pt is using Atropine  BID OD, PF Qhr OD, Brim BID OD, Dor Bid OD, Refresh 6xQd OD.       Last edited by Bernarda Caffey, MD on 04/15/2018 11:04 AM. (History)    Reports compliance with keeping head upright and limiting activities and head movements.  Referring physician: Asencion Noble, MD [address] [place], [place] [address]  HISTORICAL INFORMATION:   Selected notes from the MEDICAL RECORD NUMBER    CURRENT MEDICATIONS: Current Outpatient Medications (Ophthalmic Drugs)  Medication Sig  . brimonidine (ALPHAGAN) 0.2 % ophthalmic solution Place 1 drop into the right eye 2 (two) times daily.  . dorzolamide (TRUSOPT) 2 % ophthalmic solution Place 1 drop into the right eye 2 (two) times daily.  Vladimir Faster Glycol-Propyl Glycol (SYSTANE ULTRA PF) 0.4-0.3 % SOLN Place 2 drops into both eyes 3 (three) times daily.   . prednisoLONE acetate (PRED FORTE) 1 % ophthalmic suspension Place 1 drop into the right eye every hour while awake.   No current facility-administered medications for this visit.  (Ophthalmic Drugs)   Current Outpatient Medications (Other)  Medication Sig  . ADVAIR DISKUS 100-50 MCG/DOSE AEPB take 1 tablet twice a day (Patient taking differently: Inhale 1 puff into the lungs 2 (two) times daily. )  . allopurinol (ZYLOPRIM) 300 MG tablet Take 300 mg by mouth daily.    . Biotin 5000 MCG CAPS  Take 1 capsule by mouth daily.  Marland Kitchen CARTIA XT 300 MG 24 hr capsule Take 300 mg by mouth daily.   . chlorthalidone (HYGROTON) 25 MG tablet Take 25 mg by mouth daily.  . Cholecalciferol (VITAMIN D3) 2000 UNITS TABS Take 1 tablet by mouth daily.    Marland Kitchen esomeprazole (NEXIUM) 40 MG capsule Take 40 mg by mouth daily before breakfast.   . fluticasone (FLONASE) 50 MCG/ACT nasal spray instill 2 sprays into each nostril once daily (Patient taking differently: Place 2 sprays into both nostrils daily as needed for allergies or rhinitis. )  . losartan (COZAAR) 100 MG tablet Take 100 mg by mouth daily.   . methenamine (HIPREX) 1 G tablet Take 500 mg by mouth 2 (two) times daily with a meal.   . methylcellulose (CITRUCEL) oral powder Take 1 packet by mouth 3 (three) times daily. Take 1 tsp by mouth daily   . Multiple Vitamin (MULITIVITAMIN WITH MINERALS) TABS Take 1 tablet by mouth daily.  . Multiple Vitamins-Minerals (OCUVITE-LUTEIN PO) Take 1 tablet by mouth daily.  . sertraline (ZOLOFT) 50 MG tablet Take 50 mg by mouth at bedtime.   . simvastatin (ZOCOR) 10 MG tablet Take 10 mg by mouth at bedtime.    . VENTOLIN HFA 108 (90 BASE) MCG/ACT inhaler inhale 2 puffs by mouth every 6 hours AS NEEDED FOR WHEEZING (Patient taking  differently: Inhale 2 puffs into the lungs every 6 (six) hours as needed for wheezing or shortness of breath. )  . warfarin (COUMADIN) 2.5 MG tablet Take 2.5 mg by mouth every evening. Takes 2.5 mg everyday.   No current facility-administered medications for this visit.  (Other)      REVIEW OF SYSTEMS: ROS    Positive for: Eyes   Negative for: Constitutional, Gastrointestinal, Neurological, Skin, Genitourinary, Musculoskeletal, HENT, Endocrine, Cardiovascular, Respiratory, Psychiatric, Allergic/Imm, Heme/Lymph   Last edited by Zenovia Jordan, LPN on 07/06/8117  1:47 AM. (History)       ALLERGIES Allergies  Allergen Reactions  . Metoclopramide Hcl Other (See Comments)    REACTION:  causes uncontrolable leg movements  . Nitrofurantoin Nausea And Vomiting and Other (See Comments)    REACTION: dizziness    PAST MEDICAL HISTORY Past Medical History:  Diagnosis Date  . Anemia, unspecified   . Anxiety state, unspecified   . Blood dyscrasia    RV factor def  . Cancer ([place])    skin  . Degeneration of cervical intervertebral disc   . Disorder of bone and cartilage, unspecified   . Diverticulosis of colon (without mention of hemorrhage) 01/13/1999  . Esophageal reflux   . Esophageal stricture   . Factor V deficiency ([place])   . Factor V deficiency ([place])   . Hiatal hernia 04/09/2010   ? Paraesophageal component on chest CT 2006  . HTN (hypertension)   . Irritable bowel syndrome   . Neurogenic bladder   . Osteoarthrosis, unspecified whether generalized or localized, unspecified site   . Peripheral vascular disease (HCC)    pe, dvt hx  . Personal history of thrombophlebitis   . Personal history of venous thrombosis and embolism   . 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 Pure hypercholesterolemia   . Raynaud phenomenon   . Shortness of breath dyspnea   . Unspecified asthma(493.90)   . Unspecified disorder resulting from impaired renal function    cysts   Past Surgical History:  Procedure Laterality Date  . CHOLECYSTECTOMY    . COLONOSCOPY  2000   Dr. Verl Blalock: moderate to severe sigmoid diverticulosis  . COLONOSCOPY  03/2015   Dr. Peggye Form at [facility]: TCS for abnormal sigmoid colon on PET. Moderate diverticulosis of the sigmoid colon, sessile 8-9 mm sigmoid: Polyp biopsied (given anticoagulation), two 3 mm ICV polyps removed. All polyps were tubular adenomas.  . ESOPHAGEAL MANOMETRY  10/26/2011   Procedure: ESOPHAGEAL MANOMETRY (EM);  Surgeon: Sable Feil, MD;  Location: [facility];  Service: Endoscopy;  Laterality: N/A;  . ESOPHAGOGASTRODUODENOSCOPY  03/2010   Dr. Verl Blalock: candida esophagitis, prolapsing hh with cameron lesions,  multiple polyps  . EYE SURGERY Bilateral    cataracts  . Flexible sigmoidoscopy  05/2015   Dr. Peggye Form at [facility]: Performed off anticoagulation. 1-1.5 cm sessile sigmoid polyp removed, site tattooed. Pathology revealed tubular adenoma. Colonoscopy in 3-5 years as medically appropriate.  Marland Kitchen PAROTIDECTOMY Left 11/14/2014  . PAROTIDECTOMY Left 11/14/2014   Procedure: LEFT LATERAL PAROTIDECTOMY;  Surgeon: Leta Baptist, MD;  Location: [facility];  Service: ENT;  Laterality: Left;  . 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 TONSILLECTOMY      FAMILY HISTORY Family History  Problem Relation Age of Onset  . Parkinsonism Mother   . COPD Brother   . Asthma Brother   . Lung cancer Brother   . Heart attack Brother   . Diabetes Paternal Aunt  SOCIAL HISTORY Social History   Tobacco Use  . Smoking status: Never Smoker  . Smokeless tobacco: Never Used  Substance Use Topics  . Alcohol use: No  . Drug use: No         OPHTHALMIC EXAM:  Base Eye Exam    Visual Acuity (Snellen - Linear)      Right Left   Dist cc HM 20/25 +1       Tonometry (Tonopen, 8:28 AM)      Right Left   Pressure 12 10       Pupils      Dark Light Shape React APD   Right 3 2 Round Slow None   Left 3 2 Round Brisk None       Visual Fields (Counting fingers)      Left Right    Full    Restrictions  Total superior temporal, inferior temporal, superior nasal, inferior nasal deficiencies       Extraocular Movement      Right Left    Full, Ortho Full, Ortho       Neuro/Psych    Oriented x3:  Yes   Mood/Affect:  Normal       Dilation    Both eyes:  1.0% Mydriacyl, 2.5% Phenylephrine @ 8:19 AM        Slit Lamp and Fundus Exam    Slit Lamp Exam      Right Left   Lids/Lashes dermato dermato   Conjunctiva/Sclera White and quiet White and quiet   Cornea arcus; 3+ PEE greatest inf, 1+ Descemet's folds arcus; 2+ PEE   Anterior Chamber Deep; 4+ RBC, Layered hyphema improved Deep and quiet   Iris round,  dilated; +iridodonesis Round and reactive   Lens PCIOL; +pseudophakodonesis PCIOL   Vitreous Vitreous syneresis; diffuse RBC -- slightly improved but still very dense syneresis, Posterior vitreous detachment       Fundus Exam      Right Left   Disc No view Normal   C/D Ratio  0.1   Macula No view flat; blunted foveal reflex; mild RPE mottling   Vessels No view attenuated   Periphery No view attached; pigmented operculated hole at 0130; scattered reticular degeneration; stable from prior          IMAGING AND PROCEDURES  Imaging and Procedures for 04/03/18  OCT, Retina - OU - Both Eyes       Right Eye Quality was poor. Central Foveal Thickness: (No image obtained). Progression has no prior data. Findings include (Single line scans -- uninterpretable).   Left Eye Quality was good. Central Foveal Thickness: 284. Progression has been stable. Findings include normal foveal contour, no IRF, no SRF (Tr ERM).   Notes *Images captured and stored on drive  Diagnosis / Impression:  OD: Single line scans -- uninterpretable OS: NFP; no IRF/SRF; tr ERM    Clinical management:  See below  Abbreviations: NFP - Normal foveal profile. CME - cystoid macular edema. PED - pigment epithelial detachment. IRF - intraretinal fluid. SRF - subretinal fluid. EZ - ellipsoid zone. ERM - epiretinal membrane. ORA - outer retinal atrophy. ORT - outer retinal tubulation. SRHM - subretinal hyper-reflective material        B-Scan Ultrasound - OD - Right Eye       Quality was good. Findings included vitreous hemorrhage, vitreous opacities.   Notes **Images stored on drive**  Impression: OD: vitreous opacities consistent with hemorrhage; focal hyperechoic signal at optic disc ?disc drusen; no  obvious RT/RD or mass; +phacodonesis and +iridodonesis -- ?UGH                ASSESSMENT/PLAN:    ICD-10-CM   1. Vitreous hemorrhage, right eye (HCC) H43.11 B-Scan Ultrasound - OD - Right Eye  2.  Essential hypertension I10   3. Hypertensive retinopathy of both eyes H35.033   4. Factor V deficiency ([place]) D68.2   5. Retinal hole, left H33.322   6. Posterior vitreous detachment of left eye H43.812   7. Pseudophakia of both eyes Z96.1   8. Retinal edema H35.81 OCT, Retina - OU - Both Eyes    1. Vitreous hemorrhage OD - acute onset on Friday evening ~0600 pm -- no pain or photopsias - diffuse VH with spillover into AC - BCVA at initial presentation 20/80 OD - b-scan ultrasound without RT/RD or mass - pt with history of Factor V Leiden deficiency -- on coumadin - also has history of HTN which was recently uncontrolled due to medication changes - also reports recent violent coughing spells - differential includes hemorrhagic PVD - today, VH mildly improved but still very dense and BCVA HM OD from LP - +iridodonesis/pseudophakodonesis OD -- ?UGH syndrome picture contributing? - discussed findings and prognosis - recommend emergent PPV w/ possible endolaser and possible AC washout OD under general anesthesia - RBA of procedure discussed, questions answered - informed consent obtained and signed - case scheduled for 330 pm today -- OR 08 - remain NPO until surgery - f/u tomorrow for POV1  2,3. Hypertensive retinopathy OU - discussed importance of tight BP control - monitor  4. Factor V Leiden - history of DVT and PE - currently on coumadin, 2.5 mg daily - INR in ED was 1.97  5. Operculated retinal hole OS - pigmented operculated hole without SRF at 0130 -- appears chronic and stable but recommend laser retinopexy to retinal break once VH a little more stable - The incidence, risk factors, and natural history of retinal tear was discussed with patient.   - Potential treatment options including laser retinopexy and cryotherapy discussed with patient. - monitor for now -- laser retinopexy once VH more stable  6. PVD / vitreous syneresis  Discussed findings and prognosis  No RT  or RD on 360 scleral depressed exam  Reviewed s/s of RT/RD  Strict return precautions for any such RT/RD signs/symptoms  7. Pseudophakia OU  - s/p CE/IOL OU by Dr. Dolores Lory  - doing well  - monitor  8. No retinal edema on exam or OCT   Ophthalmic Meds Ordered this visit:  No orders of the defined types were placed in this encounter.      Return in about 1 day (around 04/16/2018) for POV.  There are no Patient Instructions on file for this visit.   Explained the diagnoses, plan, and follow up with the patient and they expressed understanding.  Patient expressed understanding of the importance of proper follow up care.   This document serves as a record of services personally performed by Gardiner Sleeper, MD, PhD. It was created on their behalf by Ernest Mallick, OA, an ophthalmic assistant. The creation of this record is the provider's dictation and/or activities during the visit.    Electronically signed by: Ernest Mallick, OA  01.07.2020 12:55 PM    Gardiner Sleeper, M.D., Ph.D. Diseases & Surgery of the Retina and [place] 04/15/18  I have reviewed the above documentation for accuracy and completeness, and  I agree with the above. Gardiner Sleeper, M.D., Ph.D. 04/15/18 12:55 PM     Abbreviations: M myopia (nearsighted); A astigmatism; H hyperopia (farsighted); P presbyopia; Mrx spectacle prescription;  CTL contact lenses; OD right eye; OS left eye; OU both eyes  XT exotropia; ET esotropia; PEK punctate epithelial keratitis; PEE punctate epithelial erosions; DES dry eye syndrome; MGD meibomian gland dysfunction; ATs artificial tears; PFAT's preservative free artificial tears; [place] nuclear sclerotic cataract; PSC posterior subcapsular cataract; ERM epi-retinal membrane; PVD posterior vitreous detachment; RD retinal detachment; DM diabetes mellitus; DR diabetic retinopathy; NPDR non-proliferative diabetic retinopathy; PDR proliferative diabetic  retinopathy; CSME clinically significant macular edema; DME diabetic macular edema; dbh dot blot hemorrhages; CWS cotton wool spot; POAG primary open angle glaucoma; C/D cup-to-disc ratio; HVF humphrey visual field; GVF goldmann visual field; OCT optical coherence tomography; IOP intraocular pressure; BRVO Branch retinal vein occlusion; CRVO central retinal vein occlusion; CRAO central retinal artery occlusion; BRAO branch retinal artery occlusion; RT retinal tear; SB scleral buckle; PPV pars plana vitrectomy; VH Vitreous hemorrhage; PRP panretinal laser photocoagulation; IVK intravitreal kenalog; VMT vitreomacular traction; MH Macular hole;  NVD neovascularization of the disc; NVE neovascularization elsewhere; AREDS age related eye disease study; ARMD age related macular degeneration; POAG primary open angle glaucoma; EBMD epithelial/anterior basement membrane dystrophy; ACIOL anterior chamber intraocular lens; IOL intraocular lens; PCIOL posterior chamber intraocular lens; Phaco/IOL phacoemulsification with intraocular lens placement; [place] photorefractive keratectomy; LASIK laser assisted in situ keratomileusis; HTN hypertension; DM diabetes mellitus; COPD chronic obstructive pulmonary disease

## 2018-04-15 NOTE — Anesthesia Preprocedure Evaluation (Addendum)
Anesthesia Evaluation  Patient identified by MRN, date of birth, ID band Patient awake    Reviewed: Allergy & Precautions, NPO status , Patient's Chart, lab work & pertinent test results  History of Anesthesia Complications Negative for: history of anesthetic complications  Airway Mallampati: III  TM Distance: >3 FB Neck ROM: Full  Mouth opening: Limited Mouth Opening  Dental  (+) Caps, Dental Advisory Given   Pulmonary asthma , COPD COPD inhaler, PE (h/o PE several years ago: required O2 at that time) breath sounds clear to auscultation        Cardiovascular hypertension, Pt. on medications - anginaRhythm:Regular Rate:Normal  '06 ECHO: EF 50-55%, valves OK   Neuro/Psych negative neurological ROS     GI/Hepatic Neg liver ROS, hiatal hernia, GERD-  Medicated and Controlled,  Endo/Other  negative endocrine ROS  Renal/GU Renal InsufficiencyRenal disease    Musculoskeletal   Abdominal   Peds  Hematology  (+) Blood dyscrasia   Anesthesia Other Findings   Reproductive/Obstetrics                                   Anesthesia Quick Evaluation   Anesthesia Plan  ASA: III  Anesthesia Plan: General   Post-op Pain Management:    Induction: Intravenous  PONV Risk Score and Plan:   Airway Management Planned: LMA  Additional Equipment:   Intra-op Plan:   Post-operative Plan: Extubation in OR  Informed Consent: I have reviewed the patients History and Physical, chart, labs and discussed the procedure including the risks, benefits and alternatives for the proposed anesthesia with the patient or authorized representative who has indicated his/her understanding and acceptance.     Dental advisory given  Plan Discussed with: CRNA, Surgeon and Anesthesiologist  Anesthesia Plan Comments:       Anesthesia Quick Evaluation

## 2018-04-15 NOTE — Transfer of Care (Signed)
Immediate Anesthesia Transfer of Care Note  Patient: Nancy Hicks  Procedure(s) Performed: PARS PLANA VITRECTOMY WITH 25 GAUGE (Right Eye) PHOTOCOAGULATION WITH LASER (Right Eye)  Patient Location: PACU  Anesthesia Type:General  Level of Consciousness: awake, alert  and oriented  Airway & Oxygen Therapy: Patient Spontanous Breathing  Post-op Assessment: Report given to RN and Post -op Vital signs reviewed and stable  Post vital signs: Reviewed and stable  Last Vitals:  Vitals Value Taken Time  BP 160/79 04/15/2018  6:25 PM  Temp    Pulse 71 04/15/2018  6:29 PM  Resp 15 04/15/2018  6:29 PM  SpO2 98 % 04/15/2018  6:29 PM  Vitals shown include unvalidated device data.  Last Pain:  Vitals:   04/15/18 1345  TempSrc: Oral  PainSc: 0-No pain      Patients Stated Pain Goal: 0 (73/56/70 1410)  Complications: No apparent anesthesia complications

## 2018-04-15 NOTE — Brief Op Note (Signed)
04/15/2018  6:29 PM  PATIENT:  Pristine F Yusupov  85 y.o. female  PRE-OPERATIVE DIAGNOSIS:  Vitreous hemorrhage and hyphema, RIGHT EYE  POST-OPERATIVE DIAGNOSIS:  Vitreous hemorrhage, hyphema, and retinal tears, RIGHT EYE  PROCEDURE:  Procedure(s): PARS PLANA VITRECTOMY WITH 25 GAUGE (Right) PHOTOCOAGULATION WITH LASER (Right)  SURGEON:  Surgeon(s) and Role:    Bernarda Caffey, MD - Primary  ASSISTANTS: none  ANESTHESIA:   general  EBL:  Minimal   BLOOD ADMINISTERED:none  DRAINS: none   LOCAL MEDICATIONS USED:  NONE  SPECIMEN:  No Specimen  DISPOSITION OF SPECIMEN:  N/A  COUNTS:  YES  TOURNIQUET:  * No tourniquets in log *  DICTATION: .Note written in EPIC  PLAN OF CARE: Discharge to home after PACU  PATIENT DISPOSITION:  PACU - hemodynamically stable.   Delay start of Pharmacological VTE agent (>24hrs) due to surgical blood loss or risk of bleeding: yes

## 2018-04-15 NOTE — Discharge Instructions (Addendum)
POSTOPERATIVE INSTRUCTIONS  Your doctor has performed vitreoretinal surgery on you at [facility]. [place] eye patched and shielded until seen by Dr. Coralyn Pear 11 AM tomorrow in clinic - Do not use drops until return - [place] - Sleep with head elevated 45-90 degrees, avoid laying flat on back  - No strenuous bending, stooping or lifting.  - You may not drive until further notice.  - If your doctor used a gas bubble in your eye during the procedure he will advise you on postoperative positioning. If you have a gas bubble you will be wearing a green bracelet that was applied in the operating room. The green bracelet should stay on as long as the gas bubble is in your eye. While the gas bubble is present you should not fly in an airplane. If you require general anesthesia while the gas bubble is present you must notify your anesthesiologist that an intraocular gas bubble is present so he can take the appropriate precautions.  - Tylenol or any other over-the-counter pain reliever can be used according to your doctor. If more pain medicine is required, your doctor will have a prescription for you.  - You may read, go up and down stairs, and watch television.     Bernarda Caffey, M.D., Ph.D.

## 2018-04-15 NOTE — Anesthesia Postprocedure Evaluation (Signed)
Anesthesia Post Note  Patient: Nancy Hicks  Procedure(s) Performed: PARS PLANA VITRECTOMY WITH 25 GAUGE (Right Eye) PHOTOCOAGULATION WITH LASER (Right Eye)     Patient location during evaluation: PACU Anesthesia Type: General Level of consciousness: sedated and patient cooperative Pain management: pain level controlled Vital Signs Assessment: post-procedure vital signs reviewed and stable Respiratory status: spontaneous breathing Cardiovascular status: stable Anesthetic complications: no    Last Vitals:  Vitals:   04/15/18 1926 04/15/18 1941  BP: (!) 149/80 (!) 166/75  Pulse: 77 76  Resp: 15 17  Temp:    SpO2: 96% 94%    Last Pain:  Vitals:   04/15/18 1926  TempSrc:   PainSc: 0-No pain                 Nolon Nations

## 2018-04-15 NOTE — Op Note (Signed)
Date of procedure: 1.17.20   Surgeon: Bernarda Caffey, MD, PhD   Pre-operative Diagnoses:  1. Non-clearing vitreous hemorrhage, Right Eye 2. Hyphema, Right Eye   Post-operative diagnosis: Rhegmatogenous Retinal Detachment Right Eye 1. Non-clearing vitreous hemorrhage, Right Eye 2. Hyphema, Right Eye 3. Retinal breaks, Right Eye   Anesthesia: GETA   Procedure: 1.  25 gauge pars plana vitrectomy, Right Eye CPT 67140  2.  Anterior chamber washout, Right Eye  CPT 65800 3.  Endolaser, Right Eye 4.  Fluid Air Exchange, Right Eye   Complications: none Estimated blood loss: minimal Specimens: none   Brief history:  The patient has a history of decreased vision in the affected eye, and on examination, was noted to have a dense, nonclearing vitreous hemorrhage and hyphema, affecting activities of daily living.  The risks, benefits, and alternatives were explained to the patient, including pain, bleeding, infection, loss of vision, double vision, droopy eyelids, and need for more surgeries.  Informed consent was obtained from the patient and placed in the chart.       Procedure: The patient was brought to the preoperative holding area where the correct eye was confirmed and marked.  The patient was then brought to the operating room where general endotracheal anesthesia was induced. A secondary time-out was performed to identify the correct patient, eye, procedure, and any allergies. The eye was prepped and draped in the usual sterile ophthalmic fashion followed by placement of a lid speculum.  A 25 gauge trocar was placed in the inferotemporal quadrant in a beveled fashion. A 4 mm infusion cannula was placed through this trocar. The infusion cannula was confirmed in the vitreous cavity with no incarceration of retina or choroid prior to turning it on. Two additional 25 gauge trocars were placed in the superonasal and superotemporal quadrants (2 and 10 oclock, respectively) in a similar beveled  fashion. At this time, anterior vitrectomy was performed under direct visualization. Then, a standard three-port pars plana vitrectomy was performed using the light pipe, the cutter, and the BIOM viewing system. A thorough core and peripheral vitreous dissection was performed. Of note, there was a dense vitreous hemorrhage and diffuse hyphema obscuring the view of the posterior pole. A posterior vitreous detachment was confirmed over the optic nerve with the assistance of kenalog.  A 20g MVR blade was used to create an anterior chamber paracentesis at 1100 oclock. Using this paracentesis and the vitrectomy probe, an anterior chamber washout was performed to clear out the hyphema. This vastly improved our view to the posterior pole. On inspection of the peripheral retina, there was an irregular retinal break at 1130 and an operculated retinal hole at 0900 -- both of which did not have SRF or focal RD.     Using scleral depression, the blood stained vitreous base was carefully removed. Then endolaser was applied around both retinal breaks in 3+ confluent rows. A complete fluid-air exchange was performed with a soft tip extrusion cannula. After completion of these maneuvers, the posterior pole and peripheral retina were noted to be flat.  At this time, the trocars were removed and sutured with 7-0 vicryl in an interrupted fashion.  Filtered air was injected through the pars plana using a 30g needle to bring the globe back to physiologic pressure.Subconjunctival injections of Antibiotic and kenalog were administered. The lid speculum and drapes were removed. Drops of an antibiotic, antihypertensives, and steroid were given. The eye was patched and shielded. The patient tolerated the procedure well without any intraoperative or  immediate postoperative complications. The patient was taken to the recovery room in good condition. The patient was instructed to maintain a head-upright position and will be seen by Dr.  Coralyn Pear tomorrow morning in clinic.

## 2018-04-15 NOTE — Progress Notes (Signed)
[facility] Note  04/16/2018     CHIEF COMPLAINT Patient presents for Post-op Follow-up   HISTORY OF PRESENT ILLNESS: Nancy Hicks is a 85 y.o. female who presents to the clinic today for:   HPI    Post-op Follow-up    In right eye.  Discomfort includes foreign body sensation.  Negative for pain.  I, the attending physician,  performed the HPI with the patient and updated documentation appropriately.          Comments    POD1 s/p 25g PPV/EL/FAX and AC washout OD for VH and hyphema, OD. Did well overnight. Mild FBS. Slept with head upright       Last edited by Bernarda Caffey, MD on 04/16/2018 11:31 AM. (History)    Reports compliance with keeping head upright and limiting activities and head movements.  Referring physician: Asencion Noble, MD [address] [place], [place] [address]  HISTORICAL INFORMATION:   Selected notes from the MEDICAL RECORD NUMBER    CURRENT MEDICATIONS: Current Outpatient Medications (Ophthalmic Drugs)  Medication Sig  . brimonidine (ALPHAGAN) 0.2 % ophthalmic solution Place 1 drop into the right eye 2 (two) times daily.  . dorzolamide (TRUSOPT) 2 % ophthalmic solution Place 1 drop into the right eye 2 (two) times daily.  Vladimir Faster Glycol-Propyl Glycol (SYSTANE ULTRA PF) 0.4-0.3 % SOLN Place 2 drops into both eyes 3 (three) times daily.   . prednisoLONE acetate (PRED FORTE) 1 % ophthalmic suspension Place 1 drop into the right eye every hour while awake.   No current facility-administered medications for this visit.  (Ophthalmic Drugs)   Current Outpatient Medications (Other)  Medication Sig  . ADVAIR DISKUS 100-50 MCG/DOSE AEPB take 1 tablet twice a day (Patient taking differently: Inhale 1 puff into the lungs 2 (two) times daily. )  . allopurinol (ZYLOPRIM) 300 MG tablet Take 300 mg by mouth daily.    . Biotin 5000 MCG CAPS Take 1 capsule by mouth daily.  Marland Kitchen CARTIA XT 300 MG 24 hr capsule Take 300 mg by  mouth daily.   . chlorthalidone (HYGROTON) 25 MG tablet Take 25 mg by mouth daily.  . Cholecalciferol (VITAMIN D3) 2000 UNITS TABS Take 1 tablet by mouth daily.    Marland Kitchen esomeprazole (NEXIUM) 40 MG capsule Take 40 mg by mouth daily before breakfast.   . fluticasone (FLONASE) 50 MCG/ACT nasal spray instill 2 sprays into each nostril once daily (Patient taking differently: Place 2 sprays into both nostrils daily as needed for allergies or rhinitis. )  . losartan (COZAAR) 100 MG tablet Take 100 mg by mouth daily.   . methenamine (HIPREX) 1 G tablet Take 500 mg by mouth 2 (two) times daily with a meal.   . methylcellulose (CITRUCEL) oral powder Take 1 packet by mouth 3 (three) times daily. Take 1 tsp by mouth daily   . Multiple Vitamin (MULITIVITAMIN WITH MINERALS) TABS Take 1 tablet by mouth daily.  . Multiple Vitamins-Minerals (OCUVITE-LUTEIN PO) Take 1 tablet by mouth daily.  . sertraline (ZOLOFT) 50 MG tablet Take 50 mg by mouth at bedtime.   . simvastatin (ZOCOR) 10 MG tablet Take 10 mg by mouth at bedtime.    . VENTOLIN HFA 108 (90 BASE) MCG/ACT inhaler inhale 2 puffs by mouth every 6 hours AS NEEDED FOR WHEEZING (Patient taking differently: Inhale 2 puffs into the lungs every 6 (six) hours as needed for wheezing or shortness of breath. )  . warfarin (COUMADIN) 2.5  MG tablet Take 2.5 mg by mouth every evening. Takes 2.5 mg everyday.   No current facility-administered medications for this visit.  (Other)      REVIEW OF SYSTEMS: ROS    Positive for: Cardiovascular, Eyes, Heme/Lymph   Negative for: Constitutional, Gastrointestinal, Neurological, Skin, Genitourinary, Musculoskeletal, HENT, Endocrine, Respiratory, Psychiatric, Allergic/Imm   Last edited by Bernarda Caffey, MD on 04/16/2018 10:47 AM. (History)       ALLERGIES Allergies  Allergen Reactions  . Metoclopramide Hcl Other (See Comments)    REACTION: causes uncontrolable leg movements  . Nitrofurantoin Nausea And Vomiting and Other  (See Comments)    REACTION: dizziness    PAST MEDICAL HISTORY Past Medical History:  Diagnosis Date  . Anemia, unspecified   . Anxiety state, unspecified   . Blood dyscrasia    RV factor def  . Cancer ([place])    skin  . Degeneration of cervical intervertebral disc   . Disorder of bone and cartilage, unspecified   . Diverticulosis of colon (without mention of hemorrhage) 01/13/1999  . Esophageal reflux   . Esophageal stricture   . Factor V deficiency ([place])   . Factor V deficiency ([place])   . Hiatal hernia 04/09/2010   ? Paraesophageal component on chest CT 2006  . HTN (hypertension)   . Irritable bowel syndrome   . Neurogenic bladder   . Osteoarthrosis, unspecified whether generalized or localized, unspecified site   . Peripheral vascular disease (HCC)    pe, dvt hx  . Personal history of thrombophlebitis   . Personal history of venous thrombosis and embolism   . 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 Pure hypercholesterolemia   . Raynaud phenomenon   . Shortness of breath dyspnea   . Unspecified asthma(493.90)   . Unspecified disorder resulting from impaired renal function    cysts   Past Surgical History:  Procedure Laterality Date  . CHOLECYSTECTOMY    . COLONOSCOPY  2000   Dr. Verl Blalock: moderate to severe sigmoid diverticulosis  . COLONOSCOPY  03/2015   Dr. Peggye Form at [facility]: TCS for abnormal sigmoid colon on PET. Moderate diverticulosis of the sigmoid colon, sessile 8-9 mm sigmoid: Polyp biopsied (given anticoagulation), two 3 mm ICV polyps removed. All polyps were tubular adenomas.  . ESOPHAGEAL MANOMETRY  10/26/2011   Procedure: ESOPHAGEAL MANOMETRY (EM);  Surgeon: Sable Feil, MD;  Location: [facility];  Service: Endoscopy;  Laterality: N/A;  . ESOPHAGOGASTRODUODENOSCOPY  03/2010   Dr. Verl Blalock: candida esophagitis, prolapsing hh with cameron lesions, multiple polyps  . EYE SURGERY Bilateral    cataracts  . Flexible sigmoidoscopy   05/2015   Dr. Peggye Form at [facility]: Performed off anticoagulation. 1-1.5 cm sessile sigmoid polyp removed, site tattooed. Pathology revealed tubular adenoma. Colonoscopy in 3-5 years as medically appropriate.  Marland Kitchen PAROTIDECTOMY Left 11/14/2014  . PAROTIDECTOMY Left 11/14/2014   Procedure: LEFT LATERAL PAROTIDECTOMY;  Surgeon: Leta Baptist, MD;  Location: [facility];  Service: ENT;  Laterality: Left;  . PARS PLANA VITRECTOMY Right 04/15/2018   Procedure: PARS PLANA VITRECTOMY WITH 25 GAUGE;  Surgeon: Bernarda Caffey, MD;  Location: [facility];  Service: Ophthalmology;  Laterality: Right;  . PHOTOCOAGULATION WITH LASER Right 04/15/2018   Procedure: PHOTOCOAGULATION WITH LASER;  Surgeon: Bernarda Caffey, MD;  Location: [facility];  Service: Ophthalmology;  Laterality: Right;  . 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 TONSILLECTOMY      FAMILY HISTORY Family History  Problem Relation Age of Onset  .  Parkinsonism Mother   . COPD Brother   . Asthma Brother   . Lung cancer Brother   . Heart attack Brother   . Diabetes Paternal Aunt     SOCIAL HISTORY Social History   Tobacco Use  . Smoking status: Never Smoker  . Smokeless tobacco: Never Used  Substance Use Topics  . Alcohol use: No  . Drug use: No         OPHTHALMIC EXAM:  Base Eye Exam    Visual Acuity (Snellen - Linear)      Right Left   Dist [place] CF 1'        Tonometry (Tonopen, 11:05 AM)      Right Left   Pressure 4 12       Pupils      Dark Light Shape React APD   Right 5 5 oval none -   Left 3 2 Round + -       Extraocular Movement      Right Left    Full Full       Neuro/Psych    Oriented x3:  Yes   Mood/Affect:  Normal        Slit Lamp and Fundus Exam    Slit Lamp Exam      Right Left   Lids/Lashes dermato dermato   Conjunctiva/Sclera White and quiet White and quiet   Cornea arcus; 3+ PEE greatest inf, 1+ Descemet's folds arcus; 2+ PEE   Anterior Chamber Deep; 2-3+ cell Deep and quiet   Iris round, dilated Round and  reactive   Lens PCIOL PCIOL   Vitreous post vitrectomy; ~80-90% air bubble; no heme syneresis, Posterior vitreous detachment       Fundus Exam      Right Left   Disc Normal; compact    C/D Ratio  0.1   Macula flat; attached    Vessels attenuated    Periphery attached; laser at 12 and 9 oclock around retinal breaks           IMAGING AND PROCEDURES  Imaging and Procedures for 04/03/18           ASSESSMENT/PLAN:    ICD-10-CM   1. Vitreous hemorrhage, right eye (HCC) H43.11   2. Hyphema of right eye H21.01   3. Essential hypertension I10   4. Hypertensive retinopathy of both eyes H35.033   5. Factor V deficiency ([place]) D68.2   6. Retinal hole, left H33.322   7. Posterior vitreous detachment of left eye H43.812   8. Pseudophakia of both eyes Z96.1     1,2. Vitreous hemorrhage with hyphema OD - acute onset on Friday evening ~0600 pm -- no pain or photopsias - diffuse VH with spillover into AC - BCVA at initial presentation 20/80 OD -- progressed over the week - pt with history of Factor V Leiden deficiency -- on coumadin - also has history of HTN which was recently uncontrolled due to medication changes - also reports recent violent coughing spells - now POD1 s/p PPV/EL/FAX + AC washout OD, 1.17.20  - intra-op -- dense heme in vit and AC -- two retinal tears noted intra-op and lasered  - suspect retinal break is etiology of heme + coumadin             - doing well this morning             - retina attached and in good position -- good laser surrounding retinal breaks             -  IOP low (4 mmHg) with 80-90% air bubble             - start   PF 6x/day OD                         zymaxid QID OD                         Atropine BID OD                         PSO ung QID OD             - keep head upright             - eye shield when sleeping/resting             - post op drop and positioning instructions reviewed             - tylenol/ibuprofen for pain - f/u  Friday, 1.24.20, 0845 am  2,3. Hypertensive retinopathy OU - discussed importance of tight BP control - monitor  4. Factor V Leiden - history of DVT and PE - currently on coumadin, 2.5 mg daily - pre-op INR was 1.65 - spoke with Dr. Willey Blade preoperatively who stated it is okay to hold coumadin perioperatively - pt to restart coumadin tomorrow (Sunday)  5. Operculated retinal hole OS - pigmented operculated hole without SRF at 0130 -- appears chronic and stable but recommend laser retinopexy to retinal break once VH a little more stable - The incidence, risk factors, and natural history of retinal tear was discussed with patient.   - Potential treatment options including laser retinopexy and cryotherapy discussed with patient. - monitor for now -- laser retinopexy once VH more stable  6. PVD / vitreous syneresis  Discussed findings and prognosis  No RT or RD on 360 scleral depressed exam  Reviewed s/s of RT/RD  Strict return precautions for any such RT/RD signs/symptoms  7. Pseudophakia OU  - s/p CE/IOL OU by Dr. Dolores Lory  - doing well  - monitor  8. No retinal edema on exam or OCT   Ophthalmic Meds Ordered this visit:  No orders of the defined types were placed in this encounter.      Return in about 6 days (around 04/22/2018) for POV.  There are no Patient Instructions on file for this visit.   Explained the diagnoses, plan, and follow up with the patient and they expressed understanding.  Patient expressed understanding of the importance of proper follow up care.    Gardiner Sleeper, M.D., Ph.D. Diseases & Surgery of the Retina and Vitreous [facility] 04/16/18    Abbreviations: M myopia (nearsighted); A astigmatism; H hyperopia (farsighted); P presbyopia; Mrx spectacle prescription;  CTL contact lenses; OD right eye; OS left eye; OU both eyes  XT exotropia; ET esotropia; PEK punctate epithelial keratitis; PEE punctate epithelial erosions; DES  dry eye syndrome; MGD meibomian gland dysfunction; ATs artificial tears; PFAT's preservative free artificial tears; [place] nuclear sclerotic cataract; PSC posterior subcapsular cataract; ERM epi-retinal membrane; PVD posterior vitreous detachment; RD retinal detachment; DM diabetes mellitus; DR diabetic retinopathy; NPDR non-proliferative diabetic retinopathy; PDR proliferative diabetic retinopathy; CSME clinically significant macular edema; DME diabetic macular edema; dbh dot blot hemorrhages; CWS cotton wool spot; POAG primary open angle glaucoma; C/D cup-to-disc ratio; HVF humphrey visual field; GVF goldmann visual field; OCT  optical coherence tomography; IOP intraocular pressure; BRVO Branch retinal vein occlusion; CRVO central retinal vein occlusion; CRAO central retinal artery occlusion; BRAO branch retinal artery occlusion; RT retinal tear; SB scleral buckle; PPV pars plana vitrectomy; VH Vitreous hemorrhage; PRP panretinal laser photocoagulation; IVK intravitreal kenalog; VMT vitreomacular traction; MH Macular hole;  NVD neovascularization of the disc; NVE neovascularization elsewhere; AREDS age related eye disease study; ARMD age related macular degeneration; POAG primary open angle glaucoma; EBMD epithelial/anterior basement membrane dystrophy; ACIOL anterior chamber intraocular lens; IOL intraocular lens; PCIOL posterior chamber intraocular lens; Phaco/IOL phacoemulsification with intraocular lens placement; [place] photorefractive keratectomy; LASIK laser assisted in situ keratomileusis; HTN hypertension; DM diabetes mellitus; COPD chronic obstructive pulmonary disease

## 2018-04-15 NOTE — H&P (Signed)
Nancy Hicks is an 85 y.o. female.    Chief Complaint: Decreased vision OD  HPI: Pt with history of Factor V Leiden on coumadin who presented on 04/03/18 with decreased vision OD and found to have diffuse vitreous hemorrhage OD. Over the last 1-2 wks, the hemorrhage has increased in density and spread to the anterior chamber.  Past Medical History:  Diagnosis Date  . Anemia, unspecified   . Anxiety state, unspecified   . Blood dyscrasia    RV factor def  . Cancer ([place])    skin  . Degeneration of cervical intervertebral disc   . Disorder of bone and cartilage, unspecified   . Diverticulosis of colon (without mention of hemorrhage) 01/13/1999  . Esophageal reflux   . Esophageal stricture   . Factor V deficiency ([place])   . Factor V deficiency ([place])   . Hiatal hernia 04/09/2010   ? Paraesophageal component on chest CT 2006  . HTN (hypertension)   . Irritable bowel syndrome   . Neurogenic bladder   . Osteoarthrosis, unspecified whether generalized or localized, unspecified site   . Peripheral vascular disease (HCC)    pe, dvt hx  . Personal history of thrombophlebitis   . Personal history of venous thrombosis and embolism   . 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 Pure hypercholesterolemia   . Raynaud phenomenon   . Shortness of breath dyspnea   . Unspecified asthma(493.90)   . Unspecified disorder resulting from impaired renal function    cysts    Past Surgical History:  Procedure Laterality Date  . CHOLECYSTECTOMY    . COLONOSCOPY  2000   Dr. Verl Blalock: moderate to severe sigmoid diverticulosis  . COLONOSCOPY  03/2015   Dr. Peggye Form at [facility]: TCS for abnormal sigmoid colon on PET. Moderate diverticulosis of the sigmoid colon, sessile 8-9 mm sigmoid: Polyp biopsied (given anticoagulation), two 3 mm ICV polyps removed. All polyps were tubular adenomas.  . ESOPHAGEAL MANOMETRY  10/26/2011   Procedure: ESOPHAGEAL MANOMETRY (EM);  Surgeon: Sable Feil, MD;  [facility];  Service: Endoscopy;  Laterality: N/A;  . ESOPHAGOGASTRODUODENOSCOPY  03/2010   Dr. Verl Blalock: candida esophagitis, prolapsing hh with cameron lesions, multiple polyps  . EYE SURGERY Bilateral    cataracts  . Flexible sigmoidoscopy  05/2015   Dr. Peggye Form at [facility]: Performed off anticoagulation. 1-1.5 cm sessile sigmoid polyp removed, site tattooed. Pathology revealed tubular adenoma. Colonoscopy in 3-5 years as medically appropriate.  Marland Kitchen PAROTIDECTOMY Left 11/14/2014  . PAROTIDECTOMY Left 11/14/2014   Procedure: LEFT LATERAL PAROTIDECTOMY;  Surgeon: Leta Baptist, MD;  Location: [place];  Service: ENT;  Laterality: Left;  . 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 TONSILLECTOMY      Family History  Problem Relation Age of Onset  . Parkinsonism Mother   . COPD Brother   . Asthma Brother   . Lung cancer Brother   . Heart attack Brother   . Diabetes Paternal Aunt    Social History:  reports that she has never smoked. She has never used smokeless tobacco. She reports that she does not drink alcohol or use drugs.  Allergies:  Allergies  Allergen Reactions  . Metoclopramide Hcl Other (See Comments)    REACTION: causes uncontrolable leg movements  . Nitrofurantoin Nausea And Vomiting and Other (See Comments)    REACTION: dizziness    No medications prior to admission.    Review of systems otherwise negative  There  were no vitals taken for this visit.  Physical exam: Mental status: oriented x3. Eyes: See eye exam associated with this date of surgery Ears, Nose, Throat: within normal limits Neck: Within Normal limits General: within normal limits Chest: Within normal limits Breast: deferred Heart: Within normal limits Abdomen: Within normal limits GU: deferred Extremities: within normal limits Skin: within normal limits  Assessment/Plan 1. Persistent vitreous hemorrhage with hyphema OD  Plan: To [facility] for emergent 25g PPV  and possible anterior chamber washout, OD under general anesthesia - case scheduled for 330 pm today in [facility] OR 08  Gardiner Sleeper, M.D., Ph.D. Vitreoretinal Surgeon [facility]

## 2018-04-16 ENCOUNTER — Encounter (HOSPITAL_COMMUNITY): Payer: Self-pay | Admitting: Ophthalmology

## 2018-04-16 ENCOUNTER — Ambulatory Visit (INDEPENDENT_AMBULATORY_CARE_PROVIDER_SITE_OTHER): Payer: Medicare Other | Admitting: Ophthalmology

## 2018-04-16 DIAGNOSIS — Z961 Presence of intraocular lens: Secondary | ICD-10-CM

## 2018-04-16 DIAGNOSIS — D682 Hereditary deficiency of other clotting factors: Secondary | ICD-10-CM

## 2018-04-16 DIAGNOSIS — H4311 Vitreous hemorrhage, right eye: Secondary | ICD-10-CM

## 2018-04-16 DIAGNOSIS — H43812 Vitreous degeneration, left eye: Secondary | ICD-10-CM

## 2018-04-16 DIAGNOSIS — H2101 Hyphema, right eye: Secondary | ICD-10-CM

## 2018-04-16 DIAGNOSIS — H35033 Hypertensive retinopathy, bilateral: Secondary | ICD-10-CM

## 2018-04-16 DIAGNOSIS — I10 Essential (primary) hypertension: Secondary | ICD-10-CM

## 2018-04-16 DIAGNOSIS — H33322 Round hole, left eye: Secondary | ICD-10-CM

## 2018-04-21 NOTE — Progress Notes (Signed)
[facility] Note  04/22/2018     CHIEF COMPLAINT Patient presents for Post-op Follow-up   HISTORY OF PRESENT ILLNESS: Nancy Hicks is a 85 y.o. female who presents to the clinic today for:   HPI    Post-op Follow-up    In right eye.  Discomfort includes itching and foreign body sensation.  Vision is stable.  I, the attending physician,  performed the HPI with the patient and updated documentation appropriately.          Comments    85 y/o female pt here for 1 wk po s/p PPV/EL/FAX + AC Washout OD 01.17.20.  No change in [place] OU.  Denies pain, flashes, floaters, but c/o irritation and FBS OD.  PF 6x/day OD Zymaxid QID OD Atropine BID OD PSO ung QID OD         Last edited by Bernarda Caffey, MD on 04/22/2018  9:39 AM. (History)    pt states her eyes have been itchy and irritated over the past week, she states she can see a line in her vision, she states her vision is still very blurry, she states it is not black, but very blurry  Referring physician: Asencion Noble, MD [address] [place], [place] [address]  HISTORICAL INFORMATION:   Selected notes from the [facility]: Current Outpatient Medications (Ophthalmic Drugs)  Medication Sig  . brimonidine (ALPHAGAN) 0.2 % ophthalmic solution Place 1 drop into the right eye 2 (two) times daily.  . dorzolamide (TRUSOPT) 2 % ophthalmic solution Place 1 drop into the right eye 2 (two) times daily.  Vladimir Faster Glycol-Propyl Glycol (SYSTANE ULTRA PF) 0.4-0.3 % SOLN Place 2 drops into both eyes 3 (three) times daily.   . prednisoLONE acetate (PRED FORTE) 1 % ophthalmic suspension Place 1 drop into the right eye every hour while awake.   No current facility-administered medications for this visit.  (Ophthalmic Drugs)   Current Outpatient Medications (Other)  Medication Sig  . ADVAIR DISKUS 100-50 MCG/DOSE AEPB take 1 tablet twice a day (Patient taking  differently: Inhale 1 puff into the lungs 2 (two) times daily. )  . allopurinol (ZYLOPRIM) 300 MG tablet Take 300 mg by mouth daily.    . Biotin 5000 MCG CAPS Take 1 capsule by mouth daily.  Marland Kitchen CARTIA XT 300 MG 24 hr capsule Take 300 mg by mouth daily.   . chlorthalidone (HYGROTON) 25 MG tablet Take 25 mg by mouth daily.  . Cholecalciferol (VITAMIN D3) 2000 UNITS TABS Take 1 tablet by mouth daily.    Marland Kitchen esomeprazole (NEXIUM) 40 MG capsule Take 40 mg by mouth daily before breakfast.   . fluticasone (FLONASE) 50 MCG/ACT nasal spray instill 2 sprays into each nostril once daily (Patient taking differently: Place 2 sprays into both nostrils daily as needed for allergies or rhinitis. )  . losartan (COZAAR) 100 MG tablet Take 100 mg by mouth daily.   . methenamine (HIPREX) 1 G tablet Take 500 mg by mouth 2 (two) times daily with a meal.   . methylcellulose (CITRUCEL) oral powder Take 1 packet by mouth 3 (three) times daily. Take 1 tsp by mouth daily   . Multiple Vitamin (MULITIVITAMIN WITH MINERALS) TABS Take 1 tablet by mouth daily.  . Multiple Vitamins-Minerals (OCUVITE-LUTEIN PO) Take 1 tablet by mouth daily.  . sertraline (ZOLOFT) 50 MG tablet Take 50 mg by mouth at bedtime.   . simvastatin (ZOCOR) 10 MG  tablet Take 10 mg by mouth at bedtime.    . VENTOLIN HFA 108 (90 BASE) MCG/ACT inhaler inhale 2 puffs by mouth every 6 hours AS NEEDED FOR WHEEZING (Patient taking differently: Inhale 2 puffs into the lungs every 6 (six) hours as needed for wheezing or shortness of breath. )  . warfarin (COUMADIN) 2.5 MG tablet Take 2.5 mg by mouth every evening. Takes 2.5 mg everyday.   No current facility-administered medications for this visit.  (Other)      REVIEW OF SYSTEMS: ROS    Positive for: Gastrointestinal, Genitourinary, Cardiovascular, Eyes, Psychiatric   Negative for: Constitutional, Neurological, Skin, Musculoskeletal, HENT, Endocrine, Respiratory, Allergic/Imm, Heme/Lymph   Last edited by  Matthew Folks, COA on 04/22/2018  8:58 AM. (History)       ALLERGIES Allergies  Allergen Reactions  . Metoclopramide Hcl Other (See Comments)    REACTION: causes uncontrolable leg movements  . Nitrofurantoin Nausea And Vomiting and Other (See Comments)    REACTION: dizziness    PAST MEDICAL HISTORY Past Medical History:  Diagnosis Date  . Anemia, unspecified   . Anxiety state, unspecified   . Blood dyscrasia    RV factor def  . Cancer ([place])    skin  . Degeneration of cervical intervertebral disc   . Disorder of bone and cartilage, unspecified   . Diverticulosis of colon (without mention of hemorrhage) 01/13/1999  . Esophageal reflux   . Esophageal stricture   . Factor V deficiency ([place])   . Factor V deficiency ([place])   . Hiatal hernia 04/09/2010   ? Paraesophageal component on chest CT 2006  . HTN (hypertension)   . Irritable bowel syndrome   . Neurogenic bladder   . Osteoarthrosis, unspecified whether generalized or localized, unspecified site   . Peripheral vascular disease (HCC)    pe, dvt hx  . Personal history of thrombophlebitis   . Personal history of venous thrombosis and embolism   . 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 Pure hypercholesterolemia   . Raynaud phenomenon   . Shortness of breath dyspnea   . Unspecified asthma(493.90)   . Unspecified disorder resulting from impaired renal function    cysts   Past Surgical History:  Procedure Laterality Date  . CHOLECYSTECTOMY    . COLONOSCOPY  2000   Dr. Verl Blalock: moderate to severe sigmoid diverticulosis  . COLONOSCOPY  03/2015   Dr. Peggye Form at [facility]: TCS for abnormal sigmoid colon on PET. Moderate diverticulosis of the sigmoid colon, sessile 8-9 mm sigmoid: Polyp biopsied (given anticoagulation), two 3 mm ICV polyps removed. All polyps were tubular adenomas.  . ESOPHAGEAL MANOMETRY  10/26/2011   Procedure: ESOPHAGEAL MANOMETRY (EM);  Surgeon: Sable Feil, MD;  Location: [facility]  ENDOSCOPY;  Service: Endoscopy;  Laterality: N/A;  . ESOPHAGOGASTRODUODENOSCOPY  03/2010   Dr. Verl Blalock: candida esophagitis, prolapsing hh with cameron lesions, multiple polyps  . EYE SURGERY Bilateral    cataracts  . Flexible sigmoidoscopy  05/2015   Dr. Peggye Form at [facility]: Performed off anticoagulation. 1-1.5 cm sessile sigmoid polyp removed, site tattooed. Pathology revealed tubular adenoma. Colonoscopy in 3-5 years as medically appropriate.  Marland Kitchen PAROTIDECTOMY Left 11/14/2014  . PAROTIDECTOMY Left 11/14/2014   Procedure: LEFT LATERAL PAROTIDECTOMY;  Surgeon: Leta Baptist, MD;  Location: [facility];  Service: ENT;  Laterality: Left;  . PARS PLANA VITRECTOMY Right 04/15/2018   Procedure: PARS PLANA VITRECTOMY WITH 25 GAUGE;  Surgeon: Bernarda Caffey, MD;  Location: [facility];  Service:  Ophthalmology;  Laterality: Right;  . PHOTOCOAGULATION WITH LASER Right 04/15/2018   Procedure: PHOTOCOAGULATION WITH LASER;  Surgeon: Bernarda Caffey, MD;  Location: [facility];  Service: Ophthalmology;  Laterality: Right;  . 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 TONSILLECTOMY      FAMILY HISTORY Family History  Problem Relation Age of Onset  . Parkinsonism Mother   . COPD Brother   . Asthma Brother   . Lung cancer Brother   . Heart attack Brother   . Diabetes Paternal Aunt     SOCIAL HISTORY Social History   Tobacco Use  . Smoking status: Never Smoker  . Smokeless tobacco: Never Used  Substance Use Topics  . Alcohol use: No  . Drug use: No         OPHTHALMIC EXAM:  Base Eye Exam    Visual Acuity (Snellen - Linear)      Right Left   Dist cc HM 20/30   Dist ph cc NI 20/25   Correction:  Glasses       Tonometry (Tonopen, 9:01 AM)      Right Left   Pressure 16 14       Pupils      Dark Light Shape React APD   Right 5 5 Oval No None   Left 3 2 Round Brisk None  Pharm dilated OD       Visual Fields (Counting fingers)      Left Right    Full    Restrictions  Total superior temporal,  inferior temporal, superior nasal, inferior nasal deficiencies       Extraocular Movement      Right Left    Full, Ortho Full, Ortho       Neuro/Psych    Oriented x3:  Yes   Mood/Affect:  Normal       Dilation    Right eye:  1.0% Mydriacyl, 2.5% Phenylephrine @ 9:01 AM        Slit Lamp and Fundus Exam    Slit Lamp Exam      Right Left   Lids/Lashes Telangiectasia dermato   Conjunctiva/Sclera 1+ Injection, sutures intact White and quiet   Cornea arcus; 1+ PEE greatest inf, 1+ Descemet's folds, micro hyphema, 4+RBC's arcus; 2+ PEE   Anterior Chamber Deep; 2-3+ cell/RBC Deep and quiet   Iris round, poorly dilated to 4.5; + iridodonesis Round and reactive   Lens PCIOL PCIOL   Vitreous post vitrectomy; ~10% air bubble; diffuse vitreous heme, no view syneresis, Posterior vitreous detachment       Fundus Exam      Right Left   Disc no view    C/D Ratio  0.1   Macula hazy view    Vessels attenuated; hazy view    Periphery hazy view; grossly attached           IMAGING AND PROCEDURES  Imaging and Procedures for 04/03/18  OCT, Retina - OU - Both Eyes       Right Eye Quality was poor. Central Foveal Thickness: (No image obtained). Progression has no prior data. Findings include (Single line scans -- uninterpretable).   Left Eye Quality was good. Central Foveal Thickness: 284. Progression has been stable. Findings include normal foveal contour, no IRF, no SRF (Tr ERM).   Notes *Images captured and stored on drive  Diagnosis / Impression:  OD: Single line scans -- uninterpretable OS: NFP; no IRF/SRF; tr ERM    Clinical management:  See below  Abbreviations: NFP - Normal foveal profile. CME - cystoid macular edema. PED - pigment epithelial detachment. IRF - intraretinal fluid. SRF - subretinal fluid. EZ - ellipsoid zone. ERM - epiretinal membrane. ORA - outer retinal atrophy. ORT - outer retinal tubulation. SRHM - subretinal hyper-reflective material                  ASSESSMENT/PLAN:    ICD-10-CM   1. Vitreous hemorrhage, right eye (HCC) H43.11   2. Hyphema of right eye H21.01   3. Essential hypertension I10   4. Hypertensive retinopathy of both eyes H35.033   5. Factor V deficiency ([place]) D68.2   6. Retinal hole, left H33.322   7. Posterior vitreous detachment of left eye H43.812   8. Pseudophakia of both eyes Z96.1   9. Retinal edema H35.81 OCT, Retina - OU - Both Eyes    1,2. Vitreous hemorrhage with hyphema OD - acute onset on Friday evening ~0600 pm -- no pain or photopsias - BCVA at initial presentation 20/80 OD -- progressed over the week - pt with history of Factor V Leiden deficiency -- on coumadin - also has history of HTN which was recently uncontrolled due to medication changes - also reports recent violent coughing spells - now POW1 s/p PPV/EL/FAX + AC washout OD, 1.17.20  - intra-op -- dense heme in vit and AC -- two retinal tears noted intra-op and lasered  - today, vision not improved--recurrent diffuse VH with spillover into AC  - iridodonesis quite prevalent -- IOL may be rubbing iris and causing heme -- may need UBM to look at IOL position  - air bubble ~10% superiorly, remainder is diffuse VH and new view of posterior pole             - IOP normal (16 mmHg) with 10% air bubble             - cont   PF 6x/day OD                         zymaxid QID OD-- stop when bottle runs out                         Atropine BID OD                         PSO ung QID OD             - keep head upright             - eye shield when sleeping/resting             - post op drop and positioning instructions reviewed  - VH precautions reviewed -- minimize activities, keep head elevated, avoid ASA/NSAIDs/blood thinners as able - f/u Wednesday, January 29 for recheck  3,4. Hypertensive retinopathy OU - discussed importance of tight BP control - monitor  5. Factor V Leiden - history of DVT and PE - currently on coumadin,  2.5 mg daily - pre-op INR was 1.65 - spoke with Dr. Willey Blade preoperatively who stated it is okay to hold coumadin perioperatively - pt restarted Coumadin on Sunday, 01.19.20  6. Operculated retinal hole OS - pigmented operculated hole without SRF at 0130 -- appears chronic and stable but recommend laser retinopexy to retinal break once VH a little more stable - The incidence, risk factors, and natural history of retinal tear was  discussed with patient.   - Potential treatment options including laser retinopexy and cryotherapy discussed with patient. - monitor for now -- laser retinopexy once VH more stable  7. PVD / vitreous syneresis  Discussed findings and prognosis  No RT or RD on 360 scleral depressed exam  Reviewed s/s of RT/RD  Strict return precautions for any such RT/RD signs/symptoms  8. Pseudophakia OU  - s/p CE/IOL OU by Dr. Dolores Lory  - doing well  - monitor  9. No retinal edema on exam or OCT   Ophthalmic Meds Ordered this visit:  No orders of the defined types were placed in this encounter.      Return in about 1 week (around 04/29/2018) for f/u VH OD, DFE, OCT.  There are no Patient Instructions on file for this visit.   This document serves as a record of services personally performed by Gardiner Sleeper, MD, PhD. It was created on their behalf by Ernest Mallick, OA, an ophthalmic assistant. The creation of this record is the provider's dictation and/or activities during the visit.    Electronically signed by: Ernest Mallick, OA  01.23.2020 12:17 PM     Gardiner Sleeper, M.D., Ph.D. Diseases & Surgery of the Retina and Vitreous [facility]  I have reviewed the above documentation for accuracy and completeness, and I agree with the above. Gardiner Sleeper, M.D., Ph.D. 04/25/18 12:17 PM     Abbreviations: M myopia (nearsighted); A astigmatism; H hyperopia (farsighted); P presbyopia; Mrx spectacle prescription;  CTL contact lenses; OD right  eye; OS left eye; OU both eyes  XT exotropia; ET esotropia; PEK punctate epithelial keratitis; PEE punctate epithelial erosions; DES dry eye syndrome; MGD meibomian gland dysfunction; ATs artificial tears; PFAT's preservative free artificial tears; [place] nuclear sclerotic cataract; PSC posterior subcapsular cataract; ERM epi-retinal membrane; PVD posterior vitreous detachment; RD retinal detachment; DM diabetes mellitus; DR diabetic retinopathy; NPDR non-proliferative diabetic retinopathy; PDR proliferative diabetic retinopathy; CSME clinically significant macular edema; DME diabetic macular edema; dbh dot blot hemorrhages; CWS cotton wool spot; POAG primary open angle glaucoma; C/D cup-to-disc ratio; HVF humphrey visual field; GVF goldmann visual field; OCT optical coherence tomography; IOP intraocular pressure; BRVO Branch retinal vein occlusion; CRVO central retinal vein occlusion; CRAO central retinal artery occlusion; BRAO branch retinal artery occlusion; RT retinal tear; SB scleral buckle; PPV pars plana vitrectomy; VH Vitreous hemorrhage; PRP panretinal laser photocoagulation; IVK intravitreal kenalog; VMT vitreomacular traction; MH Macular hole;  NVD neovascularization of the disc; NVE neovascularization elsewhere; AREDS age related eye disease study; ARMD age related macular degeneration; POAG primary open angle glaucoma; EBMD epithelial/anterior basement membrane dystrophy; ACIOL anterior chamber intraocular lens; IOL intraocular lens; PCIOL posterior chamber intraocular lens; Phaco/IOL phacoemulsification with intraocular lens placement; [place] photorefractive keratectomy; LASIK laser assisted in situ keratomileusis; HTN hypertension; DM diabetes mellitus; COPD chronic obstructive pulmonary disease

## 2018-04-22 ENCOUNTER — Ambulatory Visit (INDEPENDENT_AMBULATORY_CARE_PROVIDER_SITE_OTHER): Payer: Medicare Other | Admitting: Ophthalmology

## 2018-04-22 ENCOUNTER — Encounter (INDEPENDENT_AMBULATORY_CARE_PROVIDER_SITE_OTHER): Payer: Self-pay | Admitting: Ophthalmology

## 2018-04-22 DIAGNOSIS — Z961 Presence of intraocular lens: Secondary | ICD-10-CM

## 2018-04-22 DIAGNOSIS — H43812 Vitreous degeneration, left eye: Secondary | ICD-10-CM

## 2018-04-22 DIAGNOSIS — H4311 Vitreous hemorrhage, right eye: Secondary | ICD-10-CM

## 2018-04-22 DIAGNOSIS — D682 Hereditary deficiency of other clotting factors: Secondary | ICD-10-CM

## 2018-04-22 DIAGNOSIS — H3581 Retinal edema: Secondary | ICD-10-CM

## 2018-04-22 DIAGNOSIS — I10 Essential (primary) hypertension: Secondary | ICD-10-CM

## 2018-04-22 DIAGNOSIS — H2101 Hyphema, right eye: Secondary | ICD-10-CM

## 2018-04-22 DIAGNOSIS — H33322 Round hole, left eye: Secondary | ICD-10-CM

## 2018-04-22 DIAGNOSIS — H35033 Hypertensive retinopathy, bilateral: Secondary | ICD-10-CM

## 2018-04-25 ENCOUNTER — Encounter (INDEPENDENT_AMBULATORY_CARE_PROVIDER_SITE_OTHER): Payer: Self-pay | Admitting: Ophthalmology

## 2018-04-25 NOTE — Progress Notes (Signed)
[facility] Note  04/27/2018     CHIEF COMPLAINT Patient presents for Retina Follow Up   HISTORY OF PRESENT ILLNESS: Nancy Hicks is a 85 y.o. female who presents to the clinic today for:   HPI    Retina Follow Up    Patient presents with  Other (Vitreous hemorrhage).  In right eye.  Severity is severe.  Duration of 5 days.  Since onset it is gradually improving.  I, the attending physician,  performed the HPI with the patient and updated documentation appropriately.          Comments    Patient states vision some better OD. Has pain OD that comes and goes.        Last edited by Bernarda Caffey, MD on 04/27/2018  8:23 AM. (History)    pt states she can see a little bit better, but her right eye is red, irritated and itches all the time, she states she thinks she may be allergic to one of the drops she is using, she states she is still sleeping with her head upright, she states her air bubble disappeared 1-2 days ago  Referring physician: Asencion Noble, MD [address] [place], [place] [address]  HISTORICAL INFORMATION:   Selected notes from the [facility]: Current Outpatient Medications (Ophthalmic Drugs)  Medication Sig  . atropine 1 % ophthalmic solution Place 1 drop into the right eye 2 (two) times daily.  . bacitracin-polymyxin b (POLYSPORIN) ophthalmic ointment Place 1 application into the right eye at bedtime.  Marland Kitchen gatifloxacin (ZYMAXID) 0.5 % SOLN Place 1 drop into the right eye 4 (four) times daily.  Vladimir Faster Glycol-Propyl Glycol (SYSTANE ULTRA PF) 0.4-0.3 % SOLN Place 2 drops into both eyes 3 (three) times daily.   . prednisoLONE acetate (PRED FORTE) 1 % ophthalmic suspension Place 1 drop into the right eye every hour while awake.  . brimonidine (ALPHAGAN) 0.2 % ophthalmic solution Place 1 drop into the right eye 2 (two) times daily. (Patient not taking: Reported on 04/27/2018)  . dorzolamide  (TRUSOPT) 2 % ophthalmic solution Place 1 drop into the right eye 2 (two) times daily. (Patient not taking: Reported on 04/27/2018)   No current facility-administered medications for this visit.  (Ophthalmic Drugs)   Current Outpatient Medications (Other)  Medication Sig  . ADVAIR DISKUS 100-50 MCG/DOSE AEPB take 1 tablet twice a day (Patient taking differently: Inhale 1 puff into the lungs 2 (two) times daily. )  . allopurinol (ZYLOPRIM) 300 MG tablet Take 300 mg by mouth daily.    . Biotin 5000 MCG CAPS Take 1 capsule by mouth daily.  Marland Kitchen CARTIA XT 300 MG 24 hr capsule Take 300 mg by mouth daily.   . chlorthalidone (HYGROTON) 25 MG tablet Take 25 mg by mouth daily.  . Cholecalciferol (VITAMIN D3) 2000 UNITS TABS Take 1 tablet by mouth daily.    Marland Kitchen esomeprazole (NEXIUM) 40 MG capsule Take 40 mg by mouth daily before breakfast.   . fluticasone (FLONASE) 50 MCG/ACT nasal spray instill 2 sprays into each nostril once daily (Patient taking differently: Place 2 sprays into both nostrils daily as needed for allergies or rhinitis. )  . losartan (COZAAR) 100 MG tablet Take 100 mg by mouth daily.   . methenamine (HIPREX) 1 G tablet Take 500 mg by mouth 2 (two) times daily with a meal.   . methylcellulose (CITRUCEL) oral powder Take 1 packet by  mouth 3 (three) times daily. Take 1 tsp by mouth daily   . Multiple Vitamin (MULITIVITAMIN WITH MINERALS) TABS Take 1 tablet by mouth daily.  . Multiple Vitamins-Minerals (OCUVITE-LUTEIN PO) Take 1 tablet by mouth daily.  . sertraline (ZOLOFT) 50 MG tablet Take 50 mg by mouth at bedtime.   . simvastatin (ZOCOR) 10 MG tablet Take 10 mg by mouth at bedtime.    . VENTOLIN HFA 108 (90 BASE) MCG/ACT inhaler inhale 2 puffs by mouth every 6 hours AS NEEDED FOR WHEEZING (Patient taking differently: Inhale 2 puffs into the lungs every 6 (six) hours as needed for wheezing or shortness of breath. )  . warfarin (COUMADIN) 2.5 MG tablet Take 2.5 mg by mouth every evening. Takes  2.5 mg everyday.   No current facility-administered medications for this visit.  (Other)      REVIEW OF SYSTEMS: ROS    Positive for: Gastrointestinal, Genitourinary, Cardiovascular, Eyes, Psychiatric   Negative for: Constitutional, Neurological, Skin, Musculoskeletal, HENT, Endocrine, Respiratory, Allergic/Imm, Heme/Lymph   Last edited by Roselee Nova D on 04/27/2018  8:03 AM. (History)       ALLERGIES Allergies  Allergen Reactions  . Metoclopramide Hcl Other (See Comments)    REACTION: causes uncontrolable leg movements  . Nitrofurantoin Nausea And Vomiting and Other (See Comments)    REACTION: dizziness    PAST MEDICAL HISTORY Past Medical History:  Diagnosis Date  . Anemia, unspecified   . Anxiety state, unspecified   . Blood dyscrasia    RV factor def  . Cancer ([place])    skin  . Degeneration of cervical intervertebral disc   . Disorder of bone and cartilage, unspecified   . Diverticulosis of colon (without mention of hemorrhage) 01/13/1999  . Esophageal reflux   . Esophageal stricture   . Factor V deficiency ([place])   . Factor V deficiency ([place])   . Hiatal hernia 04/09/2010   ? Paraesophageal component on chest CT 2006  . HTN (hypertension)   . Irritable bowel syndrome   . Neurogenic bladder   . Osteoarthrosis, unspecified whether generalized or localized, unspecified site   . Peripheral vascular disease (HCC)    pe, dvt hx  . Personal history of thrombophlebitis   . Personal history of venous thrombosis and embolism   . 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 Pure hypercholesterolemia   . Raynaud phenomenon   . Shortness of breath dyspnea   . Unspecified asthma(493.90)   . Unspecified disorder resulting from impaired renal function    cysts   Past Surgical History:  Procedure Laterality Date  . CATARACT EXTRACTION Bilateral   . CHOLECYSTECTOMY    . COLONOSCOPY  2000   Dr. Verl Blalock: moderate to severe sigmoid diverticulosis  .  COLONOSCOPY  03/2015   Dr. Peggye Form at [facility]: TCS for abnormal sigmoid colon on PET. Moderate diverticulosis of the sigmoid colon, sessile 8-9 mm sigmoid: Polyp biopsied (given anticoagulation), two 3 mm ICV polyps removed. All polyps were tubular adenomas.  . ESOPHAGEAL MANOMETRY  10/26/2011   Procedure: ESOPHAGEAL MANOMETRY (EM);  Surgeon: Sable Feil, MD;  Location: [facility];  Service: Endoscopy;  Laterality: N/A;  . ESOPHAGOGASTRODUODENOSCOPY  03/2010   Dr. Verl Blalock: candida esophagitis, prolapsing hh with cameron lesions, multiple polyps  . EYE SURGERY Bilateral    cataracts  . Flexible sigmoidoscopy  05/2015   Dr. Peggye [facility] at [facility]: Performed off anticoagulation. 1-1.5 cm sessile sigmoid polyp removed, site tattooed. Pathology revealed tubular adenoma. Colonoscopy  in 3-5 years as medically appropriate.  Marland Kitchen PAROTIDECTOMY Left 11/14/2014  . PAROTIDECTOMY Left 11/14/2014   Procedure: LEFT LATERAL PAROTIDECTOMY;  Surgeon: Leta Baptist, MD;  Location: [place];  Service: ENT;  Laterality: Left;  . PARS PLANA VITRECTOMY Right 04/15/2018   Procedure: PARS PLANA VITRECTOMY WITH 25 GAUGE;  Surgeon: Bernarda Caffey, MD;  Location: [place];  Service: Ophthalmology;  Laterality: Right;  . PHOTOCOAGULATION WITH LASER Right 04/15/2018   Procedure: PHOTOCOAGULATION WITH LASER;  Surgeon: Bernarda Caffey, MD;  Location: [place];  Service: Ophthalmology;  Laterality: Right;  . 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 TONSILLECTOMY      FAMILY HISTORY Family History  Problem Relation Age of Onset  . Parkinsonism Mother   . COPD Brother   . Asthma Brother   . Lung cancer Brother   . Heart attack Brother   . Diabetes Paternal Aunt     SOCIAL HISTORY Social History   Tobacco Use  . Smoking status: Never Smoker  . Smokeless tobacco: Never Used  Substance Use Topics  . Alcohol use: No  . Drug use: No         OPHTHALMIC EXAM:  Base Eye Exam    Visual Acuity (Snellen - Linear)      Right  Left   Dist cc 20/300 20/40 +2   Dist ph cc NI 20/25   Correction:  Glasses       Tonometry (Tonopen, 8:17 AM)      Right Left   Pressure 19 10       Pupils      Dark Light Shape React APD   Right 7 7 Round Minimal None   Left 5 4 Round Brisk None       Visual Fields      Left Right    Full    Restrictions  Total inferior temporal deficiency  Used HM OD, CF OS       Extraocular Movement      Right Left    Full, Ortho Full, Ortho       Neuro/Psych    Oriented x3:  Yes   Mood/Affect:  Normal       Dilation    Both eyes:  1.0% Mydriacyl, 2.5% Phenylephrine @ 8:17 AM        Slit Lamp and Fundus Exam    Slit Lamp Exam      Right Left   Lids/Lashes 1+ erythema and edema dermato   Conjunctiva/Sclera 2+ Injection, sutures intact White and quiet   Cornea arcus; 2-3+ PEE greatest inf, tear film debris arcus; 2+ PEE   Anterior Chamber Deep; 3+ RBC/pigment, +iridodonesis Deep and quiet   Iris round, poorly dilated to 4.5; + iridodonesis Round and reactive   Lens PCIOL PCIOL   Vitreous post vitrectomy; +RBC, air bubble gone, diffuse VH mildly improved syneresis, Posterior vitreous detachment       Fundus Exam      Right Left   Disc hazy view, perfused    C/D Ratio  0.1   Macula hazy view; grossly attached    Vessels attenuated; hazy view    Periphery hazy view; grossly attached         Refraction    Wearing Rx      Sphere Cylinder Axis Add   Right +0.50 +1.00 017 +2.50   Left -0.25 +0.50 170 +2.50   Type:  PAL          IMAGING AND PROCEDURES  Imaging and Procedures for 04/03/18  OCT, Retina - OU - Both Eyes       Right Eye Quality was poor. Central Foveal Thickness: 329 (No image obtained). Progression has improved. Findings include normal foveal contour, epiretinal membrane, no IRF, no SRF (Diffuse vitreous opacities).   Left Eye Quality was good. Central Foveal Thickness: 281. Progression has been stable. Findings include normal foveal contour,  no IRF, no SRF (Tr ERM).   Notes *Images captured and stored on drive  Diagnosis / Impression:  OU: NFP; no IRF/SRF; tr ERM    Clinical management:  See below  Abbreviations: NFP - Normal foveal profile. CME - cystoid macular edema. PED - pigment epithelial detachment. IRF - intraretinal fluid. SRF - subretinal fluid. EZ - ellipsoid zone. ERM - epiretinal membrane. ORA - outer retinal atrophy. ORT - outer retinal tubulation. SRHM - subretinal hyper-reflective material                 ASSESSMENT/PLAN:    ICD-10-CM   1. Vitreous hemorrhage, right eye (HCC) H43.11   2. Hyphema of right eye H21.01   3. Essential hypertension I10   4. Hypertensive retinopathy of both eyes H35.033   5. Factor V deficiency ([place]) D68.2   6. Retinal hole, left H33.322   7. Posterior vitreous detachment of left eye H43.812   8. Pseudophakia of both eyes Z96.1   9. Retinal edema H35.81 OCT, Retina - OU - Both Eyes    1,2. Vitreous hemorrhage with hyphema OD - acute onset on Friday evening (04/01/2018)  ~0600 pm -- no pain or photopsias - BCVA at initial presentation 20/80 OD -- progressed over the week - pt with history of Factor V Leiden deficiency -- on coumadin - also has history of HTN which was recently uncontrolled due to medication changes - also reports recent violent coughing spells - now POW1+ s/pPPV/EL/FAX + AC washout OD, 1.17.20             - intra-op -- dense heme in vit and AC -- two retinal tears noted intra-op and lasered             - today, vision and VH improving--recurrent diffuse VH with spillover into AC             - iridodonesis quite prevalent -- IOL may be rubbing iris and causing heme -- may need UBM to look at IOL position             - air bubble gone - IOP 19 mmHg - cont PF 6x/day OD Atropine BID OD-- switch to tropicamide 1% BID -- allergic reaction to atropine PSO ung QHS prn OD  -  start brimonindine BID OD - keep head upright - eye shield when sleeping/resting - post op drop and positioning instructions reviewed             - VH precautions reviewed -- minimize activities, keep head elevated, avoid ASA/NSAIDs/blood thinners as able - f/u 1 week  2,3. Hypertensive retinopathy OU - discussed importance of tight BP control - monitor  4. Factor V Leiden - history of DVT and PE - currently on coumadin, 2.5 mg daily - pre-op INR was 1.65 - spoke with Dr. Willey Blade preoperatively who stated it is okay to hold coumadin perioperatively - pt restarted Coumadin 01.19.20  5. Operculated retinal hole OS - pigmented operculated hole without SRF at 0130 -- appears chronic and stable but recommend laser retinopexy to retinal break once VH a little  more stable - The incidence, risk factors, and natural history of retinal tear was discussed with patient.   - Potential treatment options including laser retinopexy and cryotherapy discussed with patient. - monitor for now -- laser retinopexy once VH more stable  6. PVD / vitreous syneresis  Discussed findings and prognosis  No RT or RD on 360 scleral depressed exam  Reviewed s/s of RT/RD  Strict return precautions for any such RT/RD signs/symptoms  7. Pseudophakia OU  - s/p CE/IOL OU by Dr. Dolores Lory  - doing well  - monitor  8. No retinal edema on exam or OCT   Ophthalmic Meds Ordered this visit:  Meds ordered this encounter  Medications  . bacitracin-polymyxin b (POLYSPORIN) ophthalmic ointment    Sig: Place 1 application into the right eye at bedtime.    Dispense:  3.5 g    Refill:  3       Return in about 1 week (around 05/04/2018) for f/u VH OD, DFE, OCT.  There are no Patient Instructions on file for this visit.   Explained the diagnoses, plan, and follow up with the patient and they expressed understanding.  Patient expressed understanding of the importance of proper follow up  care.   This document serves as a record of services personally performed by Gardiner Sleeper, MD, PhD. It was created on their behalf by Ernest Mallick, OA, an ophthalmic assistant. The creation of this record is the provider's dictation and/or activities during the visit.    Electronically signed by: Ernest Mallick, OA  01.27.2020 1:05 PM     Gardiner Sleeper, M.D., Ph.D. Diseases & Surgery of the Retina and Vitreous [facility]  I have reviewed the above documentation for accuracy and completeness, and I agree with the above. Gardiner Sleeper, M.D., Ph.D. 04/27/18 1:12 PM    Abbreviations: M myopia (nearsighted); A astigmatism; H hyperopia (farsighted); P presbyopia; Mrx spectacle prescription;  CTL contact lenses; OD right eye; OS left eye; OU both eyes  XT exotropia; ET esotropia; PEK punctate epithelial keratitis; PEE punctate epithelial erosions; DES dry eye syndrome; MGD meibomian gland dysfunction; ATs artificial tears; PFAT's preservative free artificial tears; [place] nuclear sclerotic cataract; PSC posterior subcapsular cataract; ERM epi-retinal membrane; PVD posterior vitreous detachment; RD retinal detachment; DM diabetes mellitus; DR diabetic retinopathy; NPDR non-proliferative diabetic retinopathy; PDR proliferative diabetic retinopathy; CSME clinically significant macular edema; DME diabetic macular edema; dbh dot blot hemorrhages; CWS cotton wool spot; POAG primary open angle glaucoma; C/D cup-to-disc ratio; HVF humphrey visual field; GVF goldmann visual field; OCT optical coherence tomography; IOP intraocular pressure; BRVO Branch retinal vein occlusion; CRVO central retinal vein occlusion; CRAO central retinal artery occlusion; BRAO branch retinal artery occlusion; RT retinal tear; SB scleral buckle; PPV pars plana vitrectomy; VH Vitreous hemorrhage; PRP panretinal laser photocoagulation; IVK intravitreal kenalog; VMT vitreomacular traction; MH Macular hole;  NVD  neovascularization of the disc; NVE neovascularization elsewhere; AREDS age related eye disease study; ARMD age related macular degeneration; POAG primary open angle glaucoma; EBMD epithelial/anterior basement membrane dystrophy; ACIOL anterior chamber intraocular lens; IOL intraocular lens; PCIOL posterior chamber intraocular lens; Phaco/IOL phacoemulsification with intraocular lens placement; [place] photorefractive keratectomy; LASIK laser assisted in situ keratomileusis; HTN hypertension; DM diabetes mellitus; COPD chronic obstructive pulmonary disease

## 2018-04-27 ENCOUNTER — Ambulatory Visit (INDEPENDENT_AMBULATORY_CARE_PROVIDER_SITE_OTHER): Payer: Medicare Other | Admitting: Ophthalmology

## 2018-04-27 ENCOUNTER — Encounter (INDEPENDENT_AMBULATORY_CARE_PROVIDER_SITE_OTHER): Payer: Self-pay | Admitting: Ophthalmology

## 2018-04-27 DIAGNOSIS — H35033 Hypertensive retinopathy, bilateral: Secondary | ICD-10-CM

## 2018-04-27 DIAGNOSIS — H33322 Round hole, left eye: Secondary | ICD-10-CM

## 2018-04-27 DIAGNOSIS — Z961 Presence of intraocular lens: Secondary | ICD-10-CM

## 2018-04-27 DIAGNOSIS — H2101 Hyphema, right eye: Secondary | ICD-10-CM

## 2018-04-27 DIAGNOSIS — H3581 Retinal edema: Secondary | ICD-10-CM | POA: Diagnosis not present

## 2018-04-27 DIAGNOSIS — H43812 Vitreous degeneration, left eye: Secondary | ICD-10-CM

## 2018-04-27 DIAGNOSIS — D682 Hereditary deficiency of other clotting factors: Secondary | ICD-10-CM

## 2018-04-27 DIAGNOSIS — I10 Essential (primary) hypertension: Secondary | ICD-10-CM

## 2018-04-27 DIAGNOSIS — H4311 Vitreous hemorrhage, right eye: Secondary | ICD-10-CM

## 2018-04-27 MED ORDER — BACITRACIN-POLYMYXIN B 500-10000 UNIT/GM OP OINT: 1.0000 "application " | TOPICAL_OINTMENT | Freq: Every day | OPHTHALMIC | 3 refills | Status: DC

## 2018-04-28 ENCOUNTER — Other Ambulatory Visit (INDEPENDENT_AMBULATORY_CARE_PROVIDER_SITE_OTHER): Payer: Self-pay

## 2018-04-28 ENCOUNTER — Telehealth (INDEPENDENT_AMBULATORY_CARE_PROVIDER_SITE_OTHER): Payer: Self-pay

## 2018-04-28 MED ORDER — ERYTHROMYCIN 5 MG/GM OP OINT: 1.0000 "application " | TOPICAL_OINTMENT | Freq: Every day | OPHTHALMIC | 3 refills | Status: DC

## 2018-05-02 NOTE — Progress Notes (Signed)
[facility] Note  05/04/2018     CHIEF COMPLAINT Patient presents for Post-op Follow-up   HISTORY OF PRESENT ILLNESS: Nancy Hicks is a 85 y.o. female who presents to the clinic today for:   HPI    Post-op Follow-up    In right eye.  Discomfort includes pain, itching, tearing and floaters.  Negative for foreign body sensation and discharge.  Vision is improved.  I, the attending physician,  performed the HPI with the patient and updated documentation appropriately.          Comments    POV # 4. Patient states her vision 'is gotten better", she has occasional itching, achy pain. Floaters have decreased per patient. Pt is using PF x 6 qd, Atropine and Brimonidine Bid OD.        Last edited by Bernarda Caffey, MD on 05/04/2018  9:40 AM. (History)    pt states vision is much improved, she states she noticed over the past couple of days that it was getting better, she states she has not changed anything with her blood thinner Referring physician: Asencion Noble, MD [address] [place], [place] [address]  HISTORICAL INFORMATION:   Selected notes from the [facility]: Current Outpatient Medications (Ophthalmic Drugs)  Medication Sig  . atropine 1 % ophthalmic solution Place 1 drop into the right eye 2 (two) times daily.  . bacitracin-polymyxin b (POLYSPORIN) ophthalmic ointment Place 1 application into the right eye at bedtime.  . brimonidine (ALPHAGAN) 0.2 % ophthalmic solution Place 1 drop into the right eye 2 (two) times daily.  . dorzolamide (TRUSOPT) 2 % ophthalmic solution Place 1 drop into the right eye 2 (two) times daily.  Marland Kitchen erythromycin ophthalmic ointment Place 1 application into the right eye at bedtime.  Marland Kitchen gatifloxacin (ZYMAXID) 0.5 % SOLN Place 1 drop into the right eye 4 (four) times daily.  Vladimir Faster Glycol-Propyl Glycol (SYSTANE ULTRA PF) 0.4-0.3 % SOLN Place 2 drops into both eyes 3 (three)  times daily.   . prednisoLONE acetate (PRED FORTE) 1 % ophthalmic suspension Place 1 drop into the right eye every hour while awake.   No current facility-administered medications for this visit.  (Ophthalmic Drugs)   Current Outpatient Medications (Other)  Medication Sig  . ADVAIR DISKUS 100-50 MCG/DOSE AEPB take 1 tablet twice a day (Patient taking differently: Inhale 1 puff into the lungs 2 (two) times daily. )  . allopurinol (ZYLOPRIM) 300 MG tablet Take 300 mg by mouth daily.    . Biotin 5000 MCG CAPS Take 1 capsule by mouth daily.  Marland Kitchen CARTIA XT 300 MG 24 hr capsule Take 300 mg by mouth daily.   . chlorthalidone (HYGROTON) 25 MG tablet Take 25 mg by mouth daily.  . Cholecalciferol (VITAMIN D3) 2000 UNITS TABS Take 1 tablet by mouth daily.    Marland Kitchen esomeprazole (NEXIUM) 40 MG capsule Take 40 mg by mouth daily before breakfast.   . fluticasone (FLONASE) 50 MCG/ACT nasal spray instill 2 sprays into each nostril once daily (Patient taking differently: Place 2 sprays into both nostrils daily as needed for allergies or rhinitis. )  . losartan (COZAAR) 100 MG tablet Take 100 mg by mouth daily.   . methenamine (HIPREX) 1 G tablet Take 500 mg by mouth 2 (two) times daily with a meal.   . methylcellulose (CITRUCEL) oral powder Take 1 packet by mouth 3 (three) times daily. Take 1 tsp by  mouth daily   . Multiple Vitamin (MULITIVITAMIN WITH MINERALS) TABS Take 1 tablet by mouth daily.  . Multiple Vitamins-Minerals (OCUVITE-LUTEIN PO) Take 1 tablet by mouth daily.  . sertraline (ZOLOFT) 50 MG tablet Take 50 mg by mouth at bedtime.   . simvastatin (ZOCOR) 10 MG tablet Take 10 mg by mouth at bedtime.    . VENTOLIN HFA 108 (90 BASE) MCG/ACT inhaler inhale 2 puffs by mouth every 6 hours AS NEEDED FOR WHEEZING (Patient taking differently: Inhale 2 puffs into the lungs every 6 (six) hours as needed for wheezing or shortness of breath. )  . warfarin (COUMADIN) 2.5 MG tablet Take 2.5 mg by mouth every evening.  Takes 2.5 mg everyday.   No current facility-administered medications for this visit.  (Other)      REVIEW OF SYSTEMS: ROS    Positive for: Eyes   Negative for: Constitutional, Gastrointestinal, Neurological, Skin, Genitourinary, Musculoskeletal, HENT, Endocrine, Cardiovascular, Respiratory, Psychiatric, Allergic/Imm, Heme/Lymph   Last edited by Zenovia Jordan, LPN on 05/06/3783  8:85 AM. (History)       ALLERGIES Allergies  Allergen Reactions  . Metoclopramide Hcl Other (See Comments)    REACTION: causes uncontrolable leg movements  . Nitrofurantoin Nausea And Vomiting and Other (See Comments)    REACTION: dizziness    PAST MEDICAL HISTORY Past Medical History:  Diagnosis Date  . Anemia, unspecified   . Anxiety state, unspecified   . Blood dyscrasia    RV factor def  . Cancer ([place])    skin  . Degeneration of cervical intervertebral disc   . Disorder of bone and cartilage, unspecified   . Diverticulosis of colon (without mention of hemorrhage) 01/13/1999  . Esophageal reflux   . Esophageal stricture   . Factor V deficiency ([place])   . Factor V deficiency ([place])   . Hiatal hernia 04/09/2010   ? Paraesophageal component on chest CT 2006  . HTN (hypertension)   . Irritable bowel syndrome   . Neurogenic bladder   . Osteoarthrosis, unspecified whether generalized or localized, unspecified site   . Peripheral vascular disease (HCC)    pe, dvt hx  . Personal history of thrombophlebitis   . Personal history of venous thrombosis and embolism   . 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 Pure hypercholesterolemia   . Raynaud phenomenon   . Shortness of breath dyspnea   . Unspecified asthma(493.90)   . Unspecified disorder resulting from impaired renal function    cysts   Past Surgical History:  Procedure Laterality Date  . CATARACT EXTRACTION Bilateral   . CHOLECYSTECTOMY    . COLONOSCOPY  2000   Dr. Verl Blalock: moderate to severe sigmoid diverticulosis   . COLONOSCOPY  03/2015   Dr. Peggye Form at [facility]: TCS for abnormal sigmoid colon on PET. Moderate diverticulosis of the sigmoid colon, sessile 8-9 mm sigmoid: Polyp biopsied (given anticoagulation), two 3 mm ICV polyps removed. All polyps were tubular adenomas.  . ESOPHAGEAL MANOMETRY  10/26/2011   Procedure: ESOPHAGEAL MANOMETRY (EM);  Surgeon: Sable Feil, MD;  Location: [facility];  Service: Endoscopy;  Laterality: N/A;  . ESOPHAGOGASTRODUODENOSCOPY  03/2010   Dr. Verl Blalock: candida esophagitis, prolapsing hh with cameron lesions, multiple polyps  . EYE SURGERY Bilateral    cataracts  . Flexible sigmoidoscopy  05/2015   Dr. Peggye [facility] at [facility]: Performed off anticoagulation. 1-1.5 cm sessile sigmoid polyp removed, site tattooed. Pathology revealed tubular adenoma. Colonoscopy in 3-5 years as medically appropriate.  Marland Kitchen PAROTIDECTOMY  Left 11/14/2014  . PAROTIDECTOMY Left 11/14/2014   Procedure: LEFT LATERAL PAROTIDECTOMY;  Surgeon: Leta Baptist, MD;  Location: [facility];  Service: ENT;  Laterality: Left;  . PARS PLANA VITRECTOMY Right 04/15/2018   Procedure: PARS PLANA VITRECTOMY WITH 25 GAUGE;  Surgeon: Bernarda Caffey, MD;  Location: [facility];  Service: Ophthalmology;  Laterality: Right;  . PHOTOCOAGULATION WITH LASER Right 04/15/2018   Procedure: PHOTOCOAGULATION WITH LASER;  Surgeon: Bernarda Caffey, MD;  Location: [facility];  Service: Ophthalmology;  Laterality: Right;  . 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 TONSILLECTOMY      FAMILY HISTORY Family History  Problem Relation Age of Onset  . Parkinsonism Mother   . COPD Brother   . Asthma Brother   . Lung cancer Brother   . Heart attack Brother   . Diabetes Paternal Aunt     SOCIAL HISTORY Social History   Tobacco Use  . Smoking status: Never Smoker  . Smokeless tobacco: Never Used  Substance Use Topics  . Alcohol use: No  . Drug use: No         OPHTHALMIC EXAM:  Base Eye Exam    Visual Acuity (Snellen - Linear)       Right Left   Dist cc 20/50 +1 20/40   Dist ph cc 20/40 -2 NI   Correction:  Glasses       Tonometry (Tonopen, 9:10 AM)      Right Left   Pressure 12 10       Pupils      Dark Light Shape React APD   Right 4 3 Round Brisk None   Left 3 2 Round Brisk None       Visual Fields      Left Right    Full Full       Extraocular Movement      Right Left    Full, Ortho Full, Ortho       Neuro/Psych    Oriented x3:  Yes   Mood/Affect:  Normal       Dilation    Both eyes:  1.0% Mydriacyl, 2.5% Phenylephrine @ 9:05 AM        Slit Lamp and Fundus Exam    Slit Lamp Exam      Right Left   Lids/Lashes 1+ erythema and edema dermato   Conjunctiva/Sclera White and quiet, sutures intact White and quiet   Cornea arcus; 2+ PEE greatest inf, tear film debris arcus; 2+ PEE   Anterior Chamber Deep; 2+ RBC/pigment, no hyphema Deep and quiet   Iris round, poorly dilated to 4.5; + iridodonesis; peripheral TID at 0230 Round and reactive   Lens PCIOL PCIOL   Vitreous post vitrectomy; , mild residual RBC syneresis, Posterior vitreous detachment       Fundus Exam      Right Left   Disc sharp rim, mild elavation, ?disc drusen, PPP Normal   C/D Ratio  0.1   Macula Flat, Blunted foveal reflex, Drusen, Retinal pigment epithelial mottling, No heme or edema flat; blunted foveal reflex; mild RPE mottling   Vessels attenuated attenuated   Periphery Attached    attached; pigmented operculated hole at 0130; scattered reticular degeneration; stable from prior          IMAGING AND PROCEDURES  Imaging and Procedures for 04/03/18  OCT, Retina - OU - Both Eyes       Right Eye Quality was good. Central Foveal Thickness: 372 (No image obtained). Progression  has improved. Findings include normal foveal contour, epiretinal membrane, no SRF, intraretinal fluid (mild vitreous opacities).   Left Eye Quality was good. Central Foveal Thickness: 282. Progression has been stable. Findings include normal  foveal contour, no IRF, no SRF (Tr ERM).   Notes *Images captured and stored on drive  Diagnosis / Impression:  OU: NFP; no IRF/SRF; tr ERM    Clinical management:  See below  Abbreviations: NFP - Normal foveal profile. CME - cystoid macular edema. PED - pigment epithelial detachment. IRF - intraretinal fluid. SRF - subretinal fluid. EZ - ellipsoid zone. ERM - epiretinal membrane. ORA - outer retinal atrophy. ORT - outer retinal tubulation. SRHM - subretinal hyper-reflective material                 ASSESSMENT/PLAN:    ICD-10-CM   1. Vitreous hemorrhage, right eye (HCC) H43.11   2. Hyphema of right eye H21.01   3. Essential hypertension I10   4. Hypertensive retinopathy of both eyes H35.033   5. Factor V deficiency ([place]) D68.2   6. Retinal hole, left H33.322   7. Posterior vitreous detachment of left eye H43.812   8. Pseudophakia of both eyes Z96.1   9. Retinal edema H35.81 OCT, Retina - OU - Both Eyes    1,2. Vitreous hemorrhage with hyphema OD - acute onset on Friday evening (04/01/2018)  ~0600 pm -- no pain or photopsias - BCVA at initial presentation 20/80 OD -- progressed over the week - pt with history of Factor V Leiden deficiency -- on coumadin - also has history of HTN which was recently uncontrolled due to medication changes - also reports recent violent coughing spells - now s/pPPV/EL/FAX + AC washout OD, 1.17.20             - intra-op -- dense heme in vit and AC -- two retinal tears noted intra-op and lasered             - today, vision and VH improving-- interval clearing of VH and microhyphema -- able to see posterior pole today             - iridodonesis quite prevalent -- IOL may be rubbing iris and causing heme -- may need UBM to look at IOL position - IOP 12 mmHg - cont PF 4x/day OD okay to stop tropicamide 1% BID -- allergic reaction to atropine PSO ung QHS prn OD  -  brimonindine BID OD -- okay to stop - keep head upright - eye shield when sleeping/resting - post op drop and positioning instructions reviewed             - VH precautions reviewed -- minimize activities, keep head elevated, avoid ASA/NSAIDs/blood thinners as able - f/u 2 week  2,3. Hypertensive retinopathy OU - discussed importance of tight BP control - monitor  4. Factor V Leiden - history of DVT and PE - currently on coumadin, 2.5 mg daily - pre-op INR was 1.65 - spoke with Dr. Willey Blade preoperatively who stated it is okay to hold coumadin perioperatively - pt restarted Coumadin 01.19.20  5. Operculated retinal hole OS - pigmented operculated hole without SRF at 0130 -- appears chronic and stable but recommend laser retinopexy to retinal break once VH a little more stable - The incidence, risk factors, and natural history of retinal tear was discussed with patient.   - Potential treatment options including laser retinopexy and cryotherapy discussed with patient. - monitor for now -- laser retinopexy once VH more  stable  6. PVD / vitreous syneresis  Discussed findings and prognosis  No RT or RD on 360 scleral depressed exam  Reviewed s/s of RT/RD  Strict return precautions for any such RT/RD signs/symptoms  7. Pseudophakia OU  - s/p CE/IOL OU by Dr. Dolores Lory  - doing well  - monitor  8. No retinal edema on exam or OCT   Ophthalmic Meds Ordered this visit:  No orders of the defined types were placed in this encounter.      Return in about 2 weeks (around 05/18/2018) for f/u VH OD, DFE, OCT.  There are no Patient Instructions on file for this visit.   Explained the diagnoses, plan, and follow up with the patient and they expressed understanding.  Patient expressed understanding of the importance of proper follow up care.   This document serves as a record of services personally performed by Gardiner Sleeper, MD, PhD. It was created on  their behalf by Ernest Mallick, OA, an ophthalmic assistant. The creation of this record is the provider's dictation and/or activities during the visit.    Electronically signed by: Ernest Mallick, OA  02.03.2020 1:48 PM    Gardiner Sleeper, M.D., Ph.D. Diseases & Surgery of the Retina and Vitreous [facility]  I have reviewed the above documentation for accuracy and completeness, and I agree with the above. Gardiner Sleeper, M.D., Ph.D. 05/06/18 1:53 PM     Abbreviations: M myopia (nearsighted); A astigmatism; H hyperopia (farsighted); P presbyopia; Mrx spectacle prescription;  CTL contact lenses; OD right eye; OS left eye; OU both eyes  XT exotropia; ET esotropia; PEK punctate epithelial keratitis; PEE punctate epithelial erosions; DES dry eye syndrome; MGD meibomian gland dysfunction; ATs artificial tears; PFAT's preservative free artificial tears; [place] nuclear sclerotic cataract; PSC posterior subcapsular cataract; ERM epi-retinal membrane; PVD posterior vitreous detachment; RD retinal detachment; DM diabetes mellitus; DR diabetic retinopathy; NPDR non-proliferative diabetic retinopathy; PDR proliferative diabetic retinopathy; CSME clinically significant macular edema; DME diabetic macular edema; dbh dot blot hemorrhages; CWS cotton wool spot; POAG primary open angle glaucoma; C/D cup-to-disc ratio; HVF humphrey visual field; GVF goldmann visual field; OCT optical coherence tomography; IOP intraocular pressure; BRVO Branch retinal vein occlusion; CRVO central retinal vein occlusion; CRAO central retinal artery occlusion; BRAO branch retinal artery occlusion; RT retinal tear; SB scleral buckle; PPV pars plana vitrectomy; VH Vitreous hemorrhage; PRP panretinal laser photocoagulation; IVK intravitreal kenalog; VMT vitreomacular traction; MH Macular hole;  NVD neovascularization of the disc; NVE neovascularization elsewhere; AREDS age related eye disease study; ARMD age related macular  degeneration; POAG primary open angle glaucoma; EBMD epithelial/anterior basement membrane dystrophy; ACIOL anterior chamber intraocular lens; IOL intraocular lens; PCIOL posterior chamber intraocular lens; Phaco/IOL phacoemulsification with intraocular lens placement; [place] photorefractive keratectomy; LASIK laser assisted in situ keratomileusis; HTN hypertension; DM diabetes mellitus; COPD chronic obstructive pulmonary disease

## 2018-05-04 ENCOUNTER — Encounter (INDEPENDENT_AMBULATORY_CARE_PROVIDER_SITE_OTHER): Payer: Self-pay | Admitting: Ophthalmology

## 2018-05-04 ENCOUNTER — Ambulatory Visit (INDEPENDENT_AMBULATORY_CARE_PROVIDER_SITE_OTHER): Payer: Medicare Other | Admitting: Ophthalmology

## 2018-05-04 DIAGNOSIS — H3581 Retinal edema: Secondary | ICD-10-CM | POA: Diagnosis not present

## 2018-05-04 DIAGNOSIS — H4311 Vitreous hemorrhage, right eye: Secondary | ICD-10-CM

## 2018-05-04 DIAGNOSIS — Z961 Presence of intraocular lens: Secondary | ICD-10-CM

## 2018-05-04 DIAGNOSIS — H35033 Hypertensive retinopathy, bilateral: Secondary | ICD-10-CM

## 2018-05-04 DIAGNOSIS — H33322 Round hole, left eye: Secondary | ICD-10-CM

## 2018-05-04 DIAGNOSIS — H43812 Vitreous degeneration, left eye: Secondary | ICD-10-CM

## 2018-05-04 DIAGNOSIS — D682 Hereditary deficiency of other clotting factors: Secondary | ICD-10-CM

## 2018-05-04 DIAGNOSIS — H2101 Hyphema, right eye: Secondary | ICD-10-CM

## 2018-05-04 DIAGNOSIS — I10 Essential (primary) hypertension: Secondary | ICD-10-CM

## 2018-05-06 ENCOUNTER — Encounter (INDEPENDENT_AMBULATORY_CARE_PROVIDER_SITE_OTHER): Payer: Self-pay | Admitting: Ophthalmology

## 2018-05-17 NOTE — Progress Notes (Addendum)
[facility] Note  05/18/2018     CHIEF COMPLAINT Patient presents for Post-op Follow-up   HISTORY OF PRESENT ILLNESS: Nancy Hicks is a 85 y.o. female who presents to the clinic today for:   HPI    Post-op Follow-up    In right eye.  Discomfort includes floaters.  Negative for pain, itching, foreign body sensation, tearing and discharge.  Vision is improved.  I, the attending physician,  performed the HPI with the patient and updated documentation appropriately.          Comments    POV #5   VH OD 04/15/18. Patient states "last week she became concerned because her vision had  increased blurriness" ,after couple of days her vision return to normal. Denies flashes and ocular pain. Pt is using PF gtt's       Last edited by Bernarda Caffey, MD on 05/19/2018  8:17 AM. (History)    pt states a couple weeks ago her vision become very blurry, she states there was no pain associated with the event and it eventually cleared on its own   Referring physician: Asencion Noble, MD [address] [place], [place] [address]  HISTORICAL INFORMATION:   Selected notes from the [facility]: Current Outpatient Medications (Ophthalmic Drugs)  Medication Sig  . atropine 1 % ophthalmic solution Place 1 drop into the right eye 2 (two) times daily.  . bacitracin-polymyxin b (POLYSPORIN) ophthalmic ointment Place 1 application into the right eye at bedtime.  . brimonidine (ALPHAGAN) 0.2 % ophthalmic solution Place 1 drop into the right eye 2 (two) times daily.  . dorzolamide (TRUSOPT) 2 % ophthalmic solution Place 1 drop into the right eye 2 (two) times daily.  Marland Kitchen erythromycin ophthalmic ointment Place 1 application into the right eye at bedtime.  Marland Kitchen gatifloxacin (ZYMAXID) 0.5 % SOLN Place 1 drop into the right eye 4 (four) times daily.  Vladimir Faster Glycol-Propyl Glycol (SYSTANE ULTRA PF) 0.4-0.3 % SOLN Place 2 drops into both eyes 3  (three) times daily.   . prednisoLONE acetate (PRED FORTE) 1 % ophthalmic suspension Place 1 drop into the right eye every hour while awake.   No current facility-administered medications for this visit.  (Ophthalmic Drugs)   Current Outpatient Medications (Other)  Medication Sig  . ADVAIR DISKUS 100-50 MCG/DOSE AEPB take 1 tablet twice a day (Patient taking differently: Inhale 1 puff into the lungs 2 (two) times daily. )  . allopurinol (ZYLOPRIM) 300 MG tablet Take 300 mg by mouth daily.    . Biotin 5000 MCG CAPS Take 1 capsule by mouth daily.  Marland Kitchen CARTIA XT 300 MG 24 hr capsule Take 300 mg by mouth daily.   . chlorthalidone (HYGROTON) 25 MG tablet Take 25 mg by mouth daily.  . Cholecalciferol (VITAMIN D3) 2000 UNITS TABS Take 1 tablet by mouth daily.    Marland Kitchen esomeprazole (NEXIUM) 40 MG capsule Take 40 mg by mouth daily before breakfast.   . fluticasone (FLONASE) 50 MCG/ACT nasal spray instill 2 sprays into each nostril once daily (Patient taking differently: Place 2 sprays into both nostrils daily as needed for allergies or rhinitis. )  . losartan (COZAAR) 100 MG tablet Take 100 mg by mouth daily.   . methenamine (HIPREX) 1 G tablet Take 500 mg by mouth 2 (two) times daily with a meal.   . methylcellulose (CITRUCEL) oral powder Take 1 packet by mouth 3 (three) times daily. Take  1 tsp by mouth daily   . Multiple Vitamin (MULITIVITAMIN WITH MINERALS) TABS Take 1 tablet by mouth daily.  . Multiple Vitamins-Minerals (OCUVITE-LUTEIN PO) Take 1 tablet by mouth daily.  . sertraline (ZOLOFT) 50 MG tablet Take 50 mg by mouth at bedtime.   . simvastatin (ZOCOR) 10 MG tablet Take 10 mg by mouth at bedtime.    . VENTOLIN HFA 108 (90 BASE) MCG/ACT inhaler inhale 2 puffs by mouth every 6 hours AS NEEDED FOR WHEEZING (Patient taking differently: Inhale 2 puffs into the lungs every 6 (six) hours as needed for wheezing or shortness of breath. )  . warfarin (COUMADIN) 2.5 MG tablet Take 2.5 mg by mouth every  evening. Takes 2.5 mg everyday.   No current facility-administered medications for this visit.  (Other)      REVIEW OF SYSTEMS: ROS    Positive for: Eyes   Negative for: Constitutional, Gastrointestinal, Neurological, Skin, Genitourinary, Musculoskeletal, HENT, Endocrine, Cardiovascular, Respiratory, Psychiatric, Allergic/Imm, Heme/Lymph   Last edited by Zenovia Jordan, LPN on 8/91/6945  0:38 PM. (History)       ALLERGIES Allergies  Allergen Reactions  . Metoclopramide Hcl Other (See Comments)    REACTION: causes uncontrolable leg movements  . Nitrofurantoin Nausea And Vomiting and Other (See Comments)    REACTION: dizziness    PAST MEDICAL HISTORY Past Medical History:  Diagnosis Date  . Anemia, unspecified   . Anxiety state, unspecified   . Blood dyscrasia    RV factor def  . Cancer ([place])    skin  . Degeneration of cervical intervertebral disc   . Disorder of bone and cartilage, unspecified   . Diverticulosis of colon (without mention of hemorrhage) 01/13/1999  . Esophageal reflux   . Esophageal stricture   . Factor V deficiency ([place])   . Factor V deficiency ([place])   . Hiatal hernia 04/09/2010   ? Paraesophageal component on chest CT 2006  . HTN (hypertension)   . Irritable bowel syndrome   . Neurogenic bladder   . Osteoarthrosis, unspecified whether generalized or localized, unspecified site   . Peripheral vascular disease (HCC)    pe, dvt hx  . Personal history of thrombophlebitis   . Personal history of venous thrombosis and embolism   . 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 Pure hypercholesterolemia   . Raynaud phenomenon   . Shortness of breath dyspnea   . Unspecified asthma(493.90)   . Unspecified disorder resulting from impaired renal function    cysts   Past Surgical History:  Procedure Laterality Date  . CATARACT EXTRACTION Bilateral   . CHOLECYSTECTOMY    . COLONOSCOPY  2000   Dr. Verl Blalock: moderate to severe sigmoid  diverticulosis  . COLONOSCOPY  03/2015   Dr. Peggye [facility] at [facility]: TCS for abnormal sigmoid colon on PET. Moderate diverticulosis of the sigmoid colon, sessile 8-9 mm sigmoid: Polyp biopsied (given anticoagulation), two 3 mm ICV polyps removed. All polyps were tubular adenomas.  . ESOPHAGEAL MANOMETRY  10/26/2011   Procedure: ESOPHAGEAL MANOMETRY (EM);  Surgeon: Sable Feil, MD;  Location: [facility];  Service: Endoscopy;  Laterality: N/A;  . ESOPHAGOGASTRODUODENOSCOPY  03/2010   Dr. Verl Blalock: candida esophagitis, prolapsing hh with cameron lesions, multiple polyps  . EYE SURGERY Bilateral    cataracts  . Flexible sigmoidoscopy  05/2015   Dr. Peggye Form at [facility]: Performed off anticoagulation. 1-1.5 cm sessile sigmoid polyp removed, site tattooed. Pathology revealed tubular adenoma. Colonoscopy in 3-5 years as medically appropriate.  Marland Kitchen  PAROTIDECTOMY Left 11/14/2014  . PAROTIDECTOMY Left 11/14/2014   Procedure: LEFT LATERAL PAROTIDECTOMY;  Surgeon: Leta Baptist, MD;  Location: [facility];  Service: ENT;  Laterality: Left;  . PARS PLANA VITRECTOMY Right 04/15/2018   Procedure: PARS PLANA VITRECTOMY WITH 25 GAUGE;  Surgeon: Bernarda Caffey, MD;  Location: [facility];  Service: Ophthalmology;  Laterality: Right;  . PHOTOCOAGULATION WITH LASER Right 04/15/2018   Procedure: PHOTOCOAGULATION WITH LASER;  Surgeon: Bernarda Caffey, MD;  Location: [facility];  Service: Ophthalmology;  Laterality: Right;  . 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 TONSILLECTOMY      FAMILY HISTORY Family History  Problem Relation Age of Onset  . Parkinsonism Mother   . COPD Brother   . Asthma Brother   . Lung cancer Brother   . Heart attack Brother   . Diabetes Paternal Aunt     SOCIAL HISTORY Social History   Tobacco Use  . Smoking status: Never Smoker  . Smokeless tobacco: Never Used  Substance Use Topics  . Alcohol use: No  . Drug use: No         OPHTHALMIC EXAM:  Base Eye Exam    Visual Acuity (Snellen -  Linear)      Right Left   Dist cc 20/40 -2 20/25 -1   Dist ph cc 20/40 NI   Correction:  Glasses       Tonometry (Tonopen, 2:31 PM)      Right Left   Pressure 17 12       Pupils      Dark Light Shape React APD   Right 4 3 Round Brisk None   Left 3 2 Round Brisk None       Visual Fields (Counting fingers)      Left Right    Full Full       Extraocular Movement      Right Left    Full, Ortho Full, Ortho       Neuro/Psych    Oriented x3:  Yes   Mood/Affect:  Normal       Dilation    Both eyes:  1.0% Mydriacyl, 2.5% Phenylephrine @ 2:24 PM        Slit Lamp and Fundus Exam    Slit Lamp Exam      Right Left   Lids/Lashes 1+ erythema and edema dermato   Conjunctiva/Sclera White and quiet, sutures intact White and quiet   Cornea arcus; 1-2+ PEE greatest inf, tear film debris arcus; 2+ PEE   Anterior Chamber Deep; 1/2+cell/pigment, no hyphema Deep and quiet   Iris Round and dilated; + iridodonesis; peripheral TID at 0230 Round and reactive   Lens PC IOL in good position with open PC PCIOL   Vitreous post vitrectomy; 1+cell/pigment syneresis, Posterior vitreous detachment       Fundus Exam      Right Left   Disc sharp rim, mild elavation, ?disc drusen, PPP Normal   C/D Ratio 0.1 0.1   Macula Flat, Blunted foveal reflex, Drusen, Retinal pigment epithelial mottling, No heme or edema flat; blunted foveal reflex; mild RPE mottling   Vessels attenuated, AV crossing changes attenuated   Periphery Attached, good laser around tears at 1100 and 0800 attached; pigmented operculated hole at 0130; scattered reticular degeneration; stable from prior          IMAGING AND PROCEDURES  Imaging and Procedures for 04/03/18  OCT, Retina - OU - Both Eyes       Right  Eye Quality was good. Central Foveal Thickness: 314 (No image obtained). Progression has improved. Findings include normal foveal contour, epiretinal membrane, no SRF, intraretinal fluid (Interval improvement in  vitreous opacities/IRF).   Left Eye Quality was good. Central Foveal Thickness: 284. Progression has been stable. Findings include normal foveal contour, no IRF, no SRF (Tr ERM).   Notes *Images captured and stored on drive  Diagnosis / Impression:  OU: NFP; no IRF/SRF; tr ERM OD: interval improvement in vit opacities and IRF    Clinical management:  See below  Abbreviations: NFP - Normal foveal profile. CME - cystoid macular edema. PED - pigment epithelial detachment. IRF - intraretinal fluid. SRF - subretinal fluid. EZ - ellipsoid zone. ERM - epiretinal membrane. ORA - outer retinal atrophy. ORT - outer retinal tubulation. SRHM - subretinal hyper-reflective material                 ASSESSMENT/PLAN:    ICD-10-CM   1. Vitreous hemorrhage, right eye (HCC) H43.11   2. Hyphema of right eye H21.01   3. Essential hypertension I10   4. Hypertensive retinopathy of both eyes H35.033   5. Factor V deficiency ([place]) D68.2   6. Retinal hole, left H33.322   7. Posterior vitreous detachment of left eye H43.812   8. Pseudophakia of both eyes Z96.1   9. Retinal edema H35.81 OCT, Retina - OU - Both Eyes    1,2. Vitreous hemorrhage with hyphema OD - acute onset on Friday evening (04/01/2018)  ~0600 pm -- no pain or photopsias - BCVA at initial presentation 20/80 OD -- progressed over the week - pt with history of Factor V Leiden deficiency -- on coumadin - also has history of HTN which was recently uncontrolled due to medication changes - also reports recent violent coughing spells - now s/pPPV/EL/FAX + AC washout OD, 1.17.20             - intra-op -- dense heme in vit and AC -- two retinal tears noted intra-op and lasered             - today, vision and VH improving-- interval clearing of VH and microhyphema -- able to see posterior pole today  - BCVA 20/40 OD             - +iridodonesis -- IOL may be rubbing iris and causing heme -- may need UBM to look at IOL  position - IOP 17 mmHg - cont PF 3x/day OD - keep head upright - eye shield when sleeping/resting - post op drop and positioning instructions reviewed             - VH precautions reviewed -- minimize activities, keep head elevated, avoid ASA/NSAIDs/blood thinners as able - f/u 3 weeks  2,3. Hypertensive retinopathy OU - discussed importance of tight BP control - monitor..  4. Factor V Leiden - history of DVT and PE - currently on coumadin, 2.5 mg daily - pre-op INR was 1.65 - spoke with Dr. Willey Blade preoperatively who stated it is okay to hold coumadin perioperatively - pt restarted Coumadin 01.19.20  5. Operculated retinal hole OS - pigmented operculated hole without SRF at 0130 -- appears chronic and stable but recommend laser retinopexy to retinal break once VH a little more stable - The incidence, risk factors, and natural history of retinal tear was discussed with patient.   - Potential treatment options including laser retinopexy and cryotherapy discussed with patient. - monitor for now -- laser retinopexy once VH more  stable  6. PVD / vitreous syneresis  Discussed findings and prognosis  No RT or RD on 360 scleral depressed exam  Reviewed s/s of RT/RD  Strict return precautions for any such RT/RD signs/symptoms  7. Pseudophakia OU  - s/p CE/IOL OU by Dr. Dolores Lory  - doing well  - monitor  8. No retinal edema on exam or OCT   Ophthalmic Meds Ordered this visit:  No orders of the defined types were placed in this encounter.      Return in about 3 weeks (around 06/08/2018) for f/u VH OD, DFE, OCT.  There are no Patient Instructions on file for this visit.   Explained the diagnoses, plan, and follow up with the patient and they expressed understanding.  Patient expressed understanding of the importance of proper follow up care.   This document serves as a record of services personally performed by Gardiner Sleeper, MD, PhD. It was created on their behalf by Ernest Mallick, OA, an ophthalmic assistant. The creation of this record is the provider's dictation and/or activities during the visit.    Electronically signed by: Ernest Mallick, OA 02.18.2020 10:03 AM   Gardiner Sleeper, M.D., Ph.D. Diseases & Surgery of the Retina and Vitreous [facility]   I have reviewed the above documentation for accuracy and completeness, and I agree with the above. Gardiner Sleeper, M.D., Ph.D. 05/19/18 10:03 AM    Abbreviations: M myopia (nearsighted); A astigmatism; H hyperopia (farsighted); P presbyopia; Mrx spectacle prescription;  CTL contact lenses; OD right eye; OS left eye; OU both eyes  XT exotropia; ET esotropia; PEK punctate epithelial keratitis; PEE punctate epithelial erosions; DES dry eye syndrome; MGD meibomian gland dysfunction; ATs artificial tears; PFAT's preservative free artificial tears; [place] nuclear sclerotic cataract; PSC posterior subcapsular cataract; ERM epi-retinal membrane; PVD posterior vitreous detachment; RD retinal detachment; DM diabetes mellitus; DR diabetic retinopathy; NPDR non-proliferative diabetic retinopathy; PDR proliferative diabetic retinopathy; CSME clinically significant macular edema; DME diabetic macular edema; dbh dot blot hemorrhages; CWS cotton wool spot; POAG primary open angle glaucoma; C/D cup-to-disc ratio; HVF humphrey visual field; GVF goldmann visual field; OCT optical coherence tomography; IOP intraocular pressure; BRVO Branch retinal vein occlusion; CRVO central retinal vein occlusion; CRAO central retinal artery occlusion; BRAO branch retinal artery occlusion; RT retinal tear; SB scleral buckle; PPV pars plana vitrectomy; VH Vitreous hemorrhage; PRP panretinal laser photocoagulation; IVK intravitreal kenalog; VMT vitreomacular traction; MH Macular hole;  NVD neovascularization of the disc; NVE neovascularization elsewhere; AREDS age related eye  disease study; ARMD age related macular degeneration; POAG primary open angle glaucoma; EBMD epithelial/anterior basement membrane dystrophy; ACIOL anterior chamber intraocular lens; IOL intraocular lens; PCIOL posterior chamber intraocular lens; Phaco/IOL phacoemulsification with intraocular lens placement; [place] photorefractive keratectomy; LASIK laser assisted in situ keratomileusis; HTN hypertension; DM diabetes mellitus; COPD chronic obstructive pulmonary disease

## 2018-05-18 ENCOUNTER — Ambulatory Visit (INDEPENDENT_AMBULATORY_CARE_PROVIDER_SITE_OTHER): Payer: Medicare Other | Admitting: Ophthalmology

## 2018-05-18 ENCOUNTER — Encounter (INDEPENDENT_AMBULATORY_CARE_PROVIDER_SITE_OTHER): Payer: Self-pay | Admitting: Ophthalmology

## 2018-05-18 DIAGNOSIS — H2101 Hyphema, right eye: Secondary | ICD-10-CM

## 2018-05-18 DIAGNOSIS — I10 Essential (primary) hypertension: Secondary | ICD-10-CM

## 2018-05-18 DIAGNOSIS — H3581 Retinal edema: Secondary | ICD-10-CM | POA: Diagnosis not present

## 2018-05-18 DIAGNOSIS — Z961 Presence of intraocular lens: Secondary | ICD-10-CM

## 2018-05-18 DIAGNOSIS — H35033 Hypertensive retinopathy, bilateral: Secondary | ICD-10-CM

## 2018-05-18 DIAGNOSIS — H33322 Round hole, left eye: Secondary | ICD-10-CM

## 2018-05-18 DIAGNOSIS — H4311 Vitreous hemorrhage, right eye: Secondary | ICD-10-CM

## 2018-05-18 DIAGNOSIS — D682 Hereditary deficiency of other clotting factors: Secondary | ICD-10-CM

## 2018-05-18 DIAGNOSIS — H43812 Vitreous degeneration, left eye: Secondary | ICD-10-CM

## 2018-05-19 ENCOUNTER — Encounter (INDEPENDENT_AMBULATORY_CARE_PROVIDER_SITE_OTHER): Payer: Self-pay | Admitting: Ophthalmology

## 2018-05-25 ENCOUNTER — Ambulatory Visit (INDEPENDENT_AMBULATORY_CARE_PROVIDER_SITE_OTHER): Payer: Medicare Other | Admitting: Ophthalmology

## 2018-05-25 ENCOUNTER — Encounter (INDEPENDENT_AMBULATORY_CARE_PROVIDER_SITE_OTHER): Payer: Self-pay | Admitting: Ophthalmology

## 2018-05-25 DIAGNOSIS — H4311 Vitreous hemorrhage, right eye: Secondary | ICD-10-CM

## 2018-05-25 DIAGNOSIS — Z961 Presence of intraocular lens: Secondary | ICD-10-CM

## 2018-05-25 DIAGNOSIS — H3581 Retinal edema: Secondary | ICD-10-CM

## 2018-05-25 DIAGNOSIS — H2101 Hyphema, right eye: Secondary | ICD-10-CM

## 2018-05-25 DIAGNOSIS — I10 Essential (primary) hypertension: Secondary | ICD-10-CM | POA: Diagnosis not present

## 2018-05-25 DIAGNOSIS — H35033 Hypertensive retinopathy, bilateral: Secondary | ICD-10-CM | POA: Diagnosis not present

## 2018-05-25 DIAGNOSIS — D682 Hereditary deficiency of other clotting factors: Secondary | ICD-10-CM

## 2018-05-25 DIAGNOSIS — H33322 Round hole, left eye: Secondary | ICD-10-CM

## 2018-05-25 NOTE — Progress Notes (Signed)
[facility] Note  05/25/2018     CHIEF COMPLAINT Patient presents for Retina Follow Up   HISTORY OF PRESENT ILLNESS: Nancy Hicks is a 85 y.o. female who presents to the clinic today for:   HPI    Retina Follow Up    Patient presents with  Other.  In right eye.  Severity is severe.  Duration of 1 day.  Since onset it is rapidly worsening.  I, the attending physician,  performed the HPI with the patient and updated documentation appropriately.          Comments    Pt presents for blurry vision/pain OD, pt states her vision started getting blurry yesterday afternoon, she states it started out in one place but then spread over her entire eye, she states she started having pain associated with it this morning, pt states she has been sleeping in the bed since last Wednesday, but did sleep upright in a chair last night, pt states she is unable to see anything, she is still using pred forte TID and systane, she states the pain is radiating around the entire eye and feels like an ache, she states light makes it worse       Last edited by Bernarda Caffey, MD on 05/25/2018  1:25 PM. (History)    pt states her vision has become very blurry, she states it started as one spot in her eye and then become blurry all over, she states it also aches/hurts, pt states her coumadin dose has been doubled on Monday/thursday and remains the same the rest of the week, she states her INR was 2.4 yesterday which is "perfect"  Referring physician: Asencion Noble, MD [address] [place], [place] [address]  HISTORICAL INFORMATION:   Selected notes from the MEDICAL RECORD NUMBER    CURRENT MEDICATIONS: Current Outpatient Medications (Ophthalmic Drugs)  Medication Sig  . atropine 1 % ophthalmic solution Place 1 drop into the right eye 2 (two) times daily.  . bacitracin-polymyxin b (POLYSPORIN) ophthalmic ointment Place 1 application into the right eye at bedtime.  .  brimonidine (ALPHAGAN) 0.2 % ophthalmic solution Place 1 drop into the right eye 2 (two) times daily.  . dorzolamide (TRUSOPT) 2 % ophthalmic solution Place 1 drop into the right eye 2 (two) times daily.  Marland Kitchen erythromycin ophthalmic ointment Place 1 application into the right eye at bedtime.  Marland Kitchen gatifloxacin (ZYMAXID) 0.5 % SOLN Place 1 drop into the right eye 4 (four) times daily.  Vladimir Faster Glycol-Propyl Glycol (SYSTANE ULTRA PF) 0.4-0.3 % SOLN Place 2 drops into both eyes 3 (three) times daily.   . prednisoLONE acetate (PRED FORTE) 1 % ophthalmic suspension Place 1 drop into the right eye every hour while awake.   No current facility-administered medications for this visit.  (Ophthalmic Drugs)   Current Outpatient Medications (Other)  Medication Sig  . ADVAIR DISKUS 100-50 MCG/DOSE AEPB take 1 tablet twice a day (Patient taking differently: Inhale 1 puff into the lungs 2 (two) times daily. )  . allopurinol (ZYLOPRIM) 300 MG tablet Take 300 mg by mouth daily.    . Biotin 5000 MCG CAPS Take 1 capsule by mouth daily.  Marland Kitchen CARTIA XT 300 MG 24 hr capsule Take 300 mg by mouth daily.   . chlorthalidone (HYGROTON) 25 MG tablet Take 25 mg by mouth daily.  . Cholecalciferol (VITAMIN D3) 2000 UNITS TABS Take 1 tablet by mouth daily.    Marland Kitchen esomeprazole (NEXIUM) 40  MG capsule Take 40 mg by mouth daily before breakfast.   . fluticasone (FLONASE) 50 MCG/ACT nasal spray instill 2 sprays into each nostril once daily (Patient taking differently: Place 2 sprays into both nostrils daily as needed for allergies or rhinitis. )  . losartan (COZAAR) 100 MG tablet Take 100 mg by mouth daily.   . methenamine (HIPREX) 1 G tablet Take 500 mg by mouth 2 (two) times daily with a meal.   . methylcellulose (CITRUCEL) oral powder Take 1 packet by mouth 3 (three) times daily. Take 1 tsp by mouth daily   . Multiple Vitamin (MULITIVITAMIN WITH MINERALS) TABS Take 1 tablet by mouth daily.  . Multiple Vitamins-Minerals  (OCUVITE-LUTEIN PO) Take 1 tablet by mouth daily.  . sertraline (ZOLOFT) 50 MG tablet Take 50 mg by mouth at bedtime.   . simvastatin (ZOCOR) 10 MG tablet Take 10 mg by mouth at bedtime.    . VENTOLIN HFA 108 (90 BASE) MCG/ACT inhaler inhale 2 puffs by mouth every 6 hours AS NEEDED FOR WHEEZING (Patient taking differently: Inhale 2 puffs into the lungs every 6 (six) hours as needed for wheezing or shortness of breath. )  . warfarin (COUMADIN) 2.5 MG tablet Take 2.5 mg by mouth every evening. Takes 2.5 mg everyday.   No current facility-administered medications for this visit.  (Other)      REVIEW OF SYSTEMS: ROS    Positive for: Cardiovascular, Eyes, Respiratory   Negative for: Constitutional, Gastrointestinal, Neurological, Skin, Genitourinary, Musculoskeletal, HENT, Endocrine, Psychiatric, Allergic/Imm, Heme/Lymph   Last edited by Debbrah Alar, COT on 05/25/2018 12:55 PM. (History)       ALLERGIES Allergies  Allergen Reactions  . Metoclopramide Hcl Other (See Comments)    REACTION: causes uncontrolable leg movements  . Nitrofurantoin Nausea And Vomiting and Other (See Comments)    REACTION: dizziness    PAST MEDICAL HISTORY Past Medical History:  Diagnosis Date  . Anemia, unspecified   . Anxiety state, unspecified   . Blood dyscrasia    RV factor def  . Cancer ([place])    skin  . Degeneration of cervical intervertebral disc   . Disorder of bone and cartilage, unspecified   . Diverticulosis of colon (without mention of hemorrhage) 01/13/1999  . Esophageal reflux   . Esophageal stricture   . Factor V deficiency ([place])   . Factor V deficiency ([place])   . Hiatal hernia 04/09/2010   ? Paraesophageal component on chest CT 2006  . HTN (hypertension)   . Irritable bowel syndrome   . Neurogenic bladder   . Osteoarthrosis, unspecified whether generalized or localized, unspecified site   . Peripheral vascular disease (HCC)    pe, dvt hx  . Personal history of thrombophlebitis    . Personal history of venous thrombosis and embolism   . 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 Pure hypercholesterolemia   . Raynaud phenomenon   . Shortness of breath dyspnea   . Unspecified asthma(493.90)   . Unspecified disorder resulting from impaired renal function    cysts   Past Surgical History:  Procedure Laterality Date  . CATARACT EXTRACTION Bilateral   . CHOLECYSTECTOMY    . COLONOSCOPY  2000   Dr. Verl Blalock: moderate to severe sigmoid diverticulosis  . COLONOSCOPY  03/2015   Dr. Peggye Form at [facility]: TCS for abnormal sigmoid colon on PET. Moderate diverticulosis of the sigmoid colon, sessile 8-9 mm sigmoid: Polyp biopsied (given anticoagulation), two 3 mm ICV polyps removed. All polyps  were tubular adenomas.  . ESOPHAGEAL MANOMETRY  10/26/2011   Procedure: ESOPHAGEAL MANOMETRY (EM);  Surgeon: Sable Feil, MD;  Location: [facility];  Service: Endoscopy;  Laterality: N/A;  . ESOPHAGOGASTRODUODENOSCOPY  03/2010   Dr. Verl Blalock: candida esophagitis, prolapsing hh with cameron lesions, multiple polyps  . EYE SURGERY Bilateral    cataracts  . Flexible sigmoidoscopy  05/2015   Dr. Peggye Form at [facility]: Performed off anticoagulation. 1-1.5 cm sessile sigmoid polyp removed, site tattooed. Pathology revealed tubular adenoma. Colonoscopy in 3-5 years as medically appropriate.  Marland Kitchen PAROTIDECTOMY Left 11/14/2014  . PAROTIDECTOMY Left 11/14/2014   Procedure: LEFT LATERAL PAROTIDECTOMY;  Surgeon: Leta Baptist, MD;  Location: [place];  Service: ENT;  Laterality: Left;  . PARS PLANA VITRECTOMY Right 04/15/2018   Procedure: PARS PLANA VITRECTOMY WITH 25 GAUGE;  Surgeon: Bernarda Caffey, MD;  Location: [place];  Service: Ophthalmology;  Laterality: Right;  . PHOTOCOAGULATION WITH LASER Right 04/15/2018   Procedure: PHOTOCOAGULATION WITH LASER;  Surgeon: Bernarda Caffey, MD;  Location: [facility];  Service: Ophthalmology;  Laterality: Right;  . 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  TONSILLECTOMY      FAMILY HISTORY Family History  Problem Relation Age of Onset  . Parkinsonism Mother   . COPD Brother   . Asthma Brother   . Lung cancer Brother   . Heart attack Brother   . Diabetes Paternal Aunt     SOCIAL HISTORY Social History   Tobacco Use  . Smoking status: Never Smoker  . Smokeless tobacco: Never Used  Substance Use Topics  . Alcohol use: No  . Drug use: No         OPHTHALMIC EXAM:  Base Eye Exam    Visual Acuity (Snellen - Linear)      Right Left   Dist [place] HM 20/25 -2   Dist ph [place]  20/20 -2       Tonometry (Tonopen, 1:02 PM)      Right Left   Pressure 25 9       Tonometry #2      Right Left   Pressure 26        Pupils      Dark Light Shape React APD   Right 3 2 Round Slow None   Left 3 2 Round Slow None       Visual Fields      Left Right    Full    Restrictions  Total superior temporal, inferior temporal, superior nasal, inferior nasal deficiencies       Neuro/Psych    Oriented x3:  Yes   Mood/Affect:  Normal       Dilation    Right eye:  1.0% Mydriacyl, 2.5% Phenylephrine @ 1:02 PM        Slit Lamp and Fundus Exam    Slit Lamp Exam      Right Left   Lids/Lashes 1+ erythema and edema dermato   Conjunctiva/Sclera White and quiet, sutures intact White and quiet   Cornea arcus; 1-2+ PEE greatest inf, tear film debris arcus; 2+ PEE   Anterior Chamber Deep; 4+RBC Deep and quiet   Iris no details visible Round and reactive   Lens no details visible PCIOL   Vitreous post vitrectomy; 1+cell/pigment syneresis, Posterior vitreous detachment       Fundus Exam      Right Left   Disc sharp rim, mild elavation, ?disc drusen, PPP Normal   C/D Ratio 0.1 0.1  Macula Flat, Blunted foveal reflex, Drusen, Retinal pigment epithelial mottling, No heme or edema flat; blunted foveal reflex; mild RPE mottling   Vessels attenuated, AV crossing changes attenuated   Periphery Attached, good laser around tears at 1100 and 0800  attached; pigmented operculated hole at 0130; scattered reticular degeneration; stable from prior          IMAGING AND PROCEDURES  Imaging and Procedures for 04/03/18  OCT, Retina - OU - Both Eyes       Right Eye Quality was good. Central Foveal Thickness: (No image obtained). Progression has worsened. Findings include (No scan to interpret).   Left Eye Quality was good. Central Foveal Thickness: 286. Progression has been stable. Findings include normal foveal contour, no IRF, no SRF (Tr ERM; focal ellipsoid disruption central fovea).   Notes *Images captured and stored on drive  Diagnosis / Impression:  OU: no scan to interpret OD: trace ERM; focal ellipsoid disruption central fovea    Clinical management:  See below  Abbreviations: NFP - Normal foveal profile. CME - cystoid macular edema. PED - pigment epithelial detachment. IRF - intraretinal fluid. SRF - subretinal fluid. EZ - ellipsoid zone. ERM - epiretinal membrane. ORA - outer retinal atrophy. ORT - outer retinal tubulation. SRHM - subretinal hyper-reflective material        B-Scan Ultrasound - OD - Right Eye       Quality was good. Findings included vitreous hemorrhage, vitreous opacities.   Notes **Images stored on drive**  Impression: OD: vitreous opacities consistent with hemorrhage; focal hyperechoic signal at optic disc ?disc drusen; no obvious RT/RD or mass; +phacodonesis and +iridodonesis -- ?UGH                ASSESSMENT/PLAN:    ICD-10-CM   1. Vitreous hemorrhage, right eye (HCC) H43.11 B-Scan Ultrasound - OD - Right Eye  2. Hyphema of right eye H21.01 B-Scan Ultrasound - OD - Right Eye  3. Essential hypertension I10   4. Hypertensive retinopathy of both eyes H35.033   5. Factor V deficiency ([place]) D68.2   6. Retinal hole, left H33.322   7. Pseudophakia of both eyes Z96.1   8. Retinal edema H35.81 OCT, Retina - OU - Both Eyes    1,2. Vitreous hemorrhage with hyphema OD - acute  onset on Friday evening (04/01/2018)  ~0600 pm -- no pain or photopsias - BCVA at initial presentation 20/80 OD -- progressed over the week - pt with history of Factor V Leiden deficiency -- on coumadin - also has history of HTN which was recently uncontrolled due to medication changes - also reports recent violent coughing spells - now s/pPPV/EL/FAX + AC washout OD, 1.17.20             - intra-op -- dense heme in vit and AC -- two retinal tears noted intra-op and lasered  - had a mild recurrent VH immediately post-op, which cleared medically             - today, recurrent VH and hyphema onset 02.25.20, accompanied by pain and photophobia  - BCVA HM OD             - +iridodonesis -- IOL may be rubbing iris and causing heme -- recommend UBM to look at IOL position - IOP 25/26 mmHg - increase PF q1h OD  - restart Cosopt/Brimonidine BID OD  - restart tropicamide 1% BID -- adverse reaction to atropine - strict bedrest and keep head upright - eye shield when sleeping/resting -  post op drop and positioning instructions reviewed             - VH precautions reviewed -- minimize activities, keep head elevated, avoid ASA/NSAIDs/blood thinners as able  - appt with Dr. Manuella Ghazi at [facility] on Monday, 03.2.20 at 1:30 for UBM  3,4. Hypertensive retinopathy OU - discussed importance of tight BP control - monitor  5. Factor V Leiden - history of DVT and PE - currently on coumadin, 2.5 mg daily - pre-op INR was 1.65 - spoke with Dr. Willey Blade preoperatively who stated it is okay to hold coumadin perioperatively - pt restarted Coumadin 01.19.20  6. Operculated retinal hole OS - pigmented operculated hole without SRF at 0130 -- appears chronic and stable but recommend laser retinopexy to retinal break once VH a little more stable - The incidence, risk factors, and natural history of retinal tear was discussed with patient.   - Potential  treatment options including laser retinopexy and cryotherapy discussed with patient. -monitor for now -- laser retinopexy once VH more stable   7. Pseudophakia OU  - s/p CE/IOL OU by Dr. Dolores Lory  - concern that PCIOL OD may be causing recurrent hyphema and vitreous hemorrhages  8. No retinal edema on exam or OCT   Ophthalmic Meds Ordered this visit:  No orders of the defined types were placed in this encounter.      Return in about 1 week (around 06/01/2018) for Dilated Exam, OCT.  There are no Patient Instructions on file for this visit.   Explained the diagnoses, plan, and follow up with the patient and they expressed understanding.  Patient expressed understanding of the importance of proper follow up care.   This document serves as a record of services personally performed by Gardiner Sleeper, MD, PhD. It was created on their behalf by Ernest Mallick, OA, an ophthalmic assistant. The creation of this record is the provider's dictation and/or activities during the visit.    Electronically signed by: Ernest Mallick, OA  02.26.2020 5:15 PM    Gardiner Sleeper, M.D., Ph.D. Diseases & Surgery of the Retina and Vitreous [facility]  I have reviewed the above documentation for accuracy and completeness, and I agree with the above. Gardiner Sleeper, M.D., Ph.D. 05/25/18 5:15 PM   Abbreviations: M myopia (nearsighted); A astigmatism; H hyperopia (farsighted); P presbyopia; Mrx spectacle prescription;  CTL contact lenses; OD right eye; OS left eye; OU both eyes  XT exotropia; ET esotropia; PEK punctate epithelial keratitis; PEE punctate epithelial erosions; DES dry eye syndrome; MGD meibomian gland dysfunction; ATs artificial tears; PFAT's preservative free artificial tears; [place] nuclear sclerotic cataract; PSC posterior subcapsular cataract; ERM epi-retinal membrane; PVD posterior vitreous detachment; RD retinal detachment; DM diabetes mellitus; DR diabetic retinopathy; NPDR  non-proliferative diabetic retinopathy; PDR proliferative diabetic retinopathy; CSME clinically significant macular edema; DME diabetic macular edema; dbh dot blot hemorrhages; CWS cotton wool spot; POAG primary open angle glaucoma; C/D cup-to-disc ratio; HVF humphrey visual field; GVF goldmann visual field; OCT optical coherence tomography; IOP intraocular pressure; BRVO Branch retinal vein occlusion; CRVO central retinal vein occlusion; CRAO central retinal artery occlusion; BRAO branch retinal artery occlusion; RT retinal tear; SB scleral buckle; PPV pars plana vitrectomy; VH Vitreous hemorrhage; PRP panretinal laser photocoagulation; IVK intravitreal kenalog; VMT vitreomacular traction; MH Macular hole;  NVD neovascularization of the disc; NVE neovascularization elsewhere; AREDS age related eye disease study; ARMD age related macular degeneration; POAG primary open angle glaucoma; EBMD epithelial/anterior basement membrane  dystrophy; ACIOL anterior chamber intraocular lens; IOL intraocular lens; PCIOL posterior chamber intraocular lens; Phaco/IOL phacoemulsification with intraocular lens placement; [place] photorefractive keratectomy; LASIK laser assisted in situ keratomileusis; HTN hypertension; DM diabetes mellitus; COPD chronic obstructive pulmonary disease

## 2018-06-01 ENCOUNTER — Encounter (INDEPENDENT_AMBULATORY_CARE_PROVIDER_SITE_OTHER): Payer: Medicare Other | Admitting: Ophthalmology

## 2018-06-08 ENCOUNTER — Encounter (INDEPENDENT_AMBULATORY_CARE_PROVIDER_SITE_OTHER): Payer: Medicare Other | Admitting: Ophthalmology

## 2018-06-22 ENCOUNTER — Other Ambulatory Visit (INDEPENDENT_AMBULATORY_CARE_PROVIDER_SITE_OTHER): Payer: Self-pay | Admitting: Ophthalmology

## 2018-06-23 ENCOUNTER — Other Ambulatory Visit (INDEPENDENT_AMBULATORY_CARE_PROVIDER_SITE_OTHER): Payer: Self-pay | Admitting: Ophthalmology

## 2018-06-24 ENCOUNTER — Other Ambulatory Visit (INDEPENDENT_AMBULATORY_CARE_PROVIDER_SITE_OTHER): Payer: Self-pay

## 2018-06-24 MED ORDER — PREDNISOLONE ACETATE 1 % OP SUSP: 1.0000 [drp] | OPHTHALMIC | 1 refills | Status: DC

## 2018-07-24 ENCOUNTER — Encounter: Payer: Self-pay | Admitting: Orthopaedic Surgery

## 2018-07-27 ENCOUNTER — Other Ambulatory Visit (HOSPITAL_COMMUNITY): Payer: Medicare Other

## 2018-08-03 ENCOUNTER — Ambulatory Visit (HOSPITAL_COMMUNITY): Payer: Medicare Other | Admitting: Nurse Practitioner

## 2018-08-04 ENCOUNTER — Other Ambulatory Visit (INDEPENDENT_AMBULATORY_CARE_PROVIDER_SITE_OTHER): Payer: Self-pay | Admitting: Ophthalmology

## 2018-08-04 MED ORDER — PREDNISOLONE ACETATE 1 % OP SUSP: 1.0000 [drp] | OPHTHALMIC | 2 refills | Status: DC

## 2018-08-16 DIAGNOSIS — T859XXA Unspecified complication of internal prosthetic device, implant and graft, initial encounter: Secondary | ICD-10-CM | POA: Insufficient documentation

## 2018-08-16 DIAGNOSIS — H4311 Vitreous hemorrhage, right eye: Secondary | ICD-10-CM | POA: Insufficient documentation

## 2018-08-18 ENCOUNTER — Other Ambulatory Visit (INDEPENDENT_AMBULATORY_CARE_PROVIDER_SITE_OTHER): Payer: Self-pay

## 2018-08-18 MED ORDER — PREDNISOLONE ACETATE 1 % OP SUSP: 1.0000 [drp] | OPHTHALMIC | 1 refills | Status: AC

## 2018-08-18 MED ORDER — PREDNISOLONE ACETATE 1 % OP SUSP: 1.0000 [drp] | OPHTHALMIC | 2 refills | Status: AC

## 2018-12-20 ENCOUNTER — Ambulatory Visit (INDEPENDENT_AMBULATORY_CARE_PROVIDER_SITE_OTHER): Payer: Medicare Other | Admitting: Urology

## 2018-12-20 DIAGNOSIS — R1030 Lower abdominal pain, unspecified: Secondary | ICD-10-CM

## 2018-12-20 DIAGNOSIS — N3020 Other chronic cystitis without hematuria: Secondary | ICD-10-CM | POA: Diagnosis not present

## 2019-05-02 DIAGNOSIS — H3581 Retinal edema: Secondary | ICD-10-CM | POA: Diagnosis not present

## 2019-05-02 DIAGNOSIS — H30031 Focal chorioretinal inflammation, peripheral, right eye: Secondary | ICD-10-CM | POA: Diagnosis not present

## 2019-05-02 DIAGNOSIS — Z961 Presence of intraocular lens: Secondary | ICD-10-CM | POA: Diagnosis not present

## 2019-05-02 DIAGNOSIS — H4311 Vitreous hemorrhage, right eye: Secondary | ICD-10-CM | POA: Diagnosis not present

## 2019-05-02 DIAGNOSIS — H35372 Puckering of macula, left eye: Secondary | ICD-10-CM | POA: Diagnosis not present

## 2019-05-02 DIAGNOSIS — T8522XD Displacement of intraocular lens, subsequent encounter: Secondary | ICD-10-CM | POA: Diagnosis not present

## 2019-05-03 DIAGNOSIS — Z7901 Long term (current) use of anticoagulants: Secondary | ICD-10-CM | POA: Diagnosis not present

## 2019-06-01 DIAGNOSIS — Z7901 Long term (current) use of anticoagulants: Secondary | ICD-10-CM | POA: Diagnosis not present

## 2019-06-06 DIAGNOSIS — H3581 Retinal edema: Secondary | ICD-10-CM | POA: Diagnosis not present

## 2019-06-06 DIAGNOSIS — H30031 Focal chorioretinal inflammation, peripheral, right eye: Secondary | ICD-10-CM | POA: Diagnosis not present

## 2019-06-06 DIAGNOSIS — H35372 Puckering of macula, left eye: Secondary | ICD-10-CM | POA: Diagnosis not present

## 2019-06-06 DIAGNOSIS — Z961 Presence of intraocular lens: Secondary | ICD-10-CM | POA: Diagnosis not present

## 2019-06-06 DIAGNOSIS — T8522XD Displacement of intraocular lens, subsequent encounter: Secondary | ICD-10-CM | POA: Diagnosis not present

## 2019-06-06 DIAGNOSIS — H4311 Vitreous hemorrhage, right eye: Secondary | ICD-10-CM | POA: Diagnosis not present

## 2019-06-12 DIAGNOSIS — H52223 Regular astigmatism, bilateral: Secondary | ICD-10-CM | POA: Diagnosis not present

## 2019-06-12 DIAGNOSIS — H524 Presbyopia: Secondary | ICD-10-CM | POA: Diagnosis not present

## 2019-06-12 IMAGING — MR MR MRA HEAD W/O CM
18 of 21 series · 33 of 48 positions shown · IV contrast (gadavist)
Comparison: Prior CT from earlier the same day.

CLINICAL DATA: Initial evaluation for acute right ocular visual
loss.

EXAM:
MRI HEAD WITHOUT AND WITH CONTRAST
MRA HEAD WITHOUT CONTRAST
MRA NECK WITHOUT AND WITH CONTRAST
TECHNIQUE: Multiplanar, multiecho pulse sequences of the brain and surrounding
structures were obtained without intravenous contrast. Angiographic
images of the Circle of Willis were obtained using MRA technique
without intravenous contrast. Angiographic images of the neck were
obtained using MRA technique without and with intravenous contrast.
Carotid stenosis measurements (when applicable) are obtained
utilizing NASCET criteria, using the distal internal carotid
diameter as the denominator.
CONTRAST:  60 cc of Gadavist.

[Series 5: DWI · axial · 3.0mm · 0.88mm/px · z∈[-67,+73]mm · 6 of 96 slices shown (1 of 4)]
[im 1/96]
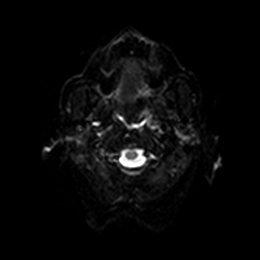
[im 20/96]
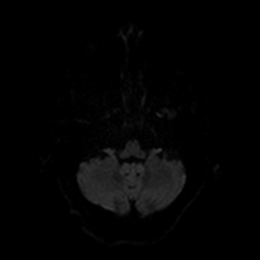
[im 39/96]
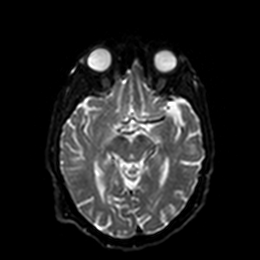
[im 58/96]
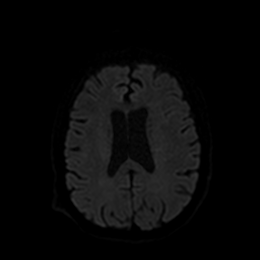
[im 77/96]
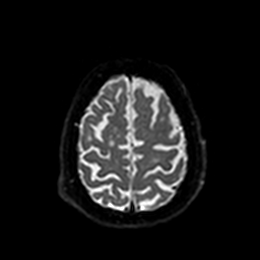
[im 96/96]
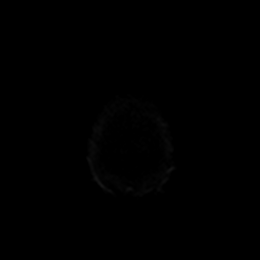

[Series 6: DWI · axial · 3.0mm · 0.88mm/px · z∈[-67,+73]mm · 3 of 48 slices shown (2 of 4)]
[im 1/48]
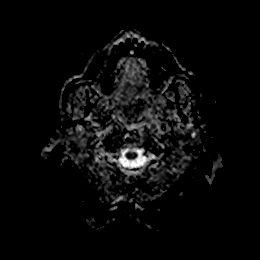
[im 24/48]
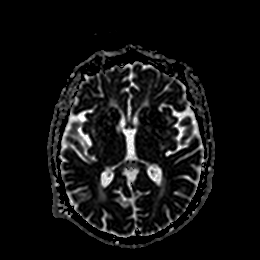
[im 48/48]
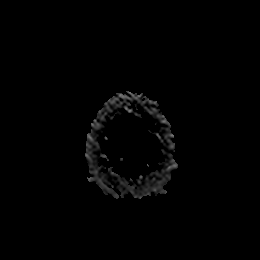

[Series 7: DWI · coronal · 4.0mm · 0.88mm/px · 4 of 68 slices shown (3 of 4)]
[im 1/68]
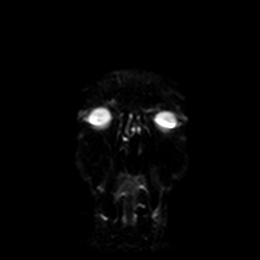
[im 23/68]
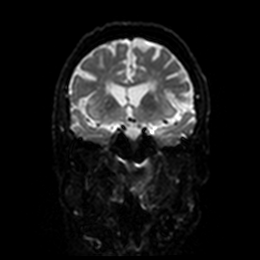
[im 45/68]
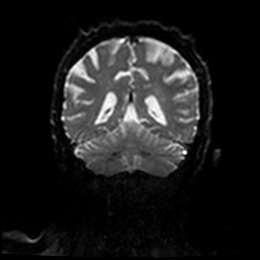
[im 68/68]
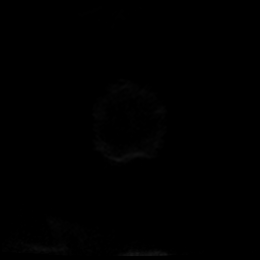

[Series 8: DWI · coronal · 4.0mm · 0.88mm/px · 2 of 34 slices shown (4 of 4)]
[im 1/34]
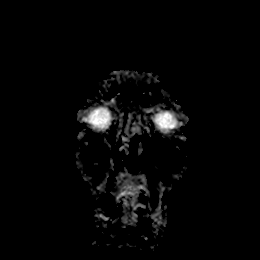
[im 34/34]
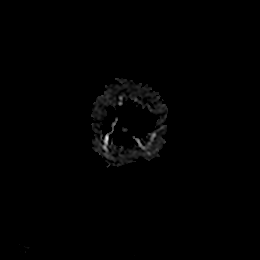

[Series 13: T1 · sagittal · 5.0mm · 0.75mm/px · 1 of 23 slices shown (1 of 3)]
[im 1/23]
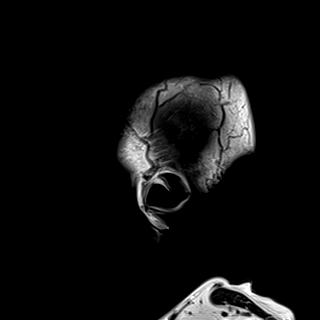

[Series 14: T2 · axial · 5.0mm · 0.72mm/px · 1 of 25 slices shown]
[im 1/25]
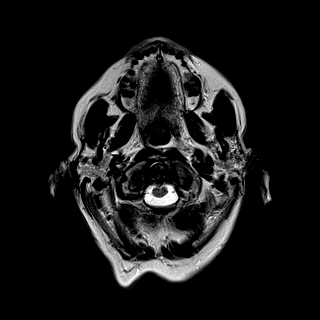

[Series 15: FLAIR · axial · 5.0mm · 0.45mm/px · 1 of 25 slices shown]
[im 1/25]
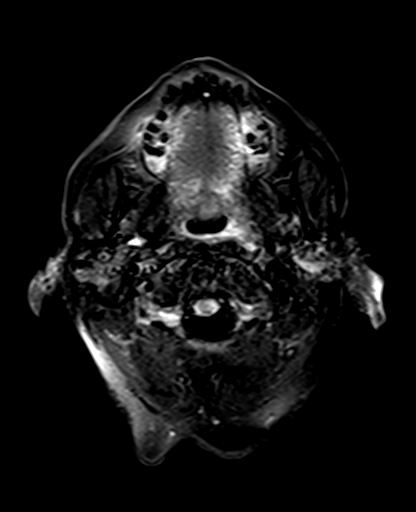

[Series 16: swi_images · axial · 3.0mm · 0.90mm/px · z∈[-80,+96]mm · 3 of 60 slices shown]
[im 1/60]
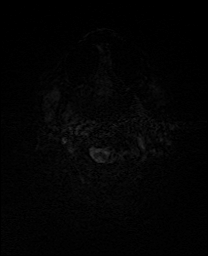
[im 30/60]
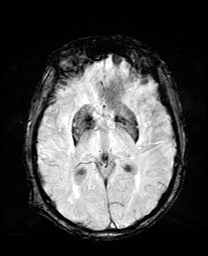
[im 60/60]
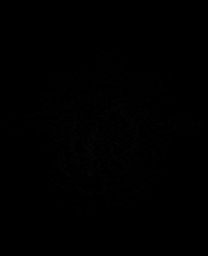

[Series 17: mip_images(sw) · axial · 24.0mm · 0.90mm/px · z∈[-69,+85]mm · 3 of 53 slices shown]
[im 1/53]
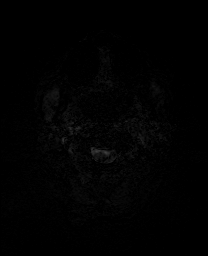
[im 27/53]
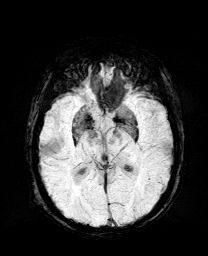
[im 53/53]
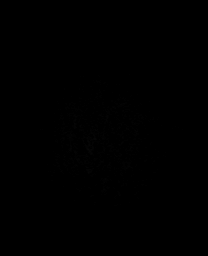

[Series 22: T2 fat-sat · axial · 3.0mm · 0.51mm/px · 1 of 20 slices shown (1 of 2)]
[im 1/20]
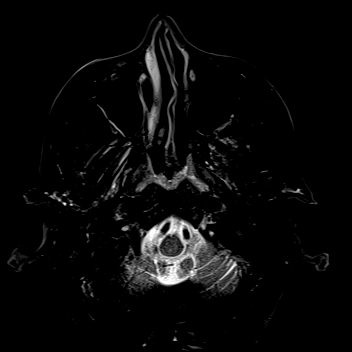

[Series 28: T1 · axial · non-contrast · 3.0mm · 0.35mm/px · 1 of 20 slices shown (2 of 3)]
[im 1/20]
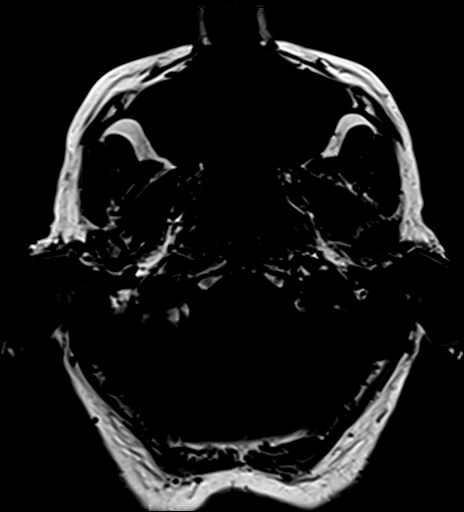

[Series 31: T2 fat-sat · coronal · 4.0mm · 0.51mm/px · 1 of 24 slices shown (2 of 2)]
[im 1/24]
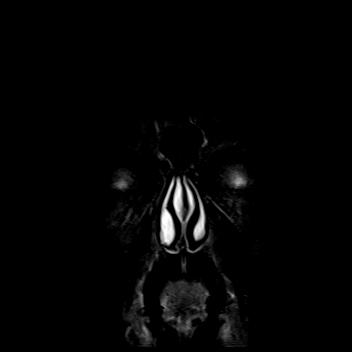

[Series 32: T1 · coronal · 4.0mm · 0.35mm/px · 1 of 24 slices shown (3 of 3)]
[im 1/24]
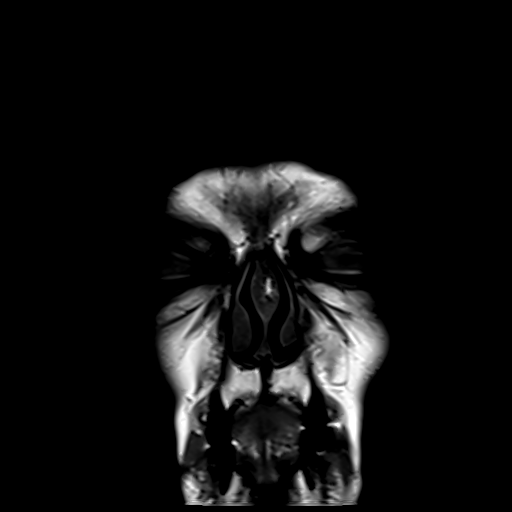

[Series 33: T2 post-contrast · coronal · 5.0mm · 0.72mm/px · 1 of 28 slices shown]
[im 1/28]
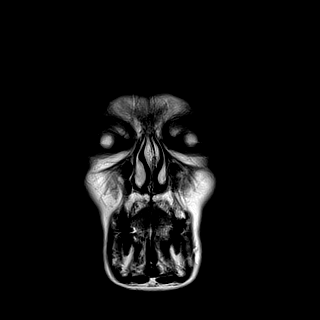

[Series 34: T1 fat-sat post-contrast · axial · 3.0mm · 0.35mm/px · 1 of 20 slices shown (1 of 2)]
[im 1/20]
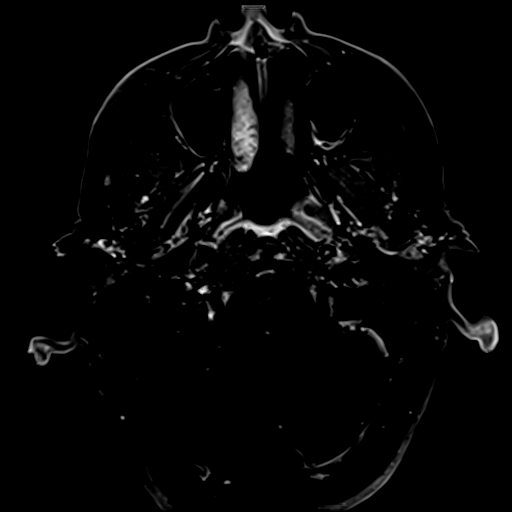

[Series 35: T1 fat-sat post-contrast · coronal · 4.0mm · 0.35mm/px · 1 of 24 slices shown (2 of 2)]
[im 1/24]
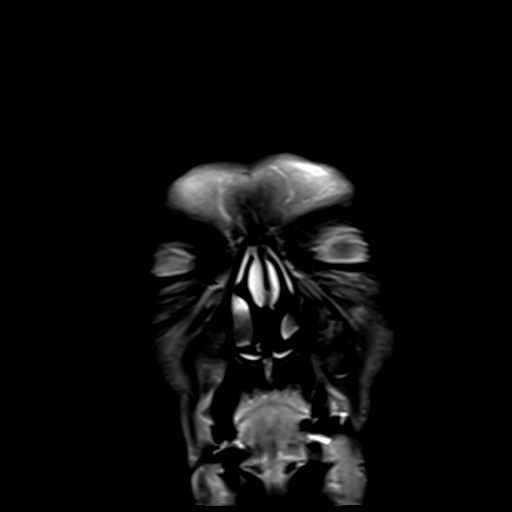

[Series 37: T1 post-contrast · coronal · 5.0mm · 0.34mm/px · 1 of 28 slices shown (1 of 2)]
[im 1/28]
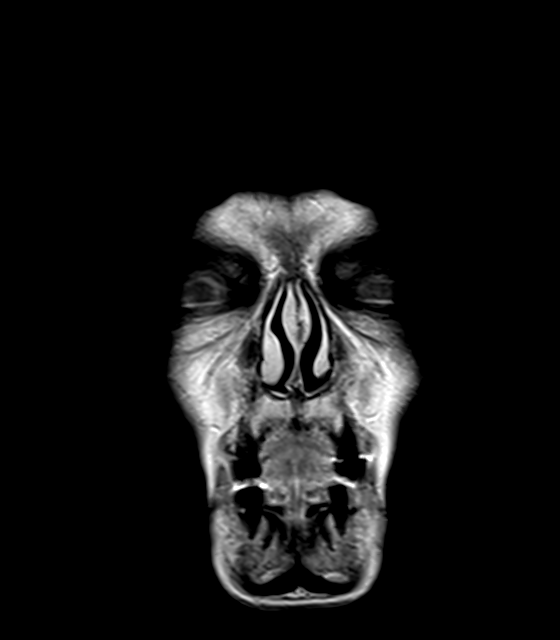

[Series 38: T1 post-contrast · sagittal · 5.0mm · 0.75mm/px · 1 of 23 slices shown (2 of 2)]
[im 1/23]
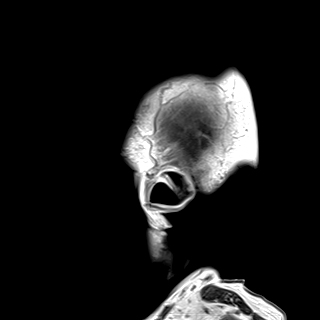

[33 of 48 positions shown; findings below may reference images not displayed]

FINDINGS: MRI HEAD FINDINGS

Diffuse prominence of the CSF containing spaces compatible with
generalized age-related cerebral atrophy. Patchy and confluent
T2/FLAIR hyperintensity within the periventricular and deep white
matter both cerebral hemispheres most consistent with chronic small
vessel ischemic disease, moderate nature.

No abnormal foci of restricted diffusion to suggest acute or
subacute ischemia. Gray-white matter differentiation maintained. No
encephalomalacia to suggest chronic infarction. No foci of
susceptibility artifact to suggest acute or chronic intracranial
hemorrhage.

No mass lesion, midline shift or mass effect. No hydrocephalus. No
extra-axial fluid collection. Pituitary gland and suprasellar region
within normal limits. Midline structures intact and normal.

No abnormal enhancement.

Major intracranial vascular flow voids maintained.

Degenerative thickening at the tectorial membrane noted.
Craniocervical junction otherwise unremarkable. Multilevel
degenerative spondylolysis noted within the upper cervical spine
with resultant mild diffuse spinal stenosis. Bone marrow signal
intensity within normal limits. No scalp soft tissue abnormality.

No mastoid effusion.  Inner ear structures grossly normal.

MRI ORBITS FINDINGS

Globes are symmetric in size with normal appearance and morphology
bilaterally. Patient status post bilateral ocular lens replacement.
Optic nerves symmetric in size in appearance. No intrinsic optic
nerve edema or enhancement. No abnormality along the optic nerve
sheaths. No abnormality at the orbital apices are about the
cavernous sinus. Optic chiasm normally situated within the
suprasellar cistern without abnormality. Visible optic radiations
within normal limits.

Extra-ocular muscles symmetric and normal in appearance bilaterally.
Lacrimal glands normal. Superior orbital veins symmetric and normal.
Intraconal and extraconal fat well-maintained.

Mild scattered mucosal thickening within the ethmoidal air cells.
Hypoplastic right sphenoid sinus largely opacified. Paranasal
sinuses are otherwise clear.

Periorbital soft tissues are normal bilaterally.

MRA HEAD FINDINGS

ANTERIOR CIRCULATION:

Distal cervical segments of the internal carotid arteries are widely
patent with symmetric antegrade flow. Petrous, cavernous, and
supraclinoid segments widely patent bilaterally. Origin of the
ophthalmic arteries patent. ICA termini well perfused and symmetric.
A1 segments patent bilaterally. Normal anterior communicating
artery. Anterior cerebral arteries widely patent to their distal
aspects without stenosis. M1 segments widely patent bilaterally.
Normal MCA bifurcations. Distal MCA branches well perfused and
symmetric.

POSTERIOR CIRCULATION:

Vertebral arteries widely patent to the vertebrobasilar junction.
Right PICA patent proximally. Left PICA not visualized. Basilar
widely patent to its distal aspect. Superior cerebral arteries
patent bilaterally. Right PCA primarily supplied via the basilar.
Hypoplastic left P1 with prominent right posterior communicating
artery. PCAs widely patent to their distal aspects without stenosis.

No intracranial aneurysm or other vascular abnormality.

MRA NECK FINDINGS

Visualized aortic arch normal in caliber with normal branch pattern.
No hemodynamically significant stenosis about the origin of the
great vessels. Visualized subclavian arteries widely patent
bilaterally.

Right common carotid artery tortuous proximally but widely patent to
the bifurcation without stenosis. No significant atheromatous
narrowing about the right bifurcation. Right ICA widely patent from
the bifurcation to the circle-of-Willis without stenosis,
dissection, or occlusion.

Left common carotid artery mildly tortuous but widely patent to the
bifurcation without stenosis. Short-segment mild atheromatous
narrowing at the left bifurcation of no more than 30-40% by NASCET
criteria. Left ICA widely patent distally to the circle-of-Willis
without stenosis, dissection, or occlusion.

Both vertebral arteries arise from the subclavian arteries.
Vertebral arteries mildly tortuous but widely patent within the neck
without stenosis or occlusion. Vertebral arteries largely
codominant.
IMPRESSION: MRI HEAD IMPRESSION:

1. No acute intracranial abnormality.
2. Age-related cerebral atrophy with moderate chronic microvascular
ischemic disease.

MRI ORBITS IMPRESSION:

No acute abnormality identified about the orbits. No structural
findings to explain patient's symptoms identified.

MRA HEAD IMPRESSION:

Normal intracranial MRA. No large vessel occlusion. No
hemodynamically significant or correctable stenosis.

MRA NECK IMPRESSION:

1. Mild atheromatous narrowing of approximately 30-40% at the origin
of the left ICA. Left carotid artery system otherwise widely patent.
2. Widely patent right carotid artery system without significant
stenosis.
3. Vertebral arteries widely patent within the neck.

## 2019-06-14 DIAGNOSIS — H30031 Focal chorioretinal inflammation, peripheral, right eye: Secondary | ICD-10-CM | POA: Diagnosis not present

## 2019-07-10 DIAGNOSIS — Z7901 Long term (current) use of anticoagulants: Secondary | ICD-10-CM | POA: Diagnosis not present

## 2019-08-09 DIAGNOSIS — Z7901 Long term (current) use of anticoagulants: Secondary | ICD-10-CM | POA: Diagnosis not present

## 2019-08-22 DIAGNOSIS — Z5181 Encounter for therapeutic drug level monitoring: Secondary | ICD-10-CM | POA: Diagnosis not present

## 2019-08-22 DIAGNOSIS — H30031 Focal chorioretinal inflammation, peripheral, right eye: Secondary | ICD-10-CM | POA: Diagnosis not present

## 2019-08-22 DIAGNOSIS — H35372 Puckering of macula, left eye: Secondary | ICD-10-CM | POA: Diagnosis not present

## 2019-08-22 DIAGNOSIS — Z961 Presence of intraocular lens: Secondary | ICD-10-CM | POA: Diagnosis not present

## 2019-08-22 DIAGNOSIS — H3581 Retinal edema: Secondary | ICD-10-CM | POA: Diagnosis not present

## 2019-08-22 DIAGNOSIS — T8522XD Displacement of intraocular lens, subsequent encounter: Secondary | ICD-10-CM | POA: Diagnosis not present

## 2019-08-22 DIAGNOSIS — H4311 Vitreous hemorrhage, right eye: Secondary | ICD-10-CM | POA: Diagnosis not present

## 2019-08-22 DIAGNOSIS — Z79899 Other long term (current) drug therapy: Secondary | ICD-10-CM | POA: Diagnosis not present

## 2019-08-31 ENCOUNTER — Encounter (HOSPITAL_COMMUNITY): Payer: Self-pay | Admitting: Emergency Medicine

## 2019-08-31 ENCOUNTER — Emergency Department (HOSPITAL_COMMUNITY): Payer: Medicare PPO

## 2019-08-31 ENCOUNTER — Emergency Department (HOSPITAL_COMMUNITY)
Admission: EM | Admit: 2019-08-31 | Discharge: 2019-08-31 | Disposition: A | Discharge status: Home / Self Care | Payer: Medicare PPO | Attending: Emergency Medicine | Admitting: Emergency Medicine

## 2019-08-31 ENCOUNTER — Other Ambulatory Visit: Payer: Self-pay

## 2019-08-31 DIAGNOSIS — Y929 Unspecified place or not applicable: Secondary | ICD-10-CM | POA: Insufficient documentation

## 2019-08-31 DIAGNOSIS — Y9389 Activity, other specified: Secondary | ICD-10-CM | POA: Insufficient documentation

## 2019-08-31 DIAGNOSIS — I129 Hypertensive chronic kidney disease with stage 1 through stage 4 chronic kidney disease, or unspecified chronic kidney disease: Secondary | ICD-10-CM | POA: Diagnosis not present

## 2019-08-31 DIAGNOSIS — S3991XA Unspecified injury of abdomen, initial encounter: Secondary | ICD-10-CM | POA: Diagnosis not present

## 2019-08-31 DIAGNOSIS — S20211A Contusion of right front wall of thorax, initial encounter: Secondary | ICD-10-CM | POA: Diagnosis not present

## 2019-08-31 DIAGNOSIS — S299XXA Unspecified injury of thorax, initial encounter: Secondary | ICD-10-CM | POA: Diagnosis not present

## 2019-08-31 DIAGNOSIS — R1011 Right upper quadrant pain: Secondary | ICD-10-CM | POA: Insufficient documentation

## 2019-08-31 DIAGNOSIS — Z79899 Other long term (current) drug therapy: Secondary | ICD-10-CM | POA: Diagnosis not present

## 2019-08-31 DIAGNOSIS — J449 Chronic obstructive pulmonary disease, unspecified: Secondary | ICD-10-CM | POA: Insufficient documentation

## 2019-08-31 DIAGNOSIS — W06XXXA Fall from bed, initial encounter: Secondary | ICD-10-CM | POA: Diagnosis not present

## 2019-08-31 DIAGNOSIS — Z7901 Long term (current) use of anticoagulants: Secondary | ICD-10-CM | POA: Diagnosis not present

## 2019-08-31 DIAGNOSIS — N189 Chronic kidney disease, unspecified: Secondary | ICD-10-CM | POA: Insufficient documentation

## 2019-08-31 DIAGNOSIS — Y999 Unspecified external cause status: Secondary | ICD-10-CM | POA: Insufficient documentation

## 2019-08-31 DIAGNOSIS — R0781 Pleurodynia: Secondary | ICD-10-CM | POA: Diagnosis present

## 2019-08-31 LAB — COMPREHENSIVE METABOLIC PANEL
ALT: 15 U/L (ref 0–44)
AST: 21 U/L (ref 15–41)
Albumin: 4 g/dL (ref 3.5–5.0)
Alkaline Phosphatase: 69 U/L (ref 38–126)
Anion gap: 10 (ref 5–15)
BUN: 37 mg/dL — ABNORMAL HIGH (ref 8–23)
CO2: 25 mmol/L (ref 22–32)
Calcium: 9.4 mg/dL (ref 8.9–10.3)
Chloride: 101 mmol/L (ref 98–111)
Creatinine, Ser: 1.05 mg/dL — ABNORMAL HIGH (ref 0.44–1.00)
GFR calc Af Amer: 54 mL/min — ABNORMAL LOW (ref >60)
GFR calc non Af Amer: 46 mL/min — ABNORMAL LOW (ref >60)
Glucose, Bld: 120 mg/dL — ABNORMAL HIGH (ref 70–99)
Potassium: 4.1 mmol/L (ref 3.5–5.1)
Sodium: 136 mmol/L (ref 135–145)
Total Bilirubin: 0.7 mg/dL (ref 0.3–1.2)
Total Protein: 6.9 g/dL (ref 6.5–8.1)

## 2019-08-31 LAB — CBC
HCT: 35.5 % — ABNORMAL LOW (ref 36.0–46.0)
Hemoglobin: 11.6 g/dL — ABNORMAL LOW (ref 12.0–15.0)
MCH: 33.6 pg (ref 26.0–34.0)
MCHC: 32.7 g/dL (ref 30.0–36.0)
MCV: 102.9 fL — ABNORMAL HIGH (ref 80.0–100.0)
Platelets: 207 10*3/uL (ref 150–400)
RBC: 3.45 MIL/uL — ABNORMAL LOW (ref 3.87–5.11)
RDW: 15.7 % — ABNORMAL HIGH (ref 11.5–15.5)
WBC: 8.9 10*3/uL (ref 4.0–10.5)
nRBC: 0 % (ref 0.0–0.2)

## 2019-08-31 LAB — PROTIME-INR
INR: 2.3 — ABNORMAL HIGH (ref 0.8–1.2)
Prothrombin Time: 24.8 seconds — ABNORMAL HIGH (ref 11.4–15.2)

## 2019-08-31 MED ORDER — HYDROCODONE-ACETAMINOPHEN 5-325 MG PO TABS: 1.0000 | ORAL_TABLET | ORAL | 0 refills | Status: AC | PRN

## 2019-08-31 MED ORDER — IOHEXOL 300 MG/ML  SOLN
75.0000 mL | Freq: Once | INTRAMUSCULAR | Status: AC | PRN
  Administered 2019-08-31: 75 mL via INTRAVENOUS

## 2019-08-31 MED ORDER — HYDROCODONE-ACETAMINOPHEN 5-325 MG PO TABS
2.0000 | ORAL_TABLET | Freq: Once | ORAL | Status: AC
  Administered 2019-08-31: 2 via ORAL
  Filled 2019-08-31: qty 2

## 2019-08-31 NOTE — ED Triage Notes (Signed)
Pt was getting out of bed this morning and fell against a marble table. Denies hitting her head. C/o of right rib pain

## 2019-08-31 NOTE — ED Provider Notes (Signed)
[facility] EMERGENCY DEPARTMENT Provider Note   CSN: [id number] Arrival date & time: 08/31/19  1138     History Chief Complaint  Patient presents with  . Fall    Nancy Hicks is a 86 y.o. female.  The history is provided by the patient. No language interpreter was used.  Fall This is a new problem. The current episode started 6 to 12 hours ago. The problem occurs constantly. The problem has not changed since onset.Associated symptoms include chest pain. Nothing aggravates the symptoms. Nothing relieves the symptoms. She has tried nothing for the symptoms. The treatment provided no relief.   Pt fell getting out of bed and hit a table.  Pt complains of pain in her right ribs. Pt reports pain with breathing     Past Medical History:  Diagnosis Date  . Anemia, unspecified   . Anxiety state, unspecified   . Blood dyscrasia    RV factor def  . Cancer ([place])    skin  . Degeneration of cervical intervertebral disc   . Disorder of bone and cartilage, unspecified   . Diverticulosis of colon (without mention of hemorrhage) 01/13/1999  . Esophageal reflux   . Esophageal stricture   . Factor V deficiency ([place])   . Factor V deficiency ([place])   . Hiatal hernia 04/09/2010   ? Paraesophageal component on chest CT 2006  . HTN (hypertension)   . Irritable bowel syndrome   . Neurogenic bladder   . Osteoarthrosis, unspecified whether generalized or localized, unspecified site   . Peripheral vascular disease (HCC)    pe, dvt hx  . Personal history of thrombophlebitis   . Personal history of venous thrombosis and embolism   . 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 Pure hypercholesterolemia   . Raynaud phenomenon   . Shortness of breath dyspnea   . Unspecified asthma(493.90)   . Unspecified disorder resulting from impaired renal function    cysts    Patient Active Problem List   Diagnosis Date Noted  . Left rib fracture 06/24/2017  . Rib fractures 06/24/2017  .  History of DVT (deep vein thrombosis) 12/07/2016  . Acute pain of right shoulder   . Acute diverticulitis 01/07/2016  . Bright red rectal bleeding 01/07/2016  . Factor V deficiency ([place]) 01/07/2016  . CKD (chronic kidney disease) 01/07/2016  . Diverticulitis 01/07/2016  . Sigmoid diverticulitis 01/07/2016  . Diverticulitis of large intestine without perforation or abscess with bleeding   . Marginal zone lymphoma of extranodal and solid organ sites [facility]) 09/09/2015  . Hx pulmonary embolism 04/23/2015  . H/O superficial parotidectomy 11/14/2014  . Deep vein thrombosis of lower extremity ([place]) 07/27/2011  . Embolism, pulmonary with infarction ([place]) 07/27/2011  . Diverticulitis of colon (without mention of hemorrhage)(562.11) 06/09/2011  . GERD (gastroesophageal reflux disease) 06/09/2011  . Right rib fracture 06/09/2011  . Anticoagulant long-term use 06/09/2011  . COPD with chronic bronchitis ([place]) 06/09/2011  . HH (hiatus hernia) 11/04/2010  . Dysphagia 03/11/2010  . RENAL INSUFFICIENCY 04/02/2008  . IBS 01/24/2008  . HYPERCHOLESTEROLEMIA 03/14/2007  . ASTHMA 03/14/2007  . NEUROGENIC BLADDER 03/14/2007  . [place] DISEASE, CERVICAL 03/14/2007  . DEEP VENOUS THROMBOPHLEBITIS, HX OF 03/14/2007  . ANEMIA 01/10/2007  . ANXIETY 01/10/2007  . Essential hypertension 01/10/2007  . Colon, diverticulosis 01/10/2007  . DEGENERATIVE JOINT DISEASE 01/10/2007  . OSTEOPENIA 01/10/2007    Past Surgical History:  Procedure Laterality Date  . CATARACT EXTRACTION Bilateral   . CHOLECYSTECTOMY    .  COLONOSCOPY  2000   Dr. Verl Blalock: moderate to severe sigmoid diverticulosis  . COLONOSCOPY  03/2015   Dr. Peggye Form at [facility]: TCS for abnormal sigmoid colon on PET. Moderate diverticulosis of the sigmoid colon, sessile 8-9 mm sigmoid: Polyp biopsied (given anticoagulation), two 3 mm ICV polyps removed. All polyps were tubular adenomas.  . ESOPHAGEAL MANOMETRY  10/26/2011   Procedure: ESOPHAGEAL  MANOMETRY (EM);  Surgeon: Sable Feil, MD;  [facility];  Service: Endoscopy;  Laterality: N/A;  . ESOPHAGOGASTRODUODENOSCOPY  03/2010   Dr. Verl Blalock: candida esophagitis, prolapsing hh with cameron lesions, multiple polyps  . EYE SURGERY Bilateral    cataracts  . Flexible sigmoidoscopy  05/2015   Dr. Peggye Form at [place]: Performed off anticoagulation. 1-1.5 cm sessile sigmoid polyp removed, site tattooed. Pathology revealed tubular adenoma. Colonoscopy in 3-5 years as medically appropriate.  Marland Kitchen PAROTIDECTOMY Left 11/14/2014  . PAROTIDECTOMY Left 11/14/2014   Procedure: LEFT LATERAL PAROTIDECTOMY;  Surgeon: Leta Baptist, MD;  Location: [place];  Service: ENT;  Laterality: Left;  . PARS PLANA VITRECTOMY Right 04/15/2018   Procedure: PARS PLANA VITRECTOMY WITH 25 GAUGE;  Surgeon: Bernarda Caffey, MD;  Location: [place];  Service: Ophthalmology;  Laterality: Right;  . PHOTOCOAGULATION WITH LASER Right 04/15/2018   Procedure: PHOTOCOAGULATION WITH LASER;  Surgeon: Bernarda Caffey, MD;  Location: [place];  Service: Ophthalmology;  Laterality: Right;  . 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 TONSILLECTOMY       OB History    Gravida  3   Para  2   Term  2   Preterm      AB  1   Living        SAB  1   TAB      Ectopic      Multiple      Live Births              Family History  Problem Relation Age of Onset  . Parkinsonism Mother   . COPD Brother   . Asthma Brother   . Lung cancer Brother   . Heart attack Brother   . Diabetes Paternal Aunt     Social History   Tobacco Use  . Smoking status: Never Smoker  . Smokeless tobacco: Never Used  Substance Use Topics  . Alcohol use: No  . Drug use: No    Home Medications Prior to Admission medications   Medication Sig Start Date End Date Taking? Authorizing Provider  ADVAIR DISKUS 100-50 MCG/DOSE AEPB take 1 tablet twice a day Patient taking differently: Inhale 1 puff into the lungs 2 (two) times daily.   01/01/17   Noralee Space, MD  allopurinol (ZYLOPRIM) 300 MG tablet Take 300 mg by mouth daily.      [provider]  atropine 1 % ophthalmic solution Place 1 drop into the right eye 2 (two) times daily.    [provider]  bacitracin-polymyxin b (POLYSPORIN) ophthalmic ointment Place 1 application into the right eye at bedtime. 04/27/18   Bernarda Caffey, MD  Biotin 5000 MCG CAPS Take 1 capsule by mouth daily.    [provider]  CARTIA XT 300 MG 24 hr capsule Take 300 mg by mouth daily.  02/03/18   [provider]  chlorthalidone (HYGROTON) 25 MG tablet Take 25 mg by mouth daily.    [provider]  Cholecalciferol (VITAMIN D3) 2000 UNITS TABS Take 1 tablet by mouth daily.  [provider]  erythromycin ophthalmic ointment Place 1 application into the right eye at bedtime. 04/28/18   Bernarda Caffey, MD  esomeprazole (NEXIUM) 40 MG capsule Take 40 mg by mouth daily before breakfast.  06/09/11   Sable Feil, MD  fluticasone [facility]) 50 MCG/ACT nasal spray instill 2 sprays into each nostril once daily Patient taking differently: Place 2 sprays into both nostrils daily as needed for allergies or rhinitis.  04/14/17   Noralee Space, MD  gatifloxacin (ZYMAXID) 0.5 % SOLN Place 1 drop into the right eye 4 (four) times daily.    [provider]  HYDROcodone-acetaminophen (NORCO/VICODIN) 5-325 MG tablet Take 1 tablet by mouth every 4 (four) hours as needed. 08/31/19   Fransico Meadow, PA-C  losartan (COZAAR) 100 MG tablet Take 100 mg by mouth daily.  02/23/18   [provider]  methenamine (HIPREX) 1 G tablet Take 500 mg by mouth 2 (two) times daily with a meal.  04/11/11   [provider]  methylcellulose (CITRUCEL) oral powder Take 1 packet by mouth 3 (three) times daily. Take 1 tsp by mouth daily     [provider]  Multiple Vitamin (MULITIVITAMIN WITH MINERALS) TABS Take 1 tablet by mouth daily.    [provider]  Multiple Vitamins-Minerals (OCUVITE-LUTEIN PO) Take 1 tablet by mouth daily.    [provider]  Polyethyl Glycol-Propyl Glycol (SYSTANE ULTRA PF) 0.4-0.3 % SOLN Place 2 drops into both eyes 3 (three) times daily.     [provider]  prednisoLONE acetate (PRED FORTE) 1 % ophthalmic suspension Place 1 drop into the right eye every hour. 08/18/18   Bernarda Caffey, MD  prednisoLONE acetate (PRED FORTE) 1 % ophthalmic suspension Place 1 drop into the right eye every hour while awake. 08/18/18   Bernarda Caffey, MD  sertraline (ZOLOFT) 50 MG tablet Take 50 mg by mouth at bedtime.  03/03/18   [provider]  simvastatin (ZOCOR) 10 MG tablet Take 10 mg by mouth at bedtime.      [provider]  VENTOLIN HFA 108 (90 BASE) MCG/ACT inhaler inhale 2 puffs by mouth every 6 hours AS NEEDED FOR WHEEZING Patient taking differently: Inhale 2 puffs into the lungs every 6 (six) hours as needed for wheezing or shortness of breath.  11/09/14   Noralee Space, MD  warfarin (COUMADIN) 2.5 MG tablet Take 2.5 mg by mouth every evening. Takes 2.5 mg everyday.    [provider]    Allergies    Metoclopramide hcl and Nitrofurantoin  Review of Systems   Review of Systems  Cardiovascular: Positive for chest pain.  All other systems reviewed and are negative.   Physical Exam Updated Vital Signs BP (!) 134/59   Pulse 73   Temp 97.7 F (36.5 C) (Oral)   Resp 16   Ht 5' (1.524 m)   Wt 57.2 kg   SpO2 91%   BMI 24.61 kg/m   Physical Exam Vitals and nursing note reviewed.  Constitutional:      Appearance: She is well-developed.  HENT:     Head: Normocephalic.  Cardiovascular:     Rate and Rhythm: Normal rate and regular rhythm.  Pulmonary:     Effort: Pulmonary effort is normal.  Abdominal:     General: There is no distension.  Musculoskeletal:        General: Normal range of motion.     Cervical back: Normal range of motion.  Skin:  General:  Skin is warm.  Neurological:     General: No focal deficit present.     Mental Status: She is alert and oriented to person, place, and time.  Psychiatric:        Mood and Affect: Mood normal.     ED Results / Procedures / Treatments   Labs (all labs ordered are listed, but only abnormal results are displayed) Labs Reviewed  CBC - Abnormal; Notable for the following components:      Result Value   RBC 3.45 (*)    Hemoglobin 11.6 (*)    HCT 35.5 (*)    MCV 102.9 (*)    RDW 15.7 (*)    All other components within normal limits  COMPREHENSIVE METABOLIC PANEL - Abnormal; Notable for the following components:   Glucose, Bld 120 (*)    BUN 37 (*)    Creatinine, Ser 1.05 (*)    GFR calc non Af Amer 46 (*)    GFR calc Af Amer 54 (*)    All other components within normal limits  PROTIME-INR - Abnormal; Notable for the following components:   Prothrombin Time 24.8 (*)    INR 2.3 (*)    All other components within normal limits    EKG None  Radiology DG Ribs Unilateral W/Chest Right  Result Date: 08/31/2019 CLINICAL DATA:  Right-sided rib pain after fall EXAM: RIGHT RIBS AND CHEST - 3+ VIEW COMPARISON:  12/07/2017 FINDINGS: No fracture or other bone lesions are seen involving the ribs. There is no evidence of pneumothorax or pleural effusion. Stable cardiomediastinal contours with atherosclerotic aorta. Large hiatal hernia. Dextroscoliotic thoracic curvature. IMPRESSION: 1. Negative right rib series. 2. Large hiatal hernia. Electronically Signed   By: Davina Poke D.O.   On: 08/31/2019 12:26   CT CHEST W CONTRAST  Result Date: 08/31/2019 CLINICAL DATA:  86 year old female fell out of bed. Trauma to the right side of the chest. EXAM: CT CHEST, ABDOMEN, AND PELVIS WITH CONTRAST TECHNIQUE: Multidetector CT imaging of the chest, abdomen and pelvis was performed following the standard protocol during bolus administration of intravenous contrast. CONTRAST:  34mL OMNIPAQUE IOHEXOL 300  MG/ML  SOLN COMPARISON:  Chest radiograph dated 08/31/2019. FINDINGS: CT CHEST FINDINGS Cardiovascular: There is no cardiomegaly or pericardial effusion. Three-vessel coronary vascular calcification and calcification of the mitral annulus. There is moderate atherosclerotic calcification of the thoracic aorta. No aneurysmal dilatation or dissection. The central pulmonary arteries are unremarkable. Mediastinum/Nodes: There is no hilar or mediastinal adenopathy. There is a large hiatal hernia containing the majority of the stomach. The esophagus is grossly unremarkable. No mediastinal fluid collection. Lungs/Pleura: Left lung base atelectasis secondary to a large left diaphragmatic hernia. There is no pleural effusion or pneumothorax. The central airways are patent. Musculoskeletal: Osteopenia with degenerative changes of the spine. Old healed left posterior rib fractures. No definite acute osseous pathology. No displaced rib fracture. CT ABDOMEN PELVIS FINDINGS No intra-abdominal free air or free fluid. Hepatobiliary: The liver is unremarkable. No intrahepatic biliary ductal dilatation. Cholecystectomy. No retained calcified stone noted in the central CBD. Pancreas: Unremarkable. No pancreatic ductal dilatation or surrounding inflammatory changes. Spleen: Normal in size without focal abnormality. Adrenals/Urinary Tract: The adrenal glands unremarkable. Ill-defined 2 cm lesion in the interpolar aspect of the left kidney, not characterized, possibly a complex cyst. Several additional cysts noted. There is no hydronephrosis on either side. There is symmetric enhancement and excretion of contrast by both kidneys. The visualized ureters and urinary bladder appear unremarkable. Stomach/Bowel:  There is severe sigmoid diverticulosis without active inflammatory changes. There is no bowel obstruction or active inflammation. No evidence of acute appendicitis. Vascular/Lymphatic: Advanced aortoiliac atherosclerotic disease. The  IVC is unremarkable. No portal venous gas. There is no adenopathy. Reproductive: The uterus and ovaries are grossly unremarkable. Other: None Musculoskeletal: Osteopenia with severe degenerative changes of the spine. No acute osseous pathology. IMPRESSION: 1. No acute/traumatic intrathoracic, abdominal, or pelvic pathology. 2. Large hiatal hernia containing the majority of the stomach. 3. Severe sigmoid diverticulosis. 4. Aortic Atherosclerosis (ICD10-I70.0). Electronically Signed   By: Anner Crete M.D.   On: 08/31/2019 16:47   CT ABDOMEN PELVIS W CONTRAST  Result Date: 08/31/2019 CLINICAL DATA:  86 year old female fell out of bed. Trauma to the right side of the chest. EXAM: CT CHEST, ABDOMEN, AND PELVIS WITH CONTRAST TECHNIQUE: Multidetector CT imaging of the chest, abdomen and pelvis was performed following the standard protocol during bolus administration of intravenous contrast. CONTRAST:  82mL OMNIPAQUE IOHEXOL 300 MG/ML  SOLN COMPARISON:  Chest radiograph dated 08/31/2019. FINDINGS: CT CHEST FINDINGS Cardiovascular: There is no cardiomegaly or pericardial effusion. Three-vessel coronary vascular calcification and calcification of the mitral annulus. There is moderate atherosclerotic calcification of the thoracic aorta. No aneurysmal dilatation or dissection. The central pulmonary arteries are unremarkable. Mediastinum/Nodes: There is no hilar or mediastinal adenopathy. There is a large hiatal hernia containing the majority of the stomach. The esophagus is grossly unremarkable. No mediastinal fluid collection. Lungs/Pleura: Left lung base atelectasis secondary to a large left diaphragmatic hernia. There is no pleural effusion or pneumothorax. The central airways are patent. Musculoskeletal: Osteopenia with degenerative changes of the spine. Old healed left posterior rib fractures. No definite acute osseous pathology. No displaced rib fracture. CT ABDOMEN PELVIS FINDINGS No intra-abdominal free air or  free fluid. Hepatobiliary: The liver is unremarkable. No intrahepatic biliary ductal dilatation. Cholecystectomy. No retained calcified stone noted in the central CBD. Pancreas: Unremarkable. No pancreatic ductal dilatation or surrounding inflammatory changes. Spleen: Normal in size without focal abnormality. Adrenals/Urinary Tract: The adrenal glands unremarkable. Ill-defined 2 cm lesion in the interpolar aspect of the left kidney, not characterized, possibly a complex cyst. Several additional cysts noted. There is no hydronephrosis on either side. There is symmetric enhancement and excretion of contrast by both kidneys. The visualized ureters and urinary bladder appear unremarkable. Stomach/Bowel: There is severe sigmoid diverticulosis without active inflammatory changes. There is no bowel obstruction or active inflammation. No evidence of acute appendicitis. Vascular/Lymphatic: Advanced aortoiliac atherosclerotic disease. The IVC is unremarkable. No portal venous gas. There is no adenopathy. Reproductive: The uterus and ovaries are grossly unremarkable. Other: None Musculoskeletal: Osteopenia with severe degenerative changes of the spine. No acute osseous pathology. IMPRESSION: 1. No acute/traumatic intrathoracic, abdominal, or pelvic pathology. 2. Large hiatal hernia containing the majority of the stomach. 3. Severe sigmoid diverticulosis. 4. Aortic Atherosclerosis (ICD10-I70.0). Electronically Signed   By: Anner Crete M.D.   On: 08/31/2019 16:47    Procedures Procedures (including critical care time)  Medications Ordered in ED Medications  HYDROcodone-acetaminophen (NORCO/VICODIN) 5-325 MG per tablet 2 tablet (2 tablets Oral Given 08/31/19 1442)  iohexol (OMNIPAQUE) 300 MG/ML solution 75 mL (75 mLs Intravenous Contrast Given 08/31/19 1554)    ED Course  I have reviewed the triage vital signs and the nursing notes.  Pertinent labs & imaging results that were available during my care of the  patient were reviewed by me and considered in my medical decision making (see chart for details).    MDM Rules/Calculators/A&P  MDM:  Rib xray no fracture, Pt very tender over her right upper quadrant of her abdomen  Pt given 2 hydrocodone for pain.  Ct chest and abdomen no fracture no abdominal injury Final Clinical Impression(s) / ED Diagnoses Final diagnoses:  Contusion of right chest wall, initial encounter    Rx / DC Orders ED Discharge Orders         Ordered    HYDROcodone-acetaminophen (NORCO/VICODIN) 5-325 MG tablet  Every 4 hours PRN     08/31/19 1732        An After Visit Summary was printed and given to the patient.    Fransico Meadow, [place] 08/31/19 1739    Maudie Flakes, MD 09/04/19 1311

## 2019-08-31 NOTE — Discharge Instructions (Addendum)
Return if any problems.

## 2019-09-06 DIAGNOSIS — Z7901 Long term (current) use of anticoagulants: Secondary | ICD-10-CM | POA: Diagnosis not present

## 2019-09-18 DIAGNOSIS — N183 Chronic kidney disease, stage 3 unspecified: Secondary | ICD-10-CM | POA: Diagnosis not present

## 2019-09-18 DIAGNOSIS — K219 Gastro-esophageal reflux disease without esophagitis: Secondary | ICD-10-CM | POA: Diagnosis not present

## 2019-09-18 DIAGNOSIS — Z79899 Other long term (current) drug therapy: Secondary | ICD-10-CM | POA: Diagnosis not present

## 2019-09-18 DIAGNOSIS — M109 Gout, unspecified: Secondary | ICD-10-CM | POA: Diagnosis not present

## 2019-09-18 DIAGNOSIS — I10 Essential (primary) hypertension: Secondary | ICD-10-CM | POA: Diagnosis not present

## 2019-09-18 DIAGNOSIS — E785 Hyperlipidemia, unspecified: Secondary | ICD-10-CM | POA: Diagnosis not present

## 2019-09-18 DIAGNOSIS — F329 Major depressive disorder, single episode, unspecified: Secondary | ICD-10-CM | POA: Diagnosis not present

## 2019-09-18 DIAGNOSIS — R7301 Impaired fasting glucose: Secondary | ICD-10-CM | POA: Diagnosis not present

## 2019-09-18 DIAGNOSIS — Z7901 Long term (current) use of anticoagulants: Secondary | ICD-10-CM | POA: Diagnosis not present

## 2019-10-17 DIAGNOSIS — L565 Disseminated superficial actinic porokeratosis (DSAP): Secondary | ICD-10-CM | POA: Diagnosis not present

## 2019-10-17 DIAGNOSIS — B372 Candidiasis of skin and nail: Secondary | ICD-10-CM | POA: Diagnosis not present

## 2019-10-17 DIAGNOSIS — Z85828 Personal history of other malignant neoplasm of skin: Secondary | ICD-10-CM | POA: Diagnosis not present

## 2019-10-17 DIAGNOSIS — L821 Other seborrheic keratosis: Secondary | ICD-10-CM | POA: Diagnosis not present

## 2019-10-17 DIAGNOSIS — D225 Melanocytic nevi of trunk: Secondary | ICD-10-CM | POA: Diagnosis not present

## 2019-10-18 ENCOUNTER — Telehealth: Payer: Self-pay | Admitting: Urology

## 2019-10-18 DIAGNOSIS — Z7901 Long term (current) use of anticoagulants: Secondary | ICD-10-CM | POA: Diagnosis not present

## 2019-10-18 NOTE — Telephone Encounter (Signed)
Pt requests refill on mcphenomine(spelling?) [facility] on [address]

## 2019-10-19 ENCOUNTER — Other Ambulatory Visit: Payer: Self-pay

## 2019-10-19 DIAGNOSIS — Z6827 Body mass index (BMI) 27.0-27.9, adult: Secondary | ICD-10-CM | POA: Diagnosis not present

## 2019-10-19 DIAGNOSIS — I10 Essential (primary) hypertension: Secondary | ICD-10-CM | POA: Diagnosis not present

## 2019-10-19 DIAGNOSIS — I82491 Acute embolism and thrombosis of other specified deep vein of right lower extremity: Secondary | ICD-10-CM | POA: Diagnosis not present

## 2019-10-19 DIAGNOSIS — J4520 Mild intermittent asthma, uncomplicated: Secondary | ICD-10-CM | POA: Diagnosis not present

## 2019-10-19 DIAGNOSIS — Z0001 Encounter for general adult medical examination with abnormal findings: Secondary | ICD-10-CM | POA: Diagnosis not present

## 2019-10-19 DIAGNOSIS — M109 Gout, unspecified: Secondary | ICD-10-CM | POA: Diagnosis not present

## 2019-10-19 DIAGNOSIS — Z8572 Personal history of non-Hodgkin lymphomas: Secondary | ICD-10-CM | POA: Diagnosis not present

## 2019-10-19 DIAGNOSIS — N3020 Other chronic cystitis without hematuria: Secondary | ICD-10-CM

## 2019-10-19 MED ORDER — METHENAMINE HIPPURATE 1 G PO TABS: 500.0000 mg | ORAL_TABLET | Freq: Two times a day (BID) | ORAL | 5 refills | Status: DC

## 2019-10-19 NOTE — Telephone Encounter (Signed)
Rx refill of methenamine sent to pharmacy

## 2019-10-31 DIAGNOSIS — H30031 Focal chorioretinal inflammation, peripheral, right eye: Secondary | ICD-10-CM | POA: Diagnosis not present

## 2019-10-31 DIAGNOSIS — Z79899 Other long term (current) drug therapy: Secondary | ICD-10-CM | POA: Diagnosis not present

## 2019-10-31 DIAGNOSIS — T8522XD Displacement of intraocular lens, subsequent encounter: Secondary | ICD-10-CM | POA: Diagnosis not present

## 2019-10-31 DIAGNOSIS — H35372 Puckering of macula, left eye: Secondary | ICD-10-CM | POA: Diagnosis not present

## 2019-10-31 DIAGNOSIS — Z5181 Encounter for therapeutic drug level monitoring: Secondary | ICD-10-CM | POA: Diagnosis not present

## 2019-10-31 DIAGNOSIS — Z961 Presence of intraocular lens: Secondary | ICD-10-CM | POA: Diagnosis not present

## 2019-10-31 DIAGNOSIS — H4311 Vitreous hemorrhage, right eye: Secondary | ICD-10-CM | POA: Diagnosis not present

## 2019-10-31 DIAGNOSIS — H3581 Retinal edema: Secondary | ICD-10-CM | POA: Diagnosis not present

## 2019-11-22 DIAGNOSIS — Z7901 Long term (current) use of anticoagulants: Secondary | ICD-10-CM | POA: Diagnosis not present

## 2019-12-18 DIAGNOSIS — K219 Gastro-esophageal reflux disease without esophagitis: Secondary | ICD-10-CM | POA: Diagnosis not present

## 2019-12-18 DIAGNOSIS — R143 Flatulence: Secondary | ICD-10-CM | POA: Diagnosis not present

## 2019-12-18 DIAGNOSIS — Z86010 Personal history of colonic polyps: Secondary | ICD-10-CM | POA: Diagnosis not present

## 2019-12-18 DIAGNOSIS — Z79899 Other long term (current) drug therapy: Secondary | ICD-10-CM | POA: Diagnosis not present

## 2019-12-18 DIAGNOSIS — Z7901 Long term (current) use of anticoagulants: Secondary | ICD-10-CM | POA: Diagnosis not present

## 2019-12-18 DIAGNOSIS — Z8719 Personal history of other diseases of the digestive system: Secondary | ICD-10-CM | POA: Diagnosis not present

## 2019-12-19 DIAGNOSIS — Z7901 Long term (current) use of anticoagulants: Secondary | ICD-10-CM | POA: Diagnosis not present

## 2019-12-28 ENCOUNTER — Ambulatory Visit: Payer: Medicare PPO

## 2020-01-09 DIAGNOSIS — Z23 Encounter for immunization: Secondary | ICD-10-CM | POA: Diagnosis not present

## 2020-01-16 DIAGNOSIS — Z7901 Long term (current) use of anticoagulants: Secondary | ICD-10-CM | POA: Diagnosis not present

## 2020-01-30 DIAGNOSIS — Z79899 Other long term (current) drug therapy: Secondary | ICD-10-CM | POA: Diagnosis not present

## 2020-01-30 DIAGNOSIS — H4311 Vitreous hemorrhage, right eye: Secondary | ICD-10-CM | POA: Diagnosis not present

## 2020-01-30 DIAGNOSIS — T8522XD Displacement of intraocular lens, subsequent encounter: Secondary | ICD-10-CM | POA: Diagnosis not present

## 2020-01-30 DIAGNOSIS — H30031 Focal chorioretinal inflammation, peripheral, right eye: Secondary | ICD-10-CM | POA: Diagnosis not present

## 2020-01-30 DIAGNOSIS — H3581 Retinal edema: Secondary | ICD-10-CM | POA: Diagnosis not present

## 2020-01-30 DIAGNOSIS — H35372 Puckering of macula, left eye: Secondary | ICD-10-CM | POA: Diagnosis not present

## 2020-01-30 DIAGNOSIS — Z961 Presence of intraocular lens: Secondary | ICD-10-CM | POA: Diagnosis not present

## 2020-02-27 DIAGNOSIS — Z7901 Long term (current) use of anticoagulants: Secondary | ICD-10-CM | POA: Diagnosis not present

## 2020-03-27 DIAGNOSIS — Z7901 Long term (current) use of anticoagulants: Secondary | ICD-10-CM | POA: Diagnosis not present

## 2020-04-22 DIAGNOSIS — Z7901 Long term (current) use of anticoagulants: Secondary | ICD-10-CM | POA: Diagnosis not present

## 2020-04-22 DIAGNOSIS — I2690 Septic pulmonary embolism without acute cor pulmonale: Secondary | ICD-10-CM | POA: Diagnosis not present

## 2020-04-22 DIAGNOSIS — I10 Essential (primary) hypertension: Secondary | ICD-10-CM | POA: Diagnosis not present

## 2020-04-22 DIAGNOSIS — I82403 Acute embolism and thrombosis of unspecified deep veins of lower extremity, bilateral: Secondary | ICD-10-CM | POA: Diagnosis not present

## 2020-04-23 DIAGNOSIS — H35372 Puckering of macula, left eye: Secondary | ICD-10-CM | POA: Diagnosis not present

## 2020-04-23 DIAGNOSIS — H3581 Retinal edema: Secondary | ICD-10-CM | POA: Diagnosis not present

## 2020-04-23 DIAGNOSIS — Z79899 Other long term (current) drug therapy: Secondary | ICD-10-CM | POA: Diagnosis not present

## 2020-04-23 DIAGNOSIS — Z961 Presence of intraocular lens: Secondary | ICD-10-CM | POA: Diagnosis not present

## 2020-04-23 DIAGNOSIS — H30031 Focal chorioretinal inflammation, peripheral, right eye: Secondary | ICD-10-CM | POA: Diagnosis not present

## 2020-04-23 DIAGNOSIS — T8522XD Displacement of intraocular lens, subsequent encounter: Secondary | ICD-10-CM | POA: Diagnosis not present

## 2020-04-23 DIAGNOSIS — H4311 Vitreous hemorrhage, right eye: Secondary | ICD-10-CM | POA: Diagnosis not present

## 2020-05-06 DIAGNOSIS — Z86010 Personal history of colonic polyps: Secondary | ICD-10-CM | POA: Diagnosis not present

## 2020-05-06 DIAGNOSIS — K219 Gastro-esophageal reflux disease without esophagitis: Secondary | ICD-10-CM | POA: Diagnosis not present

## 2020-05-21 DIAGNOSIS — Z7901 Long term (current) use of anticoagulants: Secondary | ICD-10-CM | POA: Diagnosis not present

## 2020-06-18 DIAGNOSIS — Z79899 Other long term (current) drug therapy: Secondary | ICD-10-CM | POA: Diagnosis not present

## 2020-06-18 DIAGNOSIS — H35372 Puckering of macula, left eye: Secondary | ICD-10-CM | POA: Diagnosis not present

## 2020-06-18 DIAGNOSIS — Z961 Presence of intraocular lens: Secondary | ICD-10-CM | POA: Diagnosis not present

## 2020-06-18 DIAGNOSIS — T8522XD Displacement of intraocular lens, subsequent encounter: Secondary | ICD-10-CM | POA: Diagnosis not present

## 2020-06-18 DIAGNOSIS — H30031 Focal chorioretinal inflammation, peripheral, right eye: Secondary | ICD-10-CM | POA: Diagnosis not present

## 2020-06-18 DIAGNOSIS — H4311 Vitreous hemorrhage, right eye: Secondary | ICD-10-CM | POA: Diagnosis not present

## 2020-06-18 DIAGNOSIS — H3581 Retinal edema: Secondary | ICD-10-CM | POA: Diagnosis not present

## 2020-06-19 DIAGNOSIS — Z7901 Long term (current) use of anticoagulants: Secondary | ICD-10-CM | POA: Diagnosis not present

## 2020-07-16 DIAGNOSIS — Z7901 Long term (current) use of anticoagulants: Secondary | ICD-10-CM | POA: Diagnosis not present

## 2020-07-23 DIAGNOSIS — Z20822 Contact with and (suspected) exposure to covid-19: Secondary | ICD-10-CM | POA: Diagnosis not present

## 2020-08-15 DIAGNOSIS — Z7901 Long term (current) use of anticoagulants: Secondary | ICD-10-CM | POA: Diagnosis not present

## 2020-08-20 DIAGNOSIS — H30031 Focal chorioretinal inflammation, peripheral, right eye: Secondary | ICD-10-CM | POA: Diagnosis not present

## 2020-08-20 DIAGNOSIS — H4311 Vitreous hemorrhage, right eye: Secondary | ICD-10-CM | POA: Diagnosis not present

## 2020-08-20 DIAGNOSIS — Z79899 Other long term (current) drug therapy: Secondary | ICD-10-CM | POA: Diagnosis not present

## 2020-08-20 DIAGNOSIS — H35372 Puckering of macula, left eye: Secondary | ICD-10-CM | POA: Diagnosis not present

## 2020-08-20 DIAGNOSIS — H3581 Retinal edema: Secondary | ICD-10-CM | POA: Diagnosis not present

## 2020-08-20 DIAGNOSIS — Z961 Presence of intraocular lens: Secondary | ICD-10-CM | POA: Diagnosis not present

## 2020-08-20 DIAGNOSIS — T8522XD Displacement of intraocular lens, subsequent encounter: Secondary | ICD-10-CM | POA: Diagnosis not present

## 2020-09-18 DIAGNOSIS — Z7901 Long term (current) use of anticoagulants: Secondary | ICD-10-CM | POA: Diagnosis not present

## 2020-10-10 ENCOUNTER — Other Ambulatory Visit: Payer: Self-pay | Admitting: Urology

## 2020-10-10 DIAGNOSIS — N3020 Other chronic cystitis without hematuria: Secondary | ICD-10-CM

## 2020-10-15 ENCOUNTER — Other Ambulatory Visit: Payer: Self-pay

## 2020-10-15 DIAGNOSIS — E785 Hyperlipidemia, unspecified: Secondary | ICD-10-CM | POA: Diagnosis not present

## 2020-10-15 DIAGNOSIS — N183 Chronic kidney disease, stage 3 unspecified: Secondary | ICD-10-CM | POA: Diagnosis not present

## 2020-10-15 DIAGNOSIS — N3020 Other chronic cystitis without hematuria: Secondary | ICD-10-CM

## 2020-10-15 DIAGNOSIS — Z79899 Other long term (current) drug therapy: Secondary | ICD-10-CM | POA: Diagnosis not present

## 2020-10-15 DIAGNOSIS — K219 Gastro-esophageal reflux disease without esophagitis: Secondary | ICD-10-CM | POA: Diagnosis not present

## 2020-10-15 DIAGNOSIS — C8590 Non-Hodgkin lymphoma, unspecified, unspecified site: Secondary | ICD-10-CM | POA: Diagnosis not present

## 2020-10-15 DIAGNOSIS — I10 Essential (primary) hypertension: Secondary | ICD-10-CM | POA: Diagnosis not present

## 2020-10-15 DIAGNOSIS — F329 Major depressive disorder, single episode, unspecified: Secondary | ICD-10-CM | POA: Diagnosis not present

## 2020-10-15 DIAGNOSIS — R7301 Impaired fasting glucose: Secondary | ICD-10-CM | POA: Diagnosis not present

## 2020-10-15 DIAGNOSIS — M109 Gout, unspecified: Secondary | ICD-10-CM | POA: Diagnosis not present

## 2020-10-15 MED ORDER — METHENAMINE HIPPURATE 1 G PO TABS: 500.0000 mg | ORAL_TABLET | Freq: Two times a day (BID) | ORAL | 0 refills | Status: DC

## 2020-10-16 DIAGNOSIS — L821 Other seborrheic keratosis: Secondary | ICD-10-CM | POA: Diagnosis not present

## 2020-10-16 DIAGNOSIS — D225 Melanocytic nevi of trunk: Secondary | ICD-10-CM | POA: Diagnosis not present

## 2020-10-16 DIAGNOSIS — L570 Actinic keratosis: Secondary | ICD-10-CM | POA: Diagnosis not present

## 2020-10-16 DIAGNOSIS — Z85828 Personal history of other malignant neoplasm of skin: Secondary | ICD-10-CM | POA: Diagnosis not present

## 2020-10-22 DIAGNOSIS — Z8572 Personal history of non-Hodgkin lymphomas: Secondary | ICD-10-CM | POA: Diagnosis not present

## 2020-10-22 DIAGNOSIS — Z0000 Encounter for general adult medical examination without abnormal findings: Secondary | ICD-10-CM | POA: Diagnosis not present

## 2020-10-22 DIAGNOSIS — I10 Essential (primary) hypertension: Secondary | ICD-10-CM | POA: Diagnosis not present

## 2020-10-22 DIAGNOSIS — I82401 Acute embolism and thrombosis of unspecified deep veins of right lower extremity: Secondary | ICD-10-CM | POA: Diagnosis not present

## 2020-10-22 DIAGNOSIS — N1831 Chronic kidney disease, stage 3a: Secondary | ICD-10-CM | POA: Diagnosis not present

## 2020-10-22 DIAGNOSIS — Z6827 Body mass index (BMI) 27.0-27.9, adult: Secondary | ICD-10-CM | POA: Diagnosis not present

## 2020-10-22 DIAGNOSIS — D696 Thrombocytopenia, unspecified: Secondary | ICD-10-CM | POA: Diagnosis not present

## 2020-10-22 DIAGNOSIS — J45901 Unspecified asthma with (acute) exacerbation: Secondary | ICD-10-CM | POA: Diagnosis not present

## 2020-10-29 DIAGNOSIS — H30031 Focal chorioretinal inflammation, peripheral, right eye: Secondary | ICD-10-CM | POA: Diagnosis not present

## 2020-10-29 DIAGNOSIS — H4311 Vitreous hemorrhage, right eye: Secondary | ICD-10-CM | POA: Diagnosis not present

## 2020-10-29 DIAGNOSIS — H35372 Puckering of macula, left eye: Secondary | ICD-10-CM | POA: Diagnosis not present

## 2020-10-29 DIAGNOSIS — T8522XD Displacement of intraocular lens, subsequent encounter: Secondary | ICD-10-CM | POA: Diagnosis not present

## 2020-10-29 DIAGNOSIS — Z79899 Other long term (current) drug therapy: Secondary | ICD-10-CM | POA: Diagnosis not present

## 2020-10-29 DIAGNOSIS — H3581 Retinal edema: Secondary | ICD-10-CM | POA: Diagnosis not present

## 2020-10-29 DIAGNOSIS — Z961 Presence of intraocular lens: Secondary | ICD-10-CM | POA: Diagnosis not present

## 2020-11-13 DIAGNOSIS — Z7901 Long term (current) use of anticoagulants: Secondary | ICD-10-CM | POA: Diagnosis not present

## 2020-11-19 ENCOUNTER — Ambulatory Visit: Payer: Medicare PPO | Admitting: Urology

## 2020-11-19 ENCOUNTER — Other Ambulatory Visit: Payer: Self-pay

## 2020-11-19 ENCOUNTER — Encounter: Payer: Self-pay | Admitting: Urology

## 2020-11-19 VITALS — BP 135/70 | HR 86

## 2020-11-19 DIAGNOSIS — N3020 Other chronic cystitis without hematuria: Secondary | ICD-10-CM | POA: Diagnosis not present

## 2020-11-19 LAB — URINALYSIS, ROUTINE W REFLEX MICROSCOPIC
Bilirubin, UA: NEGATIVE
Glucose, UA: NEGATIVE
Ketones, UA: NEGATIVE
Leukocytes,UA: NEGATIVE
Nitrite, UA: NEGATIVE
Protein,UA: NEGATIVE
RBC, UA: NEGATIVE
Specific Gravity, UA: 1.015 (ref 1.005–1.030)
Urobilinogen, Ur: 0.2 mg/dL (ref 0.2–1.0)
pH, UA: 6 (ref 5.0–7.5)

## 2020-11-19 MED ORDER — METHENAMINE HIPPURATE 1 G PO TABS: 500.0000 mg | ORAL_TABLET | Freq: Two times a day (BID) | ORAL | 11 refills | Status: DC

## 2020-11-19 NOTE — Progress Notes (Signed)
History of Present Illness: Nancy Hicks is a 87 y.o. year old female returns for follow-up of recurrent urinary tract infections.  She is on methenamine 500 mg twice a day.  She has tolerated this well.  She has not had any real issues with urinary tract infections over the past year.  She does not always have a good stream and she feels like she has to strain a little bit at the end, but she has no leakage.  No gross hematuria, dysuria, no foul-smelling or cloudy urine.  Past Medical History:  Diagnosis Date   Anemia, unspecified    Anxiety state, unspecified    Blood dyscrasia    RV factor def   Cancer ([place])    skin   Degeneration of cervical intervertebral disc    Disorder of bone and cartilage, unspecified    Diverticulosis of colon (without mention of hemorrhage) 01/13/1999   Esophageal reflux    Esophageal stricture    Factor V deficiency ([place])    Factor V deficiency ([place])    Hiatal hernia 04/09/2010   ? 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 Verl Blalock: moderate to severe sigmoid diverticulosis   COLONOSCOPY  03/2015   Dr. Peggye Form at [facility]: TCS for abnormal sigmoid colon on PET. Moderate diverticulosis of the sigmoid colon, sessile 8-9 mm sigmoid: Polyp biopsied (given anticoagulation), two 3 mm ICV  polyps removed. All polyps were tubular adenomas.   ESOPHAGEAL MANOMETRY  10/26/2011   Procedure: ESOPHAGEAL MANOMETRY (EM);  Surgeon: Sable Feil, MD;  [facility];  Service: Endoscopy;  Laterality: N/A;   ESOPHAGOGASTRODUODENOSCOPY  03/2010   Dr. Verl Blalock: candida esophagitis, prolapsing hh with cameron lesions, multiple polyps   EYE SURGERY Bilateral    cataracts   Flexible sigmoidoscopy  05/2015   Dr. Peggye Form at [facility]: Performed off anticoagulation. 1-1.5 cm sessile sigmoid polyp removed, site tattooed. Pathology revealed tubular adenoma. Colonoscopy in 3-5 years as medically appropriate.   PAROTIDECTOMY Left 11/14/2014   PAROTIDECTOMY Left 11/14/2014   Procedure: LEFT LATERAL PAROTIDECTOMY;  Surgeon: Leta Baptist, MD;  Location: [place] [facility];  Service: ENT;  Laterality: Left;   PARS PLANA VITRECTOMY Right 04/15/2018   Procedure: PARS PLANA VITRECTOMY WITH 25 GAUGE;  Surgeon: Bernarda Caffey, MD;  Location: [place];  Service: Ophthalmology;  Laterality: Right;   PHOTOCOAGULATION WITH LASER Right 04/15/2018   Procedure: PHOTOCOAGULATION WITH LASER;  Surgeon: Bernarda Caffey, MD;  Location: [place];  Service: Ophthalmology;  Laterality: Right;   salivery glad tumor  80's   right   TONSILLECTOMY      Home Medications:  (Not in a hospital admission)   Allergies:  Allergies  Allergen Reactions   Metoclopramide Hcl Other (See Comments)    REACTION:  causes uncontrolable leg movements   Nitrofurantoin Nausea And Vomiting and Other (See Comments)    REACTION: dizziness    Family History  Problem Relation Age of Onset   Parkinsonism Mother    COPD Brother    Asthma Brother    Lung cancer Brother    Heart attack Brother    Diabetes Paternal Aunt     Social History:  reports that she has never smoked. She has never used smokeless tobacco. She reports that she does not drink alcohol and does not use drugs.  ROS: A complete review of systems was performed.  All systems are  negative except for pertinent findings as noted.  Physical Exam:  Vital signs in last 24 hours: '@VSRANGES'$ @ General:  Alert and oriented, No acute distress HEENT: Normocephalic, atraumatic Neck: No JVD or lymphadenopathy Cardiovascular: Regular rate  Lungs: Normal inspiratory/expiratory excursion Abdomen: Soft, nontender, nondistended, no abdominal masses Back: No CVA tenderness Extremities: No edema Neurologic: Grossly intact  I have reviewed prior pt notes  I have reviewed notes from referring/previous physicians  I have reviewed urinalysis results--urine clear today  Bladder scan revealed no residual urine volume.      Impression/Assessment:  Recurrent urinary tract infections, doing well with current suppression  Plan:  I will have her come back every couple of years for check.  Lillette Boxer Ameri Cahoon 11/19/2020, 8:17 AM  Lillette Boxer. [facility]star Toft MD

## 2020-11-19 NOTE — Progress Notes (Signed)
Urological Symptom Review  Patient is experiencing the following symptoms: Stream starts and stops Trouble starting stream Have to strain to urinate Weak stream   Review of Systems  Gastrointestinal (upper)  : Indigestion/heartburn  Gastrointestinal (lower) : Negative for lower GI symptoms  Constitutional : Fatigue  Skin: Negative for skin symptoms  Eyes: Negative for eye symptoms  Ear/Nose/Throat : Negative for Ear/Nose/Throat symptoms  Hematologic/Lymphatic: Easy bruising  Cardiovascular : Negative for cardiovascular symptoms  Respiratory : Shortness of breath  Endocrine: Negative for endocrine symptoms  Musculoskeletal: Back pain Joint pain  Neurological: Negative for neurological symptoms  Psychologic: Negative for psychiatric symptoms

## 2020-11-25 ENCOUNTER — Telehealth: Payer: Self-pay | Admitting: Cardiovascular Disease

## 2020-11-25 NOTE — Telephone Encounter (Signed)
Did not need this encounter °

## 2020-11-26 ENCOUNTER — Encounter: Payer: Self-pay | Admitting: Orthopaedic Surgery

## 2020-11-26 ENCOUNTER — Ambulatory Visit: Payer: Medicare PPO | Admitting: Orthopaedic Surgery

## 2020-11-26 ENCOUNTER — Ambulatory Visit: Payer: Medicare PPO

## 2020-11-26 ENCOUNTER — Other Ambulatory Visit: Payer: Self-pay

## 2020-11-26 VITALS — BP 142/68 | HR 79 | Ht <= 58 in | Wt 126.0 lb

## 2020-11-26 DIAGNOSIS — M25562 Pain in left knee: Secondary | ICD-10-CM

## 2020-11-26 DIAGNOSIS — M25511 Pain in right shoulder: Secondary | ICD-10-CM

## 2020-11-26 DIAGNOSIS — G8929 Other chronic pain: Secondary | ICD-10-CM

## 2020-11-26 DIAGNOSIS — Z7901 Long term (current) use of anticoagulants: Secondary | ICD-10-CM | POA: Diagnosis not present

## 2020-11-26 NOTE — Progress Notes (Signed)
My right shoulder hurts and my left knee hurts.  She has right shoulder pain over the last six weeks to two months getting worse.  She has no trauma.  She has pain raising her hand overhead.  She has no numbness. She has no neck pain.  She has tried ice, heat and Tylenol.  She is on a blood thinner.  She has chronic left knee pain. She has no trauma.  She has swelling and popping but no giving way.  Examination of right Upper Extremity is done.  Inspection:   Overall:  Elbow non-tender without crepitus or defects, forearm non-tender without crepitus or defects, wrist non-tender without crepitus or defects, hand non-tender.    Shoulder: with glenohumeral joint tenderness, without effusion.   Upper arm:  without swelling and tenderness   Range of motion:   Overall:  Full range of motion of the elbow, full range of motion of wrist and full range of motion in fingers.   Shoulder:  right  120 degrees forward flexion; 90 degrees abduction; 25 degrees internal rotation, 25 degrees external rotation, 10 degrees extension, 40 degrees adduction.   Stability:   Overall:  Shoulder, elbow and wrist stable   Strength and Tone:   Overall full shoulder muscles strength, full upper arm strength and normal upper arm bulk and tone.   Left knee with effusion, crepitus, ROM 0 to 105, limp left.  NV intact.  Encounter Diagnoses  Name Primary?   Chronic right shoulder pain Yes   Chronic pain of left knee    Anticoagulant long-term use    X-rays were done of the right shoulder, reported separately.  PROCEDURE NOTE:  The patient requests injections of the left knee , verbal consent was obtained.  The left knee was prepped appropriately after time out was performed.   Sterile technique was observed and injection of 1 cc of Celestone 6 mg with several cc's of plain xylocaine. Anesthesia was provided by ethyl chloride and a 20-gauge needle was used to inject the knee area. The injection was tolerated well.   A band aid dressing was applied.  The patient was advised to apply ice later today and tomorrow to the injection sight as needed.   PROCEDURE NOTE:  The patient request injection, verbal consent was obtained.  The right shoulder was prepped appropriately after time out was performed.   Sterile technique was observed and injection of 1 cc of Celestone 6 mg with several cc's of plain xylocaine. Anesthesia was provided by ethyl chloride and a 20-gauge needle was used to inject the shoulder area. A posterior approach was used.  The injection was tolerated well.  A band aid dressing was applied.  The patient was advised to apply ice later today and tomorrow to the injection sight as needed.   Return in one month.  Call if any problem.  Precautions discussed.  Electronically Signed Sanjuana Kava, MD 8/30/202210:23 AM

## 2020-12-10 DIAGNOSIS — Z7901 Long term (current) use of anticoagulants: Secondary | ICD-10-CM | POA: Diagnosis not present

## 2020-12-24 ENCOUNTER — Encounter: Payer: Self-pay | Admitting: Orthopaedic Surgery

## 2020-12-24 ENCOUNTER — Other Ambulatory Visit: Payer: Self-pay

## 2020-12-24 ENCOUNTER — Ambulatory Visit: Payer: Medicare PPO | Admitting: Orthopaedic Surgery

## 2020-12-24 DIAGNOSIS — G8929 Other chronic pain: Secondary | ICD-10-CM

## 2020-12-24 DIAGNOSIS — M25562 Pain in left knee: Secondary | ICD-10-CM

## 2020-12-24 DIAGNOSIS — Z7901 Long term (current) use of anticoagulants: Secondary | ICD-10-CM

## 2020-12-24 NOTE — Progress Notes (Signed)
Her right shoulder is doing much better.  Her left knee is tender and she would like an injection. She uses a cane.  PROCEDURE NOTE:  The patient requests injections of the left knee , verbal consent was obtained.  The left knee was prepped appropriately after time out was performed.   Sterile technique was observed and injection of 1 cc of Celestone 6 mg with several cc's of plain xylocaine. Anesthesia was provided by ethyl chloride and a 20-gauge needle was used to inject the knee area. The injection was tolerated well.  A band aid dressing was applied.  The patient was advised to apply ice later today and tomorrow to the injection sight as needed.   Encounter Diagnoses  Name Primary?   Chronic pain of left knee Yes   Anticoagulant long-term use    Return in six weeks.  Call if any problem.  Precautions discussed.  Electronically Signed Sanjuana Kava, MD 9/27/202210:51 AM

## 2021-01-07 DIAGNOSIS — Z23 Encounter for immunization: Secondary | ICD-10-CM | POA: Diagnosis not present

## 2021-01-08 DIAGNOSIS — Z7901 Long term (current) use of anticoagulants: Secondary | ICD-10-CM | POA: Diagnosis not present

## 2021-01-24 DIAGNOSIS — Z79899 Other long term (current) drug therapy: Secondary | ICD-10-CM | POA: Diagnosis not present

## 2021-01-24 DIAGNOSIS — H30031 Focal chorioretinal inflammation, peripheral, right eye: Secondary | ICD-10-CM | POA: Diagnosis not present

## 2021-01-24 DIAGNOSIS — H4311 Vitreous hemorrhage, right eye: Secondary | ICD-10-CM | POA: Diagnosis not present

## 2021-01-24 DIAGNOSIS — Z961 Presence of intraocular lens: Secondary | ICD-10-CM | POA: Diagnosis not present

## 2021-01-24 DIAGNOSIS — H35372 Puckering of macula, left eye: Secondary | ICD-10-CM | POA: Diagnosis not present

## 2021-01-24 DIAGNOSIS — H3581 Retinal edema: Secondary | ICD-10-CM | POA: Diagnosis not present

## 2021-01-24 DIAGNOSIS — T8522XD Displacement of intraocular lens, subsequent encounter: Secondary | ICD-10-CM | POA: Diagnosis not present

## 2021-02-04 ENCOUNTER — Encounter: Payer: Self-pay | Admitting: Orthopaedic Surgery

## 2021-02-04 ENCOUNTER — Other Ambulatory Visit: Payer: Self-pay

## 2021-02-04 ENCOUNTER — Ambulatory Visit: Payer: Medicare PPO | Admitting: Orthopaedic Surgery

## 2021-02-04 VITALS — BP 142/68 | HR 92 | Ht 60.0 in | Wt 129.0 lb

## 2021-02-04 DIAGNOSIS — Z7901 Long term (current) use of anticoagulants: Secondary | ICD-10-CM

## 2021-02-04 DIAGNOSIS — M25511 Pain in right shoulder: Secondary | ICD-10-CM

## 2021-02-04 DIAGNOSIS — G8929 Other chronic pain: Secondary | ICD-10-CM | POA: Diagnosis not present

## 2021-02-04 DIAGNOSIS — M25562 Pain in left knee: Secondary | ICD-10-CM

## 2021-02-04 NOTE — Progress Notes (Signed)
PROCEDURE NOTE:  The patient requests injections of the left knee , verbal consent was obtained.  The left knee was prepped appropriately after time out was performed.   Sterile technique was observed and injection of 1 cc of DepoMedrol 40 mg with several cc's of plain xylocaine. Anesthesia was provided by ethyl chloride and a 20-gauge needle was used to inject the knee area. The injection was tolerated well.  A band aid dressing was applied.  The patient was advised to apply ice later today and tomorrow to the injection sight as needed.   PROCEDURE NOTE:  The patient request injection, verbal consent was obtained.  The right shoulder was prepped appropriately after time out was performed.   Sterile technique was observed and injection of 1 cc of DepoMedrol 40mg  with several cc's of plain xylocaine. Anesthesia was provided by ethyl chloride and a 20-gauge needle was used to inject the shoulder area. A posterior approach was used.  The injection was tolerated well.  A band aid dressing was applied.  The patient was advised to apply ice later today and tomorrow to the injection sight as needed.    Encounter Diagnoses  Name Primary?   Chronic pain of left knee Yes   Chronic right shoulder pain    Anticoagulant long-term use    Return in two months.  Call if any problem.  Precautions discussed.  Electronically Signed Sanjuana Kava, MD 11/8/202210:32 AM

## 2021-02-25 DIAGNOSIS — Z7901 Long term (current) use of anticoagulants: Secondary | ICD-10-CM | POA: Diagnosis not present

## 2021-03-28 DIAGNOSIS — Z7901 Long term (current) use of anticoagulants: Secondary | ICD-10-CM | POA: Diagnosis not present

## 2021-04-02 DIAGNOSIS — Z7901 Long term (current) use of anticoagulants: Secondary | ICD-10-CM | POA: Diagnosis not present

## 2021-04-08 ENCOUNTER — Ambulatory Visit: Payer: Medicare PPO | Admitting: Orthopaedic Surgery

## 2021-04-22 DIAGNOSIS — I10 Essential (primary) hypertension: Secondary | ICD-10-CM | POA: Diagnosis not present

## 2021-04-22 DIAGNOSIS — U099 Post covid-19 condition, unspecified: Secondary | ICD-10-CM | POA: Diagnosis not present

## 2021-04-22 DIAGNOSIS — D6869 Other thrombophilia: Secondary | ICD-10-CM | POA: Diagnosis not present

## 2021-04-24 DIAGNOSIS — H04123 Dry eye syndrome of bilateral lacrimal glands: Secondary | ICD-10-CM | POA: Diagnosis not present

## 2021-05-05 DIAGNOSIS — Z7901 Long term (current) use of anticoagulants: Secondary | ICD-10-CM | POA: Diagnosis not present

## 2021-05-06 DIAGNOSIS — H3581 Retinal edema: Secondary | ICD-10-CM | POA: Diagnosis not present

## 2021-05-06 DIAGNOSIS — T8522XD Displacement of intraocular lens, subsequent encounter: Secondary | ICD-10-CM | POA: Diagnosis not present

## 2021-05-06 DIAGNOSIS — Z79899 Other long term (current) drug therapy: Secondary | ICD-10-CM | POA: Diagnosis not present

## 2021-05-06 DIAGNOSIS — Z961 Presence of intraocular lens: Secondary | ICD-10-CM | POA: Diagnosis not present

## 2021-05-06 DIAGNOSIS — H35372 Puckering of macula, left eye: Secondary | ICD-10-CM | POA: Diagnosis not present

## 2021-05-06 DIAGNOSIS — H30031 Focal chorioretinal inflammation, peripheral, right eye: Secondary | ICD-10-CM | POA: Diagnosis not present

## 2021-06-03 ENCOUNTER — Other Ambulatory Visit: Payer: Self-pay

## 2021-06-03 ENCOUNTER — Ambulatory Visit: Payer: Medicare PPO | Admitting: Orthopaedic Surgery

## 2021-06-03 ENCOUNTER — Encounter: Payer: Self-pay | Admitting: Orthopaedic Surgery

## 2021-06-03 VITALS — Ht 60.0 in | Wt 125.0 lb

## 2021-06-03 DIAGNOSIS — G8929 Other chronic pain: Secondary | ICD-10-CM

## 2021-06-03 DIAGNOSIS — M25511 Pain in right shoulder: Secondary | ICD-10-CM

## 2021-06-03 DIAGNOSIS — Z7901 Long term (current) use of anticoagulants: Secondary | ICD-10-CM

## 2021-06-03 DIAGNOSIS — M25562 Pain in left knee: Secondary | ICD-10-CM

## 2021-06-03 NOTE — Progress Notes (Signed)
PROCEDURE NOTE: ? ?The patient request injection, verbal consent was obtained. ? ?The right shoulder was prepped appropriately after time out was performed.  ? ?Sterile technique was observed and injection of 1 cc of DepoMedrol '40mg'$  with several cc's of plain xylocaine. Anesthesia was provided by ethyl chloride and a 20-gauge needle was used to inject the shoulder area. A posterior approach was used.  The injection was tolerated well. ? ?A band aid dressing was applied. ? ?The patient was advised to apply ice later today and tomorrow to the injection sight as needed. ? ?PROCEDURE NOTE: ? ?The patient requests injections of the left knee , verbal consent was obtained. ? ?The left knee was prepped appropriately after time out was performed.  ? ?Sterile technique was observed and injection of 1 cc of DepoMedrol 40 mg with several cc's of plain xylocaine. Anesthesia was provided by ethyl chloride and a 20-gauge needle was used to inject the knee area. The injection was tolerated well.  A band aid dressing was applied. ? ?The patient was advised to apply ice later today and tomorrow to the injection sight as needed. ? ?Encounter Diagnoses  ?Name Primary?  ? Chronic pain of left knee Yes  ? Chronic right shoulder pain   ? Anticoagulant long-term use   ? ?Return in six weeks. ? ?Call if any problem. ? ?Precautions discussed. ? ?Electronically Signed ?Sanjuana Kava, MD ?3/7/202310:30 AM ? ?

## 2021-07-02 DIAGNOSIS — Z7901 Long term (current) use of anticoagulants: Secondary | ICD-10-CM | POA: Diagnosis not present

## 2021-07-15 ENCOUNTER — Ambulatory Visit: Payer: Medicare PPO | Admitting: Orthopaedic Surgery

## 2021-07-15 DIAGNOSIS — D696 Thrombocytopenia, unspecified: Secondary | ICD-10-CM | POA: Diagnosis not present

## 2021-07-15 DIAGNOSIS — I10 Essential (primary) hypertension: Secondary | ICD-10-CM | POA: Diagnosis not present

## 2021-07-15 DIAGNOSIS — I480 Paroxysmal atrial fibrillation: Secondary | ICD-10-CM | POA: Diagnosis not present

## 2021-07-29 DIAGNOSIS — Z961 Presence of intraocular lens: Secondary | ICD-10-CM | POA: Diagnosis not present

## 2021-07-29 DIAGNOSIS — H4311 Vitreous hemorrhage, right eye: Secondary | ICD-10-CM | POA: Diagnosis not present

## 2021-07-29 DIAGNOSIS — Z79899 Other long term (current) drug therapy: Secondary | ICD-10-CM | POA: Diagnosis not present

## 2021-07-29 DIAGNOSIS — H3581 Retinal edema: Secondary | ICD-10-CM | POA: Diagnosis not present

## 2021-07-29 DIAGNOSIS — H35372 Puckering of macula, left eye: Secondary | ICD-10-CM | POA: Diagnosis not present

## 2021-07-29 DIAGNOSIS — H30031 Focal chorioretinal inflammation, peripheral, right eye: Secondary | ICD-10-CM | POA: Diagnosis not present

## 2021-07-31 DIAGNOSIS — Z7901 Long term (current) use of anticoagulants: Secondary | ICD-10-CM | POA: Diagnosis not present

## 2021-08-28 DIAGNOSIS — Z7901 Long term (current) use of anticoagulants: Secondary | ICD-10-CM | POA: Diagnosis not present

## 2021-09-25 DIAGNOSIS — Z7901 Long term (current) use of anticoagulants: Secondary | ICD-10-CM | POA: Diagnosis not present

## 2021-10-20 DIAGNOSIS — Z79899 Other long term (current) drug therapy: Secondary | ICD-10-CM | POA: Diagnosis not present

## 2021-10-20 DIAGNOSIS — E785 Hyperlipidemia, unspecified: Secondary | ICD-10-CM | POA: Diagnosis not present

## 2021-10-20 DIAGNOSIS — I10 Essential (primary) hypertension: Secondary | ICD-10-CM | POA: Diagnosis not present

## 2021-10-20 DIAGNOSIS — C8590 Non-Hodgkin lymphoma, unspecified, unspecified site: Secondary | ICD-10-CM | POA: Diagnosis not present

## 2021-10-20 DIAGNOSIS — R7301 Impaired fasting glucose: Secondary | ICD-10-CM | POA: Diagnosis not present

## 2021-10-20 DIAGNOSIS — N1831 Chronic kidney disease, stage 3a: Secondary | ICD-10-CM | POA: Diagnosis not present

## 2021-10-20 DIAGNOSIS — J45909 Unspecified asthma, uncomplicated: Secondary | ICD-10-CM | POA: Diagnosis not present

## 2021-10-20 DIAGNOSIS — F329 Major depressive disorder, single episode, unspecified: Secondary | ICD-10-CM | POA: Diagnosis not present

## 2021-10-20 DIAGNOSIS — M109 Gout, unspecified: Secondary | ICD-10-CM | POA: Diagnosis not present

## 2021-10-27 DIAGNOSIS — Z8572 Personal history of non-Hodgkin lymphomas: Secondary | ICD-10-CM | POA: Diagnosis not present

## 2021-10-27 DIAGNOSIS — F325 Major depressive disorder, single episode, in full remission: Secondary | ICD-10-CM | POA: Diagnosis not present

## 2021-10-27 DIAGNOSIS — I10 Essential (primary) hypertension: Secondary | ICD-10-CM | POA: Diagnosis not present

## 2021-10-27 DIAGNOSIS — D6869 Other thrombophilia: Secondary | ICD-10-CM | POA: Diagnosis not present

## 2021-10-27 DIAGNOSIS — I82401 Acute embolism and thrombosis of unspecified deep veins of right lower extremity: Secondary | ICD-10-CM | POA: Diagnosis not present

## 2021-10-27 DIAGNOSIS — N1831 Chronic kidney disease, stage 3a: Secondary | ICD-10-CM | POA: Diagnosis not present

## 2021-10-27 DIAGNOSIS — I480 Paroxysmal atrial fibrillation: Secondary | ICD-10-CM | POA: Diagnosis not present

## 2021-11-11 DIAGNOSIS — Z961 Presence of intraocular lens: Secondary | ICD-10-CM | POA: Diagnosis not present

## 2021-11-11 DIAGNOSIS — H3581 Retinal edema: Secondary | ICD-10-CM | POA: Diagnosis not present

## 2021-11-11 DIAGNOSIS — H35372 Puckering of macula, left eye: Secondary | ICD-10-CM | POA: Diagnosis not present

## 2021-11-11 DIAGNOSIS — Z79899 Other long term (current) drug therapy: Secondary | ICD-10-CM | POA: Diagnosis not present

## 2021-11-11 DIAGNOSIS — H4311 Vitreous hemorrhage, right eye: Secondary | ICD-10-CM | POA: Diagnosis not present

## 2021-11-11 DIAGNOSIS — H30031 Focal chorioretinal inflammation, peripheral, right eye: Secondary | ICD-10-CM | POA: Diagnosis not present

## 2021-11-11 DIAGNOSIS — T8522XD Displacement of intraocular lens, subsequent encounter: Secondary | ICD-10-CM | POA: Diagnosis not present

## 2021-11-12 ENCOUNTER — Ambulatory Visit (INDEPENDENT_AMBULATORY_CARE_PROVIDER_SITE_OTHER): Payer: Medicare PPO | Admitting: Orthopaedic Surgery

## 2021-11-12 ENCOUNTER — Encounter: Payer: Self-pay | Admitting: Orthopaedic Surgery

## 2021-11-12 DIAGNOSIS — M25562 Pain in left knee: Secondary | ICD-10-CM

## 2021-11-12 DIAGNOSIS — Z7901 Long term (current) use of anticoagulants: Secondary | ICD-10-CM | POA: Diagnosis not present

## 2021-11-12 DIAGNOSIS — G8929 Other chronic pain: Secondary | ICD-10-CM | POA: Diagnosis not present

## 2021-11-12 DIAGNOSIS — M25511 Pain in right shoulder: Secondary | ICD-10-CM | POA: Diagnosis not present

## 2021-11-12 MED ORDER — METHYLPREDNISOLONE ACETATE 40 MG/ML IJ SUSP
40.0000 mg | Freq: Once | INTRAMUSCULAR | Status: AC
  Administered 2021-11-12: 40 mg via INTRA_ARTICULAR

## 2021-11-12 NOTE — Addendum Note (Signed)
Addended by: Obie Dredge A on: 11/12/2021 02:32 PM   Modules accepted: Orders

## 2021-11-12 NOTE — Progress Notes (Signed)
PROCEDURE NOTE:  The patient request injection, verbal consent was obtained.  The right shoulder was prepped appropriately after time out was performed.   Sterile technique was observed and injection of 1 cc of DepoMedrol '40mg'$  with several cc's of plain xylocaine. Anesthesia was provided by ethyl chloride and a 20-gauge needle was used to inject the shoulder area. A posterior approach was used.  The injection was tolerated well.  A band aid dressing was applied.  The patient was advised to apply ice later today and tomorrow to the injection sight as needed.  PROCEDURE NOTE:  The patient requests injections of the left knee , verbal consent was obtained.  The left knee was prepped appropriately after time out was performed.   Sterile technique was observed and injection of 1 cc of DepoMedrol 40 mg with several cc's of plain xylocaine. Anesthesia was provided by ethyl chloride and a 20-gauge needle was used to inject the knee area. The injection was tolerated well.  A band aid dressing was applied.  The patient was advised to apply ice later today and tomorrow to the injection sight as needed.  Encounter Diagnoses  Name Primary?   Chronic pain of left knee Yes   Chronic right shoulder pain    Anticoagulant long-term use    Return prn.  Call if any problem.  Precautions discussed.  Electronically Signed Sanjuana Kava, MD 8/16/202310:47 AM

## 2021-12-02 ENCOUNTER — Other Ambulatory Visit: Payer: Self-pay | Admitting: Urology

## 2021-12-02 DIAGNOSIS — N3020 Other chronic cystitis without hematuria: Secondary | ICD-10-CM

## 2021-12-03 ENCOUNTER — Encounter: Payer: Self-pay | Admitting: *Deleted

## 2021-12-03 ENCOUNTER — Telehealth: Payer: Self-pay | Admitting: *Deleted

## 2021-12-03 NOTE — Patient Instructions (Signed)
Visit Information  Thank you for taking time to visit with me today. Please don't hesitate to contact me if I can be of assistance to you.  Following are the goals we discussed today:  Continue monitoring BP. Schedule Annual Wellness Visit with PCP if not already done.  Please call the Suicide and Crisis Lifeline: 988 call the [place] National Suicide Prevention Lifeline: 279-502-0480 or TTY: (320)368-6412 TTY 4326927074) to talk to a trained counselor call 1-800-273-TALK (toll free, 24 hour hotline) call the [facility]: [phone] if you are experiencing a [place] or [place] or need someone to talk to.  The patient verbalized understanding of instructions, educational materials, and care plan provided today and agreed to receive a mailed copy of patient instructions, educational materials, and care plan.   The patient has been provided with contact information for the care management team and has been advised to call with any health related questions or concerns.   Valente David, RN, MSN, [place] [facility] Care Management Coordinator [phone]

## 2021-12-03 NOTE — Patient Outreach (Signed)
  Care Coordination   Initial Visit Note   12/03/2021 Name: Nancy Hicks MRN: [id number] DOB: 08-13-1926  Nancy Hicks is a 88 y.o. year old female who sees Asencion Noble, MD for primary care. I spoke with  Nancy Hicks by phone today.  What matters to the patients health and wellness today?  Report she is doing very well.  Remains independent, family visits daily.  Denies any needs currently but will call with concerns. State she think AWV was about a month ago.    Goals Addressed             This Visit's Progress    COMPLETED: Care Coordination Activities - No follow up needed       Care Coordination Interventions: Patient interviewed about adult health maintenance status including  Colonoscopy    Depression screen    Falls risk assessment    Pneumonia Vaccine Influenza Vaccine Regular eye checkups Blood Pressure    Advised patient to discuss  Pneumonia Vaccine Influenza Vaccine COVID vaccination    with primary care provider  Provided education about Need for AWV with PCP if not done SDOH reviewed         SDOH assessments and interventions completed:  Yes  SDOH Interventions Today    Flowsheet Row Most Recent Value  SDOH Interventions   Food Insecurity Interventions Intervention Not Indicated  Housing Interventions Intervention Not Indicated  Transportation Interventions Intervention Not Indicated  Utilities Interventions Intervention Not Indicated        Care Coordination Interventions Activated:  Yes  Care Coordination Interventions:  Yes, provided   Follow up plan: No further intervention required.   Encounter Outcome:  Pt. Visit Completed   Valente David, RN, MSN, [place] Care Management Care Management Coordinator [phone]

## 2021-12-05 ENCOUNTER — Telehealth: Payer: Self-pay

## 2021-12-05 NOTE — Telephone Encounter (Signed)
Pharmacy requested refill of Hiprex, I tried to send refill but the order is pended.  Can you please send in refill if appropriate.

## 2021-12-08 ENCOUNTER — Other Ambulatory Visit: Payer: Self-pay

## 2021-12-08 DIAGNOSIS — N3020 Other chronic cystitis without hematuria: Secondary | ICD-10-CM

## 2021-12-08 MED ORDER — METHENAMINE HIPPURATE 1 G PO TABS: 500.0000 mg | ORAL_TABLET | Freq: Two times a day (BID) | ORAL | 11 refills | Status: DC

## 2021-12-08 NOTE — Telephone Encounter (Signed)
Rx refill sent to pharmacy. 

## 2021-12-08 NOTE — Progress Notes (Signed)
Refill sent to pharmacy.   

## 2021-12-10 DIAGNOSIS — Z7901 Long term (current) use of anticoagulants: Secondary | ICD-10-CM | POA: Diagnosis not present

## 2021-12-15 DIAGNOSIS — H903 Sensorineural hearing loss, bilateral: Secondary | ICD-10-CM | POA: Diagnosis not present

## 2021-12-19 ENCOUNTER — Emergency Department (HOSPITAL_COMMUNITY)
Admission: EM | Admit: 2021-12-19 | Discharge: 2021-12-19 | Disposition: A | Discharge status: Home / Self Care | Payer: Medicare PPO | Attending: Emergency Medicine | Admitting: Emergency Medicine

## 2021-12-19 ENCOUNTER — Other Ambulatory Visit: Payer: Self-pay

## 2021-12-19 ENCOUNTER — Emergency Department (HOSPITAL_COMMUNITY): Payer: Medicare PPO

## 2021-12-19 ENCOUNTER — Encounter (HOSPITAL_COMMUNITY): Payer: Self-pay

## 2021-12-19 DIAGNOSIS — I6782 Cerebral ischemia: Secondary | ICD-10-CM | POA: Diagnosis not present

## 2021-12-19 DIAGNOSIS — S0083XA Contusion of other part of head, initial encounter: Secondary | ICD-10-CM | POA: Insufficient documentation

## 2021-12-19 DIAGNOSIS — S0990XA Unspecified injury of head, initial encounter: Secondary | ICD-10-CM

## 2021-12-19 DIAGNOSIS — Z7901 Long term (current) use of anticoagulants: Secondary | ICD-10-CM | POA: Diagnosis not present

## 2021-12-19 DIAGNOSIS — W01198A Fall on same level from slipping, tripping and stumbling with subsequent striking against other object, initial encounter: Secondary | ICD-10-CM | POA: Diagnosis not present

## 2021-12-19 DIAGNOSIS — G9389 Other specified disorders of brain: Secondary | ICD-10-CM | POA: Diagnosis not present

## 2021-12-19 DIAGNOSIS — R220 Localized swelling, mass and lump, head: Secondary | ICD-10-CM | POA: Diagnosis not present

## 2021-12-19 DIAGNOSIS — G319 Degenerative disease of nervous system, unspecified: Secondary | ICD-10-CM | POA: Diagnosis not present

## 2021-12-19 NOTE — Discharge Instructions (Signed)
Take Tylenol for pain and follow-up as needed

## 2021-12-19 NOTE — ED Triage Notes (Signed)
Pt presents to ED from home with son c/o fall earlier this evening, pt says she was bent over and when she came up, lost balance and fell backwards hitting head on bed post, pt has hematoma to head (top, left) pt takes Coumadin. No LOC, nausea, vomiting, or dizziness reported. Skin intact. Pt ambulatory with cane.

## 2021-12-19 NOTE — ED Notes (Signed)
Called Nancy Hicks in [place] to inform pt is ready for head CT and is currently taking blood thinner.

## 2021-12-19 NOTE — ED Provider Notes (Signed)
[facility] Provider Note   CSN: [id number] Arrival date & time: 12/19/21  1939     History {Add pertinent medical, surgical, social history, OB history to HPI:1} Chief Complaint  Patient presents with   Fall    Hit head    Nancy Hicks is a 88 y.o. female.  Patient had a fall and hit her head.  No loss of consciousness.  Patient has a history of venous thrombosis and is on a blood thinner.   Fall       Home Medications Prior to Admission medications   Medication Sig Start Date End Date Taking? Authorizing Provider  allopurinol (ZYLOPRIM) 300 MG tablet Take 300 mg by mouth daily.      [provider]  atropine 1 % ophthalmic solution Place 1 drop into the right eye 2 (two) times daily.    [provider]  Biotin 5000 MCG CAPS Take 1 capsule by mouth daily.    [provider]  Bromfenac Sodium 0.09 % SOLN Place 1 drop into the right eye 2 times daily. 10/29/20   [provider]  CARTIA XT 300 MG 24 hr capsule Take 300 mg by mouth daily.  02/03/18   [provider]  chlorthalidone (HYGROTON) 25 MG tablet Take 25 mg by mouth daily.    [provider]  Cholecalciferol (VITAMIN D3) 2000 UNITS TABS Take 1 tablet by mouth daily.      [provider]  esomeprazole (NEXIUM) 40 MG capsule Take 40 mg by mouth daily before breakfast.  06/09/11   Sable Feil, MD  fluticasone [facility]) 50 MCG/ACT nasal spray instill 2 sprays into each nostril once daily Patient taking differently: Place 2 sprays into both nostrils daily as needed for allergies or rhinitis. 04/14/17   Noralee Space, MD  folic acid (FOLVITE) 1 MG tablet Take by mouth. 01/30/20   [provider]  HYDROcodone-acetaminophen (NORCO/VICODIN) 5-325 MG tablet Take 1 tablet by mouth every 4 (four) hours as needed. 08/31/19   Fransico Meadow, PA-C  losartan (COZAAR) 100 MG tablet Take 100 mg by mouth daily.  02/23/18   [provider]  methenamine (HIPREX) 1 g tablet Take 0.5 tablets (0.5 g total) by mouth 2 (two) times daily with a meal. 12/08/21   Franchot Gallo, MD  methylcellulose oral powder Take 1 packet by mouth 3 (three) times daily. Take 1 tsp by mouth daily     [provider]  Multiple Vitamin (MULITIVITAMIN WITH MINERALS) TABS Take 1 tablet by mouth daily.    [provider]  Multiple Vitamins-Minerals (OCUVITE-LUTEIN PO) Take 1 tablet by mouth daily.    [provider]  mycophenolate (CELLCEPT) 500 MG tablet Take by mouth. 10/29/20   [provider]  Polyethyl Glycol-Propyl Glycol 0.4-0.3 % SOLN Place 2 drops into both eyes 3 (three) times daily.     [provider]  prednisoLONE acetate (PRED FORTE) 1 % ophthalmic suspension Place 1 drop into the right eye every hour. 08/18/18   Bernarda Caffey, MD  prednisoLONE acetate (PRED FORTE) 1 % ophthalmic suspension Place 1 drop into the right eye every hour while awake. 08/18/18   Bernarda Caffey, MD  sertraline (ZOLOFT) 50 MG tablet Take 50 mg by mouth at bedtime.  03/03/18   [provider]  simvastatin (ZOCOR) 10 MG tablet Take 10 mg by mouth at bedtime.      [provider]  VENTOLIN HFA 108 (90 BASE) MCG/ACT inhaler  inhale 2 puffs by mouth every 6 hours AS NEEDED FOR WHEEZING Patient taking differently: Inhale 2 puffs into the lungs every 6 (six) hours as needed for wheezing or shortness of breath. 11/09/14   Noralee Space, MD  warfarin (COUMADIN) 2.5 MG tablet Take 3 mg by mouth every evening. Takes 3 mg everyday.    [provider]      Allergies    Polymyxin b, Atropine, Cephalexin, Metoclopramide hcl, and Nitrofurantoin    Review of Systems   Review of Systems  Physical Exam Updated Vital Signs BP (!) 153/87 (BP Location: Right Arm)   Pulse 80   Temp 98.1 F (36.7 C) (Oral)   Resp 17   Ht 5' (1.524 m)   Wt 57.2 kg   SpO2 97%   BMI 24.61 kg/m  Physical Exam  ED  Results / Procedures / Treatments   Labs (all labs ordered are listed, but only abnormal results are displayed) Labs Reviewed - No data to display  EKG None  Radiology CT Head Wo Contrast  Result Date: 12/19/2021 CLINICAL DATA:  Status post fall. EXAM: CT HEAD WITHOUT CONTRAST TECHNIQUE: Contiguous axial images were obtained from the base of the skull through the vertex without intravenous contrast. RADIATION DOSE REDUCTION: This exam was performed according to the departmental dose-optimization program which includes automated exposure control, adjustment of the mA and/or kV according to patient size and/or use of iterative reconstruction technique. COMPARISON:  April 02, 2018 FINDINGS: Brain: There is mild cerebral atrophy with widening of the extra-axial spaces and ventricular dilatation. There are areas of decreased attenuation within the white matter tracts of the supratentorial brain, consistent with microvascular disease changes. Vascular: No hyperdense vessel or unexpected calcification. Skull: Normal. Negative for fracture or focal lesion. Sinuses/Orbits: No acute finding. Other: Mild scalp soft tissue swelling is seen along the vertex on the right. IMPRESSION: 1. Mild scalp soft tissue swelling along the vertex on the right without evidence of an acute fracture or acute intracranial abnormality. 2. Generalized cerebral atrophy with chronic white matter small vessel ischemic changes. Electronically Signed   By: Virgina Norfolk M.D.   On: 12/19/2021 20:49    Procedures Procedures  {Document cardiac monitor, telemetry assessment procedure when appropriate:1}  Medications Ordered in ED Medications - No data to display  ED Course/ Medical Decision Making/ A&P                           Medical Decision Making Amount and/or Complexity of Data Reviewed Radiology: ordered.   Patient with contusion to head but no intracranial injury.  She will be discharged home  {Document critical  care time when appropriate:1} {Document review of labs and clinical decision tools ie heart score, Chads2Vasc2 etc:1}  {Document your independent review of radiology images, and any outside records:1} {Document your discussion with family members, caretakers, and with consultants:1} {Document social determinants of health affecting pt's care:1} {Document your decision making why or why not admission, treatments were needed:1} Final Clinical Impression(s) / ED Diagnoses Final diagnoses:  Injury of head, initial encounter    Rx / DC Orders ED Discharge Orders     None

## 2021-12-23 DIAGNOSIS — H04123 Dry eye syndrome of bilateral lacrimal glands: Secondary | ICD-10-CM | POA: Diagnosis not present

## 2021-12-23 DIAGNOSIS — Z23 Encounter for immunization: Secondary | ICD-10-CM | POA: Diagnosis not present

## 2022-01-07 DIAGNOSIS — Z7901 Long term (current) use of anticoagulants: Secondary | ICD-10-CM | POA: Diagnosis not present

## 2022-02-04 DIAGNOSIS — Z7901 Long term (current) use of anticoagulants: Secondary | ICD-10-CM | POA: Diagnosis not present

## 2022-02-24 DIAGNOSIS — Z961 Presence of intraocular lens: Secondary | ICD-10-CM | POA: Diagnosis not present

## 2022-02-24 DIAGNOSIS — H4311 Vitreous hemorrhage, right eye: Secondary | ICD-10-CM | POA: Diagnosis not present

## 2022-02-24 DIAGNOSIS — Z79899 Other long term (current) drug therapy: Secondary | ICD-10-CM | POA: Diagnosis not present

## 2022-02-24 DIAGNOSIS — H30031 Focal chorioretinal inflammation, peripheral, right eye: Secondary | ICD-10-CM | POA: Diagnosis not present

## 2022-02-24 DIAGNOSIS — T8522XD Displacement of intraocular lens, subsequent encounter: Secondary | ICD-10-CM | POA: Diagnosis not present

## 2022-02-24 DIAGNOSIS — H3581 Retinal edema: Secondary | ICD-10-CM | POA: Diagnosis not present

## 2022-02-24 DIAGNOSIS — H35372 Puckering of macula, left eye: Secondary | ICD-10-CM | POA: Diagnosis not present

## 2022-03-04 DIAGNOSIS — Z7901 Long term (current) use of anticoagulants: Secondary | ICD-10-CM | POA: Diagnosis not present

## 2022-03-16 DIAGNOSIS — Z7901 Long term (current) use of anticoagulants: Secondary | ICD-10-CM | POA: Diagnosis not present

## 2022-04-16 DIAGNOSIS — Z7901 Long term (current) use of anticoagulants: Secondary | ICD-10-CM | POA: Diagnosis not present

## 2022-04-27 DIAGNOSIS — Z7901 Long term (current) use of anticoagulants: Secondary | ICD-10-CM | POA: Diagnosis not present

## 2022-04-29 DIAGNOSIS — J4520 Mild intermittent asthma, uncomplicated: Secondary | ICD-10-CM | POA: Diagnosis not present

## 2022-04-29 DIAGNOSIS — I82401 Acute embolism and thrombosis of unspecified deep veins of right lower extremity: Secondary | ICD-10-CM | POA: Diagnosis not present

## 2022-04-29 DIAGNOSIS — I10 Essential (primary) hypertension: Secondary | ICD-10-CM | POA: Diagnosis not present

## 2022-05-12 DIAGNOSIS — Z7901 Long term (current) use of anticoagulants: Secondary | ICD-10-CM | POA: Diagnosis not present

## 2022-05-19 DIAGNOSIS — Z7901 Long term (current) use of anticoagulants: Secondary | ICD-10-CM | POA: Diagnosis not present

## 2022-05-26 DIAGNOSIS — Z0000 Encounter for general adult medical examination without abnormal findings: Secondary | ICD-10-CM | POA: Diagnosis not present

## 2022-05-27 ENCOUNTER — Encounter: Payer: Self-pay | Admitting: Orthopaedic Surgery

## 2022-05-27 ENCOUNTER — Ambulatory Visit (INDEPENDENT_AMBULATORY_CARE_PROVIDER_SITE_OTHER): Payer: Medicare Other | Admitting: Orthopaedic Surgery

## 2022-05-27 DIAGNOSIS — Z7901 Long term (current) use of anticoagulants: Secondary | ICD-10-CM

## 2022-05-27 DIAGNOSIS — M25562 Pain in left knee: Secondary | ICD-10-CM

## 2022-05-27 DIAGNOSIS — G8929 Other chronic pain: Secondary | ICD-10-CM | POA: Diagnosis not present

## 2022-05-27 MED ORDER — METHYLPREDNISOLONE ACETATE 40 MG/ML IJ SUSP
40.0000 mg | Freq: Once | INTRAMUSCULAR | Status: AC
  Administered 2022-05-27: 40 mg via INTRA_ARTICULAR

## 2022-05-27 NOTE — Progress Notes (Signed)
PROCEDURE NOTE:  The patient requests injections of the left knee , verbal consent was obtained.  The left knee was prepped appropriately after time out was performed.   Sterile technique was observed and injection of 1 cc of DepoMedrol 40 mg with several cc's of plain xylocaine. Anesthesia was provided by ethyl chloride and a 20-gauge needle was used to inject the knee area. The injection was tolerated well.  A band aid dressing was applied.  The patient was advised to apply ice later today and tomorrow to the injection sight as needed.  Encounter Diagnoses  Name Primary?   Chronic pain of left knee Yes   Anticoagulant long-term use    Return prn.  Call if any problem.  Precautions discussed.  Electronically Signed Sanjuana Kava, MD 2/28/202410:40 AM

## 2022-06-02 DIAGNOSIS — Z7901 Long term (current) use of anticoagulants: Secondary | ICD-10-CM | POA: Diagnosis not present

## 2022-06-02 DIAGNOSIS — Z1159 Encounter for screening for other viral diseases: Secondary | ICD-10-CM | POA: Diagnosis not present

## 2022-06-30 DIAGNOSIS — Z79899 Other long term (current) drug therapy: Secondary | ICD-10-CM | POA: Diagnosis not present

## 2022-06-30 DIAGNOSIS — H35372 Puckering of macula, left eye: Secondary | ICD-10-CM | POA: Diagnosis not present

## 2022-06-30 DIAGNOSIS — H4311 Vitreous hemorrhage, right eye: Secondary | ICD-10-CM | POA: Diagnosis not present

## 2022-06-30 DIAGNOSIS — H30031 Focal chorioretinal inflammation, peripheral, right eye: Secondary | ICD-10-CM | POA: Diagnosis not present

## 2022-06-30 DIAGNOSIS — T8522XD Displacement of intraocular lens, subsequent encounter: Secondary | ICD-10-CM | POA: Diagnosis not present

## 2022-07-01 DIAGNOSIS — Z7901 Long term (current) use of anticoagulants: Secondary | ICD-10-CM | POA: Diagnosis not present

## 2022-08-04 DIAGNOSIS — Z7901 Long term (current) use of anticoagulants: Secondary | ICD-10-CM | POA: Diagnosis not present

## 2022-08-18 DIAGNOSIS — H30031 Focal chorioretinal inflammation, peripheral, right eye: Secondary | ICD-10-CM | POA: Diagnosis not present

## 2022-08-18 DIAGNOSIS — Z961 Presence of intraocular lens: Secondary | ICD-10-CM | POA: Diagnosis not present

## 2022-08-18 DIAGNOSIS — H35372 Puckering of macula, left eye: Secondary | ICD-10-CM | POA: Diagnosis not present

## 2022-08-18 DIAGNOSIS — H3581 Retinal edema: Secondary | ICD-10-CM | POA: Diagnosis not present

## 2022-09-02 DIAGNOSIS — Z7901 Long term (current) use of anticoagulants: Secondary | ICD-10-CM | POA: Diagnosis not present

## 2022-10-07 DIAGNOSIS — Z7901 Long term (current) use of anticoagulants: Secondary | ICD-10-CM | POA: Diagnosis not present

## 2022-10-22 DIAGNOSIS — J45901 Unspecified asthma with (acute) exacerbation: Secondary | ICD-10-CM | POA: Diagnosis not present

## 2022-10-22 DIAGNOSIS — E785 Hyperlipidemia, unspecified: Secondary | ICD-10-CM | POA: Diagnosis not present

## 2022-10-22 DIAGNOSIS — I10 Essential (primary) hypertension: Secondary | ICD-10-CM | POA: Diagnosis not present

## 2022-10-22 DIAGNOSIS — Z79899 Other long term (current) drug therapy: Secondary | ICD-10-CM | POA: Diagnosis not present

## 2022-10-22 DIAGNOSIS — Z7901 Long term (current) use of anticoagulants: Secondary | ICD-10-CM | POA: Diagnosis not present

## 2022-10-22 DIAGNOSIS — N1831 Chronic kidney disease, stage 3a: Secondary | ICD-10-CM | POA: Diagnosis not present

## 2022-10-22 DIAGNOSIS — C8590 Non-Hodgkin lymphoma, unspecified, unspecified site: Secondary | ICD-10-CM | POA: Diagnosis not present

## 2022-10-29 ENCOUNTER — Other Ambulatory Visit (HOSPITAL_COMMUNITY): Payer: Self-pay | Admitting: Internal Medicine

## 2022-10-29 DIAGNOSIS — M109 Gout, unspecified: Secondary | ICD-10-CM | POA: Diagnosis not present

## 2022-10-29 DIAGNOSIS — E785 Hyperlipidemia, unspecified: Secondary | ICD-10-CM | POA: Diagnosis not present

## 2022-10-29 DIAGNOSIS — M542 Cervicalgia: Secondary | ICD-10-CM

## 2022-10-29 DIAGNOSIS — R519 Headache, unspecified: Secondary | ICD-10-CM

## 2022-10-29 DIAGNOSIS — Z0000 Encounter for general adult medical examination without abnormal findings: Secondary | ICD-10-CM | POA: Diagnosis not present

## 2022-10-29 DIAGNOSIS — I82401 Acute embolism and thrombosis of unspecified deep veins of right lower extremity: Secondary | ICD-10-CM | POA: Diagnosis not present

## 2022-10-29 DIAGNOSIS — I10 Essential (primary) hypertension: Secondary | ICD-10-CM | POA: Diagnosis not present

## 2022-11-03 DIAGNOSIS — T8522XD Displacement of intraocular lens, subsequent encounter: Secondary | ICD-10-CM | POA: Diagnosis not present

## 2022-11-03 DIAGNOSIS — H30031 Focal chorioretinal inflammation, peripheral, right eye: Secondary | ICD-10-CM | POA: Diagnosis not present

## 2022-11-03 DIAGNOSIS — H35372 Puckering of macula, left eye: Secondary | ICD-10-CM | POA: Diagnosis not present

## 2022-11-03 DIAGNOSIS — H4311 Vitreous hemorrhage, right eye: Secondary | ICD-10-CM | POA: Diagnosis not present

## 2022-11-11 ENCOUNTER — Ambulatory Visit (HOSPITAL_COMMUNITY)
Admission: RE | Admit: 2022-11-11 | Discharge: 2022-11-11 | Disposition: A | Discharge status: Home / Self Care | Payer: Medicare Other | Source: Ambulatory Visit | Attending: Internal Medicine | Admitting: Internal Medicine

## 2022-11-11 ENCOUNTER — Encounter (HOSPITAL_COMMUNITY): Payer: Self-pay

## 2022-11-11 DIAGNOSIS — M5021 Other cervical disc displacement,  high cervical region: Secondary | ICD-10-CM | POA: Diagnosis not present

## 2022-11-11 DIAGNOSIS — R519 Headache, unspecified: Secondary | ICD-10-CM

## 2022-11-11 DIAGNOSIS — M47811 Spondylosis without myelopathy or radiculopathy, occipito-atlanto-axial region: Secondary | ICD-10-CM | POA: Diagnosis not present

## 2022-11-11 DIAGNOSIS — M4802 Spinal stenosis, cervical region: Secondary | ICD-10-CM | POA: Diagnosis not present

## 2022-11-11 DIAGNOSIS — M47812 Spondylosis without myelopathy or radiculopathy, cervical region: Secondary | ICD-10-CM | POA: Diagnosis not present

## 2022-11-11 DIAGNOSIS — M542 Cervicalgia: Secondary | ICD-10-CM | POA: Insufficient documentation

## 2022-11-16 NOTE — Progress Notes (Signed)
History of Present Illness: Nancy Hicks is a 89 y.o. year old female here for f/u of recurrent cystitis. Last sen ~2 years ago for same. She has been on suppression w/ methenamine 500 mg bid.  Past Medical History:  Diagnosis Date   Anemia, unspecified    Anxiety state, unspecified    Blood dyscrasia    RV factor def   Cancer (HCC)    skin   Degeneration of cervical intervertebral disc    Disorder of bone and cartilage, unspecified    Diverticulosis of colon (without mention of hemorrhage) 01/13/1999   Esophageal reflux    Esophageal stricture    Factor V deficiency (HCC)    Factor V deficiency (HCC)    Hiatal hernia 04/09/2010   ? 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 Sheryn Bison: moderate to severe sigmoid diverticulosis   COLONOSCOPY  03/2015   Dr. Gibson Ramp at [facility]: TCS for abnormal sigmoid colon on PET. Moderate diverticulosis of the sigmoid colon, sessile 8-9 mm sigmoid: Polyp biopsied (given anticoagulation), two 3 mm ICV polyps removed. All polyps were tubular adenomas.   ESOPHAGEAL MANOMETRY  10/26/2011   Procedure: ESOPHAGEAL MANOMETRY (EM);  Surgeon: Mardella Layman, MD;  Location: [facility];  Service: Endoscopy;  Laterality: N/A;   ESOPHAGOGASTRODUODENOSCOPY  03/2010   Dr. Sheryn Bison: candida esophagitis, prolapsing hh with cameron lesions, multiple polyps   EYE SURGERY Bilateral    cataracts   Flexible sigmoidoscopy  05/2015   Dr. Gibson Ramp at [facility]: Performed off anticoagulation. 1-1.5 cm sessile sigmoid polyp removed, site tattooed. Pathology revealed tubular adenoma. Colonoscopy in 3-5 years as medically appropriate.   PAROTIDECTOMY Left 11/14/2014   PAROTIDECTOMY Left 11/14/2014   Procedure: LEFT LATERAL PAROTIDECTOMY;  Surgeon: Newman Pies, MD;  Location: [facility] OR;  Service: ENT;  Laterality: Left;   PARS PLANA VITRECTOMY Right 04/15/2018   Procedure: PARS PLANA VITRECTOMY WITH 25 GAUGE;  Surgeon: Rennis Chris, MD;  Location: [facility] OR;  Service: Ophthalmology;  Laterality: Right;   PHOTOCOAGULATION WITH LASER Right 04/15/2018   Procedure: PHOTOCOAGULATION WITH LASER;  Surgeon: Rennis Chris, MD;  Location: [facility] OR;  Service: Ophthalmology;  Laterality: Right;   salivery glad tumor  80's   right   TONSILLECTOMY      Home Medications:  (Not in a hospital admission)   Allergies:  Allergies  Allergen Reactions   Polymyxin B Swelling    Redness, swelling, discharge.    Atropine Itching    "EYEDROPS".   Cephalexin Dermatitis   Metoclopramide Hcl Other (See Comments)    REACTION: causes uncontrolable leg movements   Nitrofurantoin Nausea And Vomiting and Other (See Comments)    REACTION: dizziness    Family History  Problem Relation Age of Onset  Parkinsonism Mother    COPD Brother    Asthma Brother    Lung cancer Brother    Heart attack Brother    Diabetes Paternal Aunt     Social History:  reports that she has never smoked. She has never used smokeless tobacco. She reports that she does not drink alcohol and does not use drugs.  ROS: A complete review of systems was performed.  All systems are negative except for pertinent findings as noted.  Physical Exam:  Vital signs in last 24 hours: @VSRANGES @ General:  Alert and oriented, No acute  distress HEENT: Normocephalic, atraumatic Neck: No JVD or lymphadenopathy Cardiovascular: Regular rate  Lungs: Normal inspiratory/expiratory excursion Abdomen: Soft, nontender, nondistended, no abdominal masses Back: No CVA tenderness Extremities: No edema Neurologic: Grossly intact  I have reviewed prior pt notes  I have reviewed notes from referring/previous physicians  I have reviewed urinalysis results  I have independently reviewed prior imaging  I have reviewed prior urine culture   Impression/Assessment:  ***  Plan:  ***  Bertram Millard Pheobe Sandiford 11/16/2022, 12:35 PM  Bertram Millard. Freddi Schrager MD

## 2022-11-17 ENCOUNTER — Other Ambulatory Visit: Payer: Self-pay

## 2022-11-17 ENCOUNTER — Ambulatory Visit (INDEPENDENT_AMBULATORY_CARE_PROVIDER_SITE_OTHER): Payer: Medicare Other | Admitting: Urology

## 2022-11-17 ENCOUNTER — Encounter: Payer: Self-pay | Admitting: Urology

## 2022-11-17 ENCOUNTER — Other Ambulatory Visit: Payer: Self-pay | Admitting: Urology

## 2022-11-17 VITALS — BP 137/61 | HR 83

## 2022-11-17 DIAGNOSIS — Z09 Encounter for follow-up examination after completed treatment for conditions other than malignant neoplasm: Secondary | ICD-10-CM

## 2022-11-17 DIAGNOSIS — N3020 Other chronic cystitis without hematuria: Secondary | ICD-10-CM | POA: Diagnosis not present

## 2022-11-17 DIAGNOSIS — Z7901 Long term (current) use of anticoagulants: Secondary | ICD-10-CM | POA: Diagnosis not present

## 2022-11-17 DIAGNOSIS — Z87440 Personal history of urinary (tract) infections: Secondary | ICD-10-CM | POA: Diagnosis not present

## 2022-11-17 LAB — MICROSCOPIC EXAMINATION: Bacteria, UA: NONE SEEN

## 2022-11-17 LAB — URINALYSIS, ROUTINE W REFLEX MICROSCOPIC
Bilirubin, UA: NEGATIVE
Glucose, UA: NEGATIVE
Ketones, UA: NEGATIVE
Nitrite, UA: NEGATIVE
Protein,UA: NEGATIVE
RBC, UA: NEGATIVE
Specific Gravity, UA: 1.015 (ref 1.005–1.030)
Urobilinogen, Ur: 0.2 mg/dL (ref 0.2–1.0)
pH, UA: 6 (ref 5.0–7.5)

## 2022-11-17 MED ORDER — METHENAMINE HIPPURATE 1 G PO TABS: ORAL_TABLET | ORAL | 3 refills | Status: DC

## 2022-11-23 ENCOUNTER — Telehealth: Payer: Self-pay

## 2022-11-23 DIAGNOSIS — N3020 Other chronic cystitis without hematuria: Secondary | ICD-10-CM

## 2022-11-23 MED ORDER — METHENAMINE HIPPURATE 1 G PO TABS: ORAL_TABLET | ORAL | 3 refills | Status: DC

## 2022-11-23 NOTE — Telephone Encounter (Signed)
-----   Message from [facility] Bison B sent at 11/23/2022  9:49 AM EDT -----  ----- Message ----- From: Marcine Matar, MD Sent: 11/19/2022  10:43 AM EDT To: Grier Rocher, CMA  I was having trouble refilling pt's methenamine d/t unusual mail order pharmacy request by pt. Can you ask her if she has received rf yet?

## 2022-11-23 NOTE — Telephone Encounter (Signed)
Return call to patient to confirm medication. Patient states medication is supposed to go to mail pharmacy. Patient is aware I will contact mail order to confirmed prescription.

## 2022-12-01 ENCOUNTER — Ambulatory Visit (INDEPENDENT_AMBULATORY_CARE_PROVIDER_SITE_OTHER): Payer: Medicare Other | Admitting: Orthopaedic Surgery

## 2022-12-01 ENCOUNTER — Encounter: Payer: Self-pay | Admitting: Orthopaedic Surgery

## 2022-12-01 DIAGNOSIS — M25561 Pain in right knee: Secondary | ICD-10-CM | POA: Diagnosis not present

## 2022-12-01 DIAGNOSIS — G8929 Other chronic pain: Secondary | ICD-10-CM

## 2022-12-01 DIAGNOSIS — M25562 Pain in left knee: Secondary | ICD-10-CM | POA: Diagnosis not present

## 2022-12-01 MED ORDER — METHYLPREDNISOLONE ACETATE 40 MG/ML IJ SUSP
40.0000 mg | Freq: Once | INTRAMUSCULAR | Status: AC
  Administered 2022-12-01: 40 mg via INTRA_ARTICULAR

## 2022-12-01 NOTE — Progress Notes (Signed)
PROCEDURE NOTE:  The patient requests injections of the right knee , verbal consent was obtained.  The right knee was prepped appropriately after time out was performed.   Sterile technique was observed and injection of 1 cc of DepoMedrol 40mg  with several cc's of plain xylocaine. Anesthesia was provided by ethyl chloride and a 20-gauge needle was used to inject the knee area. The injection was tolerated well.  A band aid dressing was applied.  The patient was advised to apply ice later today and tomorrow to the injection sight as needed.  PROCEDURE NOTE:  The patient requests injections of the left knee , verbal consent was obtained.  The left knee was prepped appropriately after time out was performed.   Sterile technique was observed and injection of 1 cc of DepoMedrol 40 mg with several cc's of plain xylocaine. Anesthesia was provided by ethyl chloride and a 20-gauge needle was used to inject the knee area. The injection was tolerated well.  A band aid dressing was applied.  The patient was advised to apply ice later today and tomorrow to the injection sight as needed.  Encounter Diagnoses  Name Primary?   Chronic pain of left knee Yes   Chronic pain of right knee    Return prn.  Call if any problem.  Precautions discussed.  Electronically Signed Darreld Mclean, MD 9/3/202410:04 AM

## 2022-12-03 NOTE — Telephone Encounter (Signed)
-----   Message from [facility] Bison B sent at 11/23/2022  9:49 AM EDT -----  ----- Message ----- From: Marcine Matar, MD Sent: 11/19/2022  10:43 AM EDT To: Grier Rocher, CMA  I was having trouble refilling pt's methenamine d/t unusual mail order pharmacy request by pt. Can you ask her if she has received rf yet?

## 2022-12-03 NOTE — Telephone Encounter (Signed)
Follow up with patient about Rx methenamine with patient. Patient states she has her Rx and everything is all good. Voiced understanding

## 2022-12-15 DIAGNOSIS — Z7901 Long term (current) use of anticoagulants: Secondary | ICD-10-CM | POA: Diagnosis not present

## 2022-12-30 DIAGNOSIS — Z7901 Long term (current) use of anticoagulants: Secondary | ICD-10-CM | POA: Diagnosis not present

## 2023-01-13 DIAGNOSIS — Z7901 Long term (current) use of anticoagulants: Secondary | ICD-10-CM | POA: Diagnosis not present

## 2023-01-26 DIAGNOSIS — H4311 Vitreous hemorrhage, right eye: Secondary | ICD-10-CM | POA: Diagnosis not present

## 2023-01-26 DIAGNOSIS — H30031 Focal chorioretinal inflammation, peripheral, right eye: Secondary | ICD-10-CM | POA: Diagnosis not present

## 2023-01-26 DIAGNOSIS — T8522XD Displacement of intraocular lens, subsequent encounter: Secondary | ICD-10-CM | POA: Diagnosis not present

## 2023-01-26 DIAGNOSIS — H35372 Puckering of macula, left eye: Secondary | ICD-10-CM | POA: Diagnosis not present

## 2023-01-28 DIAGNOSIS — Z23 Encounter for immunization: Secondary | ICD-10-CM | POA: Diagnosis not present

## 2023-01-28 DIAGNOSIS — Z7901 Long term (current) use of anticoagulants: Secondary | ICD-10-CM | POA: Diagnosis not present

## 2023-02-15 DIAGNOSIS — Z7901 Long term (current) use of anticoagulants: Secondary | ICD-10-CM | POA: Diagnosis not present

## 2023-03-16 DIAGNOSIS — Z7901 Long term (current) use of anticoagulants: Secondary | ICD-10-CM | POA: Diagnosis not present

## 2023-04-05 ENCOUNTER — Telehealth: Payer: Self-pay

## 2023-04-05 DIAGNOSIS — N3020 Other chronic cystitis without hematuria: Secondary | ICD-10-CM

## 2023-04-05 MED ORDER — METHENAMINE HIPPURATE 1 G PO TABS: ORAL_TABLET | ORAL | 3 refills | Status: AC

## 2023-04-05 NOTE — Telephone Encounter (Signed)
 Pt needs a new script/ refill for methenamine sent [facility] mail order [phone]

## 2023-04-14 DIAGNOSIS — Z7901 Long term (current) use of anticoagulants: Secondary | ICD-10-CM | POA: Diagnosis not present

## 2023-05-03 DIAGNOSIS — Z86718 Personal history of other venous thrombosis and embolism: Secondary | ICD-10-CM | POA: Diagnosis not present

## 2023-05-03 DIAGNOSIS — I10 Essential (primary) hypertension: Secondary | ICD-10-CM | POA: Diagnosis not present

## 2023-05-03 DIAGNOSIS — Z8572 Personal history of non-Hodgkin lymphomas: Secondary | ICD-10-CM | POA: Diagnosis not present

## 2023-05-03 DIAGNOSIS — Z86711 Personal history of pulmonary embolism: Secondary | ICD-10-CM | POA: Diagnosis not present

## 2023-05-12 DIAGNOSIS — Z7901 Long term (current) use of anticoagulants: Secondary | ICD-10-CM | POA: Diagnosis not present

## 2023-05-26 ENCOUNTER — Ambulatory Visit (INDEPENDENT_AMBULATORY_CARE_PROVIDER_SITE_OTHER): Payer: Medicare HMO | Admitting: Orthopaedic Surgery

## 2023-05-26 ENCOUNTER — Encounter: Payer: Self-pay | Admitting: Orthopaedic Surgery

## 2023-05-26 DIAGNOSIS — M25562 Pain in left knee: Secondary | ICD-10-CM

## 2023-05-26 DIAGNOSIS — G8929 Other chronic pain: Secondary | ICD-10-CM | POA: Diagnosis not present

## 2023-05-26 DIAGNOSIS — M25561 Pain in right knee: Secondary | ICD-10-CM | POA: Diagnosis not present

## 2023-05-26 MED ORDER — METHYLPREDNISOLONE ACETATE 40 MG/ML IJ SUSP
40.0000 mg | Freq: Once | INTRAMUSCULAR | Status: AC
  Administered 2023-05-26: 40 mg via INTRA_ARTICULAR

## 2023-05-26 NOTE — Progress Notes (Signed)
 PROCEDURE NOTE:  The patient requests injections of the right knee , verbal consent was obtained.  The right knee was prepped appropriately after time out was performed.   Sterile technique was observed and injection of 1 cc of DepoMedrol 40mg  with several cc's of plain xylocaine. Anesthesia was provided by ethyl chloride and a 20-gauge needle was used to inject the knee area. The injection was tolerated well.  A band aid dressing was applied.  The patient was advised to apply ice later today and tomorrow to the injection sight as needed.  PROCEDURE NOTE:  The patient requests injections of the left knee , verbal consent was obtained.  The left knee was prepped appropriately after time out was performed.   Sterile technique was observed and injection of 1 cc of DepoMedrol 40 mg with several cc's of plain xylocaine. Anesthesia was provided by ethyl chloride and a 20-gauge needle was used to inject the knee area. The injection was tolerated well.  A band aid dressing was applied.  The patient was advised to apply ice later today and tomorrow to the injection sight as needed.  Encounter Diagnoses  Name Primary?   Chronic pain of left knee Yes   Chronic pain of right knee    Return prn.  Call if any problem.  Precautions discussed.  Electronically Signed Darreld Mclean, MD 2/26/20259:39 AM

## 2023-05-26 NOTE — Addendum Note (Signed)
 Addended by: Michaele Offer on: 05/26/2023 02:42 PM   Modules accepted: Orders

## 2023-06-09 DIAGNOSIS — Z7901 Long term (current) use of anticoagulants: Secondary | ICD-10-CM | POA: Diagnosis not present

## 2023-06-29 DIAGNOSIS — H3581 Retinal edema: Secondary | ICD-10-CM | POA: Diagnosis not present

## 2023-06-29 DIAGNOSIS — Z79899 Other long term (current) drug therapy: Secondary | ICD-10-CM | POA: Diagnosis not present

## 2023-06-29 DIAGNOSIS — H4311 Vitreous hemorrhage, right eye: Secondary | ICD-10-CM | POA: Diagnosis not present

## 2023-06-29 DIAGNOSIS — H30031 Focal chorioretinal inflammation, peripheral, right eye: Secondary | ICD-10-CM | POA: Diagnosis not present

## 2023-06-29 DIAGNOSIS — H35372 Puckering of macula, left eye: Secondary | ICD-10-CM | POA: Diagnosis not present

## 2023-06-29 DIAGNOSIS — T8522XD Displacement of intraocular lens, subsequent encounter: Secondary | ICD-10-CM | POA: Diagnosis not present

## 2023-06-29 DIAGNOSIS — Z961 Presence of intraocular lens: Secondary | ICD-10-CM | POA: Diagnosis not present

## 2023-07-07 DIAGNOSIS — Z7901 Long term (current) use of anticoagulants: Secondary | ICD-10-CM | POA: Diagnosis not present

## 2023-08-04 DIAGNOSIS — Z7901 Long term (current) use of anticoagulants: Secondary | ICD-10-CM | POA: Diagnosis not present

## 2023-08-11 DIAGNOSIS — Z7901 Long term (current) use of anticoagulants: Secondary | ICD-10-CM | POA: Diagnosis not present

## 2023-08-25 DIAGNOSIS — Z7901 Long term (current) use of anticoagulants: Secondary | ICD-10-CM | POA: Diagnosis not present

## 2023-09-28 DIAGNOSIS — Z7901 Long term (current) use of anticoagulants: Secondary | ICD-10-CM | POA: Diagnosis not present

## 2023-10-06 ENCOUNTER — Encounter: Payer: Self-pay | Admitting: Orthopaedic Surgery

## 2023-10-06 ENCOUNTER — Other Ambulatory Visit (INDEPENDENT_AMBULATORY_CARE_PROVIDER_SITE_OTHER)

## 2023-10-06 ENCOUNTER — Ambulatory Visit (INDEPENDENT_AMBULATORY_CARE_PROVIDER_SITE_OTHER): Admitting: Orthopaedic Surgery

## 2023-10-06 VITALS — BP 156/69 | HR 78

## 2023-10-06 DIAGNOSIS — R2689 Other abnormalities of gait and mobility: Secondary | ICD-10-CM | POA: Diagnosis not present

## 2023-10-06 DIAGNOSIS — M25562 Pain in left knee: Secondary | ICD-10-CM

## 2023-10-06 DIAGNOSIS — M25512 Pain in left shoulder: Secondary | ICD-10-CM

## 2023-10-06 DIAGNOSIS — M25561 Pain in right knee: Secondary | ICD-10-CM

## 2023-10-06 DIAGNOSIS — G8929 Other chronic pain: Secondary | ICD-10-CM | POA: Diagnosis not present

## 2023-10-06 DIAGNOSIS — Z7901 Long term (current) use of anticoagulants: Secondary | ICD-10-CM

## 2023-10-06 MED ORDER — METHYLPREDNISOLONE ACETATE 40 MG/ML IJ SUSP
40.0000 mg | Freq: Once | INTRAMUSCULAR | Status: AC
  Administered 2023-10-06: 40 mg via INTRA_ARTICULAR

## 2023-10-06 NOTE — Addendum Note (Signed)
 Addended by: VICENTA EMMIE HERO on: 10/06/2023 11:08 AM   Modules accepted: Orders

## 2023-10-06 NOTE — Progress Notes (Signed)
 I fell.  She fell the other day and hurt her left shoulder.  She has bruising and pain.  She has no loss of consciousness.  She has had several falls over the last year related to balance issues.  She has a Comptroller.  Left shoulder is painful to move but moves well with some limitation.  NV intact.  No redness.  There is some bruising.  She is on a blood thinner.  ROM of the neck is full.  She is alert and oriented.  Both knees have crepitus, effusion, ROM right 0 to 95, left 0 to 100, NV intact, no distal edema.  XRays were done of the left shoulder, reported separately.  Encounter Diagnoses  Name Primary?   Acute pain of left shoulder Yes   Chronic pain of left knee    Chronic pain of right knee    Anticoagulant long-term use    Balance problem     PROCEDURE NOTE:  The patient requests injections of the right knee , verbal consent was obtained.  The right knee was prepped appropriately after time out was performed.   Sterile technique was observed and injection of 1 cc of DepoMedrol 40mg  with several cc's of plain xylocaine . Anesthesia was provided by ethyl chloride and a 20-gauge needle was used to inject the knee area. The injection was tolerated well.  A band aid dressing was applied.  The patient was advised to apply ice later today and tomorrow to the injection sight as needed.  PROCEDURE NOTE:  The patient requests injections of the left knee , verbal consent was obtained.  The left knee was prepped appropriately after time out was performed.   Sterile technique was observed and injection of 1 cc of DepoMedrol 40 mg with several cc's of plain xylocaine . Anesthesia was provided by ethyl chloride and a 20-gauge needle was used to inject the knee area. The injection was tolerated well.  A band aid dressing was applied.  The patient was advised to apply ice later today and tomorrow to the injection sight as needed.  Return prn.  Call if any problem.  Precautions  discussed.  Electronically Signed Lemond Stable, MD 7/9/202510:26 AM

## 2023-10-26 DIAGNOSIS — E785 Hyperlipidemia, unspecified: Secondary | ICD-10-CM | POA: Diagnosis not present

## 2023-10-26 DIAGNOSIS — M1000 Idiopathic gout, unspecified site: Secondary | ICD-10-CM | POA: Diagnosis not present

## 2023-10-26 DIAGNOSIS — Z7901 Long term (current) use of anticoagulants: Secondary | ICD-10-CM | POA: Diagnosis not present

## 2023-10-26 DIAGNOSIS — Z79899 Other long term (current) drug therapy: Secondary | ICD-10-CM | POA: Diagnosis not present

## 2023-10-26 DIAGNOSIS — I10 Essential (primary) hypertension: Secondary | ICD-10-CM | POA: Diagnosis not present

## 2023-10-26 DIAGNOSIS — D649 Anemia, unspecified: Secondary | ICD-10-CM | POA: Diagnosis not present

## 2023-10-26 DIAGNOSIS — J45901 Unspecified asthma with (acute) exacerbation: Secondary | ICD-10-CM | POA: Diagnosis not present

## 2023-10-26 DIAGNOSIS — D689 Coagulation defect, unspecified: Secondary | ICD-10-CM | POA: Diagnosis not present

## 2023-11-11 DIAGNOSIS — N1831 Chronic kidney disease, stage 3a: Secondary | ICD-10-CM | POA: Diagnosis not present

## 2023-11-11 DIAGNOSIS — Z0001 Encounter for general adult medical examination with abnormal findings: Secondary | ICD-10-CM | POA: Diagnosis not present

## 2023-11-11 DIAGNOSIS — I82409 Acute embolism and thrombosis of unspecified deep veins of unspecified lower extremity: Secondary | ICD-10-CM | POA: Diagnosis not present

## 2023-11-11 DIAGNOSIS — Z8572 Personal history of non-Hodgkin lymphomas: Secondary | ICD-10-CM | POA: Diagnosis not present

## 2023-11-11 DIAGNOSIS — E785 Hyperlipidemia, unspecified: Secondary | ICD-10-CM | POA: Diagnosis not present

## 2023-11-11 DIAGNOSIS — Z8709 Personal history of other diseases of the respiratory system: Secondary | ICD-10-CM | POA: Diagnosis not present

## 2023-11-11 DIAGNOSIS — F320 Major depressive disorder, single episode, mild: Secondary | ICD-10-CM | POA: Diagnosis not present

## 2023-11-11 DIAGNOSIS — I10 Essential (primary) hypertension: Secondary | ICD-10-CM | POA: Diagnosis not present

## 2023-11-24 DIAGNOSIS — Z7901 Long term (current) use of anticoagulants: Secondary | ICD-10-CM | POA: Diagnosis not present

## 2023-11-24 DIAGNOSIS — D7589 Other specified diseases of blood and blood-forming organs: Secondary | ICD-10-CM | POA: Diagnosis not present

## 2023-12-21 DIAGNOSIS — D7589 Other specified diseases of blood and blood-forming organs: Secondary | ICD-10-CM | POA: Diagnosis not present

## 2023-12-21 DIAGNOSIS — Z7901 Long term (current) use of anticoagulants: Secondary | ICD-10-CM | POA: Diagnosis not present

## 2023-12-29 DIAGNOSIS — Z23 Encounter for immunization: Secondary | ICD-10-CM | POA: Diagnosis not present

## 2024-01-19 DIAGNOSIS — D7589 Other specified diseases of blood and blood-forming organs: Secondary | ICD-10-CM | POA: Diagnosis not present

## 2024-01-19 DIAGNOSIS — Z7901 Long term (current) use of anticoagulants: Secondary | ICD-10-CM | POA: Diagnosis not present

## 2024-01-26 ENCOUNTER — Encounter: Payer: Self-pay | Admitting: Orthopedic Surgery

## 2024-01-26 ENCOUNTER — Ambulatory Visit (INDEPENDENT_AMBULATORY_CARE_PROVIDER_SITE_OTHER): Admitting: Orthopedic Surgery

## 2024-01-26 DIAGNOSIS — M170 Bilateral primary osteoarthritis of knee: Secondary | ICD-10-CM

## 2024-01-26 DIAGNOSIS — G8929 Other chronic pain: Secondary | ICD-10-CM

## 2024-01-26 NOTE — Progress Notes (Signed)
 Orthopaedic Clinic Return  Assessment: Nancy Hicks is a 89 y.o. female with the following: Bilateral knee arthritis   Plan: [name] has known arthritis in bilateral knees.  Dr. Brenna has injected both knees for several years.  She is not a good candidate for surgery.  She is interested in repeat injections today.  These were completed without issues.  She will contact the clinic when she is ready to proceed with more injections.  Procedure note injection Right knee joint   Verbal consent was obtained to inject the right knee joint  Timeout was completed to confirm the site of injection.  The skin was prepped with alcohol  and ethyl chloride was sprayed at the injection site.  A 21-gauge needle was used to inject 40 mg of Depo-Medrol  and 1% lidocaine  (4 cc) into the right knee using an anterolateral approach.  There were no complications. A sterile bandage was applied.   Procedure note injection Left knee joint   Verbal consent was obtained to inject the left knee joint  Timeout was completed to confirm the site of injection.  The skin was prepped with alcohol  and ethyl chloride was sprayed at the injection site.  A 21-gauge needle was used to inject 40 mg of Depo-Medrol  and 1% lidocaine  (4 cc) into the left knee using an anterolateral approach.  There were no complications. A sterile bandage was applied.     Follow-up: Return if symptoms worsen or fail to improve.   Subjective:  Chief Complaint  Patient presents with   Knee Pain    Bilateral -request injections    History of Present Illness: Nancy Hicks is a 89 y.o. female who returns to clinic for repeat evaluation of bilateral knee pain.  She has been followed by Dr. Brenna for years.  She gets injections on a regular basis.  She has been evaluated by Dr. Hiram but was deemed to be high risk candidate for surgery.  Therefore, she has been treated with multiple injections.  Is been several months  since she had injections last.  She states that she waited too long.  Review of Systems: No fevers or chills No numbness or tingling No chest pain No shortness of breath No bowel or bladder dysfunction No GI distress No headaches   Objective: There were no vitals taken for this visit.  Physical Exam:  Elderly female.  No acute distress.  Ambulates with a cane.  Bilateral knees without deformity.  Tenderness on the lateral joint line.  She has good range of motion.  IMAGING: I personally ordered and reviewed the following images:  No new imaging obtained today.  Oneil DELENA Horde, MD 01/26/2024 11:14 AM  3

## 2024-01-26 NOTE — Patient Instructions (Signed)

## 2024-02-15 DIAGNOSIS — Z7901 Long term (current) use of anticoagulants: Secondary | ICD-10-CM | POA: Diagnosis not present

## 2024-02-15 DIAGNOSIS — D7589 Other specified diseases of blood and blood-forming organs: Secondary | ICD-10-CM | POA: Diagnosis not present

## 2024-03-15 DIAGNOSIS — D7589 Other specified diseases of blood and blood-forming organs: Secondary | ICD-10-CM | POA: Diagnosis not present

## 2024-03-15 DIAGNOSIS — Z7901 Long term (current) use of anticoagulants: Secondary | ICD-10-CM | POA: Diagnosis not present

## 2024-05-19 ENCOUNTER — Ambulatory Visit: Admitting: Orthopedic Surgery
